# Patient Record
Sex: Female | Born: 1955
Health system: Southern US, Community
[De-identification: ages and names within clinical notes are randomized; demographics above are authoritative.]

## PROBLEM LIST (undated history)

## (undated) DIAGNOSIS — R112 Nausea with vomiting, unspecified: Secondary | ICD-10-CM

## (undated) DIAGNOSIS — Z9889 Other specified postprocedural states: Secondary | ICD-10-CM

## (undated) DIAGNOSIS — E559 Vitamin D deficiency, unspecified: Secondary | ICD-10-CM

## (undated) DIAGNOSIS — N393 Stress incontinence (female) (male): Secondary | ICD-10-CM

## (undated) DIAGNOSIS — J189 Pneumonia, unspecified organism: Secondary | ICD-10-CM

## (undated) DIAGNOSIS — F419 Anxiety disorder, unspecified: Secondary | ICD-10-CM

## (undated) DIAGNOSIS — Z8379 Family history of other diseases of the digestive system: Secondary | ICD-10-CM

## (undated) DIAGNOSIS — E785 Hyperlipidemia, unspecified: Secondary | ICD-10-CM

## (undated) DIAGNOSIS — Z923 Personal history of irradiation: Secondary | ICD-10-CM

## (undated) DIAGNOSIS — C801 Malignant (primary) neoplasm, unspecified: Secondary | ICD-10-CM

## (undated) DIAGNOSIS — Z853 Personal history of malignant neoplasm of breast: Secondary | ICD-10-CM

## (undated) HISTORY — DX: Hyperlipidemia, unspecified: E78.5

## (undated) HISTORY — DX: Anxiety disorder, unspecified: F41.9

## (undated) HISTORY — DX: Vitamin D deficiency, unspecified: E55.9

## (undated) HISTORY — DX: Malignant (primary) neoplasm, unspecified: C80.1

## (undated) HISTORY — PX: MENISCUS REPAIR: SHX5179

## (undated) HISTORY — DX: Pneumonia, unspecified organism: J18.9

## (undated) HISTORY — PX: BUNIONECTOMY: SHX129

---
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
## 2006-09-21 ENCOUNTER — Other Ambulatory Visit: Admission: RE | Admit: 2006-09-21 | Discharge: 2006-09-21 | Payer: Self-pay | Admitting: Family Medicine

## 2007-10-05 ENCOUNTER — Other Ambulatory Visit: Admission: RE | Admit: 2007-10-05 | Discharge: 2007-10-05 | Payer: Self-pay | Admitting: Family Medicine

## 2009-11-01 ENCOUNTER — Other Ambulatory Visit: Admission: RE | Admit: 2009-11-01 | Discharge: 2009-11-01 | Payer: Self-pay | Admitting: Family Medicine

## 2009-11-06 ENCOUNTER — Encounter: Admission: RE | Admit: 2009-11-06 | Discharge: 2009-11-06 | Payer: Self-pay | Admitting: Family Medicine

## 2009-12-02 ENCOUNTER — Encounter: Admission: RE | Admit: 2009-12-02 | Discharge: 2009-12-02 | Payer: Self-pay | Admitting: Family Medicine

## 2010-06-05 ENCOUNTER — Encounter: Admission: RE | Admit: 2010-06-05 | Discharge: 2010-06-05 | Payer: Self-pay | Admitting: Family Medicine

## 2010-08-17 DIAGNOSIS — Z923 Personal history of irradiation: Secondary | ICD-10-CM

## 2010-08-17 HISTORY — DX: Personal history of irradiation: Z92.3

## 2010-08-17 HISTORY — PX: BREAST LUMPECTOMY: SHX2

## 2010-11-11 ENCOUNTER — Other Ambulatory Visit: Payer: Self-pay | Admitting: Family Medicine

## 2010-11-11 DIAGNOSIS — R921 Mammographic calcification found on diagnostic imaging of breast: Secondary | ICD-10-CM

## 2010-11-13 ENCOUNTER — Ambulatory Visit
Admission: RE | Admit: 2010-11-13 | Discharge: 2010-11-13 | Disposition: A | Discharge status: Home / Self Care | Payer: 59 | Source: Ambulatory Visit | Attending: Family Medicine | Admitting: Family Medicine

## 2010-11-13 ENCOUNTER — Other Ambulatory Visit: Payer: Self-pay | Admitting: Family Medicine

## 2010-11-13 ENCOUNTER — Other Ambulatory Visit: Payer: Self-pay | Admitting: Diagnostic Radiology

## 2010-11-13 DIAGNOSIS — R921 Mammographic calcification found on diagnostic imaging of breast: Secondary | ICD-10-CM

## 2010-11-14 ENCOUNTER — Other Ambulatory Visit (HOSPITAL_COMMUNITY)
Admission: RE | Admit: 2010-11-14 | Discharge: 2010-11-14 | Disposition: A | Discharge status: Home / Self Care | Payer: Managed Care, Other (non HMO) | Source: Ambulatory Visit | Attending: Family Medicine | Admitting: Family Medicine

## 2010-11-14 ENCOUNTER — Other Ambulatory Visit: Payer: Self-pay | Admitting: Family Medicine

## 2010-11-14 ENCOUNTER — Other Ambulatory Visit: Payer: Self-pay | Admitting: Physician Assistant

## 2010-11-14 DIAGNOSIS — C50912 Malignant neoplasm of unspecified site of left female breast: Secondary | ICD-10-CM

## 2010-11-14 DIAGNOSIS — Z01419 Encounter for gynecological examination (general) (routine) without abnormal findings: Secondary | ICD-10-CM | POA: Insufficient documentation

## 2010-11-17 ENCOUNTER — Other Ambulatory Visit: Payer: Self-pay | Admitting: *Deleted

## 2010-11-17 DIAGNOSIS — C50912 Malignant neoplasm of unspecified site of left female breast: Secondary | ICD-10-CM

## 2010-11-18 ENCOUNTER — Other Ambulatory Visit (HOSPITAL_COMMUNITY): Payer: Self-pay | Admitting: General Surgery

## 2010-11-18 ENCOUNTER — Other Ambulatory Visit: Payer: Self-pay | Admitting: General Surgery

## 2010-11-18 DIAGNOSIS — C50912 Malignant neoplasm of unspecified site of left female breast: Secondary | ICD-10-CM

## 2010-11-19 ENCOUNTER — Ambulatory Visit
Admission: RE | Admit: 2010-11-19 | Discharge: 2010-11-19 | Disposition: A | Discharge status: Home / Self Care | Payer: 59 | Source: Ambulatory Visit | Attending: Family Medicine | Admitting: Family Medicine

## 2010-11-19 ENCOUNTER — Other Ambulatory Visit: Payer: Self-pay | Admitting: General Surgery

## 2010-11-19 DIAGNOSIS — C50912 Malignant neoplasm of unspecified site of left female breast: Secondary | ICD-10-CM

## 2010-11-19 MED ORDER — GADOBENATE DIMEGLUMINE 529 MG/ML IV SOLN
14.0000 mL | Freq: Once | INTRAVENOUS | Status: AC | PRN
  Administered 2010-11-19: 14 mL via INTRAVENOUS

## 2010-11-27 ENCOUNTER — Encounter (HOSPITAL_BASED_OUTPATIENT_CLINIC_OR_DEPARTMENT_OTHER)
Admission: RE | Admit: 2010-11-27 | Discharge: 2010-11-27 | Disposition: A | Discharge status: Home / Self Care | Payer: Managed Care, Other (non HMO) | Source: Ambulatory Visit | Attending: General Surgery | Admitting: General Surgery

## 2010-11-27 ENCOUNTER — Ambulatory Visit
Admission: RE | Admit: 2010-11-27 | Discharge: 2010-11-27 | Disposition: A | Discharge status: Home / Self Care | Payer: Managed Care, Other (non HMO) | Source: Ambulatory Visit | Attending: General Surgery | Admitting: General Surgery

## 2010-11-27 ENCOUNTER — Other Ambulatory Visit: Payer: Self-pay | Admitting: General Surgery

## 2010-11-27 DIAGNOSIS — Z01811 Encounter for preprocedural respiratory examination: Secondary | ICD-10-CM

## 2010-11-27 LAB — BASIC METABOLIC PANEL
Chloride: 106 mEq/L (ref 96–112)
GFR calc Af Amer: >60 mL/min (ref >60)
Potassium: 4.6 mEq/L (ref 3.5–5.1)
Sodium: 139 mEq/L (ref 135–145)

## 2010-11-27 LAB — CANCER ANTIGEN 27.29: CA 27.29: 9 U/mL (ref 0–39)

## 2010-12-02 ENCOUNTER — Ambulatory Visit
Admission: RE | Admit: 2010-12-02 | Discharge: 2010-12-02 | Disposition: A | Discharge status: Home / Self Care | Payer: Managed Care, Other (non HMO) | Source: Ambulatory Visit | Attending: General Surgery | Admitting: General Surgery

## 2010-12-02 ENCOUNTER — Other Ambulatory Visit: Payer: Self-pay | Admitting: General Surgery

## 2010-12-02 ENCOUNTER — Ambulatory Visit (HOSPITAL_BASED_OUTPATIENT_CLINIC_OR_DEPARTMENT_OTHER)
Admission: RE | Admit: 2010-12-02 | Discharge: 2010-12-02 | Disposition: A | Discharge status: Home / Self Care | Payer: Managed Care, Other (non HMO) | Source: Ambulatory Visit | Attending: General Surgery | Admitting: General Surgery

## 2010-12-02 ENCOUNTER — Ambulatory Visit (HOSPITAL_COMMUNITY)
Admission: RE | Admit: 2010-12-02 | Discharge: 2010-12-02 | Disposition: A | Discharge status: Home / Self Care | Payer: Managed Care, Other (non HMO) | Source: Ambulatory Visit | Attending: General Surgery | Admitting: General Surgery

## 2010-12-02 DIAGNOSIS — C50912 Malignant neoplasm of unspecified site of left female breast: Secondary | ICD-10-CM

## 2010-12-02 DIAGNOSIS — Z01812 Encounter for preprocedural laboratory examination: Secondary | ICD-10-CM | POA: Insufficient documentation

## 2010-12-02 DIAGNOSIS — C50919 Malignant neoplasm of unspecified site of unspecified female breast: Secondary | ICD-10-CM | POA: Insufficient documentation

## 2010-12-02 DIAGNOSIS — E119 Type 2 diabetes mellitus without complications: Secondary | ICD-10-CM | POA: Insufficient documentation

## 2010-12-02 DIAGNOSIS — D0590 Unspecified type of carcinoma in situ of unspecified breast: Secondary | ICD-10-CM | POA: Insufficient documentation

## 2010-12-02 HISTORY — PX: PARTIAL MASTECTOMY WITH AXILLARY SENTINEL LYMPH NODE BIOPSY: SHX6004

## 2010-12-02 LAB — GLUCOSE, CAPILLARY: Glucose-Capillary: 163 mg/dL — ABNORMAL HIGH (ref 70–99)

## 2010-12-02 MED ORDER — TECHNETIUM TC 99M SULFUR COLLOID FILTERED
1.0000 | Freq: Once | INTRAVENOUS | Status: AC | PRN
  Administered 2010-12-02: 1 via INTRADERMAL

## 2010-12-08 NOTE — Op Note (Signed)
NAMESOLE, Rebecca Robertson              ACCOUNT NO.:  [id number]  MEDICAL RECORD NO.:  [id number]           PATIENT TYPE:  O  LOCATION:  NUC                          FACILITY:  [facility]  PHYSICIAN:  Adolph Pollack, M.D.DATE OF BIRTH:  1955-12-04  DATE OF PROCEDURE:  12/02/2010 DATE OF DISCHARGE:                              OPERATIVE REPORT   PREOPERATIVE DIAGNOSIS:  High-grade ductal carcinoma in situ, left breast.  POSTOPERATIVE DIAGNOSIS:  High-grade ductal carcinoma in situ, left breast.  PROCEDURES: 1. Lymphatic mapping and injection of blue dye, left breast. 2. Left axillary sentinel lymph node biopsy (4 lymph nodes). 3. Left partial mastectomy after needle localization.  SURGEON:  Adolph Pollack, MD  ANESTHESIA:  General plus Marcaine local.  INDICATIONS:  Rebecca Robertson is a 55 year old female who had an abnormal screening mammogram demonstrating a suspicious cluster of microcalcifications in the left upper outer quadrant.  Stereotactic biopsy was performed and demonstrated high-grade ductal carcinoma in situ.  She now presents for the above procedure.  We discussed the procedure, risks, and aftercare preoperatively.  TECHNIQUE:  [facility] Mayford Knife and also had radioactive dye injection in the left nipple-areolar complex.  The left breast was marked with my initials. She was then brought to the operating room and placed supine on the operating table and general anesthetic was administered.  The bandage on the left breast was removed and the wire was cut closer to the skin.  I then sterilely prepped the periareolar area and injected 1-1.5 mL of methylene blue dye at 12, 3, 6, and 9 o'clock positions and massaged the breast for approximately 5 minutes.  Using a NeoProbe, I localized the site of increased counts and marked this with an X in the left axilla. Following this, the left breast, lateral chest  wall, axilla, and proximal left upper extremity were sterilely prepped and draped.  I made a curvilinear incision in the inferior axillary line on the left breast extending into the skin and subcutaneous tissue.  The clavipectoral fascia was divided.  I entered the axilla.  Using the NeoProbe, I identified the first lymph node that had increased counts and had blue dye and this was as sentinel lymph node #1.  This was dissected out bluntly and using electrocautery.  I then reinserted the NeoProbe into the left axilla and some area of increased counts using blunt dissection identified a second fairly large lymph node with some blue dye present and excised this with electrocautery as sentinel #2. Introduction of the NeoProbe into the left axilla demonstrated another area of increased counts and I dissected out a large node that was not blue but had an elevated radioactive counts and excised this using electrocautery and sent as sentinel #3.  I did find a final node also with elevated counts, excised with electrocautery and sent as sentinel lymph node #4.  Introduction of the NeoProbe into left axilla at this time demonstrated counts barely above background.  Following this, I inspected the wound and hemostasis was adequate. Local anesthetic was infiltrated superficially and deep consisting of 0.5% plain  Marcaine.  I then closed the wound 2 two layers.  The subcutaneous tissue was closed with interrupted 3-0 Vicryl sutures.  The skin was closed with 4-0 Monocryl subcuticular stitch.  Following this, I then approached the left breast and the wire.  The wire localization was done from the lateral aspect and I made a curvilinear incision in the lateral aspect of the left breast through the skin and subcutaneous tissue and then I brought the wire into the wound.  Using electrocautery, I performed a partial mastectomy taking a lump of tissue around the wire.  Once I had removed this, I  then oriented it with a single stitch anteriorly, 2 stitches superiorly, and the wire laterally.  Specimen mammogram was done demonstrating containment of the area of concern in the middle of the specimen.  This was confirmed by Dr. Mayford Knife.  All specimens were then sent to Pathology.  I then inspected the partial mastectomy wound and controlled bleeding with electrocautery.  I injected the Marcaine solution superficially and deep into the wound for local anesthetic effect.  I then closed the subcutaneous tissues with interrupted 3-0 Vicryl sutures.  The skin was closed with 4-0 Monocryl subcuticular stitch.  Steri-Strips and sterile dressings were applied.  She tolerated the procedure well without any apparent complications and was taken to the recovery room in satisfactory condition.     Adolph Pollack, M.D.     TJR/MEDQ  D:  12/02/2010  T:  12/03/2010  Job:  [id number]  cc:   Laurell Josephs, PA-C Heide Scales. Yolanda Bonine, MD Oliver Hum, MD  Electronically Signed by Avel Peace M.D. on 12/08/2010 02:20:12 PM

## 2010-12-09 ENCOUNTER — Other Ambulatory Visit (HOSPITAL_COMMUNITY): Payer: 59

## 2010-12-10 ENCOUNTER — Ambulatory Visit: Payer: Managed Care, Other (non HMO) | Attending: Radiation Oncology | Admitting: Radiation Oncology

## 2010-12-10 DIAGNOSIS — L539 Erythematous condition, unspecified: Secondary | ICD-10-CM | POA: Insufficient documentation

## 2010-12-10 DIAGNOSIS — Z510 Encounter for antineoplastic radiation therapy: Secondary | ICD-10-CM | POA: Insufficient documentation

## 2010-12-10 DIAGNOSIS — D0590 Unspecified type of carcinoma in situ of unspecified breast: Secondary | ICD-10-CM | POA: Insufficient documentation

## 2010-12-16 ENCOUNTER — Other Ambulatory Visit: Payer: Self-pay | Admitting: Oncology

## 2010-12-16 ENCOUNTER — Encounter (HOSPITAL_BASED_OUTPATIENT_CLINIC_OR_DEPARTMENT_OTHER): Payer: Managed Care, Other (non HMO) | Admitting: Oncology

## 2010-12-16 DIAGNOSIS — D0590 Unspecified type of carcinoma in situ of unspecified breast: Secondary | ICD-10-CM

## 2010-12-16 DIAGNOSIS — Z79899 Other long term (current) drug therapy: Secondary | ICD-10-CM

## 2010-12-16 DIAGNOSIS — E119 Type 2 diabetes mellitus without complications: Secondary | ICD-10-CM

## 2010-12-16 LAB — CBC WITH DIFFERENTIAL/PLATELET
BASO%: 0.4 % (ref 0.0–2.0)
Eosinophils Absolute: 0.1 10*3/uL (ref 0.0–0.5)
MONO#: 0.7 10*3/uL (ref 0.1–0.9)
MONO%: 7.1 % (ref 0.0–14.0)
NEUT#: 6 10*3/uL (ref 1.5–6.5)
RBC: 4.25 10*6/uL (ref 3.70–5.45)
RDW: 12.5 % (ref 11.2–14.5)
WBC: 9.8 10*3/uL (ref 3.9–10.3)

## 2010-12-16 LAB — COMPREHENSIVE METABOLIC PANEL
ALT: 30 U/L (ref 0–35)
Albumin: 3.7 g/dL (ref 3.5–5.2)
Alkaline Phosphatase: 67 U/L (ref 39–117)
CO2: 27 mEq/L (ref 19–32)
Glucose, Bld: 93 mg/dL (ref 70–99)
Potassium: 3.8 mEq/L (ref 3.5–5.3)
Sodium: 143 mEq/L (ref 135–145)
Total Protein: 7.1 g/dL (ref 6.0–8.3)

## 2011-01-19 ENCOUNTER — Other Ambulatory Visit: Payer: Self-pay | Admitting: General Surgery

## 2011-01-22 LAB — WOUND CULTURE

## 2011-01-29 ENCOUNTER — Encounter (INDEPENDENT_AMBULATORY_CARE_PROVIDER_SITE_OTHER): Payer: Self-pay | Admitting: General Surgery

## 2011-02-10 ENCOUNTER — Ambulatory Visit (INDEPENDENT_AMBULATORY_CARE_PROVIDER_SITE_OTHER): Payer: Managed Care, Other (non HMO) | Admitting: General Surgery

## 2011-02-10 ENCOUNTER — Encounter (INDEPENDENT_AMBULATORY_CARE_PROVIDER_SITE_OTHER): Payer: Self-pay | Admitting: General Surgery

## 2011-02-10 VITALS — BP 120/80 | HR 72 | Temp 96.6°F | Ht 64.0 in | Wt 155.0 lb

## 2011-02-10 DIAGNOSIS — D0510 Intraductal carcinoma in situ of unspecified breast: Secondary | ICD-10-CM

## 2011-02-10 DIAGNOSIS — D0512 Intraductal carcinoma in situ of left breast: Secondary | ICD-10-CM | POA: Insufficient documentation

## 2011-02-10 DIAGNOSIS — D0590 Unspecified type of carcinoma in situ of unspecified breast: Secondary | ICD-10-CM

## 2011-02-10 MED ORDER — HYDROXYZINE HCL 10 MG PO TABS: 10.0000 mg | ORAL_TABLET | Freq: Three times a day (TID) | ORAL | Status: AC | PRN

## 2011-02-10 NOTE — Progress Notes (Signed)
Subjective:     Patient ID: Rebecca Robertson, female   DOB: February 08, 1956, 55 y.o.   MRN: [id number]    BP 120/80  Pulse 72  Temp(Src) 96.6 F (35.9 C) (Temporal)  Ht 5\' 4"  (1.626 m)  Wt 155 lb (70.308 kg)  BMI 26.61 kg/m2    HPI She returns for another postoperative visit. She's developed a rash she thinks from her antibiotics which she stopped couple days ago. She will be finished with her radiation treatments next week. She's had some shooting pains in the left breast.  Review of Systems     Objective:   Physical Exam Left breast: Incision is clean. There are red radiation changes and firmness present throughout the entire breast. There is a punctate rash noted extending across the midline to the  right chest wall.    Assessment:       Left breast DCIS status post partial mastectomy and sentinel with the biopsy. Currently undergoing radiation therapy. He has developed a rash from her antibiotic. This was Bactrim. Plan:     Will prescribe hydroxyzine for rash. Followup with me in 6 weeks.

## 2011-02-19 ENCOUNTER — Encounter (INDEPENDENT_AMBULATORY_CARE_PROVIDER_SITE_OTHER): Payer: Self-pay | Admitting: General Surgery

## 2011-02-19 ENCOUNTER — Ambulatory Visit (INDEPENDENT_AMBULATORY_CARE_PROVIDER_SITE_OTHER): Payer: Managed Care, Other (non HMO) | Admitting: General Surgery

## 2011-02-19 VITALS — BP 118/82 | HR 68 | Temp 96.0°F | Ht 64.0 in | Wt 152.0 lb

## 2011-02-19 DIAGNOSIS — IMO0002 Reserved for concepts with insufficient information to code with codable children: Secondary | ICD-10-CM | POA: Insufficient documentation

## 2011-02-19 NOTE — Patient Instructions (Signed)
Change bandage daily and apply Neosporin to incision site daily for one week.

## 2011-02-19 NOTE — Progress Notes (Signed)
She completed her XRT on 02/17/11 and was doing well until this morning when she noticed a copious amount of brownish fluid in her bed and on her top.  She called and an appointment was arranged for her today.  Pain is improving.  PE:  Gen:  NAD  Left Breast:  Decreased redness and swelling is noted; no drainage from lateral scar.  [place] of Left Breast:  Fluid collection noted lateral to incision; 15cc of brown nonpurulent fluid evacuated; Neosporin and dry, bulky dressing applied.  Assessment:  S/p left partial mastectomy and XRT with spontaneous seroma drainage.  Plan:  Remove bandage tomorrow and apply Neosporin and dry dressing to incision site daily for one week.  Return visit in early August 2012.

## 2011-02-21 ENCOUNTER — Telehealth (INDEPENDENT_AMBULATORY_CARE_PROVIDER_SITE_OTHER): Payer: Self-pay | Admitting: Surgery

## 2011-02-21 NOTE — Telephone Encounter (Signed)
Pt called. Hx of lumpectomy,  Rad tx and mastitis with seroma.  Seen 2 days ago.  Today,  Yellow drainage from incision noted with increased pain.  No fever chills.  Breast red sore but has been that way since radiation. Recommended patient come to ED for evaluation.  She states she has no money and cannot afford this.  I explained that over the phone I could not be sure what to offer.  I discussed starting abx with her and having her return on mon to be seen in office.  She will do that.   f she feels worse she will go to ED.  Called in doxycycline 100 mg po bid.  #20 to [facility] [phone].  Rec keeping a pad on it since it is already draining.  She will call back as needed.

## 2011-02-23 ENCOUNTER — Encounter (INDEPENDENT_AMBULATORY_CARE_PROVIDER_SITE_OTHER): Payer: Self-pay | Admitting: Surgery

## 2011-02-23 ENCOUNTER — Telehealth (INDEPENDENT_AMBULATORY_CARE_PROVIDER_SITE_OTHER): Payer: Self-pay

## 2011-02-23 ENCOUNTER — Ambulatory Visit (INDEPENDENT_AMBULATORY_CARE_PROVIDER_SITE_OTHER): Payer: Managed Care, Other (non HMO) | Admitting: Surgery

## 2011-02-23 VITALS — Temp 97.5°F

## 2011-02-23 DIAGNOSIS — IMO0002 Reserved for concepts with insufficient information to code with codable children: Secondary | ICD-10-CM

## 2011-02-23 NOTE — Progress Notes (Signed)
Subjective:     Patient ID: Rebecca Robertson, female   DOB: 05-May-1956, 55 y.o.   MRN: [id number]    Temp(Src) 97.5 F (36.4 C) (Temporal)    HPI  Patient of Dr. Abbey Chatters status post lumpectomy and is currently receiving radiation therapy. He had to drain the seroma last week. She had to be placed on doxycycline this weekend for odor from the wound. Since taking the antibiotic, she is feeling better. Review of Systems     Objective:   Physical Exam    On exam, the breast is erythematous which I suspect is from radiation. The open wound is draining serous fluid without odorAssessment:        Plan:       Impression, infected seroma. She will continued antibiotics and wound care. She will followup next week.

## 2011-02-24 ENCOUNTER — Other Ambulatory Visit: Payer: Self-pay | Admitting: Oncology

## 2011-02-24 ENCOUNTER — Encounter (HOSPITAL_BASED_OUTPATIENT_CLINIC_OR_DEPARTMENT_OTHER): Payer: Managed Care, Other (non HMO) | Admitting: Oncology

## 2011-02-24 DIAGNOSIS — E119 Type 2 diabetes mellitus without complications: Secondary | ICD-10-CM

## 2011-02-24 DIAGNOSIS — D0590 Unspecified type of carcinoma in situ of unspecified breast: Secondary | ICD-10-CM

## 2011-02-24 DIAGNOSIS — Z79899 Other long term (current) drug therapy: Secondary | ICD-10-CM

## 2011-02-24 DIAGNOSIS — C50419 Malignant neoplasm of upper-outer quadrant of unspecified female breast: Secondary | ICD-10-CM

## 2011-02-24 LAB — CBC WITH DIFFERENTIAL/PLATELET
BASO%: 0.4 % (ref 0.0–2.0)
EOS%: 2.1 % (ref 0.0–7.0)
HCT: 33.6 % — ABNORMAL LOW (ref 34.8–46.6)
HGB: 11.4 g/dL — ABNORMAL LOW (ref 11.6–15.9)
MCHC: 34 g/dL (ref 31.5–36.0)
MONO#: 0.5 10*3/uL (ref 0.1–0.9)
NEUT%: 60.6 % (ref 38.4–76.8)
RDW: 13.4 % (ref 11.2–14.5)
WBC: 4.9 10*3/uL (ref 3.9–10.3)
lymph#: 1.3 10*3/uL (ref 0.9–3.3)

## 2011-02-24 LAB — COMPREHENSIVE METABOLIC PANEL
Alkaline Phosphatase: 88 U/L (ref 39–117)
Glucose, Bld: 163 mg/dL — ABNORMAL HIGH (ref 70–99)
Sodium: 139 mEq/L (ref 135–145)
Total Bilirubin: 0.3 mg/dL (ref 0.3–1.2)
Total Protein: 7.4 g/dL (ref 6.0–8.3)

## 2011-03-02 ENCOUNTER — Encounter (INDEPENDENT_AMBULATORY_CARE_PROVIDER_SITE_OTHER): Payer: Self-pay | Admitting: General Surgery

## 2011-03-02 ENCOUNTER — Other Ambulatory Visit (INDEPENDENT_AMBULATORY_CARE_PROVIDER_SITE_OTHER): Payer: Self-pay

## 2011-03-02 ENCOUNTER — Ambulatory Visit (INDEPENDENT_AMBULATORY_CARE_PROVIDER_SITE_OTHER): Payer: Managed Care, Other (non HMO) | Admitting: General Surgery

## 2011-03-02 DIAGNOSIS — IMO0002 Reserved for concepts with insufficient information to code with codable children: Secondary | ICD-10-CM

## 2011-03-02 NOTE — Progress Notes (Signed)
She is here for another postoperative visit. She states her breast feels better. There days when it doesn't drain muchJoyce a slight amount. There is no odor to the drainage. Dr. Magnus Ivan has put her on some doxycycline and she's been on it for 2 weeks.  On exam the left breast incision has a little opening with a little bit of drainage from the but it's not purulent. Erythema is much better.  Assessment: Left breast infection that appears to be improving. The drainage is less than  Plan: Continue doxycycline for 6 weeks. Return visit 4 weeks.

## 2011-03-11 ENCOUNTER — Ambulatory Visit
Admission: RE | Admit: 2011-03-11 | Discharge: 2011-03-11 | Disposition: A | Discharge status: Home / Self Care | Payer: Managed Care, Other (non HMO) | Source: Ambulatory Visit | Attending: Radiation Oncology | Admitting: Radiation Oncology

## 2011-03-19 ENCOUNTER — Encounter (HOSPITAL_BASED_OUTPATIENT_CLINIC_OR_DEPARTMENT_OTHER): Payer: Managed Care, Other (non HMO)

## 2011-03-19 ENCOUNTER — Other Ambulatory Visit: Payer: Self-pay | Admitting: Oncology

## 2011-03-19 DIAGNOSIS — Z79899 Other long term (current) drug therapy: Secondary | ICD-10-CM

## 2011-03-19 DIAGNOSIS — E119 Type 2 diabetes mellitus without complications: Secondary | ICD-10-CM

## 2011-03-19 DIAGNOSIS — C50419 Malignant neoplasm of upper-outer quadrant of unspecified female breast: Secondary | ICD-10-CM

## 2011-03-19 DIAGNOSIS — D0590 Unspecified type of carcinoma in situ of unspecified breast: Secondary | ICD-10-CM

## 2011-03-19 LAB — CBC WITH DIFFERENTIAL (CANCER CENTER ONLY)
BASO#: 0 10*3/uL (ref 0.0–0.2)
Eosinophils Absolute: 0.1 10*3/uL (ref 0.0–0.5)
LYMPH%: 31 % (ref 14.0–48.0)
MCH: 30.4 pg (ref 26.0–34.0)
MCV: 88 fL (ref 81–101)
MONO%: 13.1 % — ABNORMAL HIGH (ref 0.0–13.0)
NEUT%: 54.1 % (ref 39.6–80.0)
Platelets: 230 10*3/uL (ref 145–400)
RBC: 4.25 10*6/uL (ref 3.70–5.32)

## 2011-03-19 LAB — BASIC METABOLIC PANEL
BUN: 14 mg/dL (ref 6–23)
Calcium: 9.9 mg/dL (ref 8.4–10.5)
Creatinine, Ser: 0.74 mg/dL (ref 0.50–1.10)
Glucose, Bld: 126 mg/dL — ABNORMAL HIGH (ref 70–99)
Sodium: 141 mEq/L (ref 135–145)

## 2011-03-20 ENCOUNTER — Encounter (INDEPENDENT_AMBULATORY_CARE_PROVIDER_SITE_OTHER): Payer: Managed Care, Other (non HMO) | Admitting: General Surgery

## 2011-03-25 ENCOUNTER — Encounter (INDEPENDENT_AMBULATORY_CARE_PROVIDER_SITE_OTHER): Payer: Self-pay

## 2011-03-30 ENCOUNTER — Ambulatory Visit (INDEPENDENT_AMBULATORY_CARE_PROVIDER_SITE_OTHER): Payer: Commercial Indemnity | Admitting: General Surgery

## 2011-03-30 ENCOUNTER — Encounter (INDEPENDENT_AMBULATORY_CARE_PROVIDER_SITE_OTHER): Payer: Self-pay | Admitting: General Surgery

## 2011-03-30 DIAGNOSIS — T8140XA Infection following a procedure, unspecified, initial encounter: Secondary | ICD-10-CM

## 2011-03-30 DIAGNOSIS — T8149XA Infection following a procedure, other surgical site, initial encounter: Secondary | ICD-10-CM | POA: Insufficient documentation

## 2011-03-30 NOTE — Progress Notes (Signed)
She is here for another wound check. She is almost finished with her antibiotics. Still having some drainage from the wound.  Exam-left breast wound has some redness and little bit of swelling around a central area.  I put pressure on this and got a little bit of non-purulent drainage as well some tissue from it. I cleaned this area with Betadine and draped Xylocaine for local anesthetic affect. An elliptical incision was made through the skin and subcutaneous tissue. A moderate amount of necrotic tissue was debrided and a 3 cm cavity was noted.  The wound is packed with saline moistened gauze followed by dry dressing. She and her daughter were taught how to do this.  Assessment: Left breast wound infection with some necrotic tissue deep to this. This has been incised and debrided.  Plan: Normal saline wet to dry dressing change daily. Return visit one week.

## 2011-03-30 NOTE — Patient Instructions (Signed)
Normal saline wet to dry dressing change as instructed everyday. Call if you have problems with this.

## 2011-03-31 ENCOUNTER — Other Ambulatory Visit (INDEPENDENT_AMBULATORY_CARE_PROVIDER_SITE_OTHER): Payer: Self-pay

## 2011-04-07 ENCOUNTER — Ambulatory Visit (INDEPENDENT_AMBULATORY_CARE_PROVIDER_SITE_OTHER): Payer: Commercial Indemnity | Admitting: General Surgery

## 2011-04-07 ENCOUNTER — Encounter (INDEPENDENT_AMBULATORY_CARE_PROVIDER_SITE_OTHER): Payer: Self-pay | Admitting: General Surgery

## 2011-04-07 VITALS — Temp 96.8°F

## 2011-04-07 DIAGNOSIS — T8149XA Infection following a procedure, other surgical site, initial encounter: Secondary | ICD-10-CM

## 2011-04-07 DIAGNOSIS — T8140XA Infection following a procedure, unspecified, initial encounter: Secondary | ICD-10-CM

## 2011-04-07 NOTE — Progress Notes (Signed)
She is here for followup of her delayed wound infection of the left breast. The wound was partially opened last time she was here and she is tolerating the dressing changes well feels better.  Exam: Left breast-periwound is clean; no purulent drainage; and measures 2 cm deep; no surrounding erythema.  Assessment: Delayed wound infection left breast that is healing by secondary intention.  Plan: Supplemental vitamin A, vitamin C, and zinc. Continue dressing changes. Return visit 3 weeks.

## 2011-04-07 NOTE — Patient Instructions (Signed)
Take vitamin A, vitamin C, and zinc daily.

## 2011-04-13 ENCOUNTER — Ambulatory Visit
Admission: RE | Admit: 2011-04-13 | Discharge: 2011-04-13 | Disposition: A | Discharge status: Home / Self Care | Payer: Managed Care, Other (non HMO) | Source: Ambulatory Visit | Attending: Radiation Oncology | Admitting: Radiation Oncology

## 2011-04-15 ENCOUNTER — Ambulatory Visit (INDEPENDENT_AMBULATORY_CARE_PROVIDER_SITE_OTHER): Payer: Commercial Indemnity | Admitting: General Surgery

## 2011-04-15 ENCOUNTER — Encounter (INDEPENDENT_AMBULATORY_CARE_PROVIDER_SITE_OTHER): Payer: Self-pay | Admitting: General Surgery

## 2011-04-15 VITALS — BP 126/84 | HR 60 | Temp 96.4°F

## 2011-04-15 DIAGNOSIS — T8140XA Infection following a procedure, unspecified, initial encounter: Secondary | ICD-10-CM

## 2011-04-15 DIAGNOSIS — T8149XA Infection following a procedure, other surgical site, initial encounter: Secondary | ICD-10-CM

## 2011-04-15 NOTE — Progress Notes (Signed)
She is concerned because she says some yellow and greenish drainage from her left breast wound. It smelled bad at times. No increase in pain.  Exam: Left breast-open wound is present with some yellowish green drainage on the gauze; minimal order; wound bed is clean with good granulation tissue; no cellulitis.  Assessment: No evidence of recurrent infection. Not unusual to have this character with respect to the drainage.  Plan: Continue current wound care. Keep September appointment.

## 2011-04-27 ENCOUNTER — Ambulatory Visit (INDEPENDENT_AMBULATORY_CARE_PROVIDER_SITE_OTHER): Payer: Commercial Indemnity | Admitting: General Surgery

## 2011-04-27 DIAGNOSIS — T8140XA Infection following a procedure, unspecified, initial encounter: Secondary | ICD-10-CM

## 2011-04-27 NOTE — Progress Notes (Signed)
She is here for another wound check. She is feeling much better. She states the wound is closing up nicely. Still having a small amount of drainage.  Exam left breast-in the upper outer quadrant there is a small wound that is very shallow with minimal drainage; no erythema.  Assessment: Status post left partial mastectomy with wound infection-slowly improving.  Plan: Continue wound care. Return visit 4 weeks.  She does have some breast asymmetry she will discuss a plastic surgery consultation with her at a later date.

## 2011-05-12 ENCOUNTER — Other Ambulatory Visit: Payer: Self-pay | Admitting: Family Medicine

## 2011-05-12 DIAGNOSIS — R911 Solitary pulmonary nodule: Secondary | ICD-10-CM

## 2011-05-13 ENCOUNTER — Ambulatory Visit
Admission: RE | Admit: 2011-05-13 | Discharge: 2011-05-13 | Disposition: A | Discharge status: Home / Self Care | Payer: Managed Care, Other (non HMO) | Source: Ambulatory Visit | Attending: Family Medicine | Admitting: Family Medicine

## 2011-05-13 DIAGNOSIS — R911 Solitary pulmonary nodule: Secondary | ICD-10-CM

## 2011-06-02 ENCOUNTER — Ambulatory Visit (INDEPENDENT_AMBULATORY_CARE_PROVIDER_SITE_OTHER): Payer: 59 | Admitting: General Surgery

## 2011-06-02 ENCOUNTER — Encounter (INDEPENDENT_AMBULATORY_CARE_PROVIDER_SITE_OTHER): Payer: Self-pay | Admitting: General Surgery

## 2011-06-02 VITALS — BP 120/80 | HR 60 | Temp 97.1°F | Resp 18 | Ht 64.0 in | Wt 155.2 lb

## 2011-06-02 DIAGNOSIS — C50919 Malignant neoplasm of unspecified site of unspecified female breast: Secondary | ICD-10-CM

## 2011-06-02 DIAGNOSIS — C50912 Malignant neoplasm of unspecified site of left female breast: Secondary | ICD-10-CM

## 2011-06-02 NOTE — Progress Notes (Signed)
Rebecca Robertson states the wound has healed.  Exam left breast-in the upper outer quadrant there is a uoq scar with some indentation and firmness; no drainage is present.  Assessment: Status post left partial mastectomy with wound infection-resolved and wound has healed.  Plan: Return visit in 3 months.  Will discuss possible referral to plastic surgeon sometime next year.

## 2011-06-29 ENCOUNTER — Other Ambulatory Visit: Payer: Self-pay | Admitting: Oncology

## 2011-06-29 ENCOUNTER — Other Ambulatory Visit (HOSPITAL_BASED_OUTPATIENT_CLINIC_OR_DEPARTMENT_OTHER): Payer: Self-pay | Admitting: Lab

## 2011-06-29 ENCOUNTER — Telehealth: Payer: Self-pay | Admitting: Oncology

## 2011-06-29 ENCOUNTER — Ambulatory Visit: Payer: Self-pay | Admitting: Oncology

## 2011-06-29 DIAGNOSIS — D0590 Unspecified type of carcinoma in situ of unspecified breast: Secondary | ICD-10-CM

## 2011-06-29 DIAGNOSIS — C50419 Malignant neoplasm of upper-outer quadrant of unspecified female breast: Secondary | ICD-10-CM

## 2011-06-29 DIAGNOSIS — Z79899 Other long term (current) drug therapy: Secondary | ICD-10-CM

## 2011-06-29 DIAGNOSIS — E119 Type 2 diabetes mellitus without complications: Secondary | ICD-10-CM

## 2011-06-29 LAB — CBC WITH DIFFERENTIAL/PLATELET
Basophils Absolute: 0 10*3/uL (ref 0.0–0.1)
EOS%: 2.9 % (ref 0.0–7.0)
Eosinophils Absolute: 0.2 10*3/uL (ref 0.0–0.5)
HCT: 36.6 % (ref 34.8–46.6)
HGB: 12.4 g/dL (ref 11.6–15.9)
MCH: 30.5 pg (ref 25.1–34.0)
MCV: 89.8 fL (ref 79.5–101.0)
MONO%: 8.3 % (ref 0.0–14.0)
NEUT#: 3.2 10*3/uL (ref 1.5–6.5)
NEUT%: 56.5 % (ref 38.4–76.8)
Platelets: 193 10*3/uL (ref 145–400)
RDW: 13.8 % (ref 11.2–14.5)

## 2011-06-29 LAB — COMPREHENSIVE METABOLIC PANEL
AST: 17 U/L (ref 0–37)
Albumin: 3.8 g/dL (ref 3.5–5.2)
Alkaline Phosphatase: 68 U/L (ref 39–117)
BUN: 11 mg/dL (ref 6–23)
Calcium: 9.5 mg/dL (ref 8.4–10.5)
Creatinine, Ser: 0.53 mg/dL (ref 0.50–1.10)
Glucose, Bld: 129 mg/dL — ABNORMAL HIGH (ref 70–99)
Potassium: 4.7 mEq/L (ref 3.5–5.3)

## 2011-06-29 NOTE — Telephone Encounter (Signed)
called pt and r/s her appt on 11/12 to 11/19

## 2011-07-06 ENCOUNTER — Encounter: Payer: Self-pay | Admitting: Oncology

## 2011-07-06 ENCOUNTER — Ambulatory Visit (HOSPITAL_BASED_OUTPATIENT_CLINIC_OR_DEPARTMENT_OTHER): Payer: Self-pay | Admitting: Oncology

## 2011-07-06 ENCOUNTER — Telehealth: Payer: Self-pay | Admitting: Oncology

## 2011-07-06 VITALS — BP 145/79 | HR 84 | Temp 98.2°F | Ht 64.5 in | Wt 162.0 lb

## 2011-07-06 DIAGNOSIS — D0590 Unspecified type of carcinoma in situ of unspecified breast: Secondary | ICD-10-CM

## 2011-07-06 NOTE — Telephone Encounter (Signed)
Gv pt appt for [id number] 

## 2011-07-06 NOTE — Progress Notes (Signed)
OFFICE PROGRESS NOTE  CC Vick Frees or M.D.  Dorothy Puffer M.D. PhD  [place], [place], [place] [address] [place] [address]  DIAGNOSIS: 55 year old female with ductal carcinoma in situ of the left breast ( stage 0)  PRIOR THERAPY:  #1 dose postlumpectomy after she was found to have clusters of microcalcifications within the upper outer quadrant of the left breast final pathology revealed a high grade ductal carcinoma in situ with 4 sentinel nodes that were negative for metastatic disease. Her surgery was performed on 12/02/2010.  #2 she is now status post adjuvant radiation therapy administered from 12/25/2010 2 02/17/2011 2  #3 she also developed mastitis of the left breast she received oral antibiotics as well as anti-inflammatories.  #4 she was then begun on tamoxifen 20 mg daily starting in August 2012.  CURRENT THERAPY: Current therapy 20 mg daily since August 2012.  INTERVAL HISTORY: Rebecca Robertson 55 y.o. female returns for followup visit today. She is tolerating the tamoxifen well she however is having hot flashes. She otherwise denies any fevers chills night sweats headaches shortness of breath chest pains palpitations she has no myalgias or arthralgias she has no vaginal discharge no tenderness in her calves or swelling to her remainder of the 10 point review of systems is negative.  MEDICAL HISTORY: Past Medical History  Diagnosis Date  . Diabetes mellitus   . Allergy   . Cellulitis     left  . Open breast wound     draining and abnormal odor   . Cancer     L breast    ALLERGIES:  is allergic to compazine.  MEDICATIONS:  Current Outpatient Prescriptions  Medication Sig Dispense Refill  . cetirizine (ZYRTEC) 10 MG tablet Take 10 mg by mouth daily.        Marland Kitchen doxycycline (DORYX) 100 MG DR capsule Take 100 mg by mouth 2 (two) times daily.       . fish oil-omega-3 fatty acids 1000 MG capsule Take 2 g by mouth daily.        Marland Kitchen glipiZIDE (GLUCOTROL)  10 MG tablet Take 10 mg by mouth 2 (two) times daily before a meal.        . metFORMIN (GLUCOPHAGE) 500 MG tablet Take 500 mg by mouth 2 (two) times daily with a meal.          SURGICAL HISTORY:  Past Surgical History  Procedure Date  . Cesarean section     4  . Bunionectomy   . Breast surgery     Left partial mastectomy    REVIEW OF SYSTEMS:  Pertinent items are noted in HPI.   PHYSICAL EXAMINATION: General appearance: alert and cooperative Neck: no adenopathy, no carotid bruit, no JVD, supple, symmetrical, trachea midline and thyroid not enlarged, symmetric, no tenderness/mass/nodules Lymph nodes: Cervical, supraclavicular, and axillary nodes normal. Resp: clear to auscultation bilaterally and normal percussion bilaterally Back: symmetric, no curvature. ROM normal. No CVA tenderness. Cardio: regular rate and rhythm, S1, S2 normal, no murmur, click, rub or gallop and normal apical impulse GI: soft, non-tender; bowel sounds normal; no masses,  no organomegaly Extremities: extremities normal, atraumatic, no cyanosis or edema Neurologic: Alert and oriented X 3, normal strength and tone. Normal symmetric reflexes. Normal coordination and gait Sensory: normal Motor: grossly normal Reflexes: 2+ and symmetric Bilateral breasts are examined. The left breast reveals well-healed surgical scar no nodularity no nipple retraction. Right breast no masses or nipple discharge.  ECOG PERFORMANCE STATUS: 0 - Asymptomatic  Blood pressure 145/79, pulse 84, temperature 98.2 F (36.8 C), temperature source Oral, height 5' 4.5" (1.638 m), weight 162 lb (73.483 kg).  LABORATORY DATA: Lab Results  Component Value Date   WBC 5.6 06/29/2011   HGB 12.4 06/29/2011   HCT 36.6 06/29/2011   MCV 89.8 06/29/2011   PLT 193 06/29/2011      Chemistry      Component Value Date/Time   NA 140 06/29/2011 1007   NA 140 06/29/2011 1007   K 4.7 06/29/2011 1007   K 4.7 06/29/2011 1007   CL 104 06/29/2011  1007   CL 104 06/29/2011 1007   CO2 27 06/29/2011 1007   CO2 27 06/29/2011 1007   BUN 11 06/29/2011 1007   BUN 11 06/29/2011 1007   CREATININE 0.53 06/29/2011 1007   CREATININE 0.53 06/29/2011 1007      Component Value Date/Time   CALCIUM 9.5 06/29/2011 1007   CALCIUM 9.5 06/29/2011 1007   ALKPHOS 68 06/29/2011 1007   ALKPHOS 68 06/29/2011 1007   AST 17 06/29/2011 1007   AST 17 06/29/2011 1007   ALT 21 06/29/2011 1007   ALT 21 06/29/2011 1007   BILITOT 0.3 06/29/2011 1007   BILITOT 0.3 06/29/2011 1007        ASSESSMENT: 55 year old female with DCIS clinical stage 0 breast cancer. Originally diagnosed in April 2012 when she underwent her surgery. Overall she is doing well. She is on tamoxifen since August 2012 and has very minimal side effects.   PLAN: I will continue to see her every 6 months at this time. She knows to call me with any problems questions or concerns. Patient is extensively counseled about healthy eating and lifestyle.   All questions were answered. The patient knows to call the clinic with any problems, questions or concerns. We can certainly see the patient much sooner if necessary.  I spent 20 minutes counseling the patient face to face. The total time spent in the appointment was 30 minutes.    Drue Second, MD Medical/Oncology [facility] [phone] (beeper) [phone] (Office)  07/06/2011, 4:33 PM

## 2011-07-17 ENCOUNTER — Ambulatory Visit: Payer: Self-pay | Admitting: Radiation Oncology

## 2011-08-31 ENCOUNTER — Encounter (INDEPENDENT_AMBULATORY_CARE_PROVIDER_SITE_OTHER): Payer: Self-pay | Admitting: General Surgery

## 2011-08-31 ENCOUNTER — Ambulatory Visit (INDEPENDENT_AMBULATORY_CARE_PROVIDER_SITE_OTHER): Payer: 59 | Admitting: General Surgery

## 2011-08-31 VITALS — BP 138/86 | HR 76 | Temp 97.8°F | Resp 16 | Ht 64.0 in | Wt 163.2 lb

## 2011-08-31 DIAGNOSIS — Z853 Personal history of malignant neoplasm of breast: Secondary | ICD-10-CM

## 2011-08-31 NOTE — Patient Instructions (Signed)
Use Mederma cream (OTC) on your scar and massage the area two to three times a day.

## 2011-08-31 NOTE — Progress Notes (Signed)
Operation: Left partial mastectomy and left axillary sentinel lymph node biopsy for DCIS  Date: December 02, 2010  Stage: DCIS  Hormone receptor status: Positive  HPI: She is here for long-term follow her for DCIS of the left breast. She denies any new masses in either breast. She still little concerned about the physical appearance of the scar on the left breast. No adenopathy.    PE: Gen. A.-well-developed well-nourished in no acute distress.  Right breast-soft with no dominant masses, suspicious skin changes, nipple discharge.   Left breast-well-healed upper outer quadrant scar were some indentation and some breast asymmetry; no palpable masses.  Lymph nodes-no palpable cervical, supraclavicular, or axillary adenopathy.  Assessment:  DCIS right breast status post right partial mastectomy-the scar actually looks better to me today and there is no clinical evidence of recurrence.  Plan: Start  Mederma and massage therapy to scar area. Return visit 3 months. If breast asymmetry is still present will refer her to a Engineer, petroleum.

## 2011-09-08 ENCOUNTER — Other Ambulatory Visit (INDEPENDENT_AMBULATORY_CARE_PROVIDER_SITE_OTHER): Payer: Self-pay | Admitting: General Surgery

## 2011-09-08 ENCOUNTER — Ambulatory Visit (INDEPENDENT_AMBULATORY_CARE_PROVIDER_SITE_OTHER): Payer: 59

## 2011-09-08 DIAGNOSIS — Z853 Personal history of malignant neoplasm of breast: Secondary | ICD-10-CM

## 2011-09-08 DIAGNOSIS — S335XXA Sprain of ligaments of lumbar spine, initial encounter: Secondary | ICD-10-CM

## 2011-09-08 DIAGNOSIS — B9789 Other viral agents as the cause of diseases classified elsewhere: Secondary | ICD-10-CM

## 2011-09-08 DIAGNOSIS — L988 Other specified disorders of the skin and subcutaneous tissue: Secondary | ICD-10-CM

## 2011-11-06 ENCOUNTER — Ambulatory Visit
Admission: RE | Admit: 2011-11-06 | Discharge: 2011-11-06 | Disposition: A | Discharge status: Home / Self Care | Payer: Self-pay | Source: Ambulatory Visit | Attending: General Surgery | Admitting: General Surgery

## 2011-11-06 DIAGNOSIS — Z853 Personal history of malignant neoplasm of breast: Secondary | ICD-10-CM

## 2011-11-18 ENCOUNTER — Other Ambulatory Visit: Payer: Self-pay | Admitting: Physician Assistant

## 2011-11-26 ENCOUNTER — Ambulatory Visit (INDEPENDENT_AMBULATORY_CARE_PROVIDER_SITE_OTHER): Payer: 59 | Admitting: General Surgery

## 2011-11-26 ENCOUNTER — Encounter (INDEPENDENT_AMBULATORY_CARE_PROVIDER_SITE_OTHER): Payer: Self-pay | Admitting: General Surgery

## 2011-11-26 VITALS — BP 126/74 | HR 88 | Temp 97.6°F | Resp 16 | Ht 63.0 in | Wt 162.8 lb

## 2011-11-26 DIAGNOSIS — Z853 Personal history of malignant neoplasm of breast: Secondary | ICD-10-CM

## 2011-11-26 NOTE — Progress Notes (Signed)
Operation: Left partial mastectomy and left axillary sentinel lymph node biopsy for DCIS  Date: December 02, 2010  Stage: DCIS  Hormone receptor status: Positive  HPI: She is here for long-term follow her for DCIS of the left breast. She denies any new masses in either breast.  No adenopathy.  No significant change in the left breast asymmetry.  Mammogram was a BIRADS 2.   PE: Gen. A.-well-developed well-nourished in no acute distress.  Right breast-soft with no dominant masses, suspicious skin changes, nipple discharge.   Left breast-well-healed upper outer quadrant scar with firmness and  Indentation.  Lymph nodes-no palpable cervical, supraclavicular, or axillary adenopathy.  Assessment:  DCIS right breast status post right partial mastectomy-asymmetry remains; no clinical or radiographic evidence of recurrence.  Plan:   Plastic surgery referral.  RTC 3 months.

## 2011-11-26 NOTE — Patient Instructions (Signed)
Call if you feel any new masses in your breast. 

## 2012-01-04 ENCOUNTER — Telehealth: Payer: Self-pay | Admitting: *Deleted

## 2012-01-04 ENCOUNTER — Encounter: Payer: Self-pay | Admitting: Oncology

## 2012-01-04 ENCOUNTER — Ambulatory Visit (HOSPITAL_BASED_OUTPATIENT_CLINIC_OR_DEPARTMENT_OTHER): Payer: 59 | Admitting: Oncology

## 2012-01-04 ENCOUNTER — Other Ambulatory Visit (HOSPITAL_BASED_OUTPATIENT_CLINIC_OR_DEPARTMENT_OTHER): Payer: 59 | Admitting: Lab

## 2012-01-04 VITALS — BP 126/76 | HR 76 | Temp 98.1°F | Ht 63.0 in | Wt 161.6 lb

## 2012-01-04 DIAGNOSIS — D0590 Unspecified type of carcinoma in situ of unspecified breast: Secondary | ICD-10-CM

## 2012-01-04 DIAGNOSIS — D0510 Intraductal carcinoma in situ of unspecified breast: Secondary | ICD-10-CM

## 2012-01-04 DIAGNOSIS — F411 Generalized anxiety disorder: Secondary | ICD-10-CM

## 2012-01-04 LAB — COMPREHENSIVE METABOLIC PANEL
Albumin: 3.4 g/dL — ABNORMAL LOW (ref 3.5–5.2)
BUN: 15 mg/dL (ref 6–23)
CO2: 26 mEq/L (ref 19–32)
Calcium: 9 mg/dL (ref 8.4–10.5)
Chloride: 106 mEq/L (ref 96–112)
Glucose, Bld: 125 mg/dL — ABNORMAL HIGH (ref 70–99)
Potassium: 3.7 mEq/L (ref 3.5–5.3)
Sodium: 141 mEq/L (ref 135–145)
Total Protein: 6.7 g/dL (ref 6.0–8.3)

## 2012-01-04 LAB — CBC WITH DIFFERENTIAL/PLATELET
Basophils Absolute: 0 10*3/uL (ref 0.0–0.1)
Eosinophils Absolute: 0.2 10*3/uL (ref 0.0–0.5)
HCT: 34.7 % — ABNORMAL LOW (ref 34.8–46.6)
HGB: 11.4 g/dL — ABNORMAL LOW (ref 11.6–15.9)
MCH: 30.9 pg (ref 25.1–34.0)
MCV: 93.8 fL (ref 79.5–101.0)
NEUT#: 2.2 10*3/uL (ref 1.5–6.5)
NEUT%: 45.1 % (ref 38.4–76.8)
RDW: 13.1 % (ref 11.2–14.5)
lymph#: 2 10*3/uL (ref 0.9–3.3)

## 2012-01-04 NOTE — Telephone Encounter (Signed)
gave patient appointment for 07-08-2012 starting at 3:30pm with labs printed out calendar and gave to the patient

## 2012-01-04 NOTE — Progress Notes (Signed)
OFFICE PROGRESS NOTE  CC Vick Frees or M.D.  Dorothy Puffer M.D. PhD  [place], [place], [place] [address] [place] [address]  DIAGNOSIS: 56 year old female with ductal carcinoma in situ of the left breast ( stage 0)  PRIOR THERAPY:  #1 dose postlumpectomy after she was found to have clusters of microcalcifications within the upper outer quadrant of the left breast final pathology revealed a high grade ductal carcinoma in situ with 4 sentinel nodes that were negative for metastatic disease. Her surgery was performed on 12/02/2010.  #2 she is now status post adjuvant radiation therapy administered from 12/25/2010 2 02/17/2011 2  #3 she also developed mastitis of the left breast she received oral antibiotics as well as anti-inflammatories.  #4 she was then begun on tamoxifen 20 mg daily starting in August 2012.  CURRENT THERAPY: Current therapy 20 mg daily since August 2012.  INTERVAL HISTORY: AMAHIA MADONIA 56 y.o. female returns for followup visit today. Overall patient has done well over the last 6 months. She denies any fevers chills night sweats headaches shortness of breath chest pains palpitations. She does have significant amount of anxiety and she is on Celexa for this. There is a potential concern between interaction of Celexa and tamoxifen. This was very nicely brought up by her PCP. Clinically there have been no problems. Remainder of the review of systems is negative per.  MEDICAL HISTORY: Past Medical History  Diagnosis Date  . Diabetes mellitus   . Allergy   . Cellulitis     left  . Open breast wound     draining and abnormal odor   . Cancer     L breast  . Hyperlipidemia   . Vitamin d deficiency     ALLERGIES:  is allergic to compazine.  MEDICATIONS:  Current Outpatient Prescriptions  Medication Sig Dispense Refill  . cetirizine (ZYRTEC) 10 MG tablet Take 10 mg by mouth daily.        Marland Kitchen desonide (DESOWEN) 0.05 % cream Ad lib.      Marland Kitchen  doxycycline (DORYX) 100 MG DR capsule Take 100 mg by mouth 2 (two) times daily.       . fenofibrate 160 MG tablet daily.      Marland Kitchen FREESTYLE LITE test strip       . glipiZIDE (GLUCOTROL) 10 MG tablet Take 10 mg by mouth 2 (two) times daily before a meal.        . hydrOXYzine (ATARAX/VISTARIL) 10 MG tablet daily.      . Lancets (FREESTYLE) lancets       . metFORMIN (GLUCOPHAGE) 500 MG tablet Take 500 mg by mouth 2 (two) times daily with a meal.        . PROTOPIC 0.03 % ointment Ad lib.      . tamoxifen (NOLVADEX) 20 MG tablet daily.      . Vitamin D, Ergocalciferol, (DRISDOL) 50000 UNITS CAPS Once a week.      . fish oil-omega-3 fatty acids 1000 MG capsule Take 2 g by mouth daily.          SURGICAL HISTORY:  Past Surgical History  Procedure Date  . Cesarean section     4  . Bunionectomy   . Breast surgery     Left partial mastectomy    REVIEW OF SYSTEMS:  Pertinent items are noted in HPI.   PHYSICAL EXAMINATION: General appearance: alert and cooperative Neck: no adenopathy, no carotid bruit, no JVD, supple, symmetrical, trachea midline and thyroid  not enlarged, symmetric, no tenderness/mass/nodules Lymph nodes: Cervical, supraclavicular, and axillary nodes normal. Resp: clear to auscultation bilaterally and normal percussion bilaterally Back: symmetric, no curvature. ROM normal. No CVA tenderness. Cardio: regular rate and rhythm, S1, S2 normal, no murmur, click, rub or gallop and normal apical impulse GI: soft, non-tender; bowel sounds normal; no masses,  no organomegaly Extremities: extremities normal, atraumatic, no cyanosis or edema Neurologic: Alert and oriented X 3, normal strength and tone. Normal symmetric reflexes. Normal coordination and gait Sensory: normal Motor: grossly normal Reflexes: 2+ and symmetric Bilateral breasts are examined. The left breast reveals well-healed surgical scar no nodularity no nipple retraction. Right breast no masses or nipple discharge.  ECOG  PERFORMANCE STATUS: 0 - Asymptomatic  Blood pressure 126/76, pulse 76, temperature 98.1 F (36.7 C), temperature source Oral, height 5\' 3"  (1.6 m), weight 161 lb 9.6 oz (73.301 kg).  LABORATORY DATA: Lab Results  Component Value Date   WBC 4.8 01/04/2012   HGB 11.4* 01/04/2012   HCT 34.7* 01/04/2012   MCV 93.8 01/04/2012   PLT 188 01/04/2012      Chemistry      Component Value Date/Time   NA 140 06/29/2011 1007   NA 140 06/29/2011 1007   K 4.7 06/29/2011 1007   K 4.7 06/29/2011 1007   CL 104 06/29/2011 1007   CL 104 06/29/2011 1007   CO2 27 06/29/2011 1007   CO2 27 06/29/2011 1007   BUN 11 06/29/2011 1007   BUN 11 06/29/2011 1007   CREATININE 0.53 06/29/2011 1007   CREATININE 0.53 06/29/2011 1007      Component Value Date/Time   CALCIUM 9.5 06/29/2011 1007   CALCIUM 9.5 06/29/2011 1007   ALKPHOS 68 06/29/2011 1007   ALKPHOS 68 06/29/2011 1007   AST 17 06/29/2011 1007   AST 17 06/29/2011 1007   ALT 21 06/29/2011 1007   ALT 21 06/29/2011 1007   BILITOT 0.3 06/29/2011 1007   BILITOT 0.3 06/29/2011 1007        ASSESSMENT: 56 year old female with: #1 DCIS clinical stage 0 breast cancer. Originally diagnosed in April 2012 when she underwent her surgery. Overall she is doing well. She is on tamoxifen since August 2012 and has very minimal side effects.  #2 patient is on Celexa 20 mg daily there is a concern between drug drug interaction between Celexa and tamoxifen.   PLAN:   #1 patient will continue tamoxifen 20 mg daily she overall is tolerating it well.  #2 patient may be weaned off of Celexa and start taking Effexor XR. I have made the suggestion and she will discuss it with her primary care physician regarding this.  #3 patient will be seen back in 6 months time  All questions were answered. The patient knows to call the clinic with any problems, questions or concerns. We can certainly see the patient much sooner if necessary.  I spent 20 minutes counseling the  patient face to face. The total time spent in the appointment was 30 minutes.    Drue Second, MD Medical/Oncology [facility] [phone] (beeper) [phone] (Office)  01/04/2012, 3:10 PM

## 2012-01-04 NOTE — Patient Instructions (Signed)
!.   Continue Tamoxifen  2. Consider coming off of celexa and begin Effexor XR per your PCP  3. I will see you back in 6 months time

## 2012-02-01 ENCOUNTER — Telehealth: Payer: Self-pay | Admitting: *Deleted

## 2012-02-01 NOTE — Telephone Encounter (Signed)
Laurell Josephs, PA from [place] called [facility] with concern about pt's triglycerides elevated and could this be a side effect of the Tamoxifen? Request for MD to call to discuss. Call back # [phone]  Next Lab/ MD F/U 07/08/12  Message forwarded to MD for review.

## 2012-02-03 NOTE — Telephone Encounter (Deleted)
Call from [facility]

## 2012-02-03 NOTE — Telephone Encounter (Signed)
Call from [place], [place] - pt with elevated labs.  Reviewed with MD. Returned call to Laurell Josephs, PA.  Per Dr. Welton Flakes- Pt to stop Tamoxifen x 3 months and follow up as scheduled. Discussed with KP,I will notiiy pt of Dr. Milta Deiters instructions and to expect a call from [facility] office regarding a f/u lab appt.   Called pt discussed per MD- pt to stop Tamoxifen x 3months. Pt will f/u with KP to recheck labs.  Pt voiced concerns about stopping tamoxifen and recent knee pain-she has gone to see someone who told her it was arthritis.  Discussed with pt after her labs are rechecked Dr. Welton Flakes will re-evaluate if she should continue tamoxifen or not. To continue to monitor knee pain. Pt verbalized understanding. Denied any further concerns or questions.

## 2012-02-29 ENCOUNTER — Ambulatory Visit (INDEPENDENT_AMBULATORY_CARE_PROVIDER_SITE_OTHER): Payer: 59 | Admitting: General Surgery

## 2012-02-29 ENCOUNTER — Encounter (INDEPENDENT_AMBULATORY_CARE_PROVIDER_SITE_OTHER): Payer: Self-pay | Admitting: General Surgery

## 2012-02-29 VITALS — BP 128/86 | HR 88 | Temp 97.3°F | Resp 14 | Ht 64.0 in | Wt 160.8 lb

## 2012-02-29 DIAGNOSIS — Z853 Personal history of malignant neoplasm of breast: Secondary | ICD-10-CM

## 2012-02-29 NOTE — Progress Notes (Signed)
Operation: Left partial mastectomy and left axillary sentinel lymph node biopsy for DCIS  Date: December 02, 2010  Stage: DCIS  Hormone receptor status: Positive  HPI: She is here for long-term follow her for DCIS of the left breast. She denies any new masses in either breast.  No adenopathy.  No significant change in the left breast asymmetry. Last mammogram was a BIRADS 2.  She saw the Plastic Surgeon and decided not to proceed with nay plastic surgery.   PE: Gen. A.-well-developed well-nourished in no acute distress.  Right breast-soft with no dominant masses, suspicious skin changes, nipple discharge.   Left breast-well-healed upper outer quadrant scar with firmness and slightly less indentation.  Lymph nodes-no palpable cervical, supraclavicular, or axillary adenopathy.  Assessment:  DCIS right breast status post right partial mastectomy- no clinical evidence of recurrence.  Plan:   RTC 3 months.

## 2012-02-29 NOTE — Patient Instructions (Signed)
Call if you discover any new breast masses.

## 2012-04-21 ENCOUNTER — Telehealth: Payer: Self-pay

## 2012-04-21 DIAGNOSIS — R9389 Abnormal findings on diagnostic imaging of other specified body structures: Secondary | ICD-10-CM

## 2012-04-21 NOTE — Telephone Encounter (Signed)
Patient states Dr. Patsy Lager always schedules CT for lung since a spot was found. Would like a ref scheduled for this. Please call pt at [phone]

## 2012-04-22 ENCOUNTER — Telehealth: Payer: Self-pay

## 2012-04-22 NOTE — Telephone Encounter (Signed)
May I please have her paper chart- thanks!

## 2012-04-22 NOTE — Telephone Encounter (Signed)
Chart pulled in your box [phone]

## 2012-04-23 NOTE — Telephone Encounter (Signed)
Called and [facility]- I will arrange this CT.  If there is any question of pregnancy please let us know prior to CT scan

## 2012-04-27 ENCOUNTER — Ambulatory Visit
Admission: RE | Admit: 2012-04-27 | Discharge: 2012-04-27 | Disposition: A | Discharge status: Home / Self Care | Payer: 59 | Source: Ambulatory Visit | Attending: Family Medicine | Admitting: Family Medicine

## 2012-04-27 DIAGNOSIS — R9389 Abnormal findings on diagnostic imaging of other specified body structures: Secondary | ICD-10-CM

## 2012-04-29 ENCOUNTER — Telehealth: Payer: Self-pay | Admitting: Family Medicine

## 2012-04-29 ENCOUNTER — Encounter: Payer: Self-pay | Admitting: Family Medicine

## 2012-04-29 DIAGNOSIS — R6889 Other general symptoms and signs: Secondary | ICD-10-CM

## 2012-05-01 NOTE — Telephone Encounter (Signed)
Called and [facility]- will send letter

## 2012-06-07 ENCOUNTER — Encounter (INDEPENDENT_AMBULATORY_CARE_PROVIDER_SITE_OTHER): Payer: Self-pay | Admitting: General Surgery

## 2012-06-07 ENCOUNTER — Ambulatory Visit (INDEPENDENT_AMBULATORY_CARE_PROVIDER_SITE_OTHER): Payer: PRIVATE HEALTH INSURANCE | Admitting: General Surgery

## 2012-06-07 VITALS — BP 112/68 | HR 72 | Temp 97.8°F | Resp 16 | Ht 63.0 in | Wt 157.2 lb

## 2012-06-07 DIAGNOSIS — Z853 Personal history of malignant neoplasm of breast: Secondary | ICD-10-CM

## 2012-06-07 NOTE — Patient Instructions (Addendum)
Call if you find any new masses in your breasts. 

## 2012-06-07 NOTE — Progress Notes (Signed)
Operation: Left partial mastectomy and left axillary sentinel lymph node biopsy for DCIS  Date: December 02, 2010  Stage: DCIS  Hormone receptor status: Positive  HPI: She is here for long-term follow her for DCIS of the left breast. She denies any new masses in either breast.  No adenopathy.  No significant change in the left breast asymmetry.  She had to stop the Tamoxifen due to it causing hyperlipidemia.  She is due to see Dr. Welton Flakes next month and discuss another form of hormonal therapy.  PE: Gen. A.-well-developed well-nourished in no acute distress.  Right breast-soft with no dominant masses, suspicious skin changes, nipple discharge.   Left breast-well-healed upper outer quadrant scar with firmness and slightly less indentation.  Lymph nodes-no palpable cervical, supraclavicular, or axillary adenopathy.  Assessment:  DCIS right breast status post right partial mastectomy-  She had intolerance of Tamoxifen; no clinical evidence of recurrence.  Plan:   RTC 3 months.

## 2012-07-07 ENCOUNTER — Ambulatory Visit: Payer: 59 | Admitting: Oncology

## 2012-07-08 ENCOUNTER — Other Ambulatory Visit: Payer: 59 | Admitting: Lab

## 2012-07-08 ENCOUNTER — Ambulatory Visit (HOSPITAL_BASED_OUTPATIENT_CLINIC_OR_DEPARTMENT_OTHER): Payer: Managed Care, Other (non HMO) | Admitting: Adult Health

## 2012-07-08 ENCOUNTER — Encounter: Payer: Self-pay | Admitting: Adult Health

## 2012-07-08 ENCOUNTER — Other Ambulatory Visit (HOSPITAL_BASED_OUTPATIENT_CLINIC_OR_DEPARTMENT_OTHER): Payer: Managed Care, Other (non HMO) | Admitting: Lab

## 2012-07-08 VITALS — BP 136/80 | HR 86 | Temp 98.2°F | Resp 20 | Ht 63.0 in | Wt 158.6 lb

## 2012-07-08 DIAGNOSIS — D0590 Unspecified type of carcinoma in situ of unspecified breast: Secondary | ICD-10-CM

## 2012-07-08 DIAGNOSIS — D0510 Intraductal carcinoma in situ of unspecified breast: Secondary | ICD-10-CM

## 2012-07-08 DIAGNOSIS — E781 Pure hyperglyceridemia: Secondary | ICD-10-CM

## 2012-07-08 DIAGNOSIS — F411 Generalized anxiety disorder: Secondary | ICD-10-CM

## 2012-07-08 LAB — COMPREHENSIVE METABOLIC PANEL (CC13)
Albumin: 3.1 g/dL — ABNORMAL LOW (ref 3.5–5.0)
Alkaline Phosphatase: 73 U/L (ref 40–150)
Calcium: 9 mg/dL (ref 8.4–10.4)
Chloride: 108 mEq/L — ABNORMAL HIGH (ref 98–107)
Glucose: 131 mg/dl — ABNORMAL HIGH (ref 70–99)
Potassium: 4.1 mEq/L (ref 3.5–5.1)
Sodium: 141 mEq/L (ref 136–145)
Total Protein: 6.6 g/dL (ref 6.4–8.3)

## 2012-07-08 LAB — CBC WITH DIFFERENTIAL/PLATELET
Eosinophils Absolute: 0.1 10*3/uL (ref 0.0–0.5)
HGB: 11.4 g/dL — ABNORMAL LOW (ref 11.6–15.9)
MCV: 91.7 fL (ref 79.5–101.0)
MONO#: 0.5 10*3/uL (ref 0.1–0.9)
MONO%: 8.6 % (ref 0.0–14.0)
NEUT#: 3 10*3/uL (ref 1.5–6.5)
RBC: 3.7 10*6/uL (ref 3.70–5.45)
RDW: 13.8 % (ref 11.2–14.5)
WBC: 5.9 10*3/uL (ref 3.9–10.3)
lymph#: 2.3 10*3/uL (ref 0.9–3.3)

## 2012-07-08 MED ORDER — EXEMESTANE 25 MG PO TABS: 25.0000 mg | ORAL_TABLET | Freq: Every day | ORAL | Status: DC

## 2012-07-08 NOTE — Progress Notes (Signed)
OFFICE PROGRESS NOTE  CC Vick Frees or M.D.  Dorothy Puffer M.D. PhD  [place], [place] [address] [place] [address]  DIAGNOSIS: 56 year old female with ductal carcinoma in situ of the left breast ( stage 0)  PRIOR THERAPY:  #1 dose postlumpectomy after she was found to have clusters of microcalcifications within the upper outer quadrant of the left breast final pathology revealed a high grade ductal carcinoma in situ with 4 sentinel nodes that were negative for metastatic disease. Her surgery was performed on 12/02/2010.  #2 she is now status post adjuvant radiation therapy administered from 12/25/2010 2 02/17/2011 2  #3 she also developed mastitis of the left breast she received oral antibiotics as well as anti-inflammatories.  #4 she was then begun on tamoxifen 20 mg daily starting in August 2012.  CURRENT THERAPY: Current therapy 20 mg daily since August 2012.  INTERVAL HISTORY: Rebecca Robertson 56 y.o. female returns for followup visit today. Overall patient has done well over the last 6 months. She denies any fevers chills night sweats headaches shortness of breath chest pains palpitations. She does have significant amount of anxiety and she is on Celexa for this. There is a potential concern between interaction of Celexa and tamoxifen. This was very nicely brought up by her PCP. Clinically there have been no problems. Remainder of the review of systems is negative per.  MEDICAL HISTORY: Past Medical History  Diagnosis Date  . Diabetes mellitus   . Allergy   . Cellulitis     left  . Open breast wound     draining and abnormal odor   . Cancer     L breast  . Hyperlipidemia   . Vitamin D deficiency     ALLERGIES:  is allergic to compazine.  MEDICATIONS:  Current Outpatient Prescriptions  Medication Sig Dispense Refill  . cetirizine (ZYRTEC) 10 MG tablet Take 10 mg by mouth daily.        Marland Kitchen desonide (DESOWEN) 0.05 % cream Ad lib.      Marland Kitchen  doxycycline (DORYX) 100 MG DR capsule Take 100 mg by mouth 2 (two) times daily.       . fenofibrate 160 MG tablet daily.      . fish oil-omega-3 fatty acids 1000 MG capsule Take 2 g by mouth daily.        Marland Kitchen FREESTYLE LITE test strip       . glipiZIDE (GLUCOTROL) 10 MG tablet Take 10 mg by mouth 2 (two) times daily before a meal.        . hydrOXYzine (ATARAX/VISTARIL) 10 MG tablet daily.      . Lancets (FREESTYLE) lancets       . metFORMIN (GLUCOPHAGE) 500 MG tablet Take 500 mg by mouth 2 (two) times daily with a meal.        . PROTOPIC 0.03 % ointment Ad lib.      Marland Kitchen SOLODYN 80 MG TB24 daily.      . tamoxifen (NOLVADEX) 20 MG tablet daily.      Marland Kitchen venlafaxine XR (EFFEXOR-XR) 75 MG 24 hr capsule       . VESICARE 5 MG tablet daily.      . Vitamin D, Ergocalciferol, (DRISDOL) 50000 UNITS CAPS Once a week.        SURGICAL HISTORY:  Past Surgical History  Procedure Date  . Cesarean section     4  . Bunionectomy   . Breast surgery     Left partial mastectomy  REVIEW OF SYSTEMS:   General: fatigue (-), night sweats (-), fever (-), pain (-) Lymph: palpable nodes (-) HEENT: vision changes (-), mucositis (-), gum bleeding (-), epistaxis (-) Cardiovascular: chest pain (-), palpitations (-) Pulmonary: shortness of breath (-), dyspnea on exertion (-), cough (-), hemoptysis (-) GI:  Early satiety (-), melena (-), dysphagia (-), nausea/vomiting (-), diarrhea (-) GU: dysuria (-), hematuria (-), incontinence (-) Musculoskeletal: joint swelling (-), joint pain (-), back pain (-) Neuro: weakness (-), numbness (-), headache (-), confusion (-) Skin: Rash (-), lesions (-), dryness (-) Psych: depression (-), suicidal/homicidal ideation (-), feeling of hopelessness (-)  Health Maintenance  Mammogram: 10/2011 Colonoscopy: 2009 with 10 year f/u recommended Bone Density Scan: unknown Eye Exam: 1 yr ago Vtamin D Level: unknown Lipid Panel: 2013   PHYSICAL EXAMINATION:  BP 136/80  Pulse 86   Temp 98.2 F (36.8 C) (Oral)  Resp 20  Ht 5\' 3"  (1.6 m)  Wt 158 lb 9.6 oz (71.94 kg)  BMI 28.09 kg/m2 General: Patient is a well appearing female in no acute distress HEENT: PERRLA, sclerae anicteric no conjunctival pallor, MMM Neck: supple, no palpable adenopathy Lungs: clear to auscultation bilaterally, no wheezes, rhonchi, or rales Cardiovascular: regular rate rhythm, S1, S2, no murmurs, rubs or gallops Abdomen: Soft, non-tender, non-distended, normoactive bowel sounds, no HSM Extremities: warm and well perfused, no clubbing, cyanosis, or edema Skin: No rashes or lesions Neuro: Non-focal Bilateral breasts are examined. The left breast reveals well-healed surgical scar no nodularity no nipple retraction. Right breast no masses or nipple discharge. ECOG PERFORMANCE STATUS: 0 - Asymptomatic   LABORATORY DATA: Lab Results  Component Value Date   WBC 5.9 07/08/2012   HGB 11.4* 07/08/2012   HCT 33.9* 07/08/2012   MCV 91.7 07/08/2012   PLT 203 07/08/2012      Chemistry      Component Value Date/Time   NA 141 01/04/2012 1454   K 3.7 01/04/2012 1454   CL 106 01/04/2012 1454   CO2 26 01/04/2012 1454   BUN 15 01/04/2012 1454   CREATININE 0.62 01/04/2012 1454      Component Value Date/Time   CALCIUM 9.0 01/04/2012 1454   ALKPHOS 48 01/04/2012 1454   AST 18 01/04/2012 1454   ALT 19 01/04/2012 1454   BILITOT 0.1* 01/04/2012 1454        ASSESSMENT: 56 year old female with: #1 DCIS clinical stage 0 breast cancer. Originally diagnosed in April 2012 when she underwent her surgery. Overall she is doing well. She is on tamoxifen since August 2012 off tamoxifen in the past 6 months due to elevated triglycerides.  #2 Elevated triglycerides thought to be side effect of Tamoxifen.     PLAN:   #1 Talked to patient and discussed changing to an aromatase inhibitor Aromasin.  Order has been sent to her pharmacy.    #2  patient will be seen back in 6 months time  All questions were answered.  The patient knows to call the clinic with any problems, questions or concerns. We can certainly see the patient much sooner if necessary.  I spent 25 minutes counseling the patient face to face. The total time spent in the appointment was 30 minutes.  This case was reviewed with Dr. Welton Flakes.  Cherie Ouch Lyn Hollingshead, NP Medical Oncology [facility] Phone: [phone]    07/08/2012, 4:14 PM

## 2012-07-08 NOTE — Patient Instructions (Signed)
Doing well.  No sign of recurrence.  We will start you on Aromasin or Exemestane.  We will see you back in 6 months.  Please call us if you have any questions or concerns.     Exemestane tablets What is this medicine? EXEMESTANE (ex e MES tane) blocks the production of the hormone estrogen. Some types of breast cancer depend on estrogen to grow, and this medicine can stop tumor growth by blocking estrogen production. This medicine is for the treatment of breast cancer in postmenopausal women only. This medicine may be used for other purposes; ask your health care provider or pharmacist if you have questions. What should I tell my health care provider before I take this medicine? They need to know if you have any of these conditions: -an unusual or allergic reaction to exemestane, other medicines, foods, dyes, or preservatives -pregnant or trying to get pregnant -breast-feeding How should I use this medicine? Take this medicine by mouth with a glass of water. Follow the directions on the prescription label. Take your doses at regular intervals after a meal. Do not take your medicine more often than directed. Do not stop taking except on the advice of your doctor or health care professional. Contact your pediatrician regarding the use of this medicine in children. Special care may be needed. Overdosage: If you think you have taken too much of this medicine contact a poison control center or emergency room at once. NOTE: This medicine is only for you. Do not share this medicine with others. What if I miss a dose? If you miss a dose, take the next dose as usual. Do not try to make up the missed dose. Do not take double or extra doses. What may interact with this medicine? Do not take this medicine with any of the following medications: -female hormones, like estrogens and birth control pills This medicine may also interact with the following medications: -androstenedione -phenytoin -rifabutin,  rifampin, or rifapentine -St. John's Wort This list may not describe all possible interactions. Give your health care provider a list of all the medicines, herbs, non-prescription drugs, or dietary supplements you use. Also tell them if you smoke, drink alcohol, or use illegal drugs. Some items may interact with your medicine. What should I watch for while using this medicine? Visit your doctor or health care professional for regular checks on your progress. If you experience hot flashes or sweating while taking this medicine, avoid alcohol, smoking and drinks with caffeine. This may help to decrease these side effects. What side effects may I notice from receiving this medicine? Side effects that you should report to your doctor or health care professional as soon as possible: -any new or unusual symptoms -changes in vision -fever -leg or arm swelling -pain in bones, joints, or muscles -pain in hips, back, ribs, arms, shoulders, or legs Side effects that usually do not require medical attention (report to your doctor or health care professional if they continue or are bothersome): -difficulty sleeping -headache -hot flashes -sweating -unusually weak or tired This list may not describe all possible side effects. Call your doctor for medical advice about side effects. You may report side effects to FDA at 1-800-FDA-1088. Where should I keep my medicine? Keep out of the reach of children. Store at room temperature between 15 and 30 degrees C (59 and 86 degrees F). Throw away any unused medicine after the expiration date. NOTE: This sheet is a summary. It may not cover all possible information. If  you have questions about this medicine, talk to your doctor, pharmacist, or health care provider.  2012, Elsevier/Gold Standard. (12/06/2007 11:48:29 AM)

## 2012-08-17 HISTORY — PX: BREAST BIOPSY: SHX20

## 2012-09-12 ENCOUNTER — Other Ambulatory Visit (INDEPENDENT_AMBULATORY_CARE_PROVIDER_SITE_OTHER): Payer: Self-pay | Admitting: General Surgery

## 2012-09-12 ENCOUNTER — Encounter (INDEPENDENT_AMBULATORY_CARE_PROVIDER_SITE_OTHER): Payer: Self-pay | Admitting: General Surgery

## 2012-09-12 ENCOUNTER — Ambulatory Visit (INDEPENDENT_AMBULATORY_CARE_PROVIDER_SITE_OTHER): Payer: PRIVATE HEALTH INSURANCE | Admitting: General Surgery

## 2012-09-12 VITALS — BP 130/82 | HR 72 | Temp 97.8°F | Resp 18 | Ht 63.0 in | Wt 161.2 lb

## 2012-09-12 DIAGNOSIS — Z853 Personal history of malignant neoplasm of breast: Secondary | ICD-10-CM

## 2012-09-12 DIAGNOSIS — Z9889 Other specified postprocedural states: Secondary | ICD-10-CM

## 2012-09-12 NOTE — Progress Notes (Signed)
Operation: Left partial mastectomy and left axillary sentinel lymph node biopsy for DCIS  Date: December 02, 2010  Stage: DCIS  Hormone receptor status: Positive  HPI: She is here for long-term follow her for DCIS of the left breast. She denies any new masses in either breast.  No adenopathy.  No significant change in the left breast asymmetry.  She had to stop the Tamoxifen due to it causing hyperlipidemia.  She was started on Aromasin but has been having severe myalgias since starting it.  She was last seen in the cancer center before Thanksgiving of 2013 and was very unhappy with that visit.  PE: Gen. A.-well-developed well-nourished in no acute distress.  Right breast-soft with no dominant masses, suspicious skin changes, nipple discharge.   Left breast-well-healed upper outer quadrant scar with firmness and slightly less indentation.  Lymph nodes-no palpable cervical, supraclavicular, or axillary adenopathy.  Assessment:  DCIS right breast status post right partial mastectomy-  On an aromatase inhibitor but having severe myalgias. She's been trying to take Aleve but still is having and myalgias. She has not been called back by the cancer center to make her follow up appointment.  Plan:   RTC 3 months.  I told her I would send a message to the cancer center to see if they can help her out with her myalgias and scheduling her follow up appointment.

## 2012-09-12 NOTE — Patient Instructions (Signed)
Call if you find a new breast mass.

## 2012-09-20 ENCOUNTER — Other Ambulatory Visit: Payer: Self-pay | Admitting: Oncology

## 2012-10-04 ENCOUNTER — Telehealth: Payer: Self-pay | Admitting: Oncology

## 2012-10-04 NOTE — Telephone Encounter (Signed)
Pt was last seen in nov 2013 and not scheduled for f/u. Per Vanessa Barbara Scheduled pt for May f/u w/KK and appt not to be moved. Scheduled pt for 5/16 @ 2:15pm for lb and 2:30pm for KK - time per pt due to she really cannot leave work any earlier. S/w pt today she is aware of appt d/t. Comment added to appt note that appt w/KK and should not be moved.

## 2012-11-04 ENCOUNTER — Other Ambulatory Visit: Payer: Self-pay | Admitting: Oncology

## 2012-11-04 DIAGNOSIS — Z853 Personal history of malignant neoplasm of breast: Secondary | ICD-10-CM

## 2012-11-04 DIAGNOSIS — Z9889 Other specified postprocedural states: Secondary | ICD-10-CM

## 2012-11-07 ENCOUNTER — Other Ambulatory Visit: Payer: Self-pay | Admitting: Oncology

## 2012-11-07 ENCOUNTER — Ambulatory Visit
Admission: RE | Admit: 2012-11-07 | Discharge: 2012-11-07 | Disposition: A | Discharge status: Home / Self Care | Payer: Managed Care, Other (non HMO) | Source: Ambulatory Visit | Attending: General Surgery | Admitting: General Surgery

## 2012-11-07 DIAGNOSIS — Z9889 Other specified postprocedural states: Secondary | ICD-10-CM

## 2012-11-07 DIAGNOSIS — R921 Mammographic calcification found on diagnostic imaging of breast: Secondary | ICD-10-CM

## 2012-11-07 DIAGNOSIS — Z853 Personal history of malignant neoplasm of breast: Secondary | ICD-10-CM

## 2012-11-09 ENCOUNTER — Other Ambulatory Visit: Payer: Self-pay | Admitting: Urology

## 2012-11-14 ENCOUNTER — Ambulatory Visit
Admission: RE | Admit: 2012-11-14 | Discharge: 2012-11-14 | Disposition: A | Discharge status: Home / Self Care | Payer: Managed Care, Other (non HMO) | Source: Ambulatory Visit | Attending: Oncology | Admitting: Oncology

## 2012-11-14 ENCOUNTER — Other Ambulatory Visit: Payer: Self-pay | Admitting: Oncology

## 2012-11-14 DIAGNOSIS — R921 Mammographic calcification found on diagnostic imaging of breast: Secondary | ICD-10-CM

## 2012-12-13 ENCOUNTER — Encounter (INDEPENDENT_AMBULATORY_CARE_PROVIDER_SITE_OTHER): Payer: Self-pay | Admitting: General Surgery

## 2012-12-13 ENCOUNTER — Ambulatory Visit (INDEPENDENT_AMBULATORY_CARE_PROVIDER_SITE_OTHER): Payer: PRIVATE HEALTH INSURANCE | Admitting: General Surgery

## 2012-12-13 VITALS — BP 128/80 | HR 72 | Resp 18 | Ht 64.0 in | Wt 159.4 lb

## 2012-12-13 DIAGNOSIS — Z853 Personal history of malignant neoplasm of breast: Secondary | ICD-10-CM

## 2012-12-13 NOTE — Patient Instructions (Signed)
Call if you find any new lumps in your breasts. 

## 2012-12-13 NOTE — Progress Notes (Signed)
Operation: Left partial mastectomy and left axillary sentinel lymph node biopsy for DCIS  Date: December 02, 2010  Stage: DCIS  Hormone receptor status: Positive  HPI: She is here for long-term follow her for DCIS of the left breast.  Since I saw her last, she had a mammogram which demonstrated some suspicious calcifications in the right breast. Image guided biopsy was performed and this demonstrated a benign lesion. She denies any new masses in either breast.  No adenopathy.  No significant change in the left breast asymmetry.   PE: Gen. A.-well-developed well-nourished in no acute distress.  Right breast-soft with no dominant masses, suspicious skin changes, nipple discharge.   There is a small scar inferiorly that is firm.  Left breast-well-healed upper outer quadrant scar with firmness and slightly less indentation.  Lymph nodes-no palpable cervical, supraclavicular, or axillary adenopathy.  Assessment:  DCIS right breast status post right partial mastectomy-  Microcalcifications in the right breast were recently biopsied with results being benign. No evidence of recurrence in the left breast.  Plan:   Return visit 6 months.

## 2012-12-30 ENCOUNTER — Other Ambulatory Visit (HOSPITAL_BASED_OUTPATIENT_CLINIC_OR_DEPARTMENT_OTHER): Payer: Managed Care, Other (non HMO)

## 2012-12-30 ENCOUNTER — Telehealth: Payer: Self-pay | Admitting: Oncology

## 2012-12-30 ENCOUNTER — Ambulatory Visit (HOSPITAL_BASED_OUTPATIENT_CLINIC_OR_DEPARTMENT_OTHER): Payer: PRIVATE HEALTH INSURANCE | Admitting: Oncology

## 2012-12-30 ENCOUNTER — Encounter: Payer: Self-pay | Admitting: Oncology

## 2012-12-30 VITALS — BP 127/79 | HR 76 | Temp 98.1°F | Resp 20 | Ht 64.0 in | Wt 157.8 lb

## 2012-12-30 DIAGNOSIS — D0512 Intraductal carcinoma in situ of left breast: Secondary | ICD-10-CM

## 2012-12-30 DIAGNOSIS — D0510 Intraductal carcinoma in situ of unspecified breast: Secondary | ICD-10-CM

## 2012-12-30 DIAGNOSIS — D0590 Unspecified type of carcinoma in situ of unspecified breast: Secondary | ICD-10-CM

## 2012-12-30 LAB — CBC WITH DIFFERENTIAL/PLATELET
Basophils Absolute: 0.1 10*3/uL (ref 0.0–0.1)
Eosinophils Absolute: 0.1 10*3/uL (ref 0.0–0.5)
HGB: 12.4 g/dL (ref 11.6–15.9)
MCV: 89.8 fL (ref 79.5–101.0)
MONO#: 0.6 10*3/uL (ref 0.1–0.9)
MONO%: 8.5 % (ref 0.0–14.0)
NEUT#: 3.5 10*3/uL (ref 1.5–6.5)
RDW: 13.7 % (ref 11.2–14.5)
WBC: 6.7 10*3/uL (ref 3.9–10.3)
lymph#: 2.4 10*3/uL (ref 0.9–3.3)

## 2012-12-30 LAB — COMPREHENSIVE METABOLIC PANEL (CC13)
Albumin: 3.4 g/dL — ABNORMAL LOW (ref 3.5–5.0)
BUN: 20.9 mg/dL (ref 7.0–26.0)
CO2: 26 mEq/L (ref 22–29)
Calcium: 9.3 mg/dL (ref 8.4–10.4)
Chloride: 105 mEq/L (ref 98–107)
Glucose: 122 mg/dl — ABNORMAL HIGH (ref 70–99)
Potassium: 4 mEq/L (ref 3.5–5.1)
Total Protein: 6.8 g/dL (ref 6.4–8.3)

## 2012-12-30 MED ORDER — ANASTROZOLE 1 MG PO TABS: 1.0000 mg | ORAL_TABLET | Freq: Every day | ORAL | Status: AC

## 2012-12-30 NOTE — Progress Notes (Signed)
OFFICE PROGRESS NOTE  CC Vick Frees or M.D.  Dorothy Puffer M.D. PhD  [place], PA-C [address] [place] [address]  DIAGNOSIS: 57 year old female with ductal carcinoma in situ of the left breast ( stage 0)  PRIOR THERAPY:  #1 dose postlumpectomy after she was found to have clusters of microcalcifications within the upper outer quadrant of the left breast final pathology revealed a high grade ductal carcinoma in situ with 4 sentinel nodes that were negative for metastatic disease. Her surgery was performed on 12/02/2010.  #2 she is now status post adjuvant radiation therapy administered from 12/25/2010 2 02/17/2011 2  #3 she also developed mastitis of the left breast she received oral antibiotics as well as anti-inflammatories.  #4 she was then begun on tamoxifen 20 mg daily starting in August 2012 but this was discontinued due to side effects. She was then begun on Aromasin 25 mg daily but she is not able to tolerate that as well due to aches and pains..  #4 begin arimidex1 milligram daily risks and benefits and side effects were discussed with the patient literature was given to her.  CURRENT THERAPY: Arimidex 1 mg daily starting 12/30/2012  INTERVAL HISTORY: MIONNA ADVINCULA 57 y.o. female returns for followup visit today.overall she seems to be doing well but she is complaining of fatigue aches and pains and ongoing weight gain. She does not think that she is able to tolerate the  Aromasin as well. It certainly is better than the tamoxifen but it still is making her very tired weak fatigued and has cramping in her legs. Therefore I have recommended she discontinue this. She has no vaginal bleeding no double vision, Or headaches no fevers chills or night sweats. Remainder of the template review of systems is negative.  MEDICAL HISTORY: Past Medical History  Diagnosis Date  . Diabetes mellitus   . Allergy   . Cellulitis     left  . Open breast wound    draining and abnormal odor   . Cancer     L breast  . Hyperlipidemia   . Vitamin D deficiency     ALLERGIES:  is allergic to compazine.  MEDICATIONS:  Current Outpatient Prescriptions  Medication Sig Dispense Refill  . BD PEN NEEDLE NANO U/F 32G X 4 MM MISC       . citalopram (CELEXA) 20 MG tablet       . fenofibrate 160 MG tablet daily.      . fish oil-omega-3 fatty acids 1000 MG capsule Take 2 g by mouth 2 (two) times daily.       Marland Kitchen FREESTYLE LITE test strip       . glipiZIDE (GLUCOTROL) 10 MG tablet Take 10 mg by mouth daily.       . Lancets (FREESTYLE) lancets       . metFORMIN (GLUCOPHAGE) 500 MG tablet Take 500 mg by mouth 2 (two) times daily with a meal.       . minocycline (MINOCIN,DYNACIN) 100 MG capsule Take 100 mg by mouth 2 (two) times daily.      Marland Kitchen VICTOZA 18 MG/3ML SOPN daily.       Marland Kitchen anastrozole (ARIMIDEX) 1 MG tablet Take 1 tablet (1 mg total) by mouth daily.  90 tablet  12  . Azelaic Acid (FINACEA) 15 % cream Apply topically as needed. After skin is thoroughly washed and patted dry, gently but thoroughly massage a thin film of azelaic acid cream into the affected area twice daily,  in the morning and evening.       No current facility-administered medications for this visit.    SURGICAL HISTORY:  Past Surgical History  Procedure Laterality Date  . Cesarean section      4  . Bunionectomy    . Breast surgery      Left partial mastectomy    REVIEW OF SYSTEMS:   General: fatigue (-), night sweats (-), fever (-), pain (-) Lymph: palpable nodes (-) HEENT: vision changes (-), mucositis (-), gum bleeding (-), epistaxis (-) Cardiovascular: chest pain (-), palpitations (-) Pulmonary: shortness of breath (-), dyspnea on exertion (-), cough (-), hemoptysis (-) GI:  Early satiety (-), melena (-), dysphagia (-), nausea/vomiting (-), diarrhea (-) GU: dysuria (-), hematuria (-), incontinence (-) Musculoskeletal: joint swelling (-), joint pain (-), back pain (-) Neuro:  weakness (-), numbness (-), headache (-), confusion (-) Skin: Rash (-), lesions (-), dryness (-) Psych: depression (-), suicidal/homicidal ideation (-), feeling of hopelessness (-)  Health Maintenance  Mammogram: 10/2011 Colonoscopy: 2009 with 10 year f/u recommended Bone Density Scan: unknown Eye Exam: 1 yr ago Vtamin D Level: unknown Lipid Panel: 2013   PHYSICAL EXAMINATION:  BP 127/79  Pulse 76  Temp(Src) 98.1 F (36.7 C) (Oral)  Resp 20  Ht 5\' 4"  (1.626 m)  Wt 157 lb 12.8 oz (71.578 kg)  BMI 27.07 kg/m2 General: Patient is a well appearing female in no acute distress HEENT: PERRLA, sclerae anicteric no conjunctival pallor, MMM Neck: supple, no palpable adenopathy Lungs: clear to auscultation bilaterally, no wheezes, rhonchi, or rales Cardiovascular: regular rate rhythm, S1, S2, no murmurs, rubs or gallops Abdomen: Soft, non-tender, non-distended, normoactive bowel sounds, no HSM Extremities: warm and well perfused, no clubbing, cyanosis, or edema Skin: No rashes or lesions Neuro: Non-focal Bilateral breasts are examined. The left breast reveals well-healed surgical scar no nodularity no nipple retraction. Right breast no masses or nipple discharge. ECOG PERFORMANCE STATUS: 0 - Asymptomatic   LABORATORY DATA: Lab Results  Component Value Date   WBC 6.7 12/30/2012   HGB 12.4 12/30/2012   HCT 36.2 12/30/2012   MCV 89.8 12/30/2012   PLT 226 12/30/2012      Chemistry      Component Value Date/Time   NA 141 12/30/2012 1426   NA 141 01/04/2012 1454   K 4.0 12/30/2012 1426   K 3.7 01/04/2012 1454   CL 105 12/30/2012 1426   CL 106 01/04/2012 1454   CO2 26 12/30/2012 1426   CO2 26 01/04/2012 1454   BUN 20.9 12/30/2012 1426   BUN 15 01/04/2012 1454   CREATININE 0.7 12/30/2012 1426   CREATININE 0.62 01/04/2012 1454      Component Value Date/Time   CALCIUM 9.3 12/30/2012 1426   CALCIUM 9.0 01/04/2012 1454   ALKPHOS 77 12/30/2012 1426   ALKPHOS 48 01/04/2012 1454   AST 20  12/30/2012 1426   AST 18 01/04/2012 1454   ALT 29 12/30/2012 1426   ALT 19 01/04/2012 1454   BILITOT 0.21 12/30/2012 1426   BILITOT 0.1* 01/04/2012 1454        ASSESSMENT: 57 year old female with: #1 DCIS clinical stage 0 breast cancer. Originally diagnosed in April 2012 when she underwent her surgery. Overall she is doing well. She is on tamoxifen since August 2012 off tamoxifen in the past 6 months due to elevated triglycerides.  #2 Elevated triglycerides thought to be side effect of Tamoxifen.    #3 she was begun on aromasin 25 mg daily but was  stopped this now. And she will begin arimidex 1 mg daily    PLAN:  #1 patient will discontinue Aromasin  #2 she will begin Arimidex 1 mg daily.  #3  patient will be seen back in 3 months time  All questions were answered. The patient knows to call the clinic with any problems, questions or concerns. We can certainly see the patient much sooner if necessary.  I spent 25 minutes counseling the patient face to face. The total time spent in the appointment was 30 minutes.  Drue Second, MD Medical/Oncology [facility] [phone] (beeper) [phone] (Office)  12/30/2012, 3:46 PM

## 2012-12-30 NOTE — Patient Instructions (Addendum)
Discontinue the [employer] arimidex 1 mg daily  I will see you back on 7/16  Anastrozole tablets What is this medicine? ANASTROZOLE (an AS troe zole) is used to treat breast cancer in women who have gone through menopause. Some types of breast cancer depend on estrogen to grow, and this medicine can stop tumor growth by blocking estrogen production. This medicine may be used for other purposes; ask your health care provider or pharmacist if you have questions. What should I tell my health care provider before I take this medicine? They need to know if you have any of these conditions: -liver disease -an unusual or allergic reaction to anastrozole, other medicines, foods, dyes, or preservatives -pregnant or trying to get pregnant -breast-feeding How should I use this medicine? Take this medicine by mouth with a glass of water. Follow the directions on the prescription label. You can take this medicine with or without food. Take your doses at regular intervals. Do not take your medicine more often than directed. Do not stop taking except on the advice of your doctor or health care professional. Talk to your pediatrician regarding the use of this medicine in children. Special care may be needed. Overdosage: If you think you have taken too much of this medicine contact a poison control center or emergency room at once. NOTE: This medicine is only for you. Do not share this medicine with others. What if I miss a dose? If you miss a dose, take it as soon as you can. If it is almost time for your next dose, take only that dose. Do not take double or extra doses. What may interact with this medicine? Do not take this medicine with any of the following medications: -female hormones, like estrogens or progestins and birth control pills This medicine may also interact with the following medications: -tamoxifen This list may not describe all possible interactions. Give your health care provider  a list of all the medicines, herbs, non-prescription drugs, or dietary supplements you use. Also tell them if you smoke, drink alcohol, or use illegal drugs. Some items may interact with your medicine. What should I watch for while using this medicine? Visit your doctor or health care professional for regular checks on your progress. Let your doctor or health care professional know about any unusual vaginal bleeding. Do not treat yourself for diarrhea, nausea, vomiting or other side effects. Ask your doctor or health care professional for advice. What side effects may I notice from receiving this medicine? Side effects that you should report to your doctor or health care professional as soon as possible: -allergic reactions like skin rash, itching or hives, swelling of the face, lips, or tongue -any new or unusual symptoms -breathing problems -chest pain -leg pain or swelling -vomiting Side effects that usually do not require medical attention (report to your doctor or health care professional if they continue or are bothersome): -back or bone pain -cough, or throat infection -diarrhea or constipation -dizziness -headache -hot flashes -loss of appetite -nausea -sweating -weakness and tiredness -weight gain This list may not describe all possible side effects. Call your doctor for medical advice about side effects. You may report side effects to FDA at 1-800-FDA-1088. Where should I keep my medicine? Keep out of the reach of children. Store at room temperature between 20 and 25 degrees C (68 and 77 degrees F). Throw away any unused medicine after the expiration date. NOTE: This sheet is a summary. It may not cover all  possible information. If you have questions about this medicine, talk to your doctor, pharmacist, or health care provider.  2013, [employer]vier/[employer] Standard. (10/14/2007 4:31:52 PM)

## 2013-01-17 ENCOUNTER — Telehealth: Payer: Self-pay | Admitting: Oncology

## 2013-01-17 NOTE — Telephone Encounter (Signed)
, °

## 2013-01-18 ENCOUNTER — Telehealth: Payer: Self-pay | Admitting: Oncology

## 2013-01-27 ENCOUNTER — Encounter (HOSPITAL_BASED_OUTPATIENT_CLINIC_OR_DEPARTMENT_OTHER): Payer: Self-pay | Admitting: *Deleted

## 2013-01-27 NOTE — Progress Notes (Signed)
NPO AFTER MN. ARRIVES AT 0600. NEEDS T & S AND EKG ON ARRIVAL . PT COMING TO GET OTHER LABS ORDERS PRIOR TO DOS.

## 2013-01-31 LAB — CBC
Platelets: 264 10*3/uL (ref 150–400)
RBC: 4.58 MIL/uL (ref 3.87–5.11)
RDW: 13.1 % (ref 11.5–15.5)
WBC: 7.3 10*3/uL (ref 4.0–10.5)

## 2013-01-31 LAB — BASIC METABOLIC PANEL
Calcium: 10.3 mg/dL (ref 8.4–10.5)
GFR calc Af Amer: >90 mL/min (ref >90)
GFR calc non Af Amer: >90 mL/min (ref >90)
Potassium: 4.8 mEq/L (ref 3.5–5.1)
Sodium: 139 mEq/L (ref 135–145)

## 2013-01-31 LAB — PROTIME-INR: Prothrombin Time: 12.3 seconds (ref 11.6–15.2)

## 2013-01-31 LAB — APTT: aPTT: 25 seconds (ref 24–37)

## 2013-02-01 LAB — HEPATIC FUNCTION PANEL
ALT: 25 (ref 7–35)
AST: 18 (ref 13–35)
Alkaline Phosphatase: 81 (ref 25–125)
BILIRUBIN, TOTAL: 0.5

## 2013-02-01 LAB — HEMOGLOBIN A1C: HEMOGLOBIN A1C: 7

## 2013-02-01 LAB — BASIC METABOLIC PANEL
BUN: 17 (ref 4–21)
Creatinine: 0.6 (ref 0.5–1.1)
GLUCOSE: 139

## 2013-02-01 LAB — LIPID PANEL
Cholesterol: 214 — AB (ref 0–200)
HDL: 183 — AB (ref 35–70)
LDL CALC: 121
TRIGLYCERIDES: 304 — AB (ref 40–160)

## 2013-02-01 LAB — VITAMIN D 25 HYDROXY (VIT D DEFICIENCY, FRACTURES): VIT D 25 HYDROXY: 18.7

## 2013-02-02 NOTE — H&P (Signed)
History of Present Illness   Rebecca Robertson has ongoing leakage without awareness and mild to moderate stress incontinence and has failed VESIcare, Toviaz, Myrbetriq, and Enablex.  She has mild stable frequency.  I reviewed her urodynamics.  Review of Systems: No change in bowel or neurologic systems.   She has always had some cost restraints.   Urinalysis: I reviewed, negative.    Past Medical History Problems  1. History of  Breast Cancer V10.3 2. History of  Diabetes Mellitus 250.00  Surgical History Problems  1. History of  Breast Surgery Lumpectomy 2. History of  Cesarean Section  Current Meds 1. Citalopram Hydrobromide 20 MG Oral Tablet; Therapy: (Recorded:21Mar2014) to 2. Fenofibrate 160 MG Oral Tablet; Therapy: (Recorded:25Nov2013) to 3. Fish Oil CAPS; Therapy: (Recorded:25Nov2013) to 4. GlipiZIDE 10 MG Oral Tablet; Therapy: (Recorded:25Nov2013) to 5. MetFORMIN HCl 500 MG Oral Tablet; Therapy: (Recorded:21Mar2014) to  Allergies Medication  1. Compazine TABS  Family History Problems  1. Paternal history of  Cardiac Arrest 2. Paternal history of  Death In The Family Father father passed @ age 67cardiac arrest 3. Maternal history of  Death In The Family Mother mother passed @ age 83lung cancer 4. Family history of  Family Health Status Number Of Children 1 son3 daughters 5. Maternal history of  Lung Cancer V16.1 6. Maternal aunt's history of  Melanoma In Situ Of The Skin 7. Maternal aunt's history of  Uterine Cancer V16.49  Social History Problems  1. Marital History - Currently Married 2. Never A Smoker 3. Occupation: part time Denied  4. History of  Alcohol Use 5. History of  Caffeine Use 6. History of  Tobacco Use  Vitals Vital Signs [Data Includes: Last 1 Day]  21Mar2014 03:45PM  Blood Pressure: 129 / 85 Heart Rate: 84  Results/Data  04 Nov 2012 3:31 PM   UA With REFLEX       COLOR YELLOW       APPEARANCE CLEAR       SPECIFIC GRAVITY  1.030       pH 5.5       GLUCOSE NEG       BILIRUBIN NEG       KETONE NEG       BLOOD NEG       PROTEIN NEG       UROBILINOGEN 0.2       NITRITE NEG       LEUKOCYTE ESTERASE NEG      Assessment Assessed  1. Female Stress Incontinence 625.6 2. Urinary Incontinence Without Sensory Awareness 788.34  Plan   Discussion/Summary   I drew Rebecca Robertson a picture. I went over watchful waiting versus behavioral therapy as discussed previously, and a sling.  We talked about a sling in detail. Pros, cons, general surgical and anesthetic risks, and other options including behavioral therapy and watchful waiting were discussed. She understands that slings are generally successful in 90% of cases for stress incontinence, 50% for urge incontinence, and that in a small percentage of cases the incontinence can worsen. The risk of persistent, de novo, or worsening incontinence/dysfunction was discussed. Risks were described but not limited to the discussion of injury to neighboring structures including the bowel (with possible life-threatening sepsis and colostomy), bladder, urethra, vagina (all resulting in further surgery), and ureter (resulting in re-implantation). We also talked about the risk of retention requiring urethrolysis, extrusion requiring revision, and erosion resulting in further surgery. Bleeding risks and transfusion rates and the risk of infection were discussed. The  risk of pelvic and abdominal pain syndromes, dyspareunia, and neuropathies were discussed. The need for CIC was described as well as the usual postoperative course. The patient understands that she might not reach her treatment goal and that she might be worse following surgery. Mesh TV issues were discussed.  Mesh issues on TV were discussed. 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 We will proceed accordingly. I think is overall a good choice for her. She is cleared if her leakage  without awareness is due to overactivity since this being helped is only approximately 50%.   After a thorough review of the management options for the patient's condition the patient  elected to proceed with surgical therapy as noted above. We have discussed the potential benefits and risks of the procedure, side effects of the proposed treatment, the likelihood of the patient achieving the goals of the procedure, and any potential problems that might occur during the procedure or recuperation. Informed consent has been obtained.

## 2013-02-03 ENCOUNTER — Encounter (HOSPITAL_BASED_OUTPATIENT_CLINIC_OR_DEPARTMENT_OTHER): Payer: Self-pay | Admitting: Anesthesiology

## 2013-02-03 ENCOUNTER — Other Ambulatory Visit: Payer: Self-pay

## 2013-02-03 ENCOUNTER — Ambulatory Visit (HOSPITAL_BASED_OUTPATIENT_CLINIC_OR_DEPARTMENT_OTHER): Payer: PRIVATE HEALTH INSURANCE | Admitting: Anesthesiology

## 2013-02-03 ENCOUNTER — Ambulatory Visit (HOSPITAL_BASED_OUTPATIENT_CLINIC_OR_DEPARTMENT_OTHER)
Admission: RE | Admit: 2013-02-03 | Discharge: 2013-02-03 | Disposition: A | Discharge status: Home / Self Care | Payer: PRIVATE HEALTH INSURANCE | Source: Ambulatory Visit | Attending: Urology | Admitting: Urology

## 2013-02-03 ENCOUNTER — Encounter (HOSPITAL_BASED_OUTPATIENT_CLINIC_OR_DEPARTMENT_OTHER)
Admission: RE | Disposition: A | Discharge status: Home / Self Care | Payer: Self-pay | Source: Ambulatory Visit | Attending: Urology

## 2013-02-03 DIAGNOSIS — E119 Type 2 diabetes mellitus without complications: Secondary | ICD-10-CM | POA: Insufficient documentation

## 2013-02-03 DIAGNOSIS — N35919 Unspecified urethral stricture, male, unspecified site: Secondary | ICD-10-CM | POA: Insufficient documentation

## 2013-02-03 DIAGNOSIS — Z79899 Other long term (current) drug therapy: Secondary | ICD-10-CM | POA: Insufficient documentation

## 2013-02-03 DIAGNOSIS — Z853 Personal history of malignant neoplasm of breast: Secondary | ICD-10-CM | POA: Insufficient documentation

## 2013-02-03 DIAGNOSIS — N393 Stress incontinence (female) (male): Secondary | ICD-10-CM | POA: Insufficient documentation

## 2013-02-03 HISTORY — DX: Personal history of malignant neoplasm of breast: Z85.3

## 2013-02-03 HISTORY — DX: Other specified postprocedural states: Z98.890

## 2013-02-03 HISTORY — DX: Nausea with vomiting, unspecified: R11.2

## 2013-02-03 HISTORY — DX: Stress incontinence (female) (male): N39.3

## 2013-02-03 HISTORY — PX: BLADDER SUSPENSION: SHX72

## 2013-02-03 LAB — GLUCOSE, CAPILLARY: Glucose-Capillary: 135 mg/dL — ABNORMAL HIGH (ref 70–99)

## 2013-02-03 LAB — TYPE AND SCREEN

## 2013-02-03 SURGERY — CREATION, URETHRAL SLING, RETROPUBIC APPROACH, USING POLYPROPYLENE TAPE
Anesthesia: General | Site: Vagina | Wound class: Clean

## 2013-02-03 MED ORDER — DEXAMETHASONE SODIUM PHOSPHATE 4 MG/ML IJ SOLN
INTRAMUSCULAR | Status: DC | PRN
  Administered 2013-02-03: 10 mg via INTRAVENOUS

## 2013-02-03 MED ORDER — ROCURONIUM BROMIDE 100 MG/10ML IV SOLN
INTRAVENOUS | Status: DC | PRN
  Administered 2013-02-03: 30 mg via INTRAVENOUS

## 2013-02-03 MED ORDER — PROMETHAZINE HCL 25 MG/ML IJ SOLN
6.2500 mg | INTRAMUSCULAR | Status: DC | PRN
  Filled 2013-02-03: qty 1

## 2013-02-03 MED ORDER — FENTANYL CITRATE 0.05 MG/ML IJ SOLN
INTRAMUSCULAR | Status: DC | PRN
  Administered 2013-02-03 (×2): 100 ug via INTRAVENOUS

## 2013-02-03 MED ORDER — MEPERIDINE HCL 25 MG/ML IJ SOLN
6.2500 mg | INTRAMUSCULAR | Status: DC | PRN
  Filled 2013-02-03: qty 1

## 2013-02-03 MED ORDER — HYDROCODONE-ACETAMINOPHEN 5-325 MG PO TABS: 1.0000 | ORAL_TABLET | Freq: Four times a day (QID) | ORAL | Status: DC | PRN

## 2013-02-03 MED ORDER — 0.9 % SODIUM CHLORIDE (POUR BTL) OPTIME
TOPICAL | Status: DC | PRN
  Administered 2013-02-03: 1000 mL

## 2013-02-03 MED ORDER — FENTANYL CITRATE 0.05 MG/ML IJ SOLN
25.0000 ug | INTRAMUSCULAR | Status: DC | PRN
  Filled 2013-02-03: qty 1

## 2013-02-03 MED ORDER — NEOSTIGMINE METHYLSULFATE 1 MG/ML IJ SOLN
INTRAMUSCULAR | Status: DC | PRN
  Administered 2013-02-03: 3 mg via INTRAVENOUS

## 2013-02-03 MED ORDER — LIDOCAINE-EPINEPHRINE (PF) 1 %-1:200000 IJ SOLN
INTRAMUSCULAR | Status: DC | PRN
  Administered 2013-02-03: 2 mL

## 2013-02-03 MED ORDER — ESTRADIOL 0.1 MG/GM VA CREA
TOPICAL_CREAM | VAGINAL | Status: DC | PRN
  Administered 2013-02-03: 1 via VAGINAL

## 2013-02-03 MED ORDER — LACTATED RINGERS IV SOLN
INTRAVENOUS | Status: DC
  Filled 2013-02-03: qty 1000

## 2013-02-03 MED ORDER — SODIUM CHLORIDE 0.9 % IV SOLN
1.5000 g | INTRAVENOUS | Status: AC
  Administered 2013-02-03: 1.5 g via INTRAVENOUS
  Filled 2013-02-03: qty 1.5

## 2013-02-03 MED ORDER — GENTAMICIN SULFATE 40 MG/ML IJ SOLN
300.0000 mg | Freq: Once | INTRAVENOUS | Status: AC
  Administered 2013-02-03: 300 mg via INTRAVENOUS
  Filled 2013-02-03: qty 7.5

## 2013-02-03 MED ORDER — ONDANSETRON HCL 4 MG/2ML IJ SOLN
INTRAMUSCULAR | Status: DC | PRN
  Administered 2013-02-03: 4 mg via INTRAVENOUS

## 2013-02-03 MED ORDER — LIDOCAINE HCL (CARDIAC) 20 MG/ML IV SOLN
INTRAVENOUS | Status: DC | PRN
  Administered 2013-02-03: 60 mg via INTRAVENOUS

## 2013-02-03 MED ORDER — LACTATED RINGERS IV SOLN
INTRAVENOUS | Status: DC
  Administered 2013-02-03: 100 mL/h via INTRAVENOUS
  Administered 2013-02-03: 08:00:00 via INTRAVENOUS
  Administered 2013-02-03: 100 mL/h via INTRAVENOUS
  Filled 2013-02-03: qty 1000

## 2013-02-03 MED ORDER — HYDROCODONE-ACETAMINOPHEN 5-325 MG PO TABS
1.0000 | ORAL_TABLET | Freq: Once | ORAL | Status: AC
  Administered 2013-02-03: 1 via ORAL
  Filled 2013-02-03: qty 1

## 2013-02-03 MED ORDER — GENTAMICIN IN SALINE 1.6-0.9 MG/ML-% IV SOLN
80.0000 mg | INTRAVENOUS | Status: DC
  Filled 2013-02-03: qty 50

## 2013-02-03 MED ORDER — CIPROFLOXACIN HCL 250 MG PO TABS: 250.0000 mg | ORAL_TABLET | Freq: Two times a day (BID) | ORAL | Status: DC

## 2013-02-03 MED ORDER — MIDAZOLAM HCL 5 MG/5ML IJ SOLN
INTRAMUSCULAR | Status: DC | PRN
  Administered 2013-02-03: 2 mg via INTRAVENOUS

## 2013-02-03 MED ORDER — INDIGOTINDISULFONATE SODIUM 8 MG/ML IJ SOLN
INTRAMUSCULAR | Status: DC | PRN
  Administered 2013-02-03: 5 mL via INTRAVENOUS

## 2013-02-03 MED ORDER — STERILE WATER FOR IRRIGATION IR SOLN
Status: DC | PRN
  Administered 2013-02-03: 3000 mL via INTRAVESICAL

## 2013-02-03 MED ORDER — PROPOFOL 10 MG/ML IV BOLUS
INTRAVENOUS | Status: DC | PRN
  Administered 2013-02-03: 200 mg via INTRAVENOUS

## 2013-02-03 MED ORDER — GLYCOPYRROLATE 0.2 MG/ML IJ SOLN
INTRAMUSCULAR | Status: DC | PRN
  Administered 2013-02-03: 0.4 mg via INTRAVENOUS

## 2013-02-03 SURGICAL SUPPLY — 57 items
ADH SKN CLS APL DERMABOND .7 (GAUZE/BANDAGES/DRESSINGS) ×1
BAG URINE DRAINAGE (UROLOGICAL SUPPLIES) IMPLANT
BLADE HEX COATED 2.75 (ELECTRODE) ×2 IMPLANT
BLADE SURG 10 STRL SS (BLADE) ×2 IMPLANT
BLADE SURG 15 STRL LF DISP TIS (BLADE) ×1 IMPLANT
BLADE SURG 15 STRL SS (BLADE) ×2
BLADE SURG ROTATE 9660 (MISCELLANEOUS) ×2 IMPLANT
CANISTER SUCT LVC 12 LTR MEDI- (MISCELLANEOUS) IMPLANT
CANISTER SUCTION 1200CC (MISCELLANEOUS) ×2 IMPLANT
CATH FOLEY 2WAY SLVR  5CC 14FR (CATHETERS) ×1
CATH FOLEY 2WAY SLVR  5CC 16FR (CATHETERS)
CATH FOLEY 2WAY SLVR 5CC 14FR (CATHETERS) ×1 IMPLANT
CATH FOLEY 2WAY SLVR 5CC 16FR (CATHETERS) IMPLANT
CLOTH BEACON ORANGE TIMEOUT ST (SAFETY) ×2 IMPLANT
COVER MAYO STAND STRL (DRAPES) ×4 IMPLANT
COVER TABLE BACK 60X90 (DRAPES) ×2 IMPLANT
DERMABOND ADVANCED (GAUZE/BANDAGES/DRESSINGS) ×1
DERMABOND ADVANCED .7 DNX12 (GAUZE/BANDAGES/DRESSINGS) ×1 IMPLANT
DRAIN PENROSE 18X1/4 LTX STRL (WOUND CARE) IMPLANT
DRAPE CAMERA CLOSED 9X96 (DRAPES) ×2 IMPLANT
DRAPE UNDERBUTTOCKS STRL (DRAPE) ×2 IMPLANT
DRESSING TELFA 8X3 (GAUZE/BANDAGES/DRESSINGS) IMPLANT
DRSG PAD ABDOMINAL 8X10 ST (GAUZE/BANDAGES/DRESSINGS) ×2 IMPLANT
ELECT REM PT RETURN 9FT ADLT (ELECTROSURGICAL) ×2
ELECTRODE REM PT RTRN 9FT ADLT (ELECTROSURGICAL) ×1 IMPLANT
GAUZE SPONGE 4X4 12PLY STRL LF (GAUZE/BANDAGES/DRESSINGS) IMPLANT
GAUZE SPONGE 4X4 16PLY XRAY LF (GAUZE/BANDAGES/DRESSINGS) IMPLANT
GLOVE BIO SURGEON STRL SZ7.5 (GLOVE) ×4 IMPLANT
GOWN STRL NON-REIN LRG LVL3 (GOWN DISPOSABLE) ×2 IMPLANT
GOWN STRL REIN XL XLG (GOWN DISPOSABLE) ×2 IMPLANT
HOLDER FOLEY CATH W/STRAP (MISCELLANEOUS) ×2 IMPLANT
HOOK RETRACTION 12 ELAST STAY (MISCELLANEOUS) IMPLANT
NEEDLE HYPO 22GX1.5 SAFETY (NEEDLE) ×2 IMPLANT
NS IRRIG 500ML POUR BTL (IV SOLUTION) ×2 IMPLANT
PACK BASIN DAY SURGERY FS (CUSTOM PROCEDURE TRAY) ×2 IMPLANT
PACKING VAGINAL (PACKING) ×2 IMPLANT
PENCIL BUTTON HOLSTER BLD 10FT (ELECTRODE) ×2 IMPLANT
PLUG CATH AND CAP STER (CATHETERS) ×2 IMPLANT
RETRACTOR STERILE 25.8CMX11.3 (INSTRUMENTS) IMPLANT
SET IRRIG Y TYPE TUR BLADDER L (SET/KITS/TRAYS/PACK) ×2 IMPLANT
SHEET LAVH (DRAPES) ×2 IMPLANT
SLING SYSTEM SPARC (Sling) ×2 IMPLANT
SURGILUBE 2OZ TUBE FLIPTOP (MISCELLANEOUS) ×2 IMPLANT
SUT SILK 2 0 30  PSL (SUTURE)
SUT SILK 2 0 30 PSL (SUTURE) IMPLANT
SUT VIC AB 2-0 CT1 27 (SUTURE) ×4
SUT VIC AB 2-0 CT1 TAPERPNT 27 (SUTURE) ×2 IMPLANT
SUT VICRYL 4-0 PS2 18IN ABS (SUTURE) ×2 IMPLANT
SYR BULB IRRIGATION 50ML (SYRINGE) ×2 IMPLANT
SYR CONTROL 10ML LL (SYRINGE) ×2 IMPLANT
SYRINGE 10CC LL (SYRINGE) ×2 IMPLANT
TOWEL OR 17X24 6PK STRL BLUE (TOWEL DISPOSABLE) ×2 IMPLANT
TRAY DSU PREP LF (CUSTOM PROCEDURE TRAY) ×2 IMPLANT
TUBE CONNECTING 12X1/4 (SUCTIONS) ×4 IMPLANT
WATER STERILE IRR 3000ML UROMA (IV SOLUTION) ×2 IMPLANT
WATER STERILE IRR 500ML POUR (IV SOLUTION) IMPLANT
YANKAUER SUCT BULB TIP NO VENT (SUCTIONS) ×2 IMPLANT

## 2013-02-03 NOTE — Op Note (Signed)
Preoperative diagnosis: Stress urinary incontinence Postoperative diagnosis: Stress urinary incontinence and mild meatal stenosis Procedure: Sling cystourethropexy [facility]) and cystoscopy and dilation of meatal stenosis Surgeon: Dr. Lorin Picket Aldene Hendon  Estimated blood loss: < 50 milliliters  The patient was prepped and draped in the usual fashion. Extra care was taken with leg positioning to minimize the  risk of compartment syndrome, neuropathy, and deep vein thrombosis. Preoperative laboratory tests were normal and preoperative antibiotics were given.  She had mild meatal stenosis requiring gentle dilation with female sounds from 14 to 18 Fr. There was no injury or bleeding  Two less than 1 cm incisions were made 1 fingerbreadth above the symphysis pubis 1.5 cm lateral to the midline. A 2 cm appropriate depth suburethral incision was made underneath the mid urethra after instilling approximately 5 cc of 1% lidocaine mixture. I sharply and bluntly dissected to the urethral vesical angle bilaterally.  With the bladder empty I passed a SPARC needle on top of and along the back of the symphysis pubis staying  on the periosteum and staying lateral using my box technique and delivering the needle onto the pulp of my  index finger bilaterally.  I cystoscoped the patient thoroughly and there was no injury to the bladder or urethra. There was no movement  or indentation of the bladder with movement of the trocar. There was excellent efflux of blue urine bilaterally.  With the bladder emptied I attached the [facility] sling and brought it up through the retropubic space bilaterally. I tensioned it over the fat part of a moderate size Kelly clamp. I cut below the blue dots, irrigated the sheaths, and removed the sheaths. I was very happy with the position and tension of the sling With appropriate hypermobility and no springback effect.  All incisions were irrigated. The sling was cut below the skin  level. I closed the anterior vaginal wall with  running 2-0 Vicryl followed by my interrupted sutures. Interrupted 4-0 Vicryl was used for the abdominal incisions. Dermabond applied.  A catheter plug was used with the Foley catheter as well as a vaginal pack.  The patient was taken to the recovery room and hopefully this procedure will reach her treatment goal.

## 2013-02-03 NOTE — Anesthesia Preprocedure Evaluation (Addendum)
Anesthesia Evaluation  Patient identified by MRN, date of birth, ID band Patient awake    Reviewed: Allergy & Precautions, H&P , NPO status , Patient's Chart, lab work & pertinent test results  History of Anesthesia Complications (+) PONV  Airway Mallampati: I TM Distance: >3 FB Neck ROM: Full    Dental no notable dental hx.    Pulmonary neg pulmonary ROS,  breath sounds clear to auscultation  Pulmonary exam normal       Cardiovascular negative cardio ROS  Rhythm:Regular Rate:Normal     Neuro/Psych negative neurological ROS  negative psych ROS   GI/Hepatic negative GI ROS, Neg liver ROS,   Endo/Other  diabetes, Type 2, Oral Hypoglycemic Agents  Renal/GU negative Renal ROS  negative genitourinary   Musculoskeletal negative musculoskeletal ROS (+)   Abdominal   Peds negative pediatric ROS (+)  Hematology negative hematology ROS (+)   Anesthesia Other Findings   Reproductive/Obstetrics negative OB ROS                          Anesthesia Physical Anesthesia Plan  ASA: II  Anesthesia Plan: General   Post-op Pain Management:    Induction: Intravenous  Airway Management Planned: Oral ETT  Additional Equipment:   Intra-op Plan:   Post-operative Plan: Extubation in OR  Informed Consent: I have reviewed the patients History and Physical, chart, labs and discussed the procedure including the risks, benefits and alternatives for the proposed anesthesia with the patient or authorized representative who has indicated his/her understanding and acceptance.   Dental advisory given  Plan Discussed with: CRNA  Anesthesia Plan Comments:         Anesthesia Quick Evaluation

## 2013-02-03 NOTE — Transfer of Care (Signed)
Immediate Anesthesia Transfer of Care Note  Patient: Rebecca Robertson  Procedure(s) Performed: Procedure(s): SPARC PROCEDURE (N/A)  Patient Location: PACU  Anesthesia Type:General  Level of Consciousness: awake, alert  and oriented  Airway & Oxygen Therapy: Patient Spontanous Breathing and Patient connected to face mask oxygen  Post-op Assessment: Report given to PACU RN and Post -op Vital signs reviewed and stable  Post vital signs: Reviewed and stable  Complications: No apparent anesthesia complications

## 2013-02-03 NOTE — Interval H&P Note (Signed)
History and Physical Interval Note:  02/03/2013 7:22 AM  Rebecca Robertson  has presented today for surgery, with the diagnosis of STRESS INCONTINENCE  The various methods of treatment have been discussed with the patient and family. After consideration of risks, benefits and other options for treatment, the patient has consented to  Procedure(s): SPARC PROCEDURE (N/A) as a surgical intervention .  The patient's history has been reviewed, patient examined, no change in status, stable for surgery.  I have reviewed the patient's chart and labs.  Questions were answered to the patient's satisfaction.     [name]

## 2013-02-03 NOTE — Anesthesia Postprocedure Evaluation (Signed)
  Anesthesia Post-op Note  Patient: Rebecca Robertson  Procedure(s) Performed: Procedure(s) (LRB): SPARC PROCEDURE (N/A)  Patient Location: PACU  Anesthesia Type: General  Level of Consciousness: awake and alert   Airway and Oxygen Therapy: Patient Spontanous Breathing  Post-op Pain: mild  Post-op Assessment: Post-op Vital signs reviewed, Patient's Cardiovascular Status Stable, Respiratory Function Stable, Patent Airway and No signs of Nausea or vomiting  Last Vitals:  Filed Vitals:   02/03/13 0900  BP: 165/82  Pulse: 68  Temp:   Resp: 15    Post-op Vital Signs: stable   Complications: No apparent anesthesia complications \

## 2013-02-03 NOTE — Anesthesia Procedure Notes (Signed)
Procedure Name: Intubation Performed by: Burna Cash Pre-anesthesia Checklist: Patient identified, Emergency Drugs available, Suction available and Patient being monitored Patient Re-evaluated:Patient Re-evaluated prior to inductionOxygen Delivery Method: Circle System Utilized Preoxygenation: Pre-oxygenation with 100% oxygen Intubation Type: IV induction Ventilation: Mask ventilation without difficulty Laryngoscope Size: Mac and 3 Grade View: Grade I Tube type: Oral Tube size: 7.0 mm Number of attempts: 1 Airway Equipment and Method: stylet and oral airway Placement Confirmation: ETT inserted through vocal cords under direct vision,  positive ETCO2 and breath sounds checked- equal and bilateral Secured at: 20 cm Tube secured with: Tape Dental Injury: Teeth and Oropharynx as per pre-operative assessment

## 2013-02-06 ENCOUNTER — Encounter (HOSPITAL_BASED_OUTPATIENT_CLINIC_OR_DEPARTMENT_OTHER): Payer: Self-pay | Admitting: Urology

## 2013-02-14 ENCOUNTER — Telehealth: Payer: Self-pay | Admitting: Oncology

## 2013-03-01 ENCOUNTER — Other Ambulatory Visit: Payer: Managed Care, Other (non HMO) | Admitting: Lab

## 2013-03-01 ENCOUNTER — Ambulatory Visit: Payer: Managed Care, Other (non HMO) | Admitting: Oncology

## 2013-03-02 ENCOUNTER — Other Ambulatory Visit: Payer: Managed Care, Other (non HMO) | Admitting: Lab

## 2013-03-02 ENCOUNTER — Ambulatory Visit: Payer: Managed Care, Other (non HMO) | Admitting: Oncology

## 2013-03-03 ENCOUNTER — Other Ambulatory Visit: Payer: Managed Care, Other (non HMO) | Admitting: Lab

## 2013-03-03 ENCOUNTER — Ambulatory Visit: Payer: Managed Care, Other (non HMO) | Admitting: Oncology

## 2013-03-15 ENCOUNTER — Telehealth: Payer: Self-pay | Admitting: *Deleted

## 2013-03-15 ENCOUNTER — Other Ambulatory Visit (HOSPITAL_BASED_OUTPATIENT_CLINIC_OR_DEPARTMENT_OTHER): Payer: PRIVATE HEALTH INSURANCE | Admitting: Lab

## 2013-03-15 ENCOUNTER — Ambulatory Visit (HOSPITAL_BASED_OUTPATIENT_CLINIC_OR_DEPARTMENT_OTHER): Payer: PRIVATE HEALTH INSURANCE | Admitting: Oncology

## 2013-03-15 ENCOUNTER — Encounter: Payer: Self-pay | Admitting: Oncology

## 2013-03-15 VITALS — BP 135/83 | HR 71 | Temp 98.9°F | Resp 20 | Ht 63.5 in | Wt 158.9 lb

## 2013-03-15 DIAGNOSIS — D0512 Intraductal carcinoma in situ of left breast: Secondary | ICD-10-CM

## 2013-03-15 DIAGNOSIS — D0590 Unspecified type of carcinoma in situ of unspecified breast: Secondary | ICD-10-CM

## 2013-03-15 LAB — COMPREHENSIVE METABOLIC PANEL (CC13)
ALT: 34 U/L (ref 0–55)
AST: 24 U/L (ref 5–34)
Albumin: 3.3 g/dL — ABNORMAL LOW (ref 3.5–5.0)
Alkaline Phosphatase: 106 U/L (ref 40–150)
Glucose: 168 mg/dl — ABNORMAL HIGH (ref 70–140)
Potassium: 4.6 mEq/L (ref 3.5–5.1)
Sodium: 138 mEq/L (ref 136–145)
Total Bilirubin: 0.35 mg/dL (ref 0.20–1.20)
Total Protein: 7.2 g/dL (ref 6.4–8.3)

## 2013-03-15 LAB — CBC WITH DIFFERENTIAL/PLATELET
Basophils Absolute: 0.1 10*3/uL (ref 0.0–0.1)
EOS%: 1.9 % (ref 0.0–7.0)
HGB: 12.9 g/dL (ref 11.6–15.9)
MCH: 30.3 pg (ref 25.1–34.0)
MONO%: 8.7 % (ref 0.0–14.0)
NEUT#: 3.6 10*3/uL (ref 1.5–6.5)
RBC: 4.26 10*6/uL (ref 3.70–5.45)
RDW: 13.3 % (ref 11.2–14.5)
lymph#: 1.8 10*3/uL (ref 0.9–3.3)

## 2013-03-15 NOTE — Progress Notes (Signed)
OFFICE PROGRESS NOTE  CC Vick Frees or M.D.  Dorothy Puffer M.D. PhD  [place], PA-C [address] [place] [address]  DIAGNOSIS: 57 year old female with ductal carcinoma in situ of the left breast ( stage 0)  PRIOR THERAPY:  #1 dose postlumpectomy after she was found to have clusters of microcalcifications within the upper outer quadrant of the left breast final pathology revealed a high grade ductal carcinoma in situ with 4 sentinel nodes that were negative for metastatic disease. Her surgery was performed on 12/02/2010.  #2 she is now status post adjuvant radiation therapy administered from 12/25/2010 2 02/17/2011 2  #3 she also developed mastitis of the left breast she received oral antibiotics as well as anti-inflammatories.  #4 she was then begun on tamoxifen 20 mg daily starting in August 2012 but this was discontinued due to side effects. She was then begun on Aromasin 25 mg daily but she is not able to tolerate that as well due to aches and pains..  #4 begin arimidex1 milligram daily risks and benefits and side effects were discussed with the patient literature was given to her.  CURRENT THERAPY: Arimidex 1 mg daily starting 12/30/2012  INTERVAL HISTORY: CHARICE ZUNO 57 y.o. female returns for followup visit today.overall she seems to be doing well.  she is able to tolerate the Arimidex  well.. . She has no vaginal bleeding no double vision, Or headaches no fevers chills or night sweats. Remainder of the template review of systems is negative.  MEDICAL HISTORY: Past Medical History  Diagnosis Date  . Diabetes mellitus   . Hyperlipidemia   . Vitamin D deficiency   . History of breast cancer ONCOLOGIST-- DR Welton Flakes--  NO RECURRENCE    S/P LEFT PARTIAL MASTECTOMY W/ SLN BX'S  12-02-2010---  DUCTAL CARCINOMA IN SITU--  S/P RADIATION THERAPY ENDED 02-17-2011  . SUI (stress urinary incontinence, female)   . PONV (postoperative nausea and vomiting)      ALLERGIES:  is allergic to compazine.  MEDICATIONS:  Current Outpatient Prescriptions  Medication Sig Dispense Refill  . anastrozole (ARIMIDEX) 1 MG tablet Take 1 mg by mouth daily.      . Azelaic Acid (FINACEA) 15 % cream Apply topically as needed. After skin is thoroughly washed and patted dry, gently but thoroughly massage a thin film of azelaic acid cream into the affected area twice daily, in the morning and evening.      . citalopram (CELEXA) 20 MG tablet Take 20 mg by mouth daily.       . fenofibrate 160 MG tablet Take 160 mg by mouth daily.       . fish oil-omega-3 fatty acids 1000 MG capsule Take 2 g by mouth 2 (two) times daily.       Marland Kitchen glipiZIDE (GLUCOTROL) 10 MG tablet Take 10 mg by mouth daily.       . minocycline (MINOCIN,DYNACIN) 100 MG capsule Take 100 mg by mouth 2 (two) times daily.      . sitaGLIPtan-metformin (JANUMET) 50-1000 MG per tablet Take 1 tablet by mouth 2 (two) times daily with a meal.       No current facility-administered medications for this visit.    SURGICAL HISTORY:  Past Surgical History  Procedure Laterality Date  . Cesarean section      X4  . Bunionectomy    . Partial mastectomy with axillary sentinel lymph node biopsy Left 12-02-2010  . Bladder suspension N/A 02/03/2013    Procedure: [facility] PROCEDURE;  Surgeon: Martina Sinner, MD;  Location: [facility];  Service: Urology;  Laterality: N/A;    REVIEW OF SYSTEMS:   General: fatigue (-), night sweats (-), fever (-), pain (-) Lymph: palpable nodes (-) HEENT: vision changes (-), mucositis (-), gum bleeding (-), epistaxis (-) Cardiovascular: chest pain (-), palpitations (-) Pulmonary: shortness of breath (-), dyspnea on exertion (-), cough (-), hemoptysis (-) GI:  Early satiety (-), melena (-), dysphagia (-), nausea/vomiting (-), diarrhea (-) GU: dysuria (-), hematuria (-), incontinence (-) Musculoskeletal: joint swelling (-), joint pain (-), back pain (-) Neuro: weakness  (-), numbness (-), headache (-), confusion (-) Skin: Rash (-), lesions (-), dryness (-) Psych: depression (-), suicidal/homicidal ideation (-), feeling of hopelessness (-)  Health Maintenance  Mammogram: 10/2011 Colonoscopy: 2009 with 10 year f/u recommended Bone Density Scan: unknown Eye Exam: 1 yr ago Vtamin D Level: unknown Lipid Panel: 2013   PHYSICAL EXAMINATION:  BP 135/83  Pulse 71  Temp(Src) 98.9 F (37.2 C) (Oral)  Resp 20  Ht 5' 3.5" (1.613 m)  Wt 158 lb 14.4 oz (72.077 kg)  BMI 27.7 kg/m2 General: Patient is a well appearing female in no acute distress HEENT: PERRLA, sclerae anicteric no conjunctival pallor, MMM Neck: supple, no palpable adenopathy Lungs: clear to auscultation bilaterally, no wheezes, rhonchi, or rales Cardiovascular: regular rate rhythm, S1, S2, no murmurs, rubs or gallops Abdomen: Soft, non-tender, non-distended, normoactive bowel sounds, no HSM Extremities: warm and well perfused, no clubbing, cyanosis, or edema Skin: No rashes or lesions Neuro: Non-focal Bilateral breasts are examined. The left breast reveals well-healed surgical scar no nodularity no nipple retraction. Right breast no masses or nipple discharge. ECOG PERFORMANCE STATUS: 0 - Asymptomatic   LABORATORY DATA: Lab Results  Component Value Date   WBC 6.1 03/15/2013   HGB 12.9 03/15/2013   HCT 38.0 03/15/2013   MCV 89.1 03/15/2013   PLT 205 03/15/2013      Chemistry      Component Value Date/Time   NA 138 03/15/2013 1018   NA 139 01/31/2013 1322   K 4.6 03/15/2013 1018   K 4.8 01/31/2013 1322   CL 101 01/31/2013 1322   CL 105 12/30/2012 1426   CO2 25 03/15/2013 1018   CO2 29 01/31/2013 1322   BUN 16.0 03/15/2013 1018   BUN 19 01/31/2013 1322   CREATININE 0.8 03/15/2013 1018   CREATININE 0.78 01/31/2013 1322      Component Value Date/Time   CALCIUM 9.7 03/15/2013 1018   CALCIUM 10.3 01/31/2013 1322   ALKPHOS 106 03/15/2013 1018   ALKPHOS 48 01/04/2012 1454   AST 24 03/15/2013 1018    AST 18 01/04/2012 1454   ALT 34 03/15/2013 1018   ALT 19 01/04/2012 1454   BILITOT 0.35 03/15/2013 1018   BILITOT 0.1* 01/04/2012 1454        ASSESSMENT: 57 year old female with:  #1 DCIS clinical stage 0 breast cancer. Originally diagnosed in April 2012 when she underwent her surgery. Overall she is doing well. She is on tamoxifen since August 2012 off tamoxifen in the past 6 months due to elevated triglycerides.  #2 Elevated triglycerides thought to be side effect of Tamoxifen.    #3 she was begun on aromasin 25 mg daily but was stopped due to side effects.  #4 patient was then begun on Arimidex 1 mg daily thus far she is tolerating it well.   PLAN:  #1 patient will continue Arimidex 1 mg daily. She seems to be tolerating  it well.  #2 I will see her back in 6 months time in followup.  All questions were answered. The patient knows to call the clinic with any problems, questions or concerns. We can certainly see the patient much sooner if necessary.  I spent 25 minutes counseling the patient face to face. The total time spent in the appointment was 30 minutes.  Drue Second, MD Medical/Oncology [facility] [phone] (beeper) [phone] (Office)  03/15/2013, 11:40 AM

## 2013-03-15 NOTE — Patient Instructions (Addendum)
Continue arimidex 1 mg daily  I will see you back in 6 months  We discussed exercise to help with aches and pains/hot flashes

## 2013-03-15 NOTE — Telephone Encounter (Signed)
appts made and printed...td 

## 2013-03-16 LAB — VITAMIN D 25 HYDROXY (VIT D DEFICIENCY, FRACTURES): Vit D, 25-Hydroxy: 41 ng/mL (ref 30–89)

## 2013-05-12 LAB — HEMOGLOBIN A1C
Hemoglobin A1C: 7.3
Hemoglobin A1C: 7.7

## 2013-05-12 LAB — MICROALBUMIN, URINE

## 2013-05-29 ENCOUNTER — Ambulatory Visit: Payer: BC Managed Care – PPO

## 2013-05-29 ENCOUNTER — Ambulatory Visit (INDEPENDENT_AMBULATORY_CARE_PROVIDER_SITE_OTHER): Payer: BC Managed Care – PPO | Admitting: Family Medicine

## 2013-05-29 VITALS — BP 102/60 | HR 76 | Temp 98.8°F | Resp 16

## 2013-05-29 DIAGNOSIS — K219 Gastro-esophageal reflux disease without esophagitis: Secondary | ICD-10-CM

## 2013-05-29 DIAGNOSIS — R079 Chest pain, unspecified: Secondary | ICD-10-CM

## 2013-05-29 MED ORDER — OMEPRAZOLE 40 MG PO CPDR: 40.0000 mg | DELAYED_RELEASE_CAPSULE | Freq: Every day | ORAL | Status: DC

## 2013-05-29 NOTE — Patient Instructions (Addendum)
The medicine is called Prilosec.  You can buy this over the counter.  It is 20 mg.  Prescription is 40 mg.  You will take 40 mg a day.   Do this for about 2 weeks.    If the pain doesn't improve improve, you start feeling worse, sweaty, trouble breathing you should come back.

## 2013-05-29 NOTE — Progress Notes (Signed)
Rebecca Robertson is a 57 y.o. female with PMH signficant for breast CA treated with radiation and now on Arimidex who presents to Urgent Care today with complaints of chest and abdominal pain:  1.  Chest/abdominal pain:  Patient with 4 day history of epigastric and midline chest pain.  Associated and worsened with eating, especially today.  No pain with exertion, she has been walking at work and while shopping.  No history of chest pain or abdominal pain in past.    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  States that pain is fairly constant during the day but does not have pain at night.  Pain mostly occurs when she is resting, describes as 6/10 in nature.  Chest pain is non-radiating.  No shortness of breath, diaphoresis, weakness, nausea.  No anorexia.  Describes as sharp shooting pain in chest and abdomen.     Never smoker.  Did have episodes yesterday morning of coughing up "yellow phelgm" but only yesterday AM.  Otherwise no cough.  Was not having pain at that time, started later that day after eating.    PMH reviewed.  Past Medical History  Diagnosis Date  . Diabetes mellitus   . Hyperlipidemia   . Vitamin D deficiency   . History of breast cancer ONCOLOGIST-- DR Welton Flakes--  NO RECURRENCE    S/P LEFT PARTIAL MASTECTOMY W/ SLN BX'S  12-02-2010---  DUCTAL CARCINOMA IN SITU--  S/P RADIATION THERAPY ENDED 02-17-2011  . SUI (stress urinary incontinence, female)   . PONV (postoperative nausea and vomiting)    Past Surgical History  Procedure Laterality Date  . Cesarean section      X4  . Bunionectomy    . Partial mastectomy with axillary sentinel lymph node biopsy Left 12-02-2010  . Bladder suspension N/A 02/03/2013    Procedure: [facility] PROCEDURE;  Surgeon: Martina Sinner, MD;  [facility]: [facility];  Service: Urology;  Laterality: N/A;    Medications reviewed. Current Outpatient Prescriptions  Medication Sig Dispense Refill  . citalopram (CELEXA) 20 MG  tablet Take 20 mg by mouth daily.       . fenofibrate 160 MG tablet Take 160 mg by mouth daily.       Marland Kitchen glipiZIDE (GLUCOTROL) 10 MG tablet Take 10 mg by mouth daily.       . metFORMIN (GLUCOPHAGE) 500 MG tablet Take 500 mg by mouth 2 (two) times daily with a meal.      . anastrozole (ARIMIDEX) 1 MG tablet Take 1 mg by mouth daily.      . Azelaic Acid (FINACEA) 15 % cream Apply topically as needed. After skin is thoroughly washed and patted dry, gently but thoroughly massage a thin film of azelaic acid cream into the affected area twice daily, in the morning and evening.      . Empagliflozin (JARDIANCE) 10 MG TABS Take by mouth 1 day or 1 dose.      . fish oil-omega-3 fatty acids 1000 MG capsule Take 2 g by mouth 2 (two) times daily.       . minocycline (MINOCIN,DYNACIN) 100 MG capsule Take 100 mg by mouth 2 (two) times daily.      Marland Kitchen omeprazole (PRILOSEC) 40 MG capsule Take 1 capsule (40 mg total) by mouth daily.  30 capsule  3  . sitaGLIPtan-metformin (JANUMET) 50-1000 MG per tablet Take 1 tablet by mouth 2 (two) times daily with a meal.  No current facility-administered medications for this visit.    ROS as above otherwise neg.  No palpitations, SOB, Fever, Chills, N/V/D.   [facility] reading (PRIMARY) by  Dr. Gwendolyn Grant:  No acute cardiopulmonary disease noted.  Appears to be some scarring over Left breast where she had radiation  EKG:  Obtained prior to my examination of patient.  NSR, no Q waves, no ST changes  Physical Exam:  BP 102/60  Pulse 76  Temp(Src) 98.8 F (37.1 C)  Resp 16  SpO2 100% Gen:  Alert, cooperative patient who appears stated age in no acute distress.  Lying on examination table in gown.  Vital signs reviewed. HEENT: EOMI,  MMM Pulm:  Clear to auscultation bilaterally with good air movement.  No wheezes or rales noted.   Cardiac:  Regular rate and rhythm without murmur auscultated.  Good S1/S2. Chest:  NT to palpation Abd:  Soft/nondistended.  Tender to palpation,  mild in epigastrum.  Some mild tenderness LUQ.  No guarding or rebound.  Good bowel sounds throughout all four quadrants.  No masses noted.  Exts: Non edematous BL  LE, warm and well perfused.   Assessment and Plan:  1.  Chest pain:  - evaluated patient again after CXR.  She was sitting on side of exam table, fully dressed, and feeling better.   - appears to be GERD based on symptoms, possibly esophageal spasm - Will treat as such - Very atypical for any cardiac pain.  Does have history of BrCA, and DM2. She may benefit from stress test in future if she begins to have any symptoms indicative of stable angina.   - Reviewed EKG, no concerning findings.  Would expect to see something based on the length of her symptoms (~4 days) if cardiac in origina.  -CXR negative -- some scarring which is to be expected with history of radiation - Strict red flags provided verbally and written.   - To FU here if pain doesn't improve,  or if worsens or associated with SOB, diaphoresis, nausea/vomiting to go to ED.  Needs to see PCP within next 2 weeks to ensure continued improvement.    Tobey Grim, MD

## 2013-05-30 ENCOUNTER — Telehealth: Payer: Self-pay

## 2013-05-30 ENCOUNTER — Telehealth: Payer: Self-pay | Admitting: Family Medicine

## 2013-05-30 NOTE — Telephone Encounter (Signed)
Called and spoke to Rebecca Robertson.  She is feeling better after taking the Prilosec.  I relayed the information regarding her chest xray and she was heartened to hear this.  Told her she should follow up to with PCP later this week to ensure continued improvement.  Expressed understanding.

## 2013-05-30 NOTE — Telephone Encounter (Signed)
Tried to call patient but had to leave message.  Wanted to discuss normal CXR exceptfor some scarring which we talked about from her radiation.  Also wanted to make sure she was feeling better today.

## 2013-05-30 NOTE — Telephone Encounter (Signed)
PATIENT STATES SHE CAME INTO THE OFFICE TO SEE DR. Gwendolyn Grant ON Monday NIGHT FOR CHEST PAIN. HE DID A CHEST X-RAY AND TOLD HER HE WOULD SEND IT OFF FOR A 2ND OPINION. HE SAID HE WOULD CALL HER BACK TODAY AND SHE HAS NOT HEARD ANYTHING BACK. HE SAID HE THOUGHT HE SAW SOMETHING, SO SHE HAS BEEN VERY WORRIED. SHE WOULD LIKE TO GET A CALL BACK AS SOON AS POSSIBLE. BEST PHONE [phone] (HOME)   PHARMACY CHOICE IS [facility] [phone]      MBC

## 2013-05-30 NOTE — Telephone Encounter (Signed)
Tried to call patient but had to leave message. Wanted to discuss that radiologist read x-ray as normal CXR exceptfor some scarring which we talked about being from her radiation. Also wanted to make sure she was feeling better today.

## 2013-06-01 NOTE — Telephone Encounter (Signed)
Unclear if patient received new patient information.  Called and left message.

## 2013-06-13 ENCOUNTER — Encounter (INDEPENDENT_AMBULATORY_CARE_PROVIDER_SITE_OTHER): Payer: PRIVATE HEALTH INSURANCE | Admitting: General Surgery

## 2013-08-28 ENCOUNTER — Other Ambulatory Visit (INDEPENDENT_AMBULATORY_CARE_PROVIDER_SITE_OTHER): Payer: Self-pay | Admitting: General Surgery

## 2013-08-28 DIAGNOSIS — Z853 Personal history of malignant neoplasm of breast: Secondary | ICD-10-CM

## 2013-09-11 ENCOUNTER — Telehealth: Payer: Self-pay | Admitting: Oncology

## 2013-09-11 ENCOUNTER — Encounter: Payer: Self-pay | Admitting: Oncology

## 2013-09-11 ENCOUNTER — Other Ambulatory Visit (HOSPITAL_BASED_OUTPATIENT_CLINIC_OR_DEPARTMENT_OTHER): Payer: BC Managed Care – PPO

## 2013-09-11 ENCOUNTER — Encounter (INDEPENDENT_AMBULATORY_CARE_PROVIDER_SITE_OTHER): Payer: Self-pay

## 2013-09-11 ENCOUNTER — Ambulatory Visit (HOSPITAL_BASED_OUTPATIENT_CLINIC_OR_DEPARTMENT_OTHER): Payer: BC Managed Care – PPO | Admitting: Oncology

## 2013-09-11 VITALS — BP 128/68 | HR 85 | Temp 98.3°F | Resp 18 | Ht 63.0 in | Wt 152.3 lb

## 2013-09-11 DIAGNOSIS — D0512 Intraductal carcinoma in situ of left breast: Secondary | ICD-10-CM

## 2013-09-11 DIAGNOSIS — D0590 Unspecified type of carcinoma in situ of unspecified breast: Secondary | ICD-10-CM

## 2013-09-11 DIAGNOSIS — D0510 Intraductal carcinoma in situ of unspecified breast: Secondary | ICD-10-CM

## 2013-09-11 LAB — COMPREHENSIVE METABOLIC PANEL (CC13)
ALT: 21 U/L (ref 0–55)
ANION GAP: 13 meq/L — AB (ref 3–11)
AST: 18 U/L (ref 5–34)
Albumin: 3.8 g/dL (ref 3.5–5.0)
Alkaline Phosphatase: 77 U/L (ref 40–150)
BUN: 22.7 mg/dL (ref 7.0–26.0)
CALCIUM: 9.5 mg/dL (ref 8.4–10.4)
CHLORIDE: 107 meq/L (ref 98–109)
CO2: 22 meq/L (ref 22–29)
CREATININE: 0.8 mg/dL (ref 0.6–1.1)
Glucose: 191 mg/dl — ABNORMAL HIGH (ref 70–140)
Potassium: 3.8 mEq/L (ref 3.5–5.1)
Sodium: 142 mEq/L (ref 136–145)
Total Bilirubin: <0.2 mg/dL (ref 0.20–1.20)
Total Protein: 7.4 g/dL (ref 6.4–8.3)

## 2013-09-11 LAB — CBC WITH DIFFERENTIAL/PLATELET
BASO%: 1.1 % (ref 0.0–2.0)
BASOS ABS: 0.1 10*3/uL (ref 0.0–0.1)
EOS%: 1.7 % (ref 0.0–7.0)
Eosinophils Absolute: 0.1 10*3/uL (ref 0.0–0.5)
HEMATOCRIT: 38.8 % (ref 34.8–46.6)
HEMOGLOBIN: 12.8 g/dL (ref 11.6–15.9)
LYMPH#: 1.8 10*3/uL (ref 0.9–3.3)
LYMPH%: 28.5 % (ref 14.0–49.7)
MCH: 29.7 pg (ref 25.1–34.0)
MCHC: 33 g/dL (ref 31.5–36.0)
MCV: 90 fL (ref 79.5–101.0)
MONO#: 0.5 10*3/uL (ref 0.1–0.9)
MONO%: 7.6 % (ref 0.0–14.0)
NEUT#: 3.9 10*3/uL (ref 1.5–6.5)
NEUT%: 61.1 % (ref 38.4–76.8)
Platelets: 229 10*3/uL (ref 145–400)
RBC: 4.31 10*6/uL (ref 3.70–5.45)
RDW: 13.5 % (ref 11.2–14.5)
WBC: 6.4 10*3/uL (ref 3.9–10.3)

## 2013-09-11 NOTE — Progress Notes (Signed)
OFFICE PROGRESS NOTE  CC Jill Alexanders or M.D.  Kyung Rudd M.D. PhD  No primary provider on file. No primary provider on file.  DIAGNOSIS: 58 year old female with ductal carcinoma in situ of the left breast ( stage 0)  PRIOR THERAPY:  #1 dose postlumpectomy after she was found to have clusters of microcalcifications within the upper outer quadrant of the left breast final pathology revealed a high grade ductal carcinoma in situ with 4 sentinel nodes that were negative for metastatic disease. Her surgery was performed on 12/02/2010.  #2 she is now status post adjuvant radiation therapy administered from 12/25/2010 2 02/17/2011 2  #3 she also developed mastitis of the left breast she received oral antibiotics as well as anti-inflammatories.  #4 she was then begun on tamoxifen 20 mg daily starting in August 2012 but this was discontinued due to side effects. She was then begun on Aromasin 25 mg daily but she is not able to tolerate that as well due to aches and pains..  #4  arimidex1 milligram daily risks and benefits and side effects were discussed with the patient begun 12/2012  CURRENT THERAPY: Arimidex 1 mg daily starting 12/30/2012  INTERVAL HISTORY: ZEYNAB KLETT 57 y.o. female returns for followup visit today.overall she seems to be doing well.  she is able to tolerate the Arimidex  well.. . She has no vaginal bleeding no double vision, Or headaches no fevers chills or night sweats. Remainder of the 10 poitn review of systems is negative.  MEDICAL HISTORY: Past Medical History  Diagnosis Date  . Diabetes mellitus   . Hyperlipidemia   . Vitamin D deficiency   . History of breast cancer ONCOLOGIST-- DR Humphrey Rolls--  NO RECURRENCE    S/P LEFT PARTIAL MASTECTOMY W/ SLN BX'S  12-02-2010---  DUCTAL CARCINOMA IN SITU--  S/P RADIATION THERAPY ENDED 02-17-2011  . SUI (stress urinary incontinence, female)   . PONV (postoperative nausea and vomiting)     ALLERGIES:  is allergic to  compazine.  MEDICATIONS:  Current Outpatient Prescriptions  Medication Sig Dispense Refill  . anastrozole (ARIMIDEX) 1 MG tablet Take 1 mg by mouth daily.      . Azelaic Acid (FINACEA) 15 % cream Apply topically as needed. After skin is thoroughly washed and patted dry, gently but thoroughly massage a thin film of azelaic acid cream into the affected area twice daily, in the morning and evening.      . citalopram (CELEXA) 20 MG tablet Take 20 mg by mouth daily.       . Empagliflozin (JARDIANCE) 10 MG TABS Take by mouth 1 day or 1 dose.      . fenofibrate 160 MG tablet Take 160 mg by mouth daily.       Marland Kitchen FLUOCINOLONE ACETONIDE BODY 0.01 % OIL Apply topically at bedtime as needed.      Marland Kitchen glipiZIDE (GLUCOTROL) 10 MG tablet Take 10 mg by mouth daily.       . metFORMIN (GLUCOPHAGE) 500 MG tablet Take 500 mg by mouth 2 (two) times daily with a meal.      . minocycline (MINOCIN,DYNACIN) 100 MG capsule Take 100 mg by mouth 2 (two) times daily.      Marland Kitchen omeprazole (PRILOSEC) 40 MG capsule Take 1 capsule (40 mg total) by mouth daily.  30 capsule  3  . oxybutynin (DITROPAN) 5 MG tablet Take 5 mg by mouth 2 (two) times daily.       No current facility-administered medications for this visit.  SURGICAL HISTORY:  Past Surgical History  Procedure Laterality Date  . Cesarean section      X4  . Bunionectomy    . Partial mastectomy with axillary sentinel lymph node biopsy Left 12-02-2010  . Bladder suspension N/A 02/03/2013    Procedure: [facility] PROCEDURE;  Surgeon: Reece Packer, MD;  Location: [facility];  Service: Urology;  Laterality: N/A;    REVIEW OF SYSTEMS:   General: fatigue (-), night sweats (-), fever (-), pain (-) Lymph: palpable nodes (-) HEENT: vision changes (-), mucositis (-), gum bleeding (-), epistaxis (-) Cardiovascular: chest pain (-), palpitations (-) Pulmonary: shortness of breath (-), dyspnea on exertion (-), cough (-), hemoptysis (-) GI:  Early satiety  (-), melena (-), dysphagia (-), nausea/vomiting (-), diarrhea (-) GU: dysuria (-), hematuria (-), incontinence (-) Musculoskeletal: joint swelling (-), joint pain (-), back pain (-) Neuro: weakness (-), numbness (-), headache (-), confusion (-) Skin: Rash (-), lesions (-), dryness (-) Psych: depression (-), suicidal/homicidal ideation (-), feeling of hopelessness (-)  Health Maintenance  Mammogram: 10/2011 Colonoscopy: 2009 with 10 year f/u recommended Bone Density Scan: unknown Eye Exam: 1 yr ago Vtamin D Level: unknown Lipid Panel: 2013   PHYSICAL EXAMINATION:  BP 128/68  Pulse 85  Temp(Src) 98.3 F (36.8 C) (Oral)  Resp 18  Ht 5\' 3"  (1.6 m)  Wt 152 lb 4.8 oz (69.083 kg)  BMI 26.99 kg/m2 General: Patient is a well appearing female in no acute distress HEENT: PERRLA, sclerae anicteric no conjunctival pallor, MMM Neck: supple, no palpable adenopathy Lungs: clear to auscultation bilaterally, no wheezes, rhonchi, or rales Cardiovascular: regular rate rhythm, S1, S2, no murmurs, rubs or gallops Abdomen: Soft, non-tender, non-distended, normoactive bowel sounds, no HSM Extremities: warm and well perfused, no clubbing, cyanosis, or edema Skin: No rashes or lesions Neuro: Non-focal Bilateral breasts are examined. The left breast reveals well-healed surgical scar no nodularity no nipple retraction. Right breast no masses or nipple discharge. ECOG PERFORMANCE STATUS: 0 - Asymptomatic   LABORATORY DATA: Lab Results  Component Value Date   WBC 6.4 09/11/2013   HGB 12.8 09/11/2013   HCT 38.8 09/11/2013   MCV 90.0 09/11/2013   PLT 229 09/11/2013      Chemistry      Component Value Date/Time   NA 138 03/15/2013 1018   NA 139 01/31/2013 1322   K 4.6 03/15/2013 1018   K 4.8 01/31/2013 1322   CL 101 01/31/2013 1322   CL 105 12/30/2012 1426   CO2 25 03/15/2013 1018   CO2 29 01/31/2013 1322   BUN 16.0 03/15/2013 1018   BUN 19 01/31/2013 1322   CREATININE 0.8 03/15/2013 1018   CREATININE  0.78 01/31/2013 1322      Component Value Date/Time   CALCIUM 9.7 03/15/2013 1018   CALCIUM 10.3 01/31/2013 1322   ALKPHOS 106 03/15/2013 1018   ALKPHOS 48 01/04/2012 1454   AST 24 03/15/2013 1018   AST 18 01/04/2012 1454   ALT 34 03/15/2013 1018   ALT 19 01/04/2012 1454   BILITOT 0.35 03/15/2013 1018   BILITOT 0.1* 01/04/2012 1454        ASSESSMENT: 58 year old female with:  #1 DCIS clinical stage 0 breast cancer. Originally diagnosed in April 2012 when she underwent her surgery. Overall she is doing well. She is on tamoxifen since August 2012 off tamoxifen in the past 6 months due to elevated triglycerides.  #2 Elevated triglycerides thought to be side effect of Tamoxifen.    #3  she was begun on aromasin 25 mg daily but was stopped due to side effects.  #4 patient was then begun on Arimidex 1 mg daily thus far she is tolerating it well.  #5 patient has never had a bone density will set this up   PLAN:  #1 patient will continue Arimidex 1 mg daily. She seems to be tolerating it well.  #2 Bone density and mammogram in March  #2 I will see her back in 6 months time in followup.  All questions were answered. The patient knows to call the clinic with any problems, questions or concerns. We can certainly see the patient much sooner if necessary.  I spent 15 minutes counseling the patient face to face. The total time spent in the appointment was 30 minutes.  Marcy Panning, MD Medical/Oncology [facility] [phone] (beeper) [phone] (Office)  09/11/2013, 3:30 PM

## 2013-10-02 ENCOUNTER — Other Ambulatory Visit: Payer: Self-pay | Admitting: Family Medicine

## 2013-10-02 ENCOUNTER — Other Ambulatory Visit (HOSPITAL_COMMUNITY)
Admission: RE | Admit: 2013-10-02 | Discharge: 2013-10-02 | Disposition: A | Discharge status: Home / Self Care | Payer: BC Managed Care – PPO | Source: Ambulatory Visit | Attending: Family Medicine | Admitting: Family Medicine

## 2013-10-02 DIAGNOSIS — Z01419 Encounter for gynecological examination (general) (routine) without abnormal findings: Secondary | ICD-10-CM | POA: Insufficient documentation

## 2013-10-20 ENCOUNTER — Ambulatory Visit (INDEPENDENT_AMBULATORY_CARE_PROVIDER_SITE_OTHER): Payer: BC Managed Care – PPO | Admitting: Emergency Medicine

## 2013-10-20 VITALS — BP 122/74 | HR 64 | Temp 98.8°F | Resp 17 | Ht 65.0 in | Wt 151.0 lb

## 2013-10-20 DIAGNOSIS — J111 Influenza due to unidentified influenza virus with other respiratory manifestations: Secondary | ICD-10-CM

## 2013-10-20 MED ORDER — OSELTAMIVIR PHOSPHATE 75 MG PO CAPS: 75.0000 mg | ORAL_CAPSULE | Freq: Two times a day (BID) | ORAL | Status: DC

## 2013-10-20 MED ORDER — PSEUDOEPHEDRINE-GUAIFENESIN ER 60-600 MG PO TB12: 1.0000 | ORAL_TABLET | Freq: Two times a day (BID) | ORAL | Status: DC

## 2013-10-20 MED ORDER — GUAIFENESIN-CODEINE 100-10 MG/5ML PO SOLN: 5.0000 mL | ORAL | Status: DC | PRN

## 2013-10-20 NOTE — Progress Notes (Signed)
[facility] [address] [place] [address] [phone]  Date:  10/20/2013   Name:  Rebecca Robertson   DOB:  [birth year]   MRN:  [id number]  PCP:  No primary provider on file.    Chief Complaint: Cough, URI and Chest Pain   History of Present Illness:  Rebecca Robertson is a 58 y.o. very pleasant female patient who presents with the following: 2 day history of nasal congestion and mucoid drainage and post nasal drip.  Has cough productive of mucoid sputum.  Persistent cough. No wheezing or shortness of breath.  Fever no chills or sweats.  Myalgias and fatigue.  No improvement with over the counter medications or other home remedies. Denies other complaint or health concern today.   Patient Active Problem List   Diagnosis Date Noted  . DCIS (ductal carcinoma in situ) of breast, left 02/10/2011    Past Medical History  Diagnosis Date  . Diabetes mellitus   . Hyperlipidemia   . Vitamin D deficiency   . History of breast cancer ONCOLOGIST-- DR Humphrey Rolls--  NO RECURRENCE    S/P LEFT PARTIAL MASTECTOMY W/ SLN BX'S  12-02-2010---  DUCTAL CARCINOMA IN SITU--  S/P RADIATION THERAPY ENDED 02-17-2011  . SUI (stress urinary incontinence, female)   . PONV (postoperative nausea and vomiting)     Past Surgical History  Procedure Laterality Date  . Cesarean section      X4  . Bunionectomy    . Partial mastectomy with axillary sentinel lymph node biopsy Left 12-02-2010  . Bladder suspension N/A 02/03/2013    Procedure: [facility] PROCEDURE;  Surgeon: Reece Packer, MD;  [facility]: [facility];  Service: Urology;  Laterality: N/A;    History  Substance Use Topics  . Smoking status: Never Smoker   . Smokeless tobacco: Never Used  . Alcohol Use: No    Family History  Problem Relation Age of Onset  . Cancer Mother     lung  . Heart attack Father   . COPD Father   . Heart disease Father     Allergies  Allergen Reactions  . Compazine Other (See  Comments)    Possible seizure, unable to talk, eyes rolled toward back of head, mouth became dry.    Medication list has been reviewed and updated.  Current Outpatient Prescriptions on File Prior to Visit  Medication Sig Dispense Refill  . anastrozole (ARIMIDEX) 1 MG tablet Take 1 mg by mouth daily.      . Azelaic Acid (FINACEA) 15 % cream Apply topically as needed. After skin is thoroughly washed and patted dry, gently but thoroughly massage a thin film of azelaic acid cream into the affected area twice daily, in the morning and evening.      . citalopram (CELEXA) 20 MG tablet Take 20 mg by mouth daily.       . Empagliflozin (JARDIANCE) 10 MG TABS Take by mouth 1 day or 1 dose.      . fenofibrate 160 MG tablet Take 160 mg by mouth daily.       Marland Kitchen FLUOCINOLONE ACETONIDE BODY 0.01 % OIL Apply topically at bedtime as needed.      Marland Kitchen glipiZIDE (GLUCOTROL) 10 MG tablet Take 10 mg by mouth daily.       . metFORMIN (GLUCOPHAGE) 500 MG tablet Take 500 mg by mouth 2 (two) times daily with a meal.      . minocycline (MINOCIN,DYNACIN) 100 MG capsule Take  100 mg by mouth 2 (two) times daily.      Marland Kitchen omeprazole (PRILOSEC) 40 MG capsule Take 1 capsule (40 mg total) by mouth daily.  30 capsule  3  . oxybutynin (DITROPAN) 5 MG tablet Take 5 mg by mouth 2 (two) times daily.       No current facility-administered medications on file prior to visit.    Review of Systems:  As per HPI, otherwise negative.    Physical Examination: Filed Vitals:   10/20/13 0818  BP: 122/74  Pulse: 64  Temp: 98.8 F (37.1 C)  Resp: 17   Filed Vitals:   10/20/13 0818  Height: 5\' 5"  (1.651 m)  Weight: 151 lb (68.493 kg)   Body mass index is 25.13 kg/(m^2). Ideal Body Weight: Weight in (lb) to have BMI = 25: 149.9  GEN: WDWN, NAD, Non-toxic, A & O x 3 HEENT: Atraumatic, Normocephalic. Neck supple. No masses, No LAD. Ears and Nose: No external deformity. CV: RRR, No M/G/R. No JVD. No thrill. No extra heart  sounds. PULM: CTA B, no wheezes, crackles, rhonchi. No retractions. No resp. distress. No accessory muscle use. ABD: S, NT, ND, +BS. No rebound. No HSM. EXTR: No c/c/e NEURO Normal gait.  PSYCH: Normally interactive. Conversant. Not depressed or anxious appearing.  Calm demeanor.    Assessment and Plan: Influenza tamiflu mucinex d Phen c cod  Signed,  Ellison Carwin, MD

## 2013-10-20 NOTE — Patient Instructions (Signed)
Dystonic Reaction A dystonic reaction is generally a side effect to a particular medication. Often the medications are used to treat psychological or psychiatric conditions. They often come from other common medications such as antihistamines, Cimetadine, Doxepin and Bromocriptine. The reasons these reactions occur is the normal patterns of our nerve receptors are upset by a particular medication and the imbalance causes multiple types of muscle spasm. This not a drug allergy. It is your own particular response to the particular medication you have taken. DIAGNOSIS  This diagnosis (learning what is wrong) is made by the obvious symptoms (problems) of contraction of multiple muscles in the body and the usual rapid response to treatment. Because of multiple muscle groups contracting, it is associated with abnormal movements of the face, tongue, neck, abdomen (belly), back and with bizarre grimacing. This illness is rarely life threatening and generally responds within minutes to Benadryl, Cogentin, or Valium. Although sometimes frightening, it is usually over in minutes. If the reaction is not a reaction to medications, additional work up may have to be done to rule out other causes. HOME CARE INSTRUCTIONS   Generally, after the reaction is over, there will be no return of the disorder.  Avoid use of medications in the future which were thought to be the cause of this.  Do not drive or perform tasks after treatment until medications used to treat have worn off, or until OK'D by your caregiver.  See your caregiver if there is a return of the symptoms which brought you to your caregiver or emergency department. MAKE SURE YOU:   Understand these instructions.  Will watch your condition.  Will get help right away if you are not doing well or get worse. Document Released: 07/31/2000 Document Revised: 10/26/2011 Document Reviewed: 03/21/2008 [facility] Patient Information 2014 [place]. Influenza  A (H1N1) H1N1 formerly called "swine flu" is a new influenza virus causing sickness in people. The H1N1 virus is different from seasonal influenza viruses. However, the H1N1 symptoms are similar to seasonal influenza and it is spread from person to person. You may be at higher risk for serious problems if you have underlying serious medical conditions. The CDC and the [facility] are following reported cases around the world. CAUSES   The flu is thought to spread mainly person-to-person through coughing or sneezing of infected people.  A person may become infected by touching something with the virus on it and then touching their mouth or nose. SYMPTOMS   Fever.  Headache.  Tiredness.  Cough.  Sore throat.  Runny or stuffy nose.  Body aches.  Diarrhea and vomiting These symptoms are referred to as "flu-like symptoms." A lot of different illnesses, including the common cold, may have similar symptoms. DIAGNOSIS   There are tests that can tell if you have the H1N1 virus.  Confirmed cases of H1N1 will be reported to the state or local health department.  A doctor's exam may be needed to tell whether you have an infection that is a complication of the flu. HOME CARE INSTRUCTIONS   Stay informed. Visit the [facility] website for current recommendations. Visit [employer]. You may also call 1-800-CDC-INFO (201) 554-5769).  Get help early if you develop any of the above symptoms.  If you are at high risk from complications of the flu, talk to your caregiver as soon as you develop flu-like symptoms. Those at higher risk for complications include:  People 65 years or older.  People with chronic medical conditions.  Pregnant women.  Young  children.  Your caregiver may recommend antiviral medicine to help treat the flu.  If you get the flu, get plenty of rest, drink enough water and fluids to keep your urine clear or pale yellow, and avoid using alcohol or  tobacco.  You may take over-the-counter medicine to relieve the symptoms of the flu if your caregiver approves. (Never give aspirin to children or teenagers who have flu-like symptoms, particularly fever). TREATMENT  If you do get sick, antiviral drugs are available. These drugs can make your illness milder and make you feel better faster. Treatment should start soon after illness starts. It is only effective if taken within the first day of becoming ill. Only your caregiver can prescribe antiviral medication.  PREVENTION   Cover your nose and mouth with a tissue or your arm when you cough or sneeze. Throw the tissue away.  Wash your hands often with soap and warm water, especially after you cough or sneeze. Alcohol-based cleaners are also effective against germs.  Avoid touching your eyes, nose or mouth. This is one way germs spread.  Try to avoid contact with sick people. Follow public health advice regarding school closures. Avoid crowds.  Stay home if you get sick. Limit contact with others to keep from infecting them. People infected with the H1N1 virus may be able to infect others anywhere from 1 day before feeling sick to 5-7 days after getting flu symptoms.  An H1N1 vaccine is available to help protect against the virus. In addition to the H1N1 vaccine, you will need to be vaccinated for seasonal influenza. The H1N1 and seasonal vaccines may be given on the same day. The CDC especially recommends the H1N1 vaccine for:  Pregnant women.  People who live with or care for children younger than 11 months of age.  Health care and emergency services personnel.  Persons between the ages of 52 months through 74 years of age.  People from ages 68 through 57 years who are at higher risk for H1N1 because of chronic health disorders or immune system problems. FACEMASKS In community and home settings, the use of facemasks and N95 respirators are not normally recommended. In certain  circumstances, a facemask or N95 respirator may be used for persons at increased risk of severe illness from influenza. Your caregiver can give additional recommendations for facemask use. IN CHILDREN, EMERGENCY WARNING SIGNS THAT NEED URGENT MEDICAL CARE:  Fast breathing or trouble breathing.  Bluish skin color.  Not drinking enough fluids.  Not waking up or not interacting normally.  Being so fussy that the child does not want to be held.  Your child has an oral temperature above 102 F (38.9 C), not controlled by medicine.  Your baby is older than 3 months with a rectal temperature of 102 F (38.9 C) or higher.  Your baby is 36 months old or younger with a rectal temperature of 100.4 F (38 C) or higher.  Flu-like symptoms improve but then return with fever and worse cough. IN ADULTS, EMERGENCY WARNING SIGNS THAT NEED URGENT MEDICAL CARE:  Difficulty breathing or shortness of breath.  Pain or pressure in the chest or abdomen.  Sudden dizziness.  Confusion.  Severe or persistent vomiting.  Bluish color.  You have a oral temperature above 102 F (38.9 C), not controlled by medicine.  Flu-like symptoms improve but return with fever and worse cough. SEEK IMMEDIATE MEDICAL CARE IF:  You or someone you know is experiencing any of the above symptoms. When you arrive  at the emergency center, report that you think you have the flu. You may be asked to wear a mask and/or sit in a secluded area to protect others from getting sick. MAKE SURE YOU:   Understand these instructions.  Will watch your condition.  Will get help right away if you are not doing well or get worse. Some of this information courtesy of the CDC.  Document Released: 01/20/2008 Document Revised: 10/26/2011 Document Reviewed: 01/20/2008 [facility] Patient Information 2014 [place], [place].

## 2013-11-02 ENCOUNTER — Other Ambulatory Visit: Payer: Self-pay

## 2013-11-02 ENCOUNTER — Other Ambulatory Visit (INDEPENDENT_AMBULATORY_CARE_PROVIDER_SITE_OTHER): Payer: Self-pay | Admitting: General Surgery

## 2013-11-02 DIAGNOSIS — Z853 Personal history of malignant neoplasm of breast: Secondary | ICD-10-CM

## 2013-11-08 ENCOUNTER — Ambulatory Visit
Admission: RE | Admit: 2013-11-08 | Discharge: 2013-11-08 | Disposition: A | Discharge status: Home / Self Care | Payer: BC Managed Care – PPO | Source: Ambulatory Visit | Attending: General Surgery | Admitting: General Surgery

## 2013-11-08 DIAGNOSIS — Z853 Personal history of malignant neoplasm of breast: Secondary | ICD-10-CM

## 2013-12-18 ENCOUNTER — Telehealth: Payer: Self-pay

## 2013-12-18 NOTE — Telephone Encounter (Signed)
She can try glucosamine chondroitin, however, if its that severe and it just started she could try stopping therapy x 1 month and changing to a different medication.  Thanks, [place]

## 2013-12-18 NOTE — Telephone Encounter (Signed)
Per note below, pt can try glucosamine chondroitan or hold arimidex for one month.  Pt will try both and will be back in for appt in July.  Pt voiced understanding.      Minette Headland, NP at 12/18/2013 2:03 PM     Status: Signed        She can try glucosamine chondroitin, however, if its that severe and it just started she could try stopping therapy x 1 month and changing to a different medication.

## 2013-12-18 NOTE — Telephone Encounter (Signed)
Returned husband.  Pt having problems with extreme joint pain from Arimidex.  Pt requested husband call and ask for help with side effects.  Routed to [facility].

## 2014-01-15 ENCOUNTER — Encounter: Payer: Self-pay | Admitting: *Deleted

## 2014-01-15 ENCOUNTER — Other Ambulatory Visit: Payer: Self-pay | Admitting: *Deleted

## 2014-01-15 ENCOUNTER — Telehealth: Payer: Self-pay | Admitting: *Deleted

## 2014-01-15 DIAGNOSIS — D0510 Intraductal carcinoma in situ of unspecified breast: Secondary | ICD-10-CM

## 2014-01-15 NOTE — Telephone Encounter (Signed)
Patient called stating that she needs to cancel her appt in July 2015 with lab and Dr Humphrey Rolls to make it an annual visit, as Dr Humphrey Rolls gave her this option at the last visit. Informed patient that Dr Humphrey Rolls is no longer with our practice and we will be reassigning her care to another provider. Reviewed past plan, patient states she was able to get Glucosamine and it helped "tremendously" with her joint pain. She did not have to discontinue her Arimidex and continues to do well with this drug. Also shared name of Raquel Marlon Pel with patient to determine if insurance codes or billing has left her with a high outstanding balance with our practice. She states she has been seeing her primary care MD for all her needs due to the out-of-pocket expense with new specialty rates.

## 2014-01-15 NOTE — Progress Notes (Signed)
Patient calling to r/s visit to annual. Dr Humphrey Rolls no longer at practice, will order new visit for CP2 or Dr Lindi Adie for 08/2014

## 2014-01-17 ENCOUNTER — Telehealth: Payer: Self-pay | Admitting: Hematology and Oncology

## 2014-01-17 NOTE — Telephone Encounter (Signed)
S/w the pt and she is aware of her jan 2016 appts. °

## 2014-02-09 ENCOUNTER — Telehealth: Payer: Self-pay | Admitting: Hematology and Oncology

## 2014-02-09 NOTE — Telephone Encounter (Signed)
RECEIVED MESSAGE FROM [facility] THAT PT WANTS TO CX APPT FOR JULY 2015 BUT WILL KEEP APPT FOR JAN 2016. S/W PT AND PER PT SHE S/W [facility] BUT DOESN'T REMEMBER LAST NAME. PER PT SHE WOULD LIKE TO CX JULY BUT KEEP JAN 2016. APPT CXD PER PT REQUEST. PT HAS APPT FOR JAN W/LB - [facility].

## 2014-02-21 ENCOUNTER — Telehealth (INDEPENDENT_AMBULATORY_CARE_PROVIDER_SITE_OTHER): Payer: Self-pay

## 2014-02-21 NOTE — Telephone Encounter (Signed)
DCIS pt of Dr. Zella Richer.  She is calling upset because she can no longer see Dr. Chancy Milroy.  Her insurance has changed and she cannot afford to come back and see Dr. Zella Richer until after the first of next year.  Pt is distressed about not having f/u care.  Reassured the patient I would let Dr. Zella Richer know of her situation and we will try to help any way we can.

## 2014-02-22 NOTE — Telephone Encounter (Signed)
See if one of the other Oncologists or one of their physician extenders can see her.

## 2014-02-26 ENCOUNTER — Other Ambulatory Visit: Payer: BC Managed Care – PPO

## 2014-02-26 ENCOUNTER — Ambulatory Visit: Payer: BC Managed Care – PPO | Admitting: Oncology

## 2014-02-27 NOTE — Telephone Encounter (Signed)
Pt is scheduled to see Dr. Lindi Adie on 09/03/2014.

## 2014-03-15 ENCOUNTER — Other Ambulatory Visit: Payer: Self-pay | Admitting: Oncology

## 2014-03-15 DIAGNOSIS — D0592 Unspecified type of carcinoma in situ of left breast: Secondary | ICD-10-CM

## 2014-03-27 ENCOUNTER — Ambulatory Visit (INDEPENDENT_AMBULATORY_CARE_PROVIDER_SITE_OTHER): Payer: BC Managed Care – PPO | Admitting: Family Medicine

## 2014-03-27 ENCOUNTER — Ambulatory Visit (INDEPENDENT_AMBULATORY_CARE_PROVIDER_SITE_OTHER): Payer: BC Managed Care – PPO

## 2014-03-27 VITALS — BP 110/70 | HR 73 | Temp 98.4°F | Resp 16 | Ht 64.0 in | Wt 149.2 lb

## 2014-03-27 DIAGNOSIS — M79671 Pain in right foot: Secondary | ICD-10-CM

## 2014-03-27 DIAGNOSIS — M79609 Pain in unspecified limb: Secondary | ICD-10-CM

## 2014-03-27 DIAGNOSIS — E118 Type 2 diabetes mellitus with unspecified complications: Secondary | ICD-10-CM

## 2014-03-27 DIAGNOSIS — E119 Type 2 diabetes mellitus without complications: Secondary | ICD-10-CM | POA: Insufficient documentation

## 2014-03-27 NOTE — Progress Notes (Signed)
Subjective:    Patient ID: Rebecca Robertson, female    DOB: 01-Mar-1956, 58 y.o.   MRN: [id number]  HPI [name] is a 58yo female who presents with right foot pain. She notes that she was kicking her pants off and struck her foot on ceramic tile in the bathroom. Date of injury was 03/24/14. She noted associated swelling and bruising immediately following the injury, which persists today. Location of pain is 2nd and 3rd toes on the right. She notes continued toe deformity where the 2nd toe is turned in. She is taking Advil as needed for pain, which he says relieves her symptoms. Walking aggravates her pain. She has not tried any icing elevation. She denies any associated numbness, tingling, rash, or weakness.   Review of Systems Pertinent positives: Right second and third toe pain, bruising, swelling  Pertinent negatives: Numbness, tingling, weakness, rash  11 point review of systems was performed is otherwise negative.   Past Medical History  Diagnosis Date  . Diabetes mellitus   . Hyperlipidemia   . Vitamin D deficiency   . History of breast cancer ONCOLOGIST-- DR Humphrey Rolls--  NO RECURRENCE    S/P LEFT PARTIAL MASTECTOMY W/ SLN BX'S  12-02-2010---  DUCTAL CARCINOMA IN SITU--  S/P RADIATION THERAPY ENDED 02-17-2011  . SUI (stress urinary incontinence, female)   . PONV (postoperative nausea and vomiting)    History   Social History  . Marital Status: Married    Spouse Name: N/A    Number of Children: N/A  . Years of Education: N/A   Social History Main Topics  . Smoking status: Never Smoker   . Smokeless tobacco: Never Used  . Alcohol Use: No  . Drug Use: No  . Sexual Activity: Not Currently   Other Topics Concern  . None   Social History Narrative  . None        Objective:   Physical Exam BP 110/70  Pulse 73  Temp(Src) 98.4 F (36.9 C) (Oral)  Resp 16  Ht 5\' 4"  (1.626 m)  Wt 149 lb 3.2 oz (67.677 kg)  BMI 25.60 kg/m2  SpO2 100% GEN: The patient is  well-developed well-nourished female and in no acute distress.  She is awake alert and oriented x3. SKIN: warm and well-perfused, no rash  EXTR: No lower extremity edema or calf tenderness Neuro: Strength 5/5 globally. Sensation intact throughout. DTRs 2/4 bilaterally. No focal deficits. Vasc: +2 bilateral distal pulses. No edema.  MSK:  Examination of the right foot reveals that the second proximal and distal phalanx is deviated medially towards the great toe. There is associated swelling and bruising of the bilateral second and third toes dorsally. There is otherwise no palpable deformity. She has point tenderness at the base of the second and third proximal toes. She has good capillary refill distally. Range of motion is limited due to pain and swelling.  X-ray right foot: Complete series of the right foot was obtained which revealed overall normal bony alignment. No obvious fracture was seen. Question if occult fracture of the second proximal phalanx located obliquely through the shaft, not involving the joint space, as was seen on one view only, however.     Assessment & Plan:  1. Right foot pain due to injury 03/24/14, question if occult fracture of the right second proximal phalanx.  We discussed the diagnosis with the patient. We will plan to treat her conservatively as if she has a high call to fracture of the right second  proximal phalanx that is nondisplaced. We provided her a cam walker boot today, which relieves her pain. She may continue over-the-counter anti-inflammatory medications as needed for pain. I will see her back in one week for repeat x-rays of the right foot. If she develops any increasing pain, swelling, bruising, numbness, then she should return to clinic sooner or go to the ER. She verbalized understanding and agreement with this plan.  Dr. Linnell Fulling, DO Sports Medicine Fellow  Discussed with Dr. Carlota Raspberry, attending.

## 2014-03-27 NOTE — Patient Instructions (Addendum)
Plan for follow-up on Tuesday, 8/18 at 5:30pm (arrive around 5:00 if possible) and request Dr. Linnell Fulling (Sports Medicine Fellow) at the front desk.  Toe Fracture  Your caregiver has diagnosed you as having a fractured toe. A toe fracture is a break in the bone of a toe. "Buddy taping" is a way of splinting your broken toe, by taping the broken toe to the toe next to it. This "buddy taping" will keep the injured toe from moving beyond normal range of motion. Buddy taping also helps the toe heal in a more normal alignment. It may take 6 to 8 weeks for the toe injury to heal. [place] your toes taped together for as long as directed by your caregiver or until you see a doctor for a follow-up examination. You can change the tape after bathing. Always use a small piece of gauze or cotton between the toes when taping them together. This will help the skin stay dry and prevent infection.  Apply ice to the injury for 15-20 minutes each hour while awake for the first 2 days. Put the ice in a plastic bag and place a towel between the bag of ice and your skin.  After the first 2 days, apply heat to the injured area. Use heat for the next 2 to 3 days. Place a heating pad on the foot or soak the foot in warm water as directed by your caregiver.  Keep your foot elevated as much as possible to lessen swelling.  Wear sturdy, supportive shoes. The shoes should not pinch the toes or fit tightly against the toes.  Your caregiver may prescribe a rigid shoe if your foot is very swollen.  Your may be given crutches if the pain is too great and it hurts too much to walk.  Only take over-the-counter or prescription medicines for pain, discomfort, or fever as directed by your caregiver.  If your caregiver has given you a follow-up appointment, it is very important to keep that appointment. Not keeping the appointment could result in a chronic or permanent injury, pain, and disability. If there  is any problem keeping the appointment, you must call back to this facility for assistance. SEEK MEDICAL CARE IF:    You have increased pain or swelling, not relieved with medications.  The pain does not get better after 1 week.  Your injured toe is cold when the others are warm. SEEK IMMEDIATE MEDICAL CARE IF:   The toe becomes cold, numb, or white.  The toe becomes hot (inflamed) and red. Document Released: 07/31/2000 Document Revised: 10/26/2011 Document Reviewed: 03/19/2008 [facility] Patient Information 2015 [place], [place]. This information is not intended to replace advice given to you by your health care provider. Make sure you discuss any questions you have with your health care provider.

## 2014-03-28 NOTE — Progress Notes (Addendum)
Xray read and patient discussed with Dr. Linnell Fulling. Agree with assessment and plan of care per his note.  XR report reviewed:FINDINGS:  Multiple views of the right foot demonstrate no acute displaced  fracture, subluxation, dislocation, or soft tissue abnormality.  Small plantar calcaneal spur incidentally noted.  IMPRESSION:  No acute radiographic abnormality of the right foot.

## 2014-04-03 ENCOUNTER — Ambulatory Visit (INDEPENDENT_AMBULATORY_CARE_PROVIDER_SITE_OTHER): Payer: BC Managed Care – PPO | Admitting: Family Medicine

## 2014-04-03 ENCOUNTER — Ambulatory Visit (INDEPENDENT_AMBULATORY_CARE_PROVIDER_SITE_OTHER): Payer: BC Managed Care – PPO

## 2014-04-03 VITALS — BP 110/70 | HR 69 | Temp 98.0°F | Resp 16 | Ht 64.0 in | Wt 141.5 lb

## 2014-04-03 DIAGNOSIS — M25579 Pain in unspecified ankle and joints of unspecified foot: Secondary | ICD-10-CM

## 2014-04-03 DIAGNOSIS — M25571 Pain in right ankle and joints of right foot: Secondary | ICD-10-CM

## 2014-04-03 DIAGNOSIS — S92919A Unspecified fracture of unspecified toe(s), initial encounter for closed fracture: Secondary | ICD-10-CM

## 2014-04-03 DIAGNOSIS — IMO0002 Reserved for concepts with insufficient information to code with codable children: Secondary | ICD-10-CM | POA: Insufficient documentation

## 2014-04-03 NOTE — Progress Notes (Signed)
  Rebecca Robertson - 58 y.o. female MRN [id number]  Date of birth: 1956-01-13  SUBJECTIVE:  Including CC & ROS.  The patient is following up for evaluation of: Right Toe Pain: DOI: 03/24/14. Pt kicked ceramic tile in bathroom.  Persistent 3rd toe pain.  Evaluated last week and given fx boot due to extent of discomfort.  She reports continued 3rd toe pain but improver overall swelling and discomfort.  Naproxen only for pain.  Worse at night or after standing/walking for prolonged time.  Denies pain out of proportion, open skin wounds or nail involvement.   HISTORY: Past Medical, Surgical, Social, and Family History Reviewed & Updated per EMR. Pertinent Historical Findings include: Diabetes on oral therapy only; hx of DCIS  DATA REVIEWED: X-ray obtained today:  [facility] reading (PRIMARY) by  Dr. Paulla Fore and Dr. Carlota Raspberry 3 view of right foot reveals fracture of distal metaphysis of proximal phalanx of 3rd toe; area of irregularity consistent with area of maximal tenderness.  ?medial joint involvement but less than 10% if present.  no angulation or displacement.  PHYSICAL EXAM:  VS: BP:110/70 mmHg  HR:69bpm  TEMP:98 F (36.7 C)(Oral)  RESP:98 %  HT:5\' 4"  (162.6 cm)   WT:141 lb 8 oz (64.184 kg)  BMI:24.3 PHYSICAL EXAM: GENERAL:  Adult caucasian  female. In no discomfort; no respiratory distress  PSYCH:  alert and appropriate, good insight  Right foot Exam:        Gen/Palp:  Markedly swollen 3rd digit with slight amount of ecchymosis       ROM/MST:  Limited flexion of 3rd toe, full extension                      NV:  Capillary refill less than 2 seconds                 Tests:  none  ASSESSMENT & PLAN: See problem based charting & AVS for pt instructions.

## 2014-04-03 NOTE — Patient Instructions (Signed)

## 2014-04-03 NOTE — Assessment & Plan Note (Addendum)
Acute condition  - DOI 03/24/2014.  Not seen on X-ray last visit but clinical suspicion High.  Confirmed with X-ray today. 1. Buddy Taping.  Post op shoe for comfort. Discussed metal insole for work if needed.  2. Naproxen PRN > f/u PRN

## 2014-04-06 NOTE — Progress Notes (Signed)
Xray read and patient discussed with Dr. Paulla Fore. Agree with assessment and plan of care per his note. Radiology report WNL, but clinically suspect fx. Agree with postop shoe.

## 2014-04-17 ENCOUNTER — Other Ambulatory Visit: Payer: Self-pay | Admitting: Gastroenterology

## 2014-04-17 ENCOUNTER — Ambulatory Visit
Admission: RE | Admit: 2014-04-17 | Discharge: 2014-04-17 | Disposition: A | Discharge status: Home / Self Care | Payer: BC Managed Care – PPO | Source: Ambulatory Visit | Attending: Gastroenterology | Admitting: Gastroenterology

## 2014-04-17 DIAGNOSIS — K567 Ileus, unspecified: Secondary | ICD-10-CM

## 2014-04-20 ENCOUNTER — Other Ambulatory Visit: Payer: Self-pay | Admitting: Gastroenterology

## 2014-04-20 LAB — HM COLONOSCOPY

## 2014-05-14 ENCOUNTER — Other Ambulatory Visit: Payer: Self-pay | Admitting: Gastroenterology

## 2014-05-14 DIAGNOSIS — R102 Pelvic and perineal pain: Secondary | ICD-10-CM

## 2014-05-16 ENCOUNTER — Other Ambulatory Visit: Payer: BC Managed Care – PPO

## 2014-05-17 ENCOUNTER — Other Ambulatory Visit: Payer: Self-pay | Admitting: Gastroenterology

## 2014-05-17 ENCOUNTER — Ambulatory Visit
Admission: RE | Admit: 2014-05-17 | Discharge: 2014-05-17 | Disposition: A | Discharge status: Home / Self Care | Payer: BC Managed Care – PPO | Source: Ambulatory Visit | Attending: Gastroenterology | Admitting: Gastroenterology

## 2014-05-17 DIAGNOSIS — R102 Pelvic and perineal pain: Secondary | ICD-10-CM

## 2014-05-17 LAB — CREATININE, SERUM: Creat: 0.4 mg/dL — ABNORMAL LOW (ref 0.50–1.10)

## 2014-05-17 LAB — BUN: BUN: 17 mg/dL (ref 6–23)

## 2014-05-17 MED ORDER — IOHEXOL 300 MG/ML  SOLN
100.0000 mL | Freq: Once | INTRAMUSCULAR | Status: AC | PRN
  Administered 2014-05-17: 100 mL via INTRAVENOUS

## 2014-05-18 ENCOUNTER — Other Ambulatory Visit: Payer: Self-pay | Admitting: Gastroenterology

## 2014-05-18 ENCOUNTER — Other Ambulatory Visit: Payer: BC Managed Care – PPO

## 2014-05-18 DIAGNOSIS — N281 Cyst of kidney, acquired: Secondary | ICD-10-CM

## 2014-05-28 ENCOUNTER — Ambulatory Visit
Admission: RE | Admit: 2014-05-28 | Discharge: 2014-05-28 | Disposition: A | Discharge status: Home / Self Care | Payer: BC Managed Care – PPO | Source: Ambulatory Visit | Attending: Gastroenterology | Admitting: Gastroenterology

## 2014-05-28 DIAGNOSIS — N281 Cyst of kidney, acquired: Secondary | ICD-10-CM

## 2014-05-28 MED ORDER — GADOBENATE DIMEGLUMINE 529 MG/ML IV SOLN
13.0000 mL | Freq: Once | INTRAVENOUS | Status: AC | PRN
Start: 2014-05-28 — End: 2014-05-28
  Administered 2014-05-28: 13 mL via INTRAVENOUS

## 2014-06-01 LAB — CBC AND DIFFERENTIAL
HCT: 38 (ref 36–46)
Hemoglobin: 12.5 (ref 12.0–16.0)
Platelets: 317 (ref 150–399)
WBC: 7

## 2014-07-26 ENCOUNTER — Ambulatory Visit (INDEPENDENT_AMBULATORY_CARE_PROVIDER_SITE_OTHER): Payer: BC Managed Care – PPO

## 2014-07-26 ENCOUNTER — Encounter: Payer: Self-pay | Admitting: Podiatry

## 2014-07-26 ENCOUNTER — Ambulatory Visit (INDEPENDENT_AMBULATORY_CARE_PROVIDER_SITE_OTHER): Payer: BC Managed Care – PPO | Admitting: Podiatry

## 2014-07-26 VITALS — BP 134/69 | HR 62 | Resp 16

## 2014-07-26 DIAGNOSIS — M722 Plantar fascial fibromatosis: Secondary | ICD-10-CM

## 2014-07-26 MED ORDER — TRIAMCINOLONE ACETONIDE 10 MG/ML IJ SUSP
10.0000 mg | Freq: Once | INTRAMUSCULAR | Status: AC
  Administered 2014-07-26: 10 mg

## 2014-07-26 MED ORDER — DICLOFENAC SODIUM 75 MG PO TBEC: 75.0000 mg | DELAYED_RELEASE_TABLET | Freq: Two times a day (BID) | ORAL | Status: DC

## 2014-07-26 NOTE — Patient Instructions (Signed)

## 2014-07-26 NOTE — Progress Notes (Signed)
   Subjective:    Patient ID: Rebecca Robertson, female    DOB: June 12, 1956, 58 y.o.   MRN: [id number]  HPI Comments: "I have pain in the heel"  Patient c/o sharp plantar heel right for several weeks. AM pain. No home treatment.  Foot Pain Associated symptoms include arthralgias and a rash.      Review of Systems  Musculoskeletal: Positive for arthralgias and gait problem.  Skin: Positive for rash.  All other systems reviewed and are negative.      Objective:   Physical Exam        Assessment & Plan:

## 2014-07-28 NOTE — Progress Notes (Signed)
Subjective:     Patient ID: Rebecca Robertson, female   DOB: 03-26-1956, 58 y.o.   MRN: [id number]  HPI patient presents I have heel pain that has been present for a little while worsened over the last few weeks. Worse when getting up in the morning or after periods of sitting but now hurts at all times   Review of Systems  All other systems reviewed and are negative.      Objective:   Physical Exam  Constitutional: She is oriented to person, place, and time.  Cardiovascular: Intact distal pulses.   Musculoskeletal: Normal range of motion.  Neurological: She is oriented to person, place, and time.  Skin: Skin is warm.  Nursing note and vitals reviewed.  neurovascular status intact with muscle strength adequate and range of motion of subtalar midtarsal joint within normal limits. Patient is noted to have good digital perfusion is well oriented 3 with mild equinus condition noted and has discomfort in the plantar aspect of the right heel at the insertional point of the tendon into the calcaneus     Assessment:     Acute plantar fasciitis with inflammatory changes noted    Plan:     H&P and x-rays reviewed and injected the plantar fascia 3 mg Kenalog 5 mg Xylocaine and applied fascially brace with instructions on usage and placed on oral anti-inflammatory agent. Reappoint to recheck again in the next several weeks

## 2014-08-02 ENCOUNTER — Ambulatory Visit: Payer: BC Managed Care – PPO | Admitting: Podiatry

## 2014-08-08 ENCOUNTER — Encounter: Payer: Self-pay | Admitting: Podiatry

## 2014-08-08 ENCOUNTER — Ambulatory Visit (INDEPENDENT_AMBULATORY_CARE_PROVIDER_SITE_OTHER): Payer: BC Managed Care – PPO | Admitting: Podiatry

## 2014-08-08 VITALS — BP 131/73 | HR 78 | Resp 16

## 2014-08-08 DIAGNOSIS — M722 Plantar fascial fibromatosis: Secondary | ICD-10-CM

## 2014-08-08 NOTE — Progress Notes (Signed)
Subjective:     Patient ID: Rebecca Robertson, female   DOB: 26-Oct-1955, 58 y.o.   MRN: [id number]  HPI patient states my heel is some better but still sore and it is hard for me if I have to stand or walk   Review of Systems     Objective:   Physical Exam Neurovascular status is unchanged muscle strength adequate with mild discomfort plantar aspect right heel at the insertional point in the calcaneus with depression of the arch noted upon gait analysis    Assessment:     Reviewed condition and at this time discussed long-term plantar fasciitis and structural flatfoot deformity    Plan:     Reviewed condition and today scanned for custom orthotics to reduce stress against the heel and discussed going with supportive shoe gear ice therapy and not going barefoot.

## 2014-08-16 LAB — HEPATIC FUNCTION PANEL
ALT: 21 (ref 7–35)
AST: 16 (ref 13–35)
Alkaline Phosphatase: 46 (ref 25–125)
Bilirubin, Total: 0.5

## 2014-08-16 LAB — LIPID PANEL
Cholesterol: 178 (ref 0–200)
HDL: 140 — AB (ref 35–70)
LDL Cholesterol: 99
Triglycerides: 209 — AB (ref 40–160)

## 2014-08-16 LAB — MICROALBUMIN, URINE: Microalb, Ur: 10

## 2014-08-16 LAB — TSH: TSH: 1.49 (ref 0.41–5.90)

## 2014-08-16 LAB — BASIC METABOLIC PANEL
BUN: 22 — AB (ref 4–21)
CREATININE: 0.7 (ref 0.5–1.1)
GLUCOSE: 138
Potassium: 4.7 (ref 3.4–5.3)
Sodium: 137 (ref 137–147)

## 2014-08-16 LAB — HEMOGLOBIN A1C: HEMOGLOBIN A1C: 7.3

## 2014-08-23 ENCOUNTER — Other Ambulatory Visit: Payer: Self-pay | Admitting: General Surgery

## 2014-08-23 DIAGNOSIS — Z853 Personal history of malignant neoplasm of breast: Secondary | ICD-10-CM

## 2014-08-24 ENCOUNTER — Other Ambulatory Visit: Payer: Self-pay

## 2014-08-24 ENCOUNTER — Other Ambulatory Visit: Payer: Self-pay | Admitting: General Surgery

## 2014-08-24 DIAGNOSIS — Z853 Personal history of malignant neoplasm of breast: Secondary | ICD-10-CM

## 2014-08-30 ENCOUNTER — Ambulatory Visit (INDEPENDENT_AMBULATORY_CARE_PROVIDER_SITE_OTHER): Payer: BLUE CROSS/BLUE SHIELD | Admitting: Podiatry

## 2014-08-30 ENCOUNTER — Encounter: Payer: Self-pay | Admitting: Podiatry

## 2014-08-30 VITALS — BP 132/65 | HR 62 | Resp 16

## 2014-08-30 DIAGNOSIS — M722 Plantar fascial fibromatosis: Secondary | ICD-10-CM | POA: Diagnosis not present

## 2014-08-30 MED ORDER — TRIAMCINOLONE ACETONIDE 10 MG/ML IJ SUSP
10.0000 mg | Freq: Once | INTRAMUSCULAR | Status: AC
  Administered 2014-08-30: 10 mg

## 2014-08-30 NOTE — Progress Notes (Signed)
MY HEEL STARTED HURTING 2-3 DAYS AGO NOT AS BAD AS BEFORE BUT IT IS COMING BACK.  THE INJECTION WAS ALMOST A MIRACLE.  I ALSO NEED TO PICK UP MY ORTHOTICS.

## 2014-08-30 NOTE — Patient Instructions (Signed)

## 2014-08-31 ENCOUNTER — Other Ambulatory Visit: Payer: BC Managed Care – PPO

## 2014-08-31 ENCOUNTER — Ambulatory Visit: Payer: BC Managed Care – PPO | Admitting: Hematology and Oncology

## 2014-09-03 ENCOUNTER — Ambulatory Visit (HOSPITAL_BASED_OUTPATIENT_CLINIC_OR_DEPARTMENT_OTHER): Payer: BLUE CROSS/BLUE SHIELD | Admitting: Hematology and Oncology

## 2014-09-03 ENCOUNTER — Other Ambulatory Visit (HOSPITAL_BASED_OUTPATIENT_CLINIC_OR_DEPARTMENT_OTHER): Payer: BLUE CROSS/BLUE SHIELD

## 2014-09-03 ENCOUNTER — Telehealth: Payer: Self-pay | Admitting: Hematology and Oncology

## 2014-09-03 DIAGNOSIS — D0510 Intraductal carcinoma in situ of unspecified breast: Secondary | ICD-10-CM

## 2014-09-03 DIAGNOSIS — D0512 Intraductal carcinoma in situ of left breast: Secondary | ICD-10-CM

## 2014-09-03 LAB — COMPREHENSIVE METABOLIC PANEL (CC13)
ALK PHOS: 52 U/L (ref 40–150)
ALT: 22 U/L (ref 0–55)
AST: 16 U/L (ref 5–34)
Albumin: 4.3 g/dL (ref 3.5–5.0)
Anion Gap: 10 mEq/L (ref 3–11)
BILIRUBIN TOTAL: 0.37 mg/dL (ref 0.20–1.20)
BUN: 16.6 mg/dL (ref 7.0–26.0)
CO2: 25 mEq/L (ref 22–29)
CREATININE: 0.8 mg/dL (ref 0.6–1.1)
Calcium: 9.4 mg/dL (ref 8.4–10.4)
Chloride: 106 mEq/L (ref 98–109)
EGFR: 84 mL/min/{1.73_m2} — ABNORMAL LOW (ref >90)
Glucose: 202 mg/dl — ABNORMAL HIGH (ref 70–140)
Potassium: 4 mEq/L (ref 3.5–5.1)
Sodium: 141 mEq/L (ref 136–145)
TOTAL PROTEIN: 7.6 g/dL (ref 6.4–8.3)

## 2014-09-03 LAB — CBC WITH DIFFERENTIAL/PLATELET
BASO%: 1.1 % (ref 0.0–2.0)
Basophils Absolute: 0.1 10*3/uL (ref 0.0–0.1)
EOS ABS: 0.2 10*3/uL (ref 0.0–0.5)
EOS%: 1.9 % (ref 0.0–7.0)
HEMATOCRIT: 42 % (ref 34.8–46.6)
HEMOGLOBIN: 13.3 g/dL (ref 11.6–15.9)
LYMPH%: 27.8 % (ref 14.0–49.7)
MCH: 28.5 pg (ref 25.1–34.0)
MCHC: 31.7 g/dL (ref 31.5–36.0)
MCV: 90.1 fL (ref 79.5–101.0)
MONO#: 0.5 10*3/uL (ref 0.1–0.9)
MONO%: 6.5 % (ref 0.0–14.0)
NEUT#: 4.9 10*3/uL (ref 1.5–6.5)
NEUT%: 62.7 % (ref 38.4–76.8)
Platelets: 263 10*3/uL (ref 145–400)
RBC: 4.67 10*6/uL (ref 3.70–5.45)
RDW: 13.8 % (ref 11.2–14.5)
WBC: 7.7 10*3/uL (ref 3.9–10.3)
lymph#: 2.2 10*3/uL (ref 0.9–3.3)

## 2014-09-03 NOTE — Progress Notes (Signed)
Patient Care Team: Lynne Logan, MD as PCP - General (Family Medicine)  DIAGNOSIS: DCIS left breast CHIEF COMPLIANT: follow-up of DCIS  INTERVAL HISTORY: Rebecca Robertson is a 59 year old lady with above-mentioned history of DCIS left breast had lumpectomy in 2012 followed by radiation and is now on antiestrogen therapy since August 2012. She is finally tolerating Arimidex extremely well without any problems. She had no lumps or nodules in the breasts. She's been feeling very well without any medical issues. Should blood work this morning and is here to discuss the results.  REVIEW OF SYSTEMS:   Constitutional: Denies fevers, chills or abnormal weight loss Eyes: Denies blurriness of vision Ears, nose, mouth, throat, and face: Denies mucositis or sore throat Respiratory: Denies cough, dyspnea or wheezes Cardiovascular: Denies palpitation, chest discomfort or lower extremity swelling Gastrointestinal:  Denies nausea, heartburn or change in bowel habits Skin: Denies abnormal skin rashes Lymphatics: Denies new lymphadenopathy or easy bruising Neurological:Denies numbness, tingling or new weaknesses Behavioral/Psych: Mood is stable, no new changes  Breast:  denies any pain or lumps or nodules in either breasts All other systems were reviewed with the patient and are negative.  I have reviewed the past medical history, past surgical history, social history and family history with the patient and they are unchanged from previous note.  ALLERGIES:  is allergic to compazine.  MEDICATIONS:  Current Outpatient Prescriptions  Medication Sig Dispense Refill  . anastrozole (ARIMIDEX) 1 MG tablet TAKE 1 TABLET BY MOUTH ONCE DAILY. 90 tablet 1  . diclofenac (VOLTAREN) 75 MG EC tablet Take 1 tablet (75 mg total) by mouth 2 (two) times daily. 50 tablet 2  . Empagliflozin (JARDIANCE) 10 MG TABS Take by mouth 1 day or 1 dose.    . fenofibrate 160 MG tablet Take 160 mg by mouth daily.     Marland Kitchen glipiZIDE  (GLUCOTROL) 10 MG tablet Take 10 mg by mouth daily.     . metFORMIN (GLUCOPHAGE) 500 MG tablet Take 500 mg by mouth 2 (two) times daily with a meal.    . minocycline (MINOCIN,DYNACIN) 100 MG capsule Take 100 mg by mouth 2 (two) times daily.    Marland Kitchen oxybutynin (DITROPAN) 5 MG tablet Take 5 mg by mouth 2 (two) times daily.     No current facility-administered medications for this visit.    PHYSICAL EXAMINATION: ECOG PERFORMANCE STATUS: 0 - Asymptomatic  Filed Vitals:   09/03/14 1022  BP: 130/69  Pulse: 65  Temp: 98 F (36.7 C)  Resp: 20   Filed Weights   09/03/14 1022  Weight: 143 lb 8 oz (65.091 kg)    GENERAL:alert, no distress and comfortable SKIN: skin color, texture, turgor are normal, no rashes or significant lesions EYES: normal, Conjunctiva are pink and non-injected, sclera clear OROPHARYNX:no exudate, no erythema and lips, buccal mucosa, and tongue normal  NECK: supple, thyroid normal size, non-tender, without nodularity LYMPH:  no palpable lymphadenopathy in the cervical, axillary or inguinal LUNGS: clear to auscultation and percussion with normal breathing effort HEART: regular rate & rhythm and no murmurs and no lower extremity edema ABDOMEN:abdomen soft, non-tender and normal bowel sounds Musculoskeletal:no cyanosis of digits and no clubbing  NEURO: alert & oriented x 3 with fluent speech, no focal motor/sensory deficits BREAST: No palpable masses or nodules in either right or left breasts. No palpable axillary supraclavicular or infraclavicular adenopathy no breast tenderness or nipple discharge.   LABORATORY DATA:  I have reviewed the data as listed   Chemistry  Component Value Date/Time   NA 141 09/03/2014 1010   NA 139 01/31/2013 1322   K 4.0 09/03/2014 1010   K 4.8 01/31/2013 1322   CL 101 01/31/2013 1322   CL 105 12/30/2012 1426   CO2 25 09/03/2014 1010   CO2 29 01/31/2013 1322   BUN 16.6 09/03/2014 1010   BUN 17 05/17/2014 1449   CREATININE 0.8  09/03/2014 1010   CREATININE 0.40* 05/17/2014 1449   CREATININE 0.78 01/31/2013 1322      Component Value Date/Time   CALCIUM 9.4 09/03/2014 1010   CALCIUM 10.3 01/31/2013 1322   ALKPHOS 52 09/03/2014 1010   ALKPHOS 48 01/04/2012 1454   AST 16 09/03/2014 1010   AST 18 01/04/2012 1454   ALT 22 09/03/2014 1010   ALT 19 01/04/2012 1454   BILITOT 0.37 09/03/2014 1010   BILITOT 0.1* 01/04/2012 1454       Lab Results  Component Value Date   WBC 7.7 09/03/2014   HGB 13.3 09/03/2014   HCT 42.0 09/03/2014   MCV 90.1 09/03/2014   PLT 263 09/03/2014   NEUTROABS 4.9 09/03/2014   ASSESSMENT & PLAN:  DCIS (ductal carcinoma in situ) of breast, left Left breast DCIS status post lumpectomy and radiation followed by antiestrogen therapy initially with tamoxifen started in August 2012 later switched to Aromasin and then changed to Arimidex from May 2014.  Arimidex toxicities: No musculoskeletal pain or discomfort. Patient takes glucosamine which is helping her symptoms.patient will continue Arimidex until August 2017  Breast cancer surveillance: 1. Mammograms to be done in March 2016 2. Breast exam done 09/03/2014 is normal  Return to clinic once a year for follow-up    Orders Placed This Encounter  Procedures  . CBC with Differential    Standing Status: Future     Number of Occurrences:      Standing Expiration Date: 09/03/2015  . Comprehensive metabolic panel (Cmet) - [facility]    Standing Status: Future     Number of Occurrences:      Standing Expiration Date: 09/03/2015   The patient has a good understanding of the overall plan. she agrees with it. She will call with any problems that may develop before her next visit here.   Rulon Eisenmenger, MD

## 2014-09-03 NOTE — Telephone Encounter (Signed)
, °

## 2014-09-03 NOTE — Assessment & Plan Note (Signed)
Left breast DCIS status post lumpectomy and radiation followed by antiestrogen therapy initially with tamoxifen started in August 2012 later switched to Aromasin and then changed to Arimidex from May 2014.  Arimidex toxicities: No musculoskeletal pain or discomfort. Patient takes glucosamine which is helping her symptoms.patient will continue Arimidex until August 2017  Breast cancer surveillance: 1. Mammograms to be done in March 2016 2. Breast exam done 09/03/2014 is normal  Return to clinic once a year for follow-up

## 2014-10-09 ENCOUNTER — Other Ambulatory Visit: Payer: Self-pay | Admitting: *Deleted

## 2014-10-09 DIAGNOSIS — D0592 Unspecified type of carcinoma in situ of left breast: Secondary | ICD-10-CM

## 2014-10-09 MED ORDER — ANASTROZOLE 1 MG PO TABS: 1.0000 mg | ORAL_TABLET | Freq: Every day | ORAL | Status: DC

## 2014-10-11 ENCOUNTER — Ambulatory Visit: Payer: BLUE CROSS/BLUE SHIELD | Admitting: Podiatry

## 2014-10-24 ENCOUNTER — Other Ambulatory Visit: Payer: Self-pay | Admitting: Physician Assistant

## 2014-10-24 ENCOUNTER — Other Ambulatory Visit (HOSPITAL_COMMUNITY)
Admission: RE | Admit: 2014-10-24 | Discharge: 2014-10-24 | Disposition: A | Discharge status: Home / Self Care | Payer: BLUE CROSS/BLUE SHIELD | Source: Ambulatory Visit | Attending: Physician Assistant | Admitting: Physician Assistant

## 2014-10-24 DIAGNOSIS — Z1151 Encounter for screening for human papillomavirus (HPV): Secondary | ICD-10-CM | POA: Diagnosis present

## 2014-10-24 DIAGNOSIS — Z01419 Encounter for gynecological examination (general) (routine) without abnormal findings: Secondary | ICD-10-CM | POA: Insufficient documentation

## 2014-10-24 LAB — HEPATIC FUNCTION PANEL
ALK PHOS: 42 U/L (ref 25–125)
ALK PHOS: 42 (ref 25–125)
ALT: 20 U/L (ref 7–35)
ALT: 20 (ref 7–35)
AST: 16 (ref 13–35)
AST: 16 U/L (ref 13–35)
BILIRUBIN, TOTAL: 0.3 mg/dL
BILIRUBIN, TOTAL: 0.3

## 2014-10-24 LAB — BASIC METABOLIC PANEL
BUN: 18 (ref 4–21)
BUN: 18 mg/dL (ref 4–21)
CREATININE: 0.6 mg/dL (ref <=1.1)
Creatinine: 0.6 (ref 0.5–1.1)
GLUCOSE: 104 mg/dL
GLUCOSE: 104
Potassium: 4.3 (ref 3.4–5.3)
Potassium: 4.3 mmol/L (ref 3.4–5.3)
SODIUM: 138 (ref 137–147)
Sodium: 138 mmol/L (ref 137–147)

## 2014-10-24 LAB — LIPID PANEL
Cholesterol: 177 (ref 0–200)
Cholesterol: 177 mg/dL (ref 0–200)
HDL: 134 — AB (ref 35–70)
HDL: 43 mg/dL (ref 35–70)
LDL CALC: 103
LDL Cholesterol: 103 mg/dL
Triglycerides: 159 (ref 40–160)
Triglycerides: 159 mg/dL (ref 40–160)

## 2014-10-31 LAB — CYTOLOGY - PAP

## 2014-11-12 ENCOUNTER — Ambulatory Visit
Admission: RE | Admit: 2014-11-12 | Discharge: 2014-11-12 | Disposition: A | Discharge status: Home / Self Care | Payer: BLUE CROSS/BLUE SHIELD | Source: Ambulatory Visit | Attending: General Surgery | Admitting: General Surgery

## 2014-11-12 DIAGNOSIS — Z853 Personal history of malignant neoplasm of breast: Secondary | ICD-10-CM

## 2014-11-14 ENCOUNTER — Ambulatory Visit (INDEPENDENT_AMBULATORY_CARE_PROVIDER_SITE_OTHER): Payer: BLUE CROSS/BLUE SHIELD | Admitting: Podiatry

## 2014-11-14 VITALS — BP 118/68 | HR 61 | Resp 16

## 2014-11-14 DIAGNOSIS — M779 Enthesopathy, unspecified: Secondary | ICD-10-CM

## 2014-11-14 DIAGNOSIS — M722 Plantar fascial fibromatosis: Secondary | ICD-10-CM

## 2014-11-14 MED ORDER — TRIAMCINOLONE ACETONIDE 10 MG/ML IJ SUSP
10.0000 mg | Freq: Once | INTRAMUSCULAR | Status: AC
  Administered 2014-11-14: 10 mg

## 2014-11-15 NOTE — Progress Notes (Signed)
Subjective:     Patient ID: Rebecca Robertson, female   DOB: [birth year], 59 y.o.   MRN: [id number]  HPI patient presents stating I'm getting pain on the outside of my heel and I'm not sure if it's been caused by the orthotic or if it is just a painful walking differently. States the inside of the heel is improved but still does get sore at times   Review of Systems     Objective:   Physical Exam Neurovascular status intact with muscle strength adequate and I noted that on the lateral side of the foot around the peroneal base and slightly plantar to this there is inflammation and pain with pressure. She states that this has been present recently and that the medial pain is still they are at times which I did note upon palpation    Assessment:     Lateral tendinitis at the peroneal base with inflammation which may be compensatory in nature and possibility that the orthotics are pushing her to the lateral side    Plan:     I went ahead today and I did lower her orthotic slightly for the right foot. I then injected the peroneal base and into the plantar tissue 3 mg Kenalog 5 mg Xylocaine and advised on ice therapy and reduced activity. Reappoint 2 weeks

## 2015-02-11 ENCOUNTER — Telehealth: Payer: Self-pay

## 2015-02-11 NOTE — Telephone Encounter (Signed)
Patient health advice from [place] rcvd dtd 01/22/15.  Reviewed by Dr. Lindi Adie, Sent to scan.

## 2015-04-06 ENCOUNTER — Other Ambulatory Visit: Payer: Self-pay | Admitting: Hematology and Oncology

## 2015-04-08 ENCOUNTER — Other Ambulatory Visit: Payer: Self-pay | Admitting: *Deleted

## 2015-04-08 DIAGNOSIS — D0592 Unspecified type of carcinoma in situ of left breast: Secondary | ICD-10-CM

## 2015-04-08 MED ORDER — ANASTROZOLE 1 MG PO TABS: 1.0000 mg | ORAL_TABLET | Freq: Every day | ORAL | Status: DC

## 2015-04-25 LAB — HEMOGLOBIN A1C: HEMOGLOBIN A1C: 7.3

## 2015-05-20 ENCOUNTER — Other Ambulatory Visit: Payer: Self-pay | Admitting: Physician Assistant

## 2015-05-20 ENCOUNTER — Ambulatory Visit
Admission: RE | Admit: 2015-05-20 | Discharge: 2015-05-20 | Disposition: A | Discharge status: Home / Self Care | Payer: BLUE CROSS/BLUE SHIELD | Source: Ambulatory Visit | Attending: Physician Assistant | Admitting: Physician Assistant

## 2015-05-20 DIAGNOSIS — R911 Solitary pulmonary nodule: Secondary | ICD-10-CM

## 2015-06-18 LAB — HM DIABETES EYE EXAM

## 2015-07-16 ENCOUNTER — Other Ambulatory Visit: Payer: Self-pay | Admitting: General Surgery

## 2015-07-16 DIAGNOSIS — Z853 Personal history of malignant neoplasm of breast: Secondary | ICD-10-CM

## 2015-08-22 ENCOUNTER — Telehealth: Payer: Self-pay | Admitting: Hematology and Oncology

## 2015-08-22 NOTE — Telephone Encounter (Signed)
Returned patients call to reschedule her  appointment °

## 2015-09-05 ENCOUNTER — Other Ambulatory Visit: Payer: BLUE CROSS/BLUE SHIELD

## 2015-09-05 ENCOUNTER — Ambulatory Visit: Payer: BLUE CROSS/BLUE SHIELD | Admitting: Hematology and Oncology

## 2015-09-09 ENCOUNTER — Other Ambulatory Visit: Payer: Self-pay | Admitting: *Deleted

## 2015-09-09 DIAGNOSIS — D0512 Intraductal carcinoma in situ of left breast: Secondary | ICD-10-CM

## 2015-09-09 NOTE — Assessment & Plan Note (Signed)
Left breast DCIS status post lumpectomy and radiation followed by antiestrogen therapy initially with tamoxifen started in August 2012 later switched to Aromasin and then changed to Arimidex from May 2014.  Arimidex toxicities: No musculoskeletal pain or discomfort. Patient takes glucosamine which is helping her symptoms.patient will continue Arimidex until August 2017  Breast cancer surveillance: 1. Mammograms to be done in March 2016 2. Breast exam done 09/10/2015 is normal  Return to clinic once a year for follow-up

## 2015-09-10 ENCOUNTER — Encounter: Payer: Self-pay | Admitting: Hematology and Oncology

## 2015-09-10 ENCOUNTER — Telehealth: Payer: Self-pay | Admitting: Hematology and Oncology

## 2015-09-10 ENCOUNTER — Other Ambulatory Visit (HOSPITAL_BASED_OUTPATIENT_CLINIC_OR_DEPARTMENT_OTHER): Payer: BLUE CROSS/BLUE SHIELD

## 2015-09-10 ENCOUNTER — Ambulatory Visit (HOSPITAL_BASED_OUTPATIENT_CLINIC_OR_DEPARTMENT_OTHER): Payer: BLUE CROSS/BLUE SHIELD | Admitting: Hematology and Oncology

## 2015-09-10 VITALS — BP 125/73 | HR 61 | Temp 98.4°F | Resp 18 | Ht 64.0 in | Wt 130.8 lb

## 2015-09-10 DIAGNOSIS — D0512 Intraductal carcinoma in situ of left breast: Secondary | ICD-10-CM | POA: Diagnosis not present

## 2015-09-10 LAB — COMPREHENSIVE METABOLIC PANEL
ALT: 21 U/L (ref 0–55)
AST: 19 U/L (ref 5–34)
Albumin: 4.2 g/dL (ref 3.5–5.0)
Alkaline Phosphatase: 42 U/L (ref 40–150)
Anion Gap: 9 mEq/L (ref 3–11)
BUN: 19.1 mg/dL (ref 7.0–26.0)
CO2: 25 mEq/L (ref 22–29)
Calcium: 9.7 mg/dL (ref 8.4–10.4)
Chloride: 108 mEq/L (ref 98–109)
Creatinine: 0.8 mg/dL (ref 0.6–1.1)
EGFR: 85 mL/min/{1.73_m2} — AB (ref >90)
GLUCOSE: 144 mg/dL — AB (ref 70–140)
POTASSIUM: 4 meq/L (ref 3.5–5.1)
SODIUM: 141 meq/L (ref 136–145)
Total Bilirubin: 0.3 mg/dL (ref 0.20–1.20)
Total Protein: 7.6 g/dL (ref 6.4–8.3)

## 2015-09-10 LAB — CBC WITH DIFFERENTIAL/PLATELET
BASO%: 0.8 % (ref 0.0–2.0)
BASOS ABS: 0 10*3/uL (ref 0.0–0.1)
EOS ABS: 0.1 10*3/uL (ref 0.0–0.5)
EOS%: 1.2 % (ref 0.0–7.0)
HCT: 40.9 % (ref 34.8–46.6)
HGB: 13.5 g/dL (ref 11.6–15.9)
LYMPH%: 34.3 % (ref 14.0–49.7)
MCH: 30 pg (ref 25.1–34.0)
MCHC: 33 g/dL (ref 31.5–36.0)
MCV: 90.9 fL (ref 79.5–101.0)
MONO#: 0.5 10*3/uL (ref 0.1–0.9)
MONO%: 7.6 % (ref 0.0–14.0)
NEUT%: 56.1 % (ref 38.4–76.8)
NEUTROS ABS: 3.5 10*3/uL (ref 1.5–6.5)
Platelets: 251 10*3/uL (ref 145–400)
RBC: 4.5 10*6/uL (ref 3.70–5.45)
RDW: 13 % (ref 11.2–14.5)
WBC: 6.2 10*3/uL (ref 3.9–10.3)
lymph#: 2.1 10*3/uL (ref 0.9–3.3)

## 2015-09-10 NOTE — Telephone Encounter (Signed)
patient did stop by but was in a hurry to leave and asked if its ok and she will call at a later time for survivorship

## 2015-09-10 NOTE — Progress Notes (Signed)
Pt reports no problems.   

## 2015-09-10 NOTE — Progress Notes (Signed)
Patient Care Team: Donald Prose, MD as PCP - General (Family Medicine)  DIAGNOSIS: DCIS left breast  CHIEF COMPLIANT: follow-up of DCIS  INTERVAL HISTORY: Rebecca Robertson is a 60 year old with above-mentioned history of left breast DCIS currently on antiestrogen therapy with Arimidex. She is tolerating this much better without major problems or concerns. She denies any lumps or nodules in the breasts.   REVIEW OF SYSTEMS:   Constitutional: Denies fevers, chills or abnormal weight loss Eyes: Denies blurriness of vision Ears, nose, mouth, throat, and face: Denies mucositis or sore throat Respiratory: Denies cough, dyspnea or wheezes Cardiovascular: Denies palpitation, chest discomfort Gastrointestinal:  Denies nausea, heartburn or change in bowel habits Skin: Denies abnormal skin rashes Lymphatics: Denies new lymphadenopathy or easy bruising Neurological:Denies numbness, tingling or new weaknesses Behavioral/Psych: Mood is stable, no new changes  Extremities: No lower extremity edema Breast:  denies any pain or lumps or nodules in either breasts All other systems were reviewed with the patient and are negative.  I have reviewed the past medical history, past surgical history, social history and family history with the patient and they are unchanged from previous note.  ALLERGIES:  is allergic to compazine.  MEDICATIONS:  Current Outpatient Prescriptions  Medication Sig Dispense Refill  . anastrozole (ARIMIDEX) 1 MG tablet Take 1 tablet (1 mg total) by mouth daily. 90 tablet 3  . citalopram (CELEXA) 20 MG tablet   0  . diclofenac (VOLTAREN) 75 MG EC tablet Take 1 tablet (75 mg total) by mouth 2 (two) times daily. 50 tablet 2  . Empagliflozin (JARDIANCE) 10 MG TABS Take by mouth 1 day or 1 dose.    . fenofibrate 160 MG tablet Take 160 mg by mouth daily.     Marland Kitchen glipiZIDE (GLUCOTROL) 10 MG tablet Take 10 mg by mouth daily.     Marland Kitchen lovastatin (MEVACOR) 40 MG tablet   2  . minocycline  (MINOCIN,DYNACIN) 100 MG capsule Take 100 mg by mouth 2 (two) times daily.    Marland Kitchen oxybutynin (DITROPAN) 5 MG tablet Take 5 mg by mouth 2 (two) times daily.    Marland Kitchen SYNJARDY 12.12-998 MG TABS   4  . TANZEUM 50 MG PEN   4   No current facility-administered medications for this visit.    PHYSICAL EXAMINATION: ECOG PERFORMANCE STATUS: 0 - Asymptomatic  Filed Vitals:   09/10/15 0834  BP: 125/73  Pulse: 61  Temp: 98.4 F (36.9 C)  Resp: 18   Filed Weights   09/10/15 0834  Weight: 130 lb 12.8 oz (59.33 kg)    GENERAL:alert, no distress and comfortable SKIN: skin color, texture, turgor are normal, no rashes or significant lesions EYES: normal, Conjunctiva are pink and non-injected, sclera clear OROPHARYNX:no exudate, no erythema and lips, buccal mucosa, and tongue normal  NECK: supple, thyroid normal size, non-tender, without nodularity LYMPH:  no palpable lymphadenopathy in the cervical, axillary or inguinal LUNGS: clear to auscultation and percussion with normal breathing effort HEART: regular rate & rhythm and no murmurs and no lower extremity edema ABDOMEN:abdomen soft, non-tender and normal bowel sounds MUSCULOSKELETAL:no cyanosis of digits and no clubbing  NEURO: alert & oriented x 3 with fluent speech, no focal motor/sensory deficits EXTREMITIES: No lower extremity edema  LABORATORY DATA:  I have reviewed the data as listed   Chemistry      Component Value Date/Time   NA 141 09/10/2015 0755   NA 139 01/31/2013 1322   K 4.0 09/10/2015 0755   K 4.8 01/31/2013 1322  CL 101 01/31/2013 1322   CL 105 12/30/2012 1426   CO2 25 09/10/2015 0755   CO2 29 01/31/2013 1322   BUN 19.1 09/10/2015 0755   BUN 17 05/17/2014 1449   CREATININE 0.8 09/10/2015 0755   CREATININE 0.40* 05/17/2014 1449   CREATININE 0.78 01/31/2013 1322      Component Value Date/Time   CALCIUM 9.7 09/10/2015 0755   CALCIUM 10.3 01/31/2013 1322   ALKPHOS 42 09/10/2015 0755   ALKPHOS 48 01/04/2012 1454    AST 19 09/10/2015 0755   AST 18 01/04/2012 1454   ALT 21 09/10/2015 0755   ALT 19 01/04/2012 1454   BILITOT 0.30 09/10/2015 0755   BILITOT 0.1* 01/04/2012 1454       Lab Results  Component Value Date   WBC 6.2 09/10/2015   HGB 13.5 09/10/2015   HCT 40.9 09/10/2015   MCV 90.9 09/10/2015   PLT 251 09/10/2015   NEUTROABS 3.5 09/10/2015     ASSESSMENT & PLAN:  Neoplasm of left breast, primary tumor staging category Tis: ductal carcinoma in situ (DCIS) Left breast DCIS status post lumpectomy and radiation followed by antiestrogen therapy initially with tamoxifen started in August 2012 later switched to Aromasin and then changed to Arimidex from May 2014.  Arimidex toxicities: No musculoskeletal pain or discomfort. Patient takes glucosamine which is helping her symptoms.patient will continue Arimidex until August 2017  Breast cancer surveillance: 1. Mammograms to be done in March 2016 2. Breast exam done 09/10/2015 is normal  Return to clinic once a year for follow-up with survivorship clinic.  No orders of the defined types were placed in this encounter.   The patient has a good understanding of the overall plan. she agrees with it. she will call with any problems that may develop before the next visit here.   Rulon Eisenmenger, MD 09/10/2015

## 2015-10-16 ENCOUNTER — Telehealth: Payer: Self-pay | Admitting: Hematology and Oncology

## 2015-10-16 NOTE — Telephone Encounter (Signed)
Patient called re annual f/u. Gave patient LTS visit for 09/14/16 per 09/10/15 pof.

## 2015-10-25 ENCOUNTER — Other Ambulatory Visit (HOSPITAL_COMMUNITY)
Admission: RE | Admit: 2015-10-25 | Discharge: 2015-10-25 | Disposition: A | Discharge status: Home / Self Care | Payer: BLUE CROSS/BLUE SHIELD | Source: Ambulatory Visit | Attending: Family Medicine | Admitting: Family Medicine

## 2015-10-25 ENCOUNTER — Other Ambulatory Visit: Payer: Self-pay | Admitting: Physician Assistant

## 2015-10-25 DIAGNOSIS — Z01419 Encounter for gynecological examination (general) (routine) without abnormal findings: Secondary | ICD-10-CM | POA: Insufficient documentation

## 2015-10-25 LAB — HM PAP SMEAR: HM PAP: NEGATIVE

## 2015-10-25 LAB — BASIC METABOLIC PANEL
BUN: 14 mg/dL (ref 4–21)
CREATININE: 0.6 mg/dL (ref <=1.1)
Glucose: 92 mg/dL
Potassium: 4.4 mmol/L (ref 3.4–5.3)
SODIUM: 141 mmol/L (ref 137–147)

## 2015-10-25 LAB — HEPATIC FUNCTION PANEL
ALT: 19 U/L (ref 7–35)
AST: 19 U/L (ref 13–35)
Alkaline Phosphatase: 38 U/L (ref 25–125)
BILIRUBIN, TOTAL: 0.3 mg/dL

## 2015-10-25 LAB — CBC AND DIFFERENTIAL
HEMATOCRIT: 40 % (ref 36–46)
Hemoglobin: 13.3 g/dL (ref 12.0–16.0)
Platelets: 319 10*3/uL (ref 150–399)
WBC: 7.5 10^3/mL

## 2015-10-25 LAB — LIPID PANEL
Cholesterol: 133 mg/dL (ref 0–200)
HDL: 48 mg/dL (ref 35–70)
LDL Cholesterol: 72 mg/dL
TRIGLYCERIDES: 64 mg/dL (ref 40–160)

## 2015-10-29 LAB — CYTOLOGY - PAP

## 2015-11-13 ENCOUNTER — Ambulatory Visit
Admission: RE | Admit: 2015-11-13 | Discharge: 2015-11-13 | Disposition: A | Discharge status: Home / Self Care | Payer: BLUE CROSS/BLUE SHIELD | Source: Ambulatory Visit | Attending: General Surgery | Admitting: General Surgery

## 2015-11-13 DIAGNOSIS — Z853 Personal history of malignant neoplasm of breast: Secondary | ICD-10-CM

## 2015-11-13 LAB — HM MAMMOGRAPHY

## 2015-12-06 LAB — HEMOGLOBIN A1C
HEMOGLOBIN A1C: 6.5
HEMOGLOBIN A1C: 6.5

## 2015-12-06 LAB — VITAMIN B12: Vitamin B-12: 112

## 2015-12-08 LAB — HEMOGLOBIN A1C: Hemoglobin A1C: 6.5

## 2016-01-30 ENCOUNTER — Other Ambulatory Visit: Payer: Self-pay | Admitting: Nurse Practitioner

## 2016-01-30 DIAGNOSIS — R102 Pelvic and perineal pain: Secondary | ICD-10-CM

## 2016-01-31 ENCOUNTER — Ambulatory Visit
Admission: RE | Admit: 2016-01-31 | Discharge: 2016-01-31 | Disposition: A | Discharge status: Home / Self Care | Payer: BLUE CROSS/BLUE SHIELD | Source: Ambulatory Visit | Attending: Nurse Practitioner | Admitting: Nurse Practitioner

## 2016-01-31 DIAGNOSIS — R102 Pelvic and perineal pain: Secondary | ICD-10-CM

## 2016-03-04 ENCOUNTER — Ambulatory Visit (INDEPENDENT_AMBULATORY_CARE_PROVIDER_SITE_OTHER): Payer: BLUE CROSS/BLUE SHIELD | Admitting: Family Medicine

## 2016-03-04 ENCOUNTER — Encounter: Payer: Self-pay | Admitting: Family Medicine

## 2016-03-04 VITALS — BP 121/76 | HR 64 | Ht 64.0 in | Wt 127.0 lb

## 2016-03-04 DIAGNOSIS — E1169 Type 2 diabetes mellitus with other specified complication: Secondary | ICD-10-CM | POA: Insufficient documentation

## 2016-03-04 DIAGNOSIS — L259 Unspecified contact dermatitis, unspecified cause: Secondary | ICD-10-CM | POA: Insufficient documentation

## 2016-03-04 DIAGNOSIS — Z853 Personal history of malignant neoplasm of breast: Secondary | ICD-10-CM

## 2016-03-04 DIAGNOSIS — E559 Vitamin D deficiency, unspecified: Secondary | ICD-10-CM

## 2016-03-04 DIAGNOSIS — E0800 Diabetes mellitus due to underlying condition with hyperosmolarity without nonketotic hyperglycemic-hyperosmolar coma (NKHHC): Secondary | ICD-10-CM

## 2016-03-04 DIAGNOSIS — E785 Hyperlipidemia, unspecified: Secondary | ICD-10-CM | POA: Insufficient documentation

## 2016-03-04 DIAGNOSIS — F419 Anxiety disorder, unspecified: Secondary | ICD-10-CM

## 2016-03-04 HISTORY — DX: Vitamin D deficiency, unspecified: E55.9

## 2016-03-04 MED ORDER — FLUOCINOLONE ACETONIDE 0.01 % OT OIL: TOPICAL_OIL | OTIC | Status: DC

## 2016-03-04 NOTE — Patient Instructions (Signed)
Need med records from your other doctors.

## 2016-03-08 ENCOUNTER — Encounter: Payer: Self-pay | Admitting: Family Medicine

## 2016-03-08 DIAGNOSIS — F419 Anxiety disorder, unspecified: Secondary | ICD-10-CM | POA: Insufficient documentation

## 2016-03-08 NOTE — Progress Notes (Signed)
Marjory Sneddon, D.O. Primary care at [place] and Recommendations:    1. Diabetes mellitus due to underlying condition with hyperosmolarity without coma, without long-term current use of insulin ([place])   2. Hyperlipidemia   3. Contact dermatitis and eczema due to cause   4. History of breast cancer   5. h/o Anxiety     Patient declined to allow me to obtain blood work today. She also declined to make a follow-up for fasting blood work because she said they were recently done at her endocrinologist's office.   I asked her please give me her labs/med records from her other providers.  Risk/benefits of eczema cream discussed with patient. Do not use on sensitive skin regions, will have chance of increased side effect.   Patient's Medications  New Prescriptions   FLUOCINOLONE ACETONIDE 0.01 % OIL    Apply to skin as needed twice daily for rash.  Previous Medications   ANASTROZOLE (ARIMIDEX) 1 MG TABLET    Take 1 tablet (1 mg total) by mouth daily.   BAYER CONTOUR NEXT TEST TEST STRIP    Check blood sugar daily   CITALOPRAM (CELEXA) 20 MG TABLET       FENOFIBRATE 160 MG TABLET    Take 160 mg by mouth daily.    LOVASTATIN (MEVACOR) 40 MG TABLET       OXYBUTYNIN (DITROPAN) 5 MG TABLET    Take 5 mg by mouth 2 (two) times daily.   SYNJARDY 12.12-998 MG TABS       TANZEUM 50 MG PEN      Modified Medications   No medications on file  Discontinued Medications   DICLOFENAC (VOLTAREN) 75 MG EC TABLET    Take 1 tablet (75 mg total) by mouth 2 (two) times daily.   EMPAGLIFLOZIN (JARDIANCE) 10 MG TABS    Take by mouth 1 day or 1 dose.   GLIPIZIDE (GLUCOTROL) 10 MG TABLET    Take 10 mg by mouth daily.    MINOCYCLINE (MINOCIN,DYNACIN) 100 MG CAPSULE    Take 100 mg by mouth 2 (two) times daily.    Return in about 4 weeks (around 04/01/2016) for told pt we need labs and esp if we are going to RF meds for her..  The patient was counseled, risk factors were discussed,  anticipatory guidance given.  Gross side effects, risk and benefits, and alternatives of medications discussed with patient.  Patient is aware that all medications have potential side effects and we are unable to predict every side effect or drug-drug interaction that may occur.  Expresses verbal understanding and consents to current therapy plan and treatment regimen.  Please see AVS handed out to patient at the end of our visit for further patient instructions/ counseling done pertaining to today's office visit.    Note: This document was prepared using Dragon voice recognition software and may include unintentional dictation errors.  _______________________________________________________________________ ------------------------------------------------------------------------------------------------------------------    Subjective:    Chief Complaint  Patient presents with  . Establish Care   New patient visit.  HPI: Myca Staloch is a pleasant 60 y.o. female who presents to [place] at [facility] today to review their medical history with me and establish care.    Urinary incontinence: Sees urologist Dr. Nicki Reaper MacDiarmid.  Diabetes mellitus:  approximately 12-15 years. Sees Dr. Louanna [facility] for this.  Patient does not know what her last A1c was.  She does not check  her sugars regularly.  She is not having any sx.  I asked her to release her medical records to me   Has been on Celexa for depression and < anxiety since 2012 when she was diagnosed with breast cancer.   She had left breast ductal carcinoma in situ, had lumpectomy and radiation, currently on antiestrogen therapy with arimidex. Sees Dr Nicholas Lose  Hyperlipidemia: On statin, denies s-e, last labs- unknwn to pt   Eczema:  Needs refill of her cream.  Patient is happily married, this is her second husband.  She is a retired Lobbyist.  4 kids one son and 3 daughters  and has one grandson.  She has no other acute concerns or issues to discuss today. She tells me today that she just needs a family doctor so she has somewhere to go if she gets sick.   She told me that her specialists treat all of her other conditions.   Patient Active Problem List   Diagnosis Date Noted  . h/o Anxiety 03/08/2016  . Hyperlipidemia 03/04/2016  . Vitamin D insufficiency 03/04/2016  . History of breast cancer 03/04/2016  . Contact dermatitis and eczema due to cause 03/04/2016  . Closed fracture of proximal phalanx of toe 04/03/2014  . Diabetes ([place]) 03/27/2014  . Neoplasm of left breast, primary tumor staging category Tis: ductal carcinoma in situ (DCIS) 02/10/2011     Past Medical History:  Diagnosis Date  . Anxiety   . Diabetes mellitus   . History of breast cancer ONCOLOGIST-- DR Humphrey Rolls--  NO RECURRENCE   S/P LEFT PARTIAL MASTECTOMY W/ SLN BX'S  12-02-2010---  DUCTAL CARCINOMA IN SITU--  S/P RADIATION THERAPY ENDED 02-17-2011  . Hyperlipidemia   . PONV (postoperative nausea and vomiting)   . SUI (stress urinary incontinence, female)   . Vitamin D deficiency   . Vitamin D insufficiency 03/04/2016     Past Surgical History:  Procedure Laterality Date  . BLADDER SUSPENSION N/A 02/03/2013   Procedure: [facility] PROCEDURE;  Surgeon: Reece Packer, MD;  Location: [facility];  Service: Urology;  Laterality: N/A;  . BUNIONECTOMY    . CESAREAN SECTION     X4  . PARTIAL MASTECTOMY WITH AXILLARY SENTINEL LYMPH NODE BIOPSY Left 12-02-2010     Family History  Problem Relation Age of Onset  . Cancer Mother     lung  . Heart attack Father   . COPD Father   . Heart disease Father      History  Drug Use No    History  Alcohol Use No    History  Smoking Status  . Never Smoker  Smokeless Tobacco  . Never Used    Patient's Medications  New Prescriptions   FLUOCINOLONE ACETONIDE 0.01 % OIL    Apply to skin as needed twice daily for rash.    Previous Medications   ANASTROZOLE (ARIMIDEX) 1 MG TABLET    Take 1 tablet (1 mg total) by mouth daily.   BAYER CONTOUR NEXT TEST TEST STRIP    Check blood sugar daily   CITALOPRAM (CELEXA) 20 MG TABLET       FENOFIBRATE 160 MG TABLET    Take 160 mg by mouth daily.    LOVASTATIN (MEVACOR) 40 MG TABLET       OXYBUTYNIN (DITROPAN) 5 MG TABLET    Take 5 mg by mouth 2 (two) times daily.   SYNJARDY 12.12-998 MG TABS       TANZEUM 50  MG PEN      Modified Medications   No medications on file  Discontinued Medications   DICLOFENAC (VOLTAREN) 75 MG EC TABLET    Take 1 tablet (75 mg total) by mouth 2 (two) times daily.   EMPAGLIFLOZIN (JARDIANCE) 10 MG TABS    Take by mouth 1 day or 1 dose.   GLIPIZIDE (GLUCOTROL) 10 MG TABLET    Take 10 mg by mouth daily.    MINOCYCLINE (MINOCIN,DYNACIN) 100 MG CAPSULE    Take 100 mg by mouth 2 (two) times daily.    Compazine  Review of Systems:   ( Completed via Adult Medical History Intake form today ) General:  Denies fever, chills, appetite changes, unexplained weight loss.  Optho/Auditory:   Denies visual changes, blurred vision/LOV, ringing in ears/ diff hearing Respiratory:   Denies SOB, DOE, cough, wheezing.  Cardiovascular:   Denies chest pain, palpitations, new onset peripheral edema  Gastrointestinal:   Denies nausea, vomiting, diarrhea.  Genitourinary:    Denies dysuria, increased frequency, flank pain.  Endocrine:     Denies hot or cold intolerance, polyuria, polydipsia. Musculoskeletal:  Denies unexplained myalgias, joint swelling, arthralgias, gait problems.  Skin:  Denies rash, suspicious lesions or new/ changes in moles Neurological:    Denies dizziness, syncope, unexplained weakness, lightheadedness, numbness  Psychiatric/Behavioral:   Denies mood changes, suicidal or homicidal ideations, hallucinations    Objective:   Blood pressure 104/56, pulse 64, height 5\' 4"  (1.626 m), weight 127 lb (57.6 kg), SpO2 100 %.  Body mass index is  21.8 kg/m.  General: Well Developed, well nourished, and in no acute distress.  Neuro: Alert and oriented x3, extra-ocular muscles intact, sensation grossly intact.  HEENT: Normocephalic, atraumatic, pupils equal round reactive to light, neck supple, no gross masses, no carotid bruits, no JVD apprec Skin: no gross suspicious lesions or rashes  Cardiac: Regular rate and rhythm, no murmurs rubs or gallops.  Respiratory: Essentially clear to auscultation bilaterally. Not using accessory muscles, speaking in full sentences.  Abdominal: Soft, not grossly distended Musculoskeletal: Ambulates w/o diff, FROM * 4 ext.  Vasc: less 2 sec cap RF, warm and pink  Psych:  No HI/SI, judgement and insight good.

## 2016-03-25 ENCOUNTER — Other Ambulatory Visit: Payer: Self-pay | Admitting: Hematology and Oncology

## 2016-03-25 DIAGNOSIS — N952 Postmenopausal atrophic vaginitis: Secondary | ICD-10-CM | POA: Insufficient documentation

## 2016-03-25 DIAGNOSIS — D0592 Unspecified type of carcinoma in situ of left breast: Secondary | ICD-10-CM

## 2016-03-26 ENCOUNTER — Encounter: Payer: Self-pay | Admitting: Family Medicine

## 2016-03-30 MED ORDER — ANASTROZOLE 1 MG PO TABS: 1.0000 mg | ORAL_TABLET | Freq: Every day | ORAL | 3 refills | Status: DC

## 2016-03-30 NOTE — Telephone Encounter (Signed)
lmovm - refill sent in.

## 2016-03-30 NOTE — Addendum Note (Signed)
Addended by: Prentiss Bells on: 03/30/2016 03:34 PM   Modules accepted: Orders

## 2016-05-04 ENCOUNTER — Ambulatory Visit (INDEPENDENT_AMBULATORY_CARE_PROVIDER_SITE_OTHER): Payer: BLUE CROSS/BLUE SHIELD | Admitting: Family Medicine

## 2016-05-04 VITALS — BP 108/61 | HR 67 | Wt 128.0 lb

## 2016-05-04 DIAGNOSIS — Z111 Encounter for screening for respiratory tuberculosis: Secondary | ICD-10-CM | POA: Diagnosis not present

## 2016-05-04 NOTE — Progress Notes (Signed)
   Subjective:    Patient ID: Rebecca Robertson, female    DOB: 1956-02-02, 60 y.o.   MRN: [id number]   Here for PPD placement.    Rebecca Robertson is here for a PPD placement.   Review of Systems  Constitutional: Negative for chills, diaphoresis, fever and unexpected weight change.  Respiratory: Negative for cough and shortness of breath.        Objective:   Physical Exam  Constitutional: She is oriented to person, place, and time. She appears well-developed and well-nourished.  HENT:  Head: Normocephalic and atraumatic.  Eyes: Pupils are equal, round, and reactive to light.  Musculoskeletal: Normal range of motion.  Neurological: She is alert and oriented to person, place, and time.  Skin: Skin is warm and dry.  Psychiatric: She has a normal mood and affect.          Assessment & Plan:  PPD placement- patient tolerated injection well without complications. Advised to return after this time on Wednesday or before this time on Thursday for PPD read.

## 2016-05-07 ENCOUNTER — Ambulatory Visit (INDEPENDENT_AMBULATORY_CARE_PROVIDER_SITE_OTHER): Payer: BLUE CROSS/BLUE SHIELD

## 2016-05-07 DIAGNOSIS — Z111 Encounter for screening for respiratory tuberculosis: Secondary | ICD-10-CM

## 2016-05-07 LAB — TB SKIN TEST
INDURATION: 0 mm
TB SKIN TEST: NEGATIVE

## 2016-05-07 NOTE — Progress Notes (Signed)
TB reading.  14mm induration- negative reading.  Charyl Bigger, CMA

## 2016-05-12 NOTE — Progress Notes (Signed)
Pt aware of results 

## 2016-05-28 ENCOUNTER — Ambulatory Visit (INDEPENDENT_AMBULATORY_CARE_PROVIDER_SITE_OTHER): Payer: BLUE CROSS/BLUE SHIELD | Admitting: Family Medicine

## 2016-05-28 ENCOUNTER — Encounter: Payer: Self-pay | Admitting: Family Medicine

## 2016-05-28 VITALS — BP 107/67 | HR 57 | Ht 64.0 in | Wt 127.9 lb

## 2016-05-28 DIAGNOSIS — Z853 Personal history of malignant neoplasm of breast: Secondary | ICD-10-CM | POA: Diagnosis not present

## 2016-05-28 DIAGNOSIS — E781 Pure hyperglyceridemia: Secondary | ICD-10-CM | POA: Insufficient documentation

## 2016-05-28 DIAGNOSIS — Z0000 Encounter for general adult medical examination without abnormal findings: Secondary | ICD-10-CM

## 2016-05-28 DIAGNOSIS — F32A Depression, unspecified: Secondary | ICD-10-CM | POA: Insufficient documentation

## 2016-05-28 DIAGNOSIS — F3289 Other specified depressive episodes: Secondary | ICD-10-CM

## 2016-05-28 DIAGNOSIS — N393 Stress incontinence (female) (male): Secondary | ICD-10-CM | POA: Insufficient documentation

## 2016-05-28 DIAGNOSIS — E538 Deficiency of other specified B group vitamins: Secondary | ICD-10-CM

## 2016-05-28 DIAGNOSIS — Z7189 Other specified counseling: Secondary | ICD-10-CM

## 2016-05-28 DIAGNOSIS — F329 Major depressive disorder, single episode, unspecified: Secondary | ICD-10-CM | POA: Insufficient documentation

## 2016-05-28 DIAGNOSIS — E559 Vitamin D deficiency, unspecified: Secondary | ICD-10-CM

## 2016-05-28 NOTE — Progress Notes (Signed)
Impression and Recommendations:    1. Encounter for routine history and physical exam in female   2. History of breast cancer   3. Other depression   4. B12 deficiency   5. Vitamin D insufficiency   6. Counseling on health promotion and disease prevention     Please see orders section below for further details of actions taken during this office visit.  Gross side effects, risk and benefits, and alternatives of medications discussed with patient.  Patient is aware that all medications have potential side effects and we are unable to predict every side effect or drug-drug interaction that may occur.  Expresses verbal understanding and consents to current therapy plan and treatment regiment.  1) Anticipatory Guidance: Discussed importance of wearing a seatbelt while driving, not texting while driving; sunscreen when outside along with yearly skin surveillance; eating a well balanced and modest diet; physical activity at least 25 minutes per day or 150 min/ week of moderate to intense activity.  2) Immunizations / Screenings / Labs:  All immunizations and screenings that patient agrees to, are up-to-date per recommendations or will be updated today.  Patient understands the needs for q 78mo dental and yearly vision screens which pt will schedule independently. Obtain CBC, CMP, HgA1c, Lipid panel, TSH and vit D when fasting if not already done recently.   3) Weight:   Improve nutrient density of diet through increasing intake of fruits and vegetables and decreasing saturated/trans fats, white flour products and refined sugar products.   F-up preventative CPE in 1 year. F/up sooner for chronic care management as discussed and/or prn.  Please see orders placed and AVS handed out to patient at the end of our visit for further patient instructions/ counseling done pertaining to today's office visit.     Subjective:    Chief Complaint  Patient presents with  . Annual Exam   HPI: Miguel Marcos is a 60 y.o. female who presents to [facility] today a yearly health maintenance exam.  Health Maintenance Summary Reviewed and updated, unless pt declines services.  Alcohol:        No concerns, no excessive use Exercise Habits:     rare STD concerns:     none Drug Use:     None Birth control method:    none Menses regular:      yes Lumps or breast concerns:      no Breast Cancer Family History:      no -  Mammogram done 3 \\17  - UTD colonoscopy, pap, breast, foot etc   FROM MY OV ON 03/04/16:  "Urinary incontinence: Sees urologist Dr. Nicki Reaper MacDiarmid.  Diabetes mellitus:  approximately 12-15 years. Sees Dr. [facility]anna [facility] for this.  Patient does not know what her last A1c was.  She does not check her sugars regularly.  She is not having any sx.  I asked her to release her medical records to me   Has been on Celexa for depression and < anxiety since 2012 when she was diagnosed with breast cancer.   She had left breast ductal carcinoma in situ, had lumpectomy and radiation, currently on antiestrogen therapy with arimidex. Sees Dr Nicholas Lose  Hyperlipidemia: On statin, denies s-e, last labs- unknwn to pt   Eczema:  Needs refill of her cream.  Patient is happily married, this is her second husband.  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  She has no other acute concerns or issues to discuss today. She tells me today that she just needs a family doctor so she has somewhere to go if she gets sick.   She told me that her specialists treat all of her other conditions"   Wt Readings from Last 3 Encounters:  05/28/16 127 lb 14.4 oz (58 kg)  05/04/16 128 lb (58.1 kg)  03/04/16 127 lb (57.6 kg)   BP Readings from Last 3 Encounters:  05/28/16 107/67  05/04/16 108/61  03/04/16 121/76   Pulse Readings from Last 3 Encounters:  05/28/16 (!) 57  05/04/16 67  03/04/16 64      Past Medical History:  Diagnosis Date  . Anxiety   . Diabetes mellitus   . History of breast cancer ONCOLOGIST-- DR Humphrey Rolls--  NO RECURRENCE   S/P LEFT PARTIAL MASTECTOMY W/ SLN BX'S  12-02-2010---  DUCTAL CARCINOMA IN SITU--  S/P RADIATION THERAPY ENDED 02-17-2011  . Hyperlipidemia   . PONV (postoperative nausea and vomiting)   . SUI (stress urinary incontinence, female)   . Vitamin D deficiency   . Vitamin D insufficiency 03/04/2016      Past Surgical History:  Procedure Laterality Date  . BLADDER SUSPENSION N/A 02/03/2013   Procedure: [facility] PROCEDURE;  Surgeon: Reece Packer, MD;  Location: [facility];  Service: Urology;  Laterality: N/A;  . BUNIONECTOMY    . CESAREAN SECTION     X4  . PARTIAL MASTECTOMY WITH AXILLARY SENTINEL LYMPH NODE BIOPSY Left 12-02-2010      Family History  Problem Relation Age of Onset  . Cancer Mother     lung  . Heart attack Father   . COPD Father   . Heart disease Father       History  Drug Use No  ,   History  Alcohol Use No  ,   History  Smoking Status  . Never Smoker  Smokeless Tobacco  . Never Used  ,   History  Sexual Activity  . Sexual activity: Yes    Current Outpatient Prescriptions on File Prior to Visit  Medication Sig Dispense Refill  . anastrozole (ARIMIDEX) 1 MG tablet Take 1 tablet (1 mg total) by mouth daily. 90 tablet 3  . BAYER CONTOUR NEXT TEST test strip Check blood sugar daily  5  . citalopram (CELEXA) 20 MG tablet   0  . fenofibrate 160 MG tablet Take 160 mg by mouth daily.     . Fluocinolone Acetonide 0.01 % OIL Apply to skin as needed twice daily for rash. 20 mL 6  . lovastatin (MEVACOR) 40 MG tablet   2  . oxybutynin (DITROPAN) 5 MG tablet Take 5 mg by mouth 2 (two) times daily.    Marland Kitchen SYNJARDY 12.12-998 MG TABS   4  . TANZEUM 50 MG PEN   4   No current facility-administered medications on file prior to visit.     Allergies: Compazine  Review of Systems    Constitutional: Negative.  Negative for chills, diaphoresis, fever, malaise/fatigue and weight loss.  HENT: Negative.  Negative for congestion, sore throat and tinnitus.   Eyes: Negative.  Negative for blurred vision, double vision and photophobia.  Respiratory: Negative.  Negative for cough and wheezing.   Cardiovascular: Negative.  Negative for chest pain and palpitations.  Gastrointestinal: Negative.  Negative for blood in stool, diarrhea, nausea and vomiting.  Genitourinary: Negative.  Negative for dysuria, frequency and urgency.  Musculoskeletal: Negative.  Negative for joint pain and myalgias.  Skin: Negative.  Negative for itching and rash.  Neurological: Negative.  Negative for dizziness, focal weakness, weakness and headaches.  Endo/Heme/Allergies: Negative.  Negative for environmental allergies and polydipsia. Does not bruise/bleed easily.  Psychiatric/Behavioral: Negative.  Negative for depression and memory loss. The patient is not nervous/anxious and does not have insomnia.      Objective:    Blood pressure 107/67, pulse (!) 57, height 5\' 4"  (1.626 m), weight 127 lb 14.4 oz (58 kg). Body mass index is 21.95 kg/m. General Appearance:    Alert, cooperative, no distress, appears stated age  Head:    Normocephalic, without obvious abnormality, atraumatic  Eyes:    PERRL, conjunctiva/corneas clear, EOM's intact, fundi    benign, both eyes  Ears:    Normal TM's and external ear canals, both ears  Nose:   Nares normal, septum midline, mucosa normal, no drainage    or sinus tenderness  Throat:   Lips w/o lesion, mucosa moist, and tongue normal; teeth and   gums normal  Neck:   Supple, symmetrical, trachea midline, no adenopathy;    thyroid:  no enlargement/tenderness/nodules; no carotid   bruit or JVD  Back:     Symmetric, no curvature, ROM normal, no CVA tenderness  Lungs:     Clear to auscultation bilaterally, respirations unlabored, no       Wh/ R/ R  Chest Wall:    No  tenderness or gross deformity; normal excursion   Heart:    Regular rate and rhythm, S1 and S2 normal, no murmur, rub   or gallop  Breast Exam:    deferred  Abdomen:     Soft, non-tender, bowel sounds active all four quadrants, NO   G/R/R, no masses, no organomegaly  Genitalia:    deferred  Rectal:    deferred  Extremities:   Extremities normal, atraumatic, no cyanosis or gross edema  Pulses:   2+ and symmetric all extremities  Skin:   Warm, dry, Skin color, texture, turgor normal, no obvious rashes or lesions Psych: No HI/SI, judgement and insight good, Euthymic mood. Full Affect.  Neurologic:   CNII-XII intact, normal strength, sensation and reflexes    Throughout

## 2016-05-28 NOTE — Patient Instructions (Addendum)
Preventive Care for Adults, Female A healthy lifestyle and preventive care can promote health and wellness. Preventive health guidelines for women include the following key practices.   A routine yearly physical is a good way to check with your health care provider about your health and preventive screening. It is a chance to share any concerns and updates on your health and to receive a thorough exam.   Visit your dentist for a routine exam and preventive care every 6 months. Brush your teeth twice a day and floss once a day. Good oral hygiene prevents tooth decay and gum disease.   The frequency of eye exams is based on your age, health, family medical history, use of contact lenses, and other factors. Follow your health care provider's recommendations for frequency of eye exams.   Eat a healthy diet. Foods like vegetables, fruits, whole grains, low-fat dairy products, and lean protein foods contain the nutrients you need without too many calories. Decrease your intake of foods high in solid fats, added sugars, and salt. Eat the right amount of calories for you.Get information about a proper diet from your health care provider, if necessary.   Regular physical exercise is one of the most important things you can do for your health. Most adults should get at least 150 minutes of moderate-intensity exercise (any activity that increases your heart rate and causes you to sweat) each week. In addition, most adults need muscle-strengthening exercises on 2 or more days a week.   Maintain a healthy weight. The body mass index (BMI) is a screening tool to identify possible weight problems. It provides an estimate of body fat based on height and weight. Your health care provider can find your BMI, and can help you achieve or maintain a healthy weight.For adults 20 years and older:   - A BMI below 18.5 is considered underweight.   - A BMI of 18.5 to 24.9 is normal.   - A BMI of 25 to 29.9 is  considered overweight.   - A BMI of 30 and above is considered obese.   Maintain normal blood lipids and cholesterol levels by exercising and minimizing your intake of trans and saturated fats.  Eat a balanced diet with plenty of fruit and vegetables. Blood tests for lipids and cholesterol should begin at age 85 and be repeated every 5 years minimum.  If your lipid or cholesterol levels are high, you are over 40, or you are at high risk for heart disease, you may need your cholesterol levels checked more frequently.Ongoing high lipid and cholesterol levels should be treated with medicines if diet and exercise are not working.   If you smoke, find out from your health care provider how to quit. If you do not use tobacco, do not start.   Lung cancer screening is recommended for adults aged 69-80 years who are at high risk for developing lung cancer because of a history of smoking. A yearly low-dose CT scan of the lungs is recommended for people who have at least a 30-pack-year history of smoking and are a current smoker or have quit within the past 15 years. A pack year of smoking is smoking an average of 1 pack of cigarettes a day for 1 year (for example: 1 pack a day for 30 years or 2 packs a day for 15 years). Yearly screening should continue until the smoker has stopped smoking for at least 15 years. Yearly screening should be stopped for people who develop a health  problem that would prevent them from having lung cancer treatment.   If you are pregnant, do not drink alcohol. If you are breastfeeding, be very cautious about drinking alcohol. If you are not pregnant and choose to drink alcohol, do not have more than 1 drink per day. One drink is considered to be 12 ounces (355 mL) of beer, 5 ounces (148 mL) of wine, or 1.5 ounces (44 mL) of liquor.   Avoid use of street drugs. Do not share needles with anyone. Ask for help if you need support or instructions about stopping the use of  drugs.   High blood pressure causes heart disease and increases the risk of stroke. Your blood pressure should be checked at least yearly.  Ongoing high blood pressure should be treated with medicines if weight loss and exercise do not work.   If you are 62-70 years old, ask your health care provider if you should take aspirin to prevent strokes.   Diabetes screening involves taking a blood sample to check your fasting blood sugar level. This should be done once every 3 years, after age 58, if you are within normal weight and without risk factors for diabetes. Testing should be considered at a younger age or be carried out more frequently if you are overweight and have at least 1 risk factor for diabetes.   Breast cancer screening is essential preventive care for women. You should practice "breast self-awareness."  This means understanding the normal appearance and feel of your breasts and may include breast self-examination.  Any changes detected, no matter how small, should be reported to a health care provider.  Women in their 64s and 30s should have a clinical breast exam (CBE) by a health care provider as part of a regular health exam every 1 to 3 years.  After age 54, women should have a CBE every year.  Starting at age 59, women should consider having a mammogram (breast X-ray test) every year.  Women who have a family history of breast cancer should talk to their health care provider about genetic screening.  Women at a high risk of breast cancer should talk to their health care providers about having an MRI and a mammogram every year.   -Breast cancer gene (BRCA)-related cancer risk assessment is recommended for women who have family members with BRCA-related cancers. BRCA-related cancers include breast, ovarian, tubal, and peritoneal cancers. Having family members with these cancers may be associated with an increased risk for harmful changes (mutations) in the breast cancer genes BRCA1 and  BRCA2. Results of the assessment will determine the need for genetic counseling and BRCA1 and BRCA2 testing.   The Pap test is a screening test for cervical cancer. A Pap test can show cell changes on the cervix that might become cervical cancer if left untreated. A Pap test is a procedure in which cells are obtained and examined from the lower end of the uterus (cervix).   - Women should have a Pap test starting at age 5.   - Between ages 61 and 15, Pap tests should be repeated every 2 years.   - Beginning at age 19, you should have a Pap test every 3 years as long as the past 3 Pap tests have been normal.   - Some women have medical problems that increase the chance of getting cervical cancer. Talk to your health care provider about these problems. It is especially important to talk to your health care provider if a new  problem develops soon after your last Pap test. In these cases, your health care provider may recommend more frequent screening and Pap tests.   - The above recommendations are the same for women who have or have not gotten the vaccine for human papillomavirus (HPV).   - If you had a hysterectomy for a problem that was not cancer or a condition that could lead to cancer, then you no longer need Pap tests. Even if you no longer need a Pap test, a regular exam is a good idea to make sure no other problems are starting.   - If you are between ages 60 and 39 years, and you have had normal Pap tests going back 10 years, you no longer need Pap tests. Even if you no longer need a Pap test, a regular exam is a good idea to make sure no other problems are starting.   - If you have had past treatment for cervical cancer or a condition that could lead to cancer, you need Pap tests and screening for cancer for at least 20 years after your treatment.   - If Pap tests have been discontinued, risk factors (such as a new sexual partner) need to be reassessed to determine if screening should  be resumed.   - The HPV test is an additional test that may be used for cervical cancer screening. The HPV test looks for the virus that can cause the cell changes on the cervix. The cells collected during the Pap test can be tested for HPV. The HPV test could be used to screen women aged 73 years and older, and should be used in women of any age who have unclear Pap test results. After the age of 70, women should have HPV testing at the same frequency as a Pap test.   Colorectal cancer can be detected and often prevented. Most routine colorectal cancer screening begins at the age of 63 years and continues through age 92 years. However, your health care provider may recommend screening at an earlier age if you have risk factors for colon cancer. On a yearly basis, your health care provider may provide home test kits to check for hidden blood in the stool.  Use of a small camera at the end of a tube, to directly examine the colon (sigmoidoscopy or colonoscopy), can detect the earliest forms of colorectal cancer. Talk to your health care provider about this at age 49, when routine screening begins. Direct exam of the colon should be repeated every 5 -10 years through age 54 years, unless early forms of pre-cancerous polyps or small growths are found.   People who are at an increased risk for hepatitis B should be screened for this virus. You are considered at high risk for hepatitis B if:  -You were born in a country where hepatitis B occurs often. Talk with your health care provider about which countries are considered high risk.  - Your parents were born in a high-risk country and you have not received a shot to protect against hepatitis B (hepatitis B vaccine).  - You have HIV or AIDS.  - You use needles to inject street drugs.  - You live with, or have sex with, someone who has Hepatitis B.  - You get hemodialysis treatment.  - You take certain medicines for conditions like cancer, organ  transplantation, and autoimmune conditions.   Hepatitis C blood testing is recommended for all people born from [birth year] through 1965 and any individual with  known risks for hepatitis C.   Practice safe sex. Use condoms and avoid high-risk sexual practices to reduce the spread of sexually transmitted infections (STIs). STIs include gonorrhea, chlamydia, syphilis, trichomonas, herpes, HPV, and human immunodeficiency virus (HIV). Herpes, HIV, and HPV are viral illnesses that have no cure. They can result in disability, cancer, and death. Sexually active women aged 63 years and younger should be checked for chlamydia. Older women with new or multiple partners should also be tested for chlamydia. Testing for other STIs is recommended if you are sexually active and at increased risk.   Osteoporosis is a disease in which the bones lose minerals and strength with aging. This can result in serious bone fractures or breaks. The risk of osteoporosis can be identified using a bone density scan. Women ages 60 years and over and women at risk for fractures or osteoporosis should discuss screening with their health care providers. Ask your health care provider whether you should take a calcium supplement or vitamin D to reduce the rate of osteoporosis.   Menopause can be associated with physical symptoms and risks. Hormone replacement therapy is available to decrease symptoms and risks. You should talk to your health care provider about whether hormone replacement therapy is right for you.   Use sunscreen. Apply sunscreen liberally and repeatedly throughout the day. You should seek shade when your shadow is shorter than you. Protect yourself by wearing long sleeves, pants, a wide-brimmed hat, and sunglasses year round, whenever you are outdoors.   Once a month, do a whole body skin exam, using a mirror to look at the skin on your back. Tell your health care provider of new moles, moles that have irregular borders,  moles that are larger than a pencil eraser, or moles that have changed in shape or color.   Stay current with required vaccines (immunizations).   Influenza vaccine. All adults should be immunized every year.   Tetanus, diphtheria, and acellular pertussis (Td, Tdap) vaccine. Pregnant women should receive 1 dose of Tdap vaccine during each pregnancy. The dose should be obtained regardless of the length of time since the last dose. Immunization is preferred during the 27th 36th week of gestation. An adult who has not previously received Tdap or who does not know her vaccine status should receive 1 dose of Tdap. This initial dose should be followed by tetanus and diphtheria toxoids (Td) booster doses every 10 years. Adults with an unknown or incomplete history of completing a 3-dose immunization series with Td-containing vaccines should begin or complete a primary immunization series including a Tdap dose. Adults should receive a Td booster every 10 years.   Varicella vaccine. An adult without evidence of immunity to varicella should receive 2 doses or a second dose if she has previously received 1 dose. Pregnant females who do not have evidence of immunity should receive the first dose after pregnancy. This first dose should be obtained before leaving the health care facility. The second dose should be obtained 4 8 weeks after the first dose.   Human papillomavirus (HPV) vaccine. Females aged 83 26 years who have not received the vaccine previously should obtain the 3-dose series. The vaccine is not recommended for use in pregnant females. However, pregnancy testing is not needed before receiving a dose. If a female is found to be pregnant after receiving a dose, no treatment is needed. In that case, the remaining doses should be delayed until after the pregnancy. Immunization is recommended for any person with  an immunocompromised condition through the age of 28 years if she did not get any or all  doses earlier. During the 3-dose series, the second dose should be obtained 4 8 weeks after the first dose. The third dose should be obtained 24 weeks after the first dose and 16 weeks after the second dose.   Zoster vaccine. One dose is recommended for adults aged 35 years or older unless certain conditions are present.   Measles, mumps, and rubella (MMR) vaccine. Adults born before 65 generally are considered immune to measles and mumps. Adults born in [birth year] or later should have 1 or more doses of MMR vaccine unless there is a contraindication to the vaccine or there is laboratory evidence of immunity to each of the three diseases. A routine second dose of MMR vaccine should be obtained at least 28 days after the first dose for students attending postsecondary schools, health care workers, or international travelers. People who received inactivated measles vaccine or an unknown type of measles vaccine during 1963 1967 should receive 2 doses of MMR vaccine. People who received inactivated mumps vaccine or an unknown type of mumps vaccine before 1979 and are at high risk for mumps infection should consider immunization with 2 doses of MMR vaccine. For females of childbearing age, rubella immunity should be determined. If there is no evidence of immunity, females who are not pregnant should be vaccinated. If there is no evidence of immunity, females who are pregnant should delay immunization until after pregnancy. Unvaccinated health care workers born before [birth year] who lack laboratory evidence of measles, mumps, or rubella immunity or laboratory confirmation of disease should consider measles and mumps immunization with 2 doses of MMR vaccine or rubella immunization with 1 dose of MMR vaccine.   Pneumococcal 13-valent conjugate (PCV13) vaccine. When indicated, a person who is uncertain of her immunization history and has no record of immunization should receive the PCV13 vaccine. An adult aged 77 years or  older who has certain medical conditions and has not been previously immunized should receive 1 dose of PCV13 vaccine. This PCV13 should be followed with a dose of pneumococcal polysaccharide (PPSV23) vaccine. The PPSV23 vaccine dose should be obtained at least 8 weeks after the dose of PCV13 vaccine. An adult aged 108 years or older who has certain medical conditions and previously received 1 or more doses of PPSV23 vaccine should receive 1 dose of PCV13. The PCV13 vaccine dose should be obtained 1 or more years after the last PPSV23 vaccine dose.   Pneumococcal polysaccharide (PPSV23) vaccine. When PCV13 is also indicated, PCV13 should be obtained first. All adults aged 52 years and older should be immunized. An adult younger than age 19 years who has certain medical conditions should be immunized. Any person who resides in a nursing home or long-term care facility should be immunized. An adult smoker should be immunized. People with an immunocompromised condition and certain other conditions should receive both PCV13 and PPSV23 vaccines. People with human immunodeficiency virus (HIV) infection should be immunized as soon as possible after diagnosis. Immunization during chemotherapy or radiation therapy should be avoided. Routine use of PPSV23 vaccine is not recommended for American Indians, [place]atives, or people younger than 65 years unless there are medical conditions that require PPSV23 vaccine. When indicated, people who have unknown immunization and have no record of immunization should receive PPSV23 vaccine. One-time revaccination 5 years after the first dose of PPSV23 is recommended for people aged 58 64 years who have chronic  kidney failure, nephrotic syndrome, asplenia, or immunocompromised conditions. People who received 1 2 doses of PPSV23 before age 34 years should receive another dose of PPSV23 vaccine at age 28 years or later if at least 5 years have passed since the previous dose. Doses of  PPSV23 are not needed for people immunized with PPSV23 at or after age 48 years.   Meningococcal vaccine. Adults with asplenia or persistent complement component deficiencies should receive 2 doses of quadrivalent meningococcal conjugate (MenACWY-D) vaccine. The doses should be obtained at least 2 months apart. Microbiologists working with certain meningococcal bacteria, [employer] recruits, people at risk during an outbreak, and people who travel to or live in countries with a high rate of meningitis should be immunized. A first-year college student up through age 75 years who is living in a residence hall should receive a dose if she did not receive a dose on or after her 16th birthday. Adults who have certain high-risk conditions should receive one or more doses of vaccine.   Hepatitis A vaccine. Adults who wish to be protected from this disease, have certain high-risk conditions, work with hepatitis A-infected animals, work in hepatitis A research labs, or travel to or work in countries with a high rate of hepatitis A should be immunized. Adults who were previously unvaccinated and who anticipate close contact with an international adoptee during the first 60 days after arrival in the [place] from a country with a high rate of hepatitis A should be immunized.   Hepatitis B vaccine. Adults who wish to be protected from this disease, have certain high-risk conditions, may be exposed to blood or other infectious body fluids, are household contacts or sex partners of hepatitis B positive people, are clients or workers in certain care facilities, or travel to or work in countries with a high rate of hepatitis B should be immunized.   Haemophilus influenzae type b (Hib) vaccine. A previously unvaccinated person with asplenia or sickle cell disease or having a scheduled splenectomy should receive 1 dose of Hib vaccine. Regardless of previous immunization, a recipient of a hematopoietic stem cell  transplant should receive a 3-dose series 6 12 months after her successful transplant. Hib vaccine is not recommended for adults with HIV infection.  Preventive Services / Frequency Ages 35 to 39years  Blood pressure check.** / Every 1 to 2 years.  Lipid and cholesterol check.** / Every 5 years beginning at age 28.  Clinical breast exam.** / Every 3 years for women in their 31s and 7s.  BRCA-related cancer risk assessment.** / For women who have family members with a BRCA-related cancer (breast, ovarian, tubal, or peritoneal cancers).  Pap test.** / Every 2 years from ages 42 through 56. Every 3 years starting at age 68 through age 20 or 27 with a history of 3 consecutive normal Pap tests.  HPV screening.** / Every 3 years from ages 81 through ages 42 to 39 with a history of 3 consecutive normal Pap tests.  Hepatitis C blood test.** / For any individual with known risks for hepatitis C.  Skin self-exam. / Monthly.  Influenza vaccine. / Every year.  Tetanus, diphtheria, and acellular pertussis (Tdap, Td) vaccine.** / Consult your health care provider. Pregnant women should receive 1 dose of Tdap vaccine during each pregnancy. 1 dose of Td every 10 years.  Varicella vaccine.** / Consult your health care provider. Pregnant females who do not have evidence of immunity should receive the first dose after pregnancy.  HPV vaccine. /  3 doses over 6 months, if 26 and younger. The vaccine is not recommended for use in pregnant females. However, pregnancy testing is not needed before receiving a dose.  Measles, mumps, rubella (MMR) vaccine.** / You need at least 1 dose of MMR if you were born in 1957 or later. You may also need a 2nd dose. For females of childbearing age, rubella immunity should be determined. If there is no evidence of immunity, females who are not pregnant should be vaccinated. If there is no evidence of immunity, females who are pregnant should delay immunization until after  pregnancy.  Pneumococcal 13-valent conjugate (PCV13) vaccine.** / Consult your health care provider.  Pneumococcal polysaccharide (PPSV23) vaccine.** / 1 to 2 doses if you smoke cigarettes or if you have certain conditions.  Meningococcal vaccine.** / 1 dose if you are age 26 to 27 years and a Market researcher living in a residence hall, or have one of several medical conditions, you need to get vaccinated against meningococcal disease. You may also need additional booster doses.  Hepatitis A vaccine.** / Consult your health care provider.  Hepatitis B vaccine.** / Consult your health care provider.  Haemophilus influenzae type b (Hib) vaccine.** / Consult your health care provider.  Ages 52 to 64years  Blood pressure check.** / Every 1 to 2 years.  Lipid and cholesterol check.** / Every 5 years beginning at age 12 years.  Lung cancer screening. / Every year if you are aged 75 80 years and have a 30-pack-year history of smoking and currently smoke or have quit within the past 15 years. Yearly screening is stopped once you have quit smoking for at least 15 years or develop a health problem that would prevent you from having lung cancer treatment.  Clinical breast exam.** / Every year after age 42 years.  BRCA-related cancer risk assessment.** / For women who have family members with a BRCA-related cancer (breast, ovarian, tubal, or peritoneal cancers).  Mammogram.** / Every year beginning at age 27 years and continuing for as long as you are in good health. Consult with your health care provider.  Pap test.** / Every 3 years starting at age 32 years through age 80 or 10 years with a history of 3 consecutive normal Pap tests.  HPV screening.** / Every 3 years from ages 72 years through ages 66 to 85 years with a history of 3 consecutive normal Pap tests.  Fecal occult blood test (FOBT) of stool. / Every year beginning at age 3 years and continuing until age 100 years. You may  not need to do this test if you get a colonoscopy every 10 years.  Flexible sigmoidoscopy or colonoscopy.** / Every 5 years for a flexible sigmoidoscopy or every 10 years for a colonoscopy beginning at age 67 years and continuing until age 1 years.  Hepatitis C blood test.** / For all people born from [birth year] through 1964 and any individual with known risks for hepatitis C.  Skin self-exam. / Monthly.  Influenza vaccine. / Every year.  Tetanus, diphtheria, and acellular pertussis (Tdap/Td) vaccine.** / Consult your health care provider. Pregnant women should receive 1 dose of Tdap vaccine during each pregnancy. 1 dose of Td every 10 years.  Varicella vaccine.** / Consult your health care provider. Pregnant females who do not have evidence of immunity should receive the first dose after pregnancy.  Zoster vaccine.** / 1 dose for adults aged 69 years or older.  Measles, mumps, rubella (MMR) vaccine.** / You need at  least 1 dose of MMR if you were born in 1956 or later. You may also need a 2nd dose. For females of childbearing age, rubella immunity should be determined. If there is no evidence of immunity, females who are not pregnant should be vaccinated. If there is no evidence of immunity, females who are pregnant should delay immunization until after pregnancy.  Pneumococcal 13-valent conjugate (PCV13) vaccine.** / Consult your health care provider.  Pneumococcal polysaccharide (PPSV23) vaccine.** / 1 to 2 doses if you smoke cigarettes or if you have certain conditions.  Meningococcal vaccine.** / Consult your health care provider.  Hepatitis A vaccine.** / Consult your health care provider.  Hepatitis B vaccine.** / Consult your health care provider.  Haemophilus influenzae type b (Hib) vaccine.** / Consult your health care provider.  Ages 79 years and over  Blood pressure check.** / Every 1 to 2 years.  Lipid and cholesterol check.** / Every 5 years beginning at age 38  years.  Lung cancer screening. / Every year if you are aged 38 80 years and have a 30-pack-year history of smoking and currently smoke or have quit within the past 15 years. Yearly screening is stopped once you have quit smoking for at least 15 years or develop a health problem that would prevent you from having lung cancer treatment.  Clinical breast exam.** / Every year after age 39 years.  BRCA-related cancer risk assessment.** / For women who have family members with a BRCA-related cancer (breast, ovarian, tubal, or peritoneal cancers).  Mammogram.** / Every year beginning at age 26 years and continuing for as long as you are in good health. Consult with your health care provider.  Pap test.** / Every 3 years starting at age 3 years through age 70 or 79 years with 3 consecutive normal Pap tests. Testing can be stopped between 65 and 70 years with 3 consecutive normal Pap tests and no abnormal Pap or HPV tests in the past 10 years.  HPV screening.** / Every 3 years from ages 44 years through ages 71 or 8 years with a history of 3 consecutive normal Pap tests. Testing can be stopped between 65 and 70 years with 3 consecutive normal Pap tests and no abnormal Pap or HPV tests in the past 10 years.  Fecal occult blood test (FOBT) of stool. / Every year beginning at age 105 years and continuing until age 4 years. You may not need to do this test if you get a colonoscopy every 10 years.  Flexible sigmoidoscopy or colonoscopy.** / Every 5 years for a flexible sigmoidoscopy or every 10 years for a colonoscopy beginning at age 53 years and continuing until age 59 years.  Hepatitis C blood test.** / For all people born from [birth year] through 1964 and any individual with known risks for hepatitis C.  Osteoporosis screening.** / A one-time screening for women ages 67 years and over and women at risk for fractures or osteoporosis.  Skin self-exam. / Monthly.  Influenza vaccine. / Every year.  Tetanus,  diphtheria, and acellular pertussis (Tdap/Td) vaccine.** / 1 dose of Td every 10 years.  Varicella vaccine.** / Consult your health care provider.  Zoster vaccine.** / 1 dose for adults aged 3 years or older.  Pneumococcal 13-valent conjugate (PCV13) vaccine.** / Consult your health care provider.  Pneumococcal polysaccharide (PPSV23) vaccine.** / 1 dose for all adults aged 46 years and older.  Meningococcal vaccine.** / Consult your health care provider.  Hepatitis A vaccine.** / Consult your health  care provider.  Hepatitis B vaccine.** / Consult your health care provider.  Haemophilus influenzae type b (Hib) vaccine.** / Consult your health care provider.     ** Family history and personal history of risk and conditions may change your health care provider's recommendations. Document Released: 09/29/2001 Document Revised: 05/24/2013  [facility] Patient Information 2014 [place], [place].   EXERCISE AND DIET:  We recommended that you start or continue a regular exercise program for good health. Regular exercise means any activity that makes your heart beat faster and makes you sweat.  We recommend exercising at least 30 minutes per day at least 3 days a week, preferably 5.  We also recommend a diet low in fat and sugar / carbohydrates.  Inactivity, poor dietary choices and obesity can cause diabetes, heart attack, stroke, and kidney damage, among others.     ALCOHOL AND SMOKING:  Women should limit their alcohol intake to no more than 7 drinks/beers/glasses of wine (combined, not each!) per week. Moderation of alcohol intake to this level decreases your risk of breast cancer and liver damage.  ( And of course, no recreational drugs are part of a healthy lifestyle.)  Also, you should not be smoking at all or even being exposed to second hand smoke. Most people know smoking can cause cancer, and various heart and lung diseases, but did you know it also contributes to weakening of your  bones?  Aging of your skin?  Yellowing of your teeth and nails?   CALCIUM AND VITAMIN D:  Adequate intake of calcium and Vitamin D are recommended.  The recommendations for exact amounts of these supplements seem to change often, but generally speaking 600 mg of calcium (either carbonate or citrate) and 800 units of Vitamin D per day seems prudent. Certain women may benefit from higher intake of Vitamin D.  If you are among these women, your doctor will have told you during your visit.     PAP SMEARS:  Pap smears, to check for cervical cancer or precancers,  have traditionally been done yearly, although recent scientific advances have shown that most women can have pap smears less often.  However, every woman still should have a physical exam from her gynecologist or primary care physician every year. It will include a breast check, inspection of the vulva and vagina to check for abnormal growths or skin changes, a visual exam of the cervix, and then an exam to evaluate the size and shape of the uterus and ovaries.  And after 59 years of age, a rectal exam is indicated to check for rectal cancers. We will also provide age appropriate advice regarding health maintenance, like when you should have certain vaccines, screening for sexually transmitted diseases, bone density testing, colonoscopy, mammograms, etc.    MAMMOGRAMS:  All women over 19 years old should have a yearly mammogram. Many facilities now offer a "3D" mammogram, which may cost around $50 extra out of pocket. If possible,  we recommend you accept the option to have the 3D mammogram performed.  It both reduces the number of women who will be called back for extra views which then turn out to be normal, and it is better than the routine mammogram at detecting truly abnormal areas.     COLONOSCOPY:  Colonoscopy to screen for colon cancer is recommended for all women at age 39.  We know, you hate the idea of the prep.  We agree, BUT, having  colon cancer and not knowing it is worse!!  Colon cancer so often starts as a polyp that can be seen and removed at colonscopy, which can quite literally save your life!  And if your first colonoscopy is normal and you have no family history of colon cancer, most women don't have to have it again for 10 years.  Once every ten years, you can do something that may end up saving your life, right?  We will be happy to help you get it scheduled when you are ready.  Be sure to check your insurance coverage so you understand how much it will cost.  It may be covered as a preventative service at no cost, but you should check your particular policy.

## 2016-05-29 LAB — LIPID PANEL
CHOL/HDL RATIO: 2.2 ratio (ref <=5.0)
Cholesterol: 123 mg/dL — ABNORMAL LOW (ref 125–200)
HDL: 56 mg/dL (ref >=46)
LDL CALC: 44 mg/dL (ref <130)
TRIGLYCERIDES: 113 mg/dL (ref <150)
VLDL: 23 mg/dL (ref <30)

## 2016-05-29 LAB — CBC WITH DIFFERENTIAL/PLATELET
BASOS ABS: 0 {cells}/uL (ref 0–200)
Basophils Relative: 0 %
EOS PCT: 1 %
Eosinophils Absolute: 73 cells/uL (ref 15–500)
HCT: 41.2 % (ref 35.0–45.0)
Hemoglobin: 13.3 g/dL (ref 11.7–15.5)
Lymphocytes Relative: 39 %
Lymphs Abs: 2847 cells/uL (ref 850–3900)
MCH: 29.2 pg (ref 27.0–33.0)
MCHC: 32.3 g/dL (ref 32.0–36.0)
MCV: 90.5 fL (ref 80.0–100.0)
MONOS PCT: 9 %
MPV: 11.8 fL (ref 7.5–12.5)
Monocytes Absolute: 657 cells/uL (ref 200–950)
NEUTROS PCT: 51 %
Neutro Abs: 3723 cells/uL (ref 1500–7800)
PLATELETS: 322 10*3/uL (ref 140–400)
RBC: 4.55 MIL/uL (ref 3.80–5.10)
RDW: 13.6 % (ref 11.0–15.0)
WBC: 7.3 10*3/uL (ref 3.8–10.8)

## 2016-05-29 LAB — COMPREHENSIVE METABOLIC PANEL
ALBUMIN: 4.3 g/dL (ref 3.6–5.1)
ALT: 15 U/L (ref 6–29)
AST: 17 U/L (ref 10–35)
Alkaline Phosphatase: 36 U/L (ref 33–130)
BUN: 13 mg/dL (ref 7–25)
CHLORIDE: 104 mmol/L (ref 98–110)
CO2: 25 mmol/L (ref 20–31)
CREATININE: 0.52 mg/dL (ref 0.50–1.05)
Calcium: 10 mg/dL (ref 8.6–10.4)
Glucose, Bld: 78 mg/dL (ref 65–99)
Potassium: 4.5 mmol/L (ref 3.5–5.3)
SODIUM: 140 mmol/L (ref 135–146)
Total Bilirubin: 0.5 mg/dL (ref 0.2–1.2)
Total Protein: 7.3 g/dL (ref 6.1–8.1)

## 2016-05-29 LAB — VITAMIN D 25 HYDROXY (VIT D DEFICIENCY, FRACTURES): Vit D, 25-Hydroxy: 27 ng/mL — ABNORMAL LOW (ref 30–100)

## 2016-05-29 LAB — TSH: TSH: 1.98 mIU/L

## 2016-06-08 LAB — HEMOGLOBIN A1C: Hemoglobin A1C: 6.7

## 2016-06-08 LAB — VITAMIN B12: VITAMIN B 12: 112

## 2016-07-20 ENCOUNTER — Other Ambulatory Visit (INDEPENDENT_AMBULATORY_CARE_PROVIDER_SITE_OTHER): Payer: BLUE CROSS/BLUE SHIELD

## 2016-07-20 DIAGNOSIS — Z1211 Encounter for screening for malignant neoplasm of colon: Secondary | ICD-10-CM | POA: Diagnosis not present

## 2016-07-20 LAB — IFOBT (OCCULT BLOOD): IMMUNOLOGICAL FECAL OCCULT BLOOD TEST: NEGATIVE

## 2016-08-18 ENCOUNTER — Ambulatory Visit (INDEPENDENT_AMBULATORY_CARE_PROVIDER_SITE_OTHER): Payer: BLUE CROSS/BLUE SHIELD | Admitting: Family Medicine

## 2016-08-18 ENCOUNTER — Encounter: Payer: Self-pay | Admitting: Family Medicine

## 2016-08-18 ENCOUNTER — Ambulatory Visit: Payer: BLUE CROSS/BLUE SHIELD

## 2016-08-18 VITALS — BP 111/68 | HR 78 | Temp 99.6°F | Ht 64.75 in | Wt 130.3 lb

## 2016-08-18 DIAGNOSIS — R05 Cough: Secondary | ICD-10-CM | POA: Diagnosis not present

## 2016-08-18 DIAGNOSIS — J018 Other acute sinusitis: Secondary | ICD-10-CM

## 2016-08-18 DIAGNOSIS — E0800 Diabetes mellitus due to underlying condition with hyperosmolarity without nonketotic hyperglycemic-hyperosmolar coma (NKHHC): Secondary | ICD-10-CM

## 2016-08-18 DIAGNOSIS — R059 Cough, unspecified: Secondary | ICD-10-CM

## 2016-08-18 DIAGNOSIS — R0782 Intercostal pain: Secondary | ICD-10-CM | POA: Diagnosis not present

## 2016-08-18 MED ORDER — PREDNISONE 50 MG PO TABS: 50.0000 mg | ORAL_TABLET | Freq: Every day | ORAL | 0 refills | Status: DC

## 2016-08-18 MED ORDER — AMOXICILLIN-POT CLAVULANATE 875-125 MG PO TABS: 1.0000 | ORAL_TABLET | Freq: Two times a day (BID) | ORAL | 0 refills | Status: DC

## 2016-08-18 MED ORDER — HYDROCOD POLST-CPM POLST ER 10-8 MG/5ML PO SUER: 5.0000 mL | Freq: Two times a day (BID) | ORAL | 0 refills | Status: DC | PRN

## 2016-08-18 NOTE — Patient Instructions (Addendum)
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 Acute bronchitis causes these tubes to fill with mucus, which can make it hard to breathe. It can also cause coughing or wheezing. In adults, acute bronchitis usually goes away within 2 weeks. A cough caused by bronchitis may last up to 3 weeks. Smoking, allergies, and asthma can make the condition worse. Repeated episodes of bronchitis may cause further lung problems, such as chronic obstructive pulmonary disease (COPD). What are the causes? This condition can be caused by germs and by substances that irritate the lungs, including:  Cold and flu viruses. This condition is most often caused by the same virus that causes a cold.  Bacteria.  Exposure to tobacco smoke, dust, fumes, and air pollution. What increases the risk? This condition is more likely to develop in people who:  Have close contact with someone with acute bronchitis.  Are exposed to lung irritants, such as tobacco smoke, dust, fumes, and vapors.  Have a weak immune system.  Have a respiratory condition such as asthma. What are the signs or symptoms? Symptoms of this condition include:  A cough.  Coughing up clear, yellow, or green mucus.  Wheezing.  Chest congestion.  Shortness of breath.  A fever.  Body aches.  Chills.  A sore throat. How is this diagnosed? This condition is usually diagnosed with a physical exam. During the exam, your health care provider may order tests, such as chest X-rays, to rule out other conditions. He or she may also:  Test a sample of your mucus for bacterial infection.  Check the level of oxygen in your blood. This is done to check for pneumonia.  Do a chest X-ray or lung function testing to rule out pneumonia and other conditions.  Perform blood tests. Your health care provider will also ask about your symptoms and  medical history. How is this treated? Most cases of acute bronchitis clear up over time without treatment. Your health care provider may recommend:  Drinking more fluids. Drinking more makes your mucus thinner, which may make it easier to breathe.  Taking a medicine for a fever or cough.  Taking an antibiotic medicine.  Using an inhaler to help improve shortness of breath and to control a cough.  Using a cool mist vaporizer or humidifier to make it easier to breathe. Follow these instructions at home: Medicines  Take over-the-counter and prescription medicines only as told by your health care provider.  If you were prescribed an antibiotic, take it as told by your health care provider. Do not stop taking the antibiotic even if you start to feel better. General instructions  Get plenty of rest.  Drink enough fluids to keep your urine clear or pale yellow.  Avoid smoking and secondhand smoke. Exposure to cigarette smoke or irritating chemicals will make bronchitis worse. If you smoke and you need help quitting, ask your health care provider. Quitting smoking will help your lungs heal faster.  Use an inhaler, cool mist vaporizer, or humidifier as told by your health care provider.  Keep all follow-up visits as told by your health care provider. This is important. How is this prevented? To lower your risk of getting this condition again:  Wash your hands often with soap and water. If soap and water are not available, use hand sanitizer.  Avoid contact with people who have cold symptoms.  Try not  to touch your hands to your mouth, nose, or eyes.  Make sure to get the flu shot every year. Contact a health care provider if:  Your symptoms do not improve in 2 weeks of treatment. Get help right away if:  You cough up blood.  You have chest pain.  You have severe shortness of breath.  You become dehydrated.  You faint or keep feeling like you are going to faint.  You keep  vomiting.  You have a severe headache.  Your fever or chills gets worse. This information is not intended to replace advice given to you by your health care provider. Make sure you discuss any questions you have with your health care provider. Document Released: 09/10/2004 Document Revised: 02/26/2016 Document Reviewed: 01/22/2016 Elsevier Interactive Patient Education  2017 [employer].

## 2016-08-18 NOTE — Progress Notes (Deleted)
Impression and Recommendations:    No diagnosis found.   ***  No problem-specific Assessment & Plan notes found for this encounter.    No orders of the defined types were placed in this encounter.    New Prescriptions   No medications on file    Modified Medications   No medications on file    Discontinued Medications   CYANOCOBALAMIN (,VITAMIN B-12,) 1000 MCG/ML INJECTION    Inject 1,000 mg into the muscle every 30 (thirty) days.   TANZEUM 50 MG PEN        The patient was counseled, risk factors were discussed, anticipatory guidance given.  Gross side effects, risk and benefits, and alternatives of medications and treatment plan in general discussed with patient.  Patient is aware that all medications have potential side effects and we are unable to predict every side effect or drug-drug interaction that may occur.   Patient will call with any questions prior to using medication if they have concerns.  Expresses verbal understanding and consents to current therapy and treatment regimen.  No barriers to understanding were identified.  Red flag symptoms and signs discussed in detail.  Patient expressed understanding regarding what to do in case of emergency\urgent symptoms  No Follow-up on file.  Please see AVS handed out to patient at the end of our visit for further patient instructions/ counseling done pertaining to today's office visit.    Note: This document was prepared using Dragon voice recognition software and may include unintentional dictation errors.   --------------------------------------------------------------------------------------------------------------------------------------------------------------------------------------------------------------------------------------------    Subjective:    CC:  Chief Complaint  Patient presents with  . Cough    HPI: Rebecca Robertson is a 61 y.o. female who presents to [place] at  [facility] today for issues as discussed below.   ***   Wt Readings from Last 3 Encounters:  05/28/16 127 lb 14.4 oz (58 kg)  05/04/16 128 lb (58.1 kg)  03/04/16 127 lb (57.6 kg)   BP Readings from Last 3 Encounters:  05/28/16 107/67  05/04/16 108/61  03/04/16 121/76   Pulse Readings from Last 3 Encounters:  05/28/16 (!) 57  05/04/16 67  03/04/16 64   BMI Readings from Last 3 Encounters:  05/28/16 21.95 kg/m  05/04/16 21.97 kg/m  03/04/16 21.80 kg/m     Patient Care Team    Relationship Specialty Notifications Start End  Mellody Dance, DO PCP - General Family Medicine  03/04/16   Teena Irani, MD Consulting Physician Gastroenterology  06/02/16   Delrae Rend, MD Consulting Physician Endocrinology  06/02/16   Druscilla Brownie, MD Consulting Physician Dermatology  06/02/16   Nicholas Lose, MD Consulting Physician Hematology and Oncology  06/02/16   Bjorn Loser, MD Consulting Physician Urology  06/02/16     Patient Active Problem List   Diagnosis Date Noted  . Depression 05/28/2016  . B12 deficiency 05/28/2016  . SUI (stress urinary incontinence, female) 05/28/2016  . Hypertriglyceridemia 05/28/2016  . h/o Anxiety 03/08/2016  . Hyperlipidemia 03/04/2016  . Vitamin D insufficiency 03/04/2016  . History of breast cancer 03/04/2016  . Contact dermatitis and eczema due to cause 03/04/2016  . Closed fracture of proximal phalanx of toe 04/03/2014  . Diabetes ([place]) 03/27/2014  . 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   Allergies:  Allergies  Allergen Reactions  . Compazine Other (See Comments)    Possible seizure, unable to talk, eyes rolled toward back of head, mouth became dry.    Review of Systems  Constitutional:  Negative for chills and fever.  HENT: Positive for congestion and sinus pain.   Eyes: Negative for blurred vision and double vision.  Respiratory: Negative for shortness of breath and wheezing.   Cardiovascular: Positive for chest pain. Negative for palpitations and orthopnea.  Gastrointestinal: Negative for constipation, diarrhea, heartburn, nausea and vomiting.  Genitourinary: Negative for frequency and hematuria.  Musculoskeletal: Negative for falls.       Chronic jt pains  Skin: Negative for rash.  Neurological: Positive for dizziness. Negative for sensory change and focal weakness.  Endo/Heme/Allergies: Negative for polydipsia.  Psychiatric/Behavioral: Negative for depression and suicidal ideas. The patient is not nervous/anxious and does not have insomnia.      Objective:   There were no vitals taken for this visit. There is no height or weight on file to calculate BMI. General: Well Developed, well nourished, appropriate for stated age.  Neuro: Alert and oriented x3, extra-ocular muscles intact, sensation grossly intact.  HEENT: Normocephalic, atraumatic, neck supple, no carotid bruits appreciated  Skin: no gross rash. Cardiac: RRR, S1 S2 Respiratory: ECTA B/L, Not using accessory muscles, speaking in full sentences-unlabored. Vascular:  Ext warm, dry, pink; cap RF less 2 sec. Psych: No HI/SI, judgement and insight good, Euthymic mood. Full Affect.

## 2016-08-18 NOTE — Assessment & Plan Note (Signed)
FBS- running 132,

## 2016-08-18 NOTE — Progress Notes (Signed)
Acute Care Office visit  Assessment and plan:  1. Cough   2. Other acute sinusitis, recurrence not specified   3. Intercostal pain   4. Diabetes mellitus due to underlying condition with hyperosmolarity without coma, without long-term current use of insulin (HCC)    Anticipatory guidance and routine counseling done re: condition, txmnt options and need for follow up. All questions of patient's were answered.  - Viral vs Allergic vs Bacterial causes for pt's symptoms reveiwed.    - Supportive care and various OTC medications discussed in addition to any prescribed. - Call or RTC if new symptoms, or if no improvement or worse over next couple days.    Orders Placed This Encounter  Procedures  . DG Chest 2 View     New Prescriptions   AMOXICILLIN-CLAVULANATE (AUGMENTIN) 875-125 MG TABLET    Take 1 tablet by mouth 2 (two) times daily.   CHLORPHENIRAMINE-HYDROCODONE (TUSSIONEX) 10-8 MG/5ML SUER    Take 5 mLs by mouth every 12 (twelve) hours as needed for cough (cough, will cause drowsiness.).   PREDNISONE (DELTASONE) 50 MG TABLET    Take 1 tablet (50 mg total) by mouth daily.    Modified Medications   No medications on file    Discontinued Medications   CYANOCOBALAMIN (,VITAMIN B-12,) 1000 MCG/ML INJECTION    Inject 1,000 mg into the muscle every 30 (thirty) days.   TANZEUM 50 MG PEN         Gross side effects, risk and benefits, and alternatives of medications discussed with patient.  Patient is aware that all medications have potential side effects and we are unable to predict every sideeffect or drug-drug interaction that may occur.  Expresses verbal understanding and consents to current therapy plan and treatment regiment.  Return if symptoms worsen or fail to improve, for f/up for chronic issues.  Please see AVS handed out to patient at the end of our visit for additional patient instructions/ counseling done pertaining to today's office visit.  Note: This document  was prepared using Dragon voice recognition software and may include unintentional dictation errors.    Subjective:    Chief Complaint  Patient presents with  . Cough    HPI:  Pt presents with URI sx for 4 Days ago.      C/o rhinorrhea, ST, and + cough.- dry hacking, ears and sinuses hurt,    Into head or chest now,   Denies objective F/C,  No N/V/D, No SOB/DIB, No Rash.    taken anything for sx-  Benzonate, and flonase from the UC she got on Friday- 3 d ago.   Overall getting W-   UC--> seen on 29th, negative flu, negative strep- gave her tesselon perles and flonase.    Patient Active Problem List   Diagnosis Date Noted  . Depression 05/28/2016  . B12 deficiency 05/28/2016  . SUI (stress urinary incontinence, female) 05/28/2016  . Hypertriglyceridemia 05/28/2016  . h/o Anxiety 03/08/2016  . Hyperlipidemia 03/04/2016  . Vitamin D insufficiency 03/04/2016  . History of breast cancer 03/04/2016  . Contact dermatitis and eczema due to cause 03/04/2016  . Closed fracture of proximal phalanx of toe 04/03/2014  . Diabetes ([place]) 03/27/2014  . Neoplasm of left breast, primary tumor staging category Tis: ductal carcinoma in situ (DCIS) 02/10/2011    Past medical history, Surgical history, Family history reviewed and noted below, Social history, Allergies, and Medications have been entered into the medical record, reviewed and changed as needed.  Allergies  Allergen Reactions  . Compazine Other (See Comments)    Possible seizure, unable to talk, eyes rolled toward back of head, mouth became dry.    Review of Systems  Constitutional: Negative for chills and fever.  HENT: Positive for congestion and sinus pain.   Eyes: Negative for blurred vision and double vision.  Respiratory: Negative for shortness of breath and wheezing.   Cardiovascular: Positive for chest pain. Negative for palpitations and orthopnea.  Gastrointestinal: Negative for constipation, diarrhea, heartburn,  nausea and vomiting.  Genitourinary: Negative for frequency and hematuria.  Musculoskeletal: Negative for falls and joint pain.       Chronic jt pains  Skin: Negative for rash.  Neurological: Positive for dizziness. Negative for sensory change and focal weakness.  Endo/Heme/Allergies: Negative for polydipsia.  Psychiatric/Behavioral: Negative for depression and suicidal ideas. The patient is not nervous/anxious and does not have insomnia.     Objective:   Blood pressure 111/68, pulse 78, temperature 99.6 F (37.6 C), height 5' 4.75" (1.645 m), weight 130 lb 4.8 oz (59.1 kg), SpO2 97 %. Body mass index is 21.85 kg/m. General: Well Developed, well nourished, appropriate for stated age.  Neuro: Alert and oriented x3, extra-ocular muscles intact, sensation grossly intact.  HEENT: Normocephalic, atraumatic, pupils equal round reactive to light, neck supple, no masses, no painful lymphadenopathy, TM's intact B/L, no acute findings. Nares- patent, clear d/c, OP- clear, mild erythema, No TTP sinuses Skin: Warm and dry, no gross rash. Cardiac: RRR, S1 S2,  no murmurs rubs or gallops.  Respiratory:  ECTA B/L and A/P with some scattered wheezes coarse BS but o/w no dec aeration, Not using accessory muscles, speaking in full sentences- unlabored. Vascular:  No gross lower ext edema, cap RF less 2 sec. Psych: No HI/SI, judgement and insight good, Euthymic mood. Full Affect.   Patient Care Team    Relationship Specialty Notifications Start End  Mellody Dance, DO PCP - General Family Medicine  03/04/16   Teena Irani, MD Consulting Physician Gastroenterology  06/02/16   Delrae Rend, MD Consulting Physician Endocrinology  06/02/16   Druscilla Brownie, MD Consulting Physician Dermatology  06/02/16   Nicholas Lose, MD Consulting Physician Hematology and Oncology  06/02/16   Bjorn Loser, MD Consulting Physician Urology  06/02/16

## 2016-08-20 ENCOUNTER — Encounter: Payer: Self-pay | Admitting: Family Medicine

## 2016-08-20 ENCOUNTER — Other Ambulatory Visit: Payer: Self-pay

## 2016-08-20 MED ORDER — MOXIFLOXACIN HCL 400 MG PO TABS
400.0000 mg | ORAL_TABLET | Freq: Every day | ORAL | 0 refills | Status: DC
Start: 2016-08-20 — End: 2016-09-23

## 2016-08-24 ENCOUNTER — Ambulatory Visit (INDEPENDENT_AMBULATORY_CARE_PROVIDER_SITE_OTHER): Payer: BLUE CROSS/BLUE SHIELD | Admitting: Family Medicine

## 2016-08-24 ENCOUNTER — Encounter: Payer: Self-pay | Admitting: Family Medicine

## 2016-08-24 ENCOUNTER — Ambulatory Visit: Payer: BLUE CROSS/BLUE SHIELD

## 2016-08-24 VITALS — BP 114/70 | HR 58 | Temp 98.0°F | Resp 19 | Ht 64.0 in | Wt 125.0 lb

## 2016-08-24 DIAGNOSIS — M791 Myalgia: Secondary | ICD-10-CM

## 2016-08-24 DIAGNOSIS — R05 Cough: Secondary | ICD-10-CM

## 2016-08-24 DIAGNOSIS — R0782 Intercostal pain: Secondary | ICD-10-CM

## 2016-08-24 DIAGNOSIS — J189 Pneumonia, unspecified organism: Secondary | ICD-10-CM | POA: Diagnosis not present

## 2016-08-24 DIAGNOSIS — R059 Cough, unspecified: Secondary | ICD-10-CM

## 2016-08-24 DIAGNOSIS — M7918 Myalgia, other site: Secondary | ICD-10-CM

## 2016-08-24 DIAGNOSIS — D0512 Intraductal carcinoma in situ of left breast: Secondary | ICD-10-CM

## 2016-08-24 DIAGNOSIS — Z853 Personal history of malignant neoplasm of breast: Secondary | ICD-10-CM

## 2016-08-24 NOTE — Progress Notes (Signed)
Acute Care Office visit  Assessment and plan:  1. Intercostal pain   2. Community acquired pneumonia, unspecified laterality   3. Cough   4. Intercostal myalgia    - Did discuss with patient she can take a severe Tylenol Cold and flu or over-the-counter medicines like this medicine in addition to others.  - She is also taking taking Tussionex for cough when necessary. -  Out of work 2 more days - Continue Avelox for presumed pneumonia. - Chest x-ray today to rule out pneumothorax or rib fracture. - Patient understands she will need a follow-up chest x-ray in 3-4 weeks. This will be to check for resolution of pneumonia ( left upper lobe)  as well as reevaluate possible right upper lobe nodule versus infiltrate/ chronic bronchitic changes.   Anticipatory guidance and routine counseling done re: condition, txmnt options and need for follow up. All questions of patient's were answered.  - Viral vs Allergic vs Bacterial causes for pt's symptoms reveiwed.     - Supportive care and various OTC medications discussed in addition to any prescribed.  - Call or RTC if new symptoms, or if no improvement or worse over next couple days.     Orders Placed This Encounter  Procedures  . DG Chest 2 View     New Prescriptions   No medications on file    Modified Medications   No medications on file    Discontinued Medications   AMOXICILLIN-CLAVULANATE (AUGMENTIN) 875-125 MG TABLET    Take 1 tablet by mouth 2 (two) times daily.     Gross side effects, risk and benefits, and alternatives of medications discussed with patient.  Patient is aware that all medications have potential side effects and we are unable to predict every sideeffect or drug-drug interaction that may occur.  Expresses verbal understanding and consents to current therapy plan and treatment regiment.  Return if symptoms worsen or fail to improve.  Please see AVS handed out to patient at the end of our visit for  additional patient instructions/ counseling done pertaining to today's office visit.  Note: This document was prepared using Dragon voice recognition software and may include unintentional dictation errors.    Subjective:    Chief Complaint  Patient presents with  . Chest Pain    Rebecca Robertson complains of left side chest pain and productive cough with thick white sputum. She had a chest x-ray and it read Lingular atelectasis or pneumonia. She is still taking the Avelox as prescribed. Denies wheezing, fever, chills or sweats.    HPI:   Couple days ago- pain now localized in L side of chest- sharp and feels like it is cracked.   Pain 8-9/10 L side chest now.   COughing a lot- dry hacking.   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   Feels like something is not right and I don't what what.  - Started avelox after last CXR - tolerating well.   NO better or W than before.   Took all of her prednisone for 5 days.  Very little improvement despite txmnt      Patient Active Problem List   Diagnosis Date Noted  . Depression 05/28/2016  . B12 deficiency 05/28/2016  . SUI (stress urinary incontinence, female) 05/28/2016  . Hypertriglyceridemia 05/28/2016  . h/o Anxiety 03/08/2016  . Hyperlipidemia 03/04/2016  . Vitamin D insufficiency 03/04/2016  . History of breast cancer 03/04/2016  .  Contact dermatitis and eczema due to cause 03/04/2016  . Closed fracture of proximal phalanx of toe 04/03/2014  . Diabetes ([place]) 03/27/2014  . Neoplasm of left breast, primary tumor staging category Tis: ductal carcinoma in situ (DCIS) 02/10/2011    Past medical history, Surgical history, Family history reviewed and noted below, Social history, Allergies, and Medications have been entered into the medical record, reviewed and changed as needed.   Allergies  Allergen Reactions  . Compazine Other (See Comments)    Possible seizure, unable to talk,  eyes rolled toward back of head, mouth became dry.    Review of Systems  Constitutional: Positive for malaise/fatigue. Negative for chills, diaphoresis and fever.       Generally feels weak and ill  HENT: Positive for congestion. Negative for ear pain and sinus pain.   Eyes: Negative for pain and discharge.  Respiratory: Positive for cough and sputum production. Negative for shortness of breath, wheezing and stridor.   Cardiovascular: Positive for chest pain. Negative for palpitations, orthopnea, leg swelling and PND.  Gastrointestinal: Negative for abdominal pain, constipation, heartburn, nausea and vomiting.  Genitourinary: Negative for flank pain.  Musculoskeletal: Positive for joint pain and myalgias.       Muscles and rib L T5-8 anterior lateral aspect  Neurological: Positive for weakness. Negative for focal weakness.    Objective:   Blood pressure 114/70, pulse (!) 58, temperature 98 F (36.7 C), temperature source Oral, resp. rate 19, height 5\' 4"  (1.626 m), weight 125 lb (56.7 kg), SpO2 100 %. Body mass index is 21.46 kg/m. General: Well Developed, well nourished, appropriate for stated age.  Neuro: Alert and oriented x3, extra-ocular muscles intact, sensation grossly intact.  HEENT: Normocephalic, atraumatic, pupils equal round reactive to light, neck supple, no masses, no painful lymphadenopathy, Nares- patent, clear d/c, OP- clear, mild erythema Skin: Warm and dry, no gross rash. Cardiac: RRR, S1 S2,  no murmurs rubs or gallops.  Respiratory: ECTA B/L and A/P- coarse breath sounds but no wheeze, Not using accessory muscles, speaking in full sentences- unlabored. M-SK:  Tenderness along the anterior and anterolateral left ribs from T4-5 through T8 Vascular:  No gross lower ext edema, cap RF less 2 sec. Psych: No HI/SI, judgement and insight good, Euthymic mood. Full Affect.   Patient Care Team    Relationship Specialty Notifications Start End  Mellody Dance, DO PCP -  General Family Medicine  03/04/16   Teena Irani, MD Consulting Physician Gastroenterology  06/02/16   Delrae Rend, MD Consulting Physician Endocrinology  06/02/16   Druscilla Brownie, MD Consulting Physician Dermatology  06/02/16   Nicholas Lose, MD Consulting Physician Hematology and Oncology  06/02/16   Bjorn Loser, MD Consulting Physician Urology  06/02/16

## 2016-08-24 NOTE — Patient Instructions (Addendum)
You most likely have a viral infection that should resolve on its own over time.  - We'll need to be about another couple days from work. If it goes beyond that please let us know.  Symptoms for a viral upper respiratory tract infection usually last 3-7 days but can stretch out to 2-3 weeks before you're feeling back to normal.   Your symptoms should not worsen after 7-10 days and if they do, please notify our office, as you may need antibiotics.   Also, sterile saline nasal rinses, such as Milta Deiters med or AYR sinus rinses, can be very helpful and should be done twice daily- even throughout the allergy season.  Remember you should use distilled water or previously boiled water to do this.   You can also use an over the counter cold and flu medication such as Tylenol Severe Cold and Sinus/Flu or Dayquil, Nyquil and the like, which will help with cough, congestion, headache/ pain, fevers/chills etc.   Please note, if you being treated for hypertension or have high blood pressure, you should be using the ones designated "HBP".    Unfortunately, antibiotics are not helpful for viral infections.  Wash your hands frequently, as you did not want to get those around you sick as well. Never sneeze or cough on others.  And you should not be going to school or work if you are running a temperature of 100.5 or more on two separate occasions.   Drink plenty of fluids and stay hydrated, especially if you are running fevers.  We don't know why, but chicken soup also helps, try it! :)

## 2016-08-25 ENCOUNTER — Telehealth: Payer: Self-pay | Admitting: Family Medicine

## 2016-08-25 NOTE — Telephone Encounter (Signed)
Pt informed of CXR results. Pt expressed understanding and is agreeable. Bobetta Lime CMA, RT

## 2016-08-25 NOTE — Telephone Encounter (Signed)
Hi Rebecca Robertson, Baab called to check the results of her X-ray from yesterday--pls cll her back Thanks Glh

## 2016-09-04 ENCOUNTER — Telehealth: Payer: Self-pay | Admitting: Adult Health

## 2016-09-04 NOTE — Telephone Encounter (Signed)
I received a call from [place], our operator, that the patient had contacted her wanting to cancel her survivorship visit for next Friday, 09/11/16.  She tells me that she cannot afford to come to the cancer center any longer because she has a Dispensing optician. I expressed understanding.   When asked, she tells me she has a PCP whom she sees often; she also has a gynecologist who does her breast exams.  I recommended she continue to see these providers as directed.  I reinforced the importance of her continuing to get annual mammography.  She had questions about lab studies; I shared with her that collecting labs from a cancer standpoint is not necessary nor recommended by our national guidelines, as long as the patient has a PCP.  The labs collected were generally just basic labs looking at liver function, kidney function, blood counts, etc.  She understands there is no specific blood test/tumor marker for breast cancer.  She was vitamin D deficient on her last lab evaluation with her PCP and they recommended she start supplementation; she tells me she has not done so yet.  I reinforced the importance of vitamin D supplementation for women, particularly after menopause.  Encouraged her to follow the recommendations of her PCP.   I let her know that I would cancel her survivorship visit for next week, based on her wishes.  We will not try to contact her for additional follow-up/survivorship visits.  Encouraged her to call us at any time in the future if she has questions regarding her h/o breast cancer.     Mike Craze, NP [place] [phone]

## 2016-09-09 ENCOUNTER — Ambulatory Visit: Payer: BLUE CROSS/BLUE SHIELD | Admitting: Family Medicine

## 2016-09-11 ENCOUNTER — Encounter: Payer: BLUE CROSS/BLUE SHIELD | Admitting: Adult Health

## 2016-09-14 ENCOUNTER — Encounter: Payer: BLUE CROSS/BLUE SHIELD | Admitting: Nurse Practitioner

## 2016-09-14 LAB — HEMOGLOBIN A1C: Hemoglobin A1C: 7.7

## 2016-09-14 LAB — VITAMIN B12: Vitamin B-12: 222

## 2016-09-23 ENCOUNTER — Encounter: Payer: Self-pay | Admitting: Adult Health

## 2016-09-23 ENCOUNTER — Ambulatory Visit (INDEPENDENT_AMBULATORY_CARE_PROVIDER_SITE_OTHER): Payer: BLUE CROSS/BLUE SHIELD | Admitting: Adult Health

## 2016-09-23 ENCOUNTER — Ambulatory Visit
Admission: RE | Admit: 2016-09-23 | Discharge: 2016-09-23 | Disposition: A | Discharge status: Home / Self Care | Payer: BLUE CROSS/BLUE SHIELD | Source: Ambulatory Visit | Attending: Adult Health | Admitting: Adult Health

## 2016-09-23 DIAGNOSIS — J189 Pneumonia, unspecified organism: Secondary | ICD-10-CM | POA: Diagnosis not present

## 2016-09-23 DIAGNOSIS — K146 Glossodynia: Secondary | ICD-10-CM

## 2016-09-23 NOTE — Assessment & Plan Note (Signed)
Referral for CXR placed, please complete ASAP. Continue excellent hydration and eat a well balanced diet.

## 2016-09-23 NOTE — Progress Notes (Signed)
Subjective:    Patient ID: Rebecca Robertson, female    DOB: 1956-02-16, 61 y.o.   MRN: [id number]  HPI :  Ms. Flaten presents for f/u after ABX treatment of pneumonia. She completed full course of ABX and denies CP/dsypnea/fever/night sweats/HA/poor appetite/malaise/unexpected weight loss.  She reports mild fatigue that is relieved by "resting a bit".  She reports new onset of "burning tongue" sensation that began a couple of weeks ago.  Burning worsens when eating spicy foods and occurs most days of the week.  She denies GERD or any other GU sx's.     Patient Care Team    Relationship Specialty Notifications Start End  Mellody Dance, DO PCP - General Family Medicine  03/04/16   Teena Irani, MD Consulting Physician Gastroenterology  06/02/16   Delrae Rend, MD Consulting Physician Endocrinology  06/02/16   Druscilla Brownie, MD Consulting Physician Dermatology  06/02/16   Nicholas Lose, MD Consulting Physician Hematology and Oncology  06/02/16   Bjorn Loser, MD Consulting Physician Urology  06/02/16     Patient Active Problem List   Diagnosis Date Noted  . Pneumonia 09/23/2016  . 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 Depression 05/28/2016  . B12 deficiency 05/28/2016  . SUI (stress urinary incontinence, female) 05/28/2016  . Hypertriglyceridemia 05/28/2016  . h/o Anxiety 03/08/2016  . Hyperlipidemia 03/04/2016  . Vitamin D insufficiency 03/04/2016  . History of breast cancer 03/04/2016  . Contact dermatitis and eczema due to cause 03/04/2016  . Closed fracture of proximal phalanx of toe 04/03/2014  . Diabetes ([place]) 03/27/2014  . Neoplasm of left breast, primary tumor staging category Tis: ductal carcinoma in situ (DCIS) 02/10/2011     Past Medical History:  Diagnosis Date  . Anxiety   . Diabetes mellitus   . History of breast cancer ONCOLOGIST-- DR Humphrey Rolls--  NO RECURRENCE   S/P LEFT PARTIAL MASTECTOMY W/ SLN BX'S  12-02-2010---  DUCTAL CARCINOMA IN SITU--  S/P RADIATION  THERAPY ENDED 02-17-2011  . Hyperlipidemia   . Pneumonia   . PONV (postoperative nausea and vomiting)   . SUI (stress urinary incontinence, female)   . Vitamin D deficiency   . Vitamin D insufficiency 03/04/2016     Past Surgical History:  Procedure Laterality Date  . BLADDER SUSPENSION N/A 02/03/2013   Procedure: [facility] PROCEDURE;  Surgeon: Reece Packer, MD;  Location: [facility];  Service: Urology;  Laterality: N/A;  . BUNIONECTOMY    . CESAREAN SECTION     X4  . PARTIAL MASTECTOMY WITH AXILLARY SENTINEL LYMPH NODE BIOPSY Left 12-02-2010     Family History  Problem Relation Age of Onset  . Cancer Mother     lung  . Heart attack Father   . COPD Father   . Heart disease Father      History  Drug Use No     History  Alcohol Use No     History  Smoking Status  . Never Smoker  Smokeless Tobacco  . Never Used     Outpatient Encounter Prescriptions as of 09/23/2016  Medication Sig Note  . BAYER CONTOUR NEXT TEST test strip Check blood sugar daily 03/04/2016: Received from: External Pharmacy Received Sig:   . citalopram (CELEXA) 20 MG tablet  09/10/2015: Received from: External Pharmacy  . fenofibrate 160 MG tablet Take 160 mg by mouth daily.    . Fluocinolone Acetonide 0.01 % OIL Apply to skin as needed twice daily for rash.   Marland Kitchen glucosamine-chondroitin 500-400 MG tablet  Take 1 tablet by mouth daily. 05/28/2016: Received from: [facility] Received Sig: Take 1 tablet by mouth 3 times daily.  Marland Kitchen lovastatin (MEVACOR) 40 MG tablet  09/10/2015: Received from: External Pharmacy  . oxybutynin (DITROPAN) 5 MG tablet Take 5 mg by mouth 2 (two) times daily.   Marland Kitchen SYNJARDY 12.12-998 MG TABS  09/10/2015: Received from: External Pharmacy  . BYDUREON 2 MG PEN Inject 2 mg into the skin once a week.   . [DISCONTINUED] anastrozole (ARIMIDEX) 1 MG tablet Take 1 tablet (1 mg total) by mouth daily.   . [DISCONTINUED] benzonatate (TESSALON) 200 MG  capsule Take 1 capsule by mouth 3 (three) times daily. 08/18/2016: Received from: External Pharmacy  . [DISCONTINUED] chlorpheniramine-HYDROcodone (TUSSIONEX) 10-8 MG/5ML SUER Take 5 mLs by mouth every 12 (twelve) hours as needed for cough (cough, will cause drowsiness.).   . [DISCONTINUED] fluticasone (FLONASE) 50 MCG/ACT nasal spray Place 2 sprays into both nostrils daily. 08/18/2016: Received from: External Pharmacy  . [DISCONTINUED] moxifloxacin (AVELOX) 400 MG tablet Take 1 tablet (400 mg total) by mouth daily.   . [DISCONTINUED] predniSONE (DELTASONE) 50 MG tablet Take 1 tablet (50 mg total) by mouth daily.    No facility-administered encounter medications on file as of 09/23/2016.     Allergies: Compazine  Body mass index is 22.81 kg/m.  Blood pressure 100/63, pulse 73, height 5\' 4"  (1.626 m), weight 132 lb 14.4 oz (60.3 kg).    Review of Systems  Constitutional: Positive for fatigue. Negative for activity change, appetite change, chills, diaphoresis, fever and unexpected weight change.  HENT: Negative for congestion, sore throat, trouble swallowing and voice change.        Reports "burning mouth/tongue" when eating spicy foods.  This began a couple of weeks ago.  Eyes: Negative for visual disturbance.  Respiratory: Negative for cough, choking, chest tightness, shortness of breath, wheezing and stridor.   Cardiovascular: Negative for chest pain, palpitations and leg swelling.  Gastrointestinal: Negative for abdominal distention, constipation, diarrhea and nausea.  Endocrine: Negative for cold intolerance, heat intolerance, polydipsia, polyphagia and polyuria.  Genitourinary: Negative for difficulty urinating and flank pain.  Musculoskeletal: Negative for arthralgias.  Skin: Negative for color change, pallor, rash and wound.  Neurological: Negative for dizziness, tremors, syncope and weakness.       Objective:   Physical Exam  Constitutional: She is oriented to person, place, and  time. She appears well-developed and well-nourished. She appears distressed.  HENT:  Head: Normocephalic and atraumatic.  Right Ear: External ear normal.  Left Ear: External ear normal.  Mouth/Throat: Uvula is midline and oropharynx is clear and moist. Mucous membranes are not pale, not dry and not cyanotic. No oral lesions. No trismus in the jaw. Normal dentition. No dental abscesses, uvula swelling, lacerations or dental caries. No oropharyngeal exudate, posterior oropharyngeal edema, posterior oropharyngeal erythema or tonsillar abscesses.  Cardiovascular: Normal rate, regular rhythm, normal heart sounds and intact distal pulses.   No murmur heard. Pulmonary/Chest: Effort normal and breath sounds normal. No respiratory distress. She has no wheezes. She has no rales. She exhibits no tenderness.  Abdominal: Soft. Bowel sounds are normal.  Neurological: She is alert and oriented to person, place, and time. She has normal reflexes.  Skin: Skin is warm and dry. No rash noted. She is not diaphoretic. No erythema. No pallor.  Psychiatric: She has a normal mood and affect. Her behavior is normal. Judgment and thought content normal.  Nursing note and vitals reviewed.  Assessment & Plan:   1. Pneumonia due to infectious organism, unspecified laterality, unspecified part of lung   2. Burning mouth syndrome     Pneumonia Referral for CXR placed, please complete ASAP. Continue excellent hydration and eat a well balanced diet.    Burning mouth syndrome Please monitor sx's of "Burning Mouth" and if still present/worsening in 8 weeks then return for re-evaluation.    FOLLOW-UP:  Return if symptoms worsen or fail to improve.

## 2016-09-23 NOTE — Patient Instructions (Signed)
Community-Acquired Pneumonia, Adult Introduction Pneumonia is an infection of the lungs. One type of pneumonia can happen while a person is in a hospital. A different type can happen when a person is not in a hospital (community-acquired pneumonia). It is easy for this kind to spread from person to person. It can spread to you if you breathe near an infected person who coughs or sneezes. Some symptoms include:  A dry cough.  A wet (productive) cough.  Fever.  Sweating.  Chest pain. Follow these instructions at home:  Take over-the-counter and prescription medicines only as told by your doctor.  Only take cough medicine if you are losing sleep.  If you were prescribed an antibiotic medicine, take it as told by your doctor. Do not stop taking the antibiotic even if you start to feel better.  Sleep with your head and neck raised (elevated). You can do this by putting a few pillows under your head, or you can sleep in a recliner.  Do not use tobacco products. These include cigarettes, chewing tobacco, and e-cigarettes. If you need help quitting, ask your doctor.  Drink enough water to keep your pee (urine) clear or pale yellow. A shot (vaccine) can help prevent pneumonia. Shots are often suggested for:  People older than 60 years of age.  People older than 60 years of age:  Who are having cancer treatment.  Who have long-term (chronic) lung disease.  Who have problems with their body's defense system (immune system). You may also prevent pneumonia if you take these actions:  Get the flu (influenza) shot every year.  Go to the dentist as often as told.  Wash your hands often. If soap and water are not available, use hand sanitizer. Contact a doctor if:  You have a fever.  You lose sleep because your cough medicine does not help. Get help right away if:  You are short of breath and it gets worse.  You have more chest pain.  Your sickness gets worse. This is very  serious if:  You are an older adult.  Your body's defense system is weak.  You cough up blood. This information is not intended to replace advice given to you by your health care provider. Make sure you discuss any questions you have with your health care provider. Document Released: 01/20/2008 Document Revised: 01/09/2016 Document Reviewed: 11/28/2014  2017 [employer]vier  CXR referral placed, please complete at earliest convenience.   If "Burning Mouth Syndrome" continues, then please schedule appt in 8 weeks.

## 2016-09-23 NOTE — Assessment & Plan Note (Signed)
Please monitor sx's of "Burning Mouth" and if still present/worsening in 8 weeks then return for re-evaluation.

## 2016-09-29 ENCOUNTER — Other Ambulatory Visit: Payer: Self-pay | Admitting: Family Medicine

## 2016-09-29 DIAGNOSIS — Z853 Personal history of malignant neoplasm of breast: Secondary | ICD-10-CM

## 2016-09-29 DIAGNOSIS — Z9889 Other specified postprocedural states: Secondary | ICD-10-CM

## 2016-10-22 ENCOUNTER — Ambulatory Visit: Payer: BLUE CROSS/BLUE SHIELD | Admitting: Family Medicine

## 2016-10-28 ENCOUNTER — Other Ambulatory Visit: Payer: Self-pay

## 2016-10-28 MED ORDER — FENOFIBRATE 160 MG PO TABS: 160.0000 mg | ORAL_TABLET | Freq: Every day | ORAL | 0 refills | Status: DC

## 2016-10-28 NOTE — Telephone Encounter (Signed)
We have not prescribed this medication for the patient before.  Please refill if appropriate.  Charyl Bigger, CMA

## 2016-10-30 ENCOUNTER — Ambulatory Visit: Payer: BLUE CROSS/BLUE SHIELD | Admitting: Family Medicine

## 2016-11-03 ENCOUNTER — Ambulatory Visit: Payer: BLUE CROSS/BLUE SHIELD | Admitting: Adult Health

## 2016-11-09 ENCOUNTER — Other Ambulatory Visit (HOSPITAL_COMMUNITY)
Admission: RE | Admit: 2016-11-09 | Discharge: 2016-11-09 | Disposition: A | Discharge status: Home / Self Care | Payer: BLUE CROSS/BLUE SHIELD | Source: Ambulatory Visit | Attending: Adult Health | Admitting: Adult Health

## 2016-11-09 ENCOUNTER — Ambulatory Visit (INDEPENDENT_AMBULATORY_CARE_PROVIDER_SITE_OTHER): Payer: BLUE CROSS/BLUE SHIELD | Admitting: Adult Health

## 2016-11-09 ENCOUNTER — Encounter: Payer: Self-pay | Admitting: Adult Health

## 2016-11-09 VITALS — BP 110/70 | HR 65 | Ht 64.0 in | Wt 132.7 lb

## 2016-11-09 DIAGNOSIS — K146 Glossodynia: Secondary | ICD-10-CM

## 2016-11-09 DIAGNOSIS — Z01419 Encounter for gynecological examination (general) (routine) without abnormal findings: Secondary | ICD-10-CM

## 2016-11-09 DIAGNOSIS — Z124 Encounter for screening for malignant neoplasm of cervix: Secondary | ICD-10-CM | POA: Diagnosis not present

## 2016-11-09 NOTE — Assessment & Plan Note (Signed)
PAP completed-chaperone in room. Will call when results are available. Continue regular f/u with OB/GYN.

## 2016-11-09 NOTE — Assessment & Plan Note (Signed)
Completely resolved since last encounter.

## 2016-11-09 NOTE — Progress Notes (Signed)
Subjective:    Patient ID: Rebecca Robertson, female    DOB: 1956/01/31, 61 y.o.   MRN: [id number]  HPI:  Rebecca Robertson presents for PAP and wellness check.  She reports that the "burning mouth syndrome" has completely resolved.  She denies CP/dyspnea/dizziness/HA/neuropathy/palpitations.   Patient Care Team    Relationship Specialty Notifications Start End  Mellody Dance, DO PCP - General Family Medicine  03/04/16   Teena Irani, MD Consulting Physician Gastroenterology  06/02/16   Delrae Rend, MD Consulting Physician Endocrinology  06/02/16   Druscilla Brownie, MD Consulting Physician Dermatology  06/02/16   Nicholas Lose, MD Consulting Physician Hematology and Oncology  06/02/16   Bjorn Loser, MD Consulting Physician Urology  06/02/16     Patient Active Problem List   Diagnosis Date Noted  . Pneumonia 09/23/2016  . 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 Depression 05/28/2016  . B12 deficiency 05/28/2016  . SUI (stress urinary incontinence, female) 05/28/2016  . Hypertriglyceridemia 05/28/2016  . h/o Anxiety 03/08/2016  . Hyperlipidemia 03/04/2016  . Vitamin D insufficiency 03/04/2016  . History of breast cancer 03/04/2016  . Contact dermatitis and eczema due to cause 03/04/2016  . Closed fracture of proximal phalanx of toe 04/03/2014  . Diabetes ([place]) 03/27/2014  . Neoplasm of left breast, primary tumor staging category Tis: ductal carcinoma in situ (DCIS) 02/10/2011     Past Medical History:  Diagnosis Date  . Anxiety   . Diabetes mellitus   . History of breast cancer ONCOLOGIST-- DR Humphrey Rolls--  NO RECURRENCE   S/P LEFT PARTIAL MASTECTOMY W/ SLN BX'S  12-02-2010---  DUCTAL CARCINOMA IN SITU--  S/P RADIATION THERAPY ENDED 02-17-2011  . Hyperlipidemia   . Pneumonia   . PONV (postoperative nausea and vomiting)   . SUI (stress urinary incontinence, female)   . Vitamin D deficiency   . Vitamin D insufficiency 03/04/2016     Past Surgical History:  Procedure Laterality  Date  . BLADDER SUSPENSION N/A 02/03/2013   Procedure: [facility] PROCEDURE;  Surgeon: Reece Packer, MD;  Location: [facility];  Service: Urology;  Laterality: N/A;  . BUNIONECTOMY    . CESAREAN SECTION     X4  . PARTIAL MASTECTOMY WITH AXILLARY SENTINEL LYMPH NODE BIOPSY Left 12-02-2010     Family History  Problem Relation Age of Onset  . Cancer Mother     lung  . Heart attack Father   . COPD Father   . Heart disease Father      History  Drug Use No     History  Alcohol Use No     History  Smoking Status  . Never Smoker  Smokeless Tobacco  . Never Used     Outpatient Encounter Prescriptions as of 11/09/2016  Medication Sig Note  . BAYER CONTOUR NEXT TEST test strip Check blood sugar daily 03/04/2016: Received from: External Pharmacy Received Sig:   . citalopram (CELEXA) 20 MG tablet  09/10/2015: Received from: External Pharmacy  . fenofibrate 160 MG tablet Take 1 tablet (160 mg total) by mouth daily.   . Fluocinolone Acetonide 0.01 % OIL Apply to skin as needed twice daily for rash.   Marland Kitchen glucosamine-chondroitin 500-400 MG tablet Take 1 tablet by mouth daily. 05/28/2016: Received from: [facility] Received Sig: Take 1 tablet by mouth 3 times daily.  Marland Kitchen lovastatin (MEVACOR) 40 MG tablet  09/10/2015: Received from: External Pharmacy  . oxybutynin (DITROPAN) 5 MG tablet Take 5 mg by mouth 2 (two) times  daily.   Marland Kitchen SYNJARDY 12.12-998 MG TABS  09/10/2015: Received from: External Pharmacy  . [DISCONTINUED] BYDUREON 2 MG PEN Inject 2 mg into the skin once a week.    No facility-administered encounter medications on file as of 11/09/2016.     Allergies: Compazine and Bydureon [exenatide]  Body mass index is 22.78 kg/m.  Blood pressure 110/70, pulse 65, height 5\' 4"  (1.626 m), weight 132 lb 11.2 oz (60.2 kg).     Review of Systems  Constitutional: Negative for activity change, appetite change, chills, diaphoresis, fatigue, fever  and unexpected weight change.  Eyes: Negative for visual disturbance.  Respiratory: Negative for cough, chest tightness, shortness of breath, wheezing and stridor.   Cardiovascular: Negative for chest pain and leg swelling.  Gastrointestinal: Negative for abdominal distention, abdominal pain, blood in stool, constipation, diarrhea, nausea and rectal pain.  Endocrine: Negative for cold intolerance, heat intolerance, polydipsia, polyphagia and polyuria.  Genitourinary: Negative for difficulty urinating, flank pain and hematuria.       Vaginal dryness and dyspareunia-followed by GYN for these issues.  Skin: Negative for color change, pallor, rash and wound.  Allergic/Immunologic: Negative for immunocompromised state.  Neurological: Negative for tremors, facial asymmetry, weakness, light-headedness and headaches.       Objective:   Physical Exam  Constitutional: She is oriented to person, place, and time. She appears well-developed and well-nourished. No distress.  HENT:  Head: Normocephalic and atraumatic.  Eyes: Conjunctivae are normal. Pupils are equal, round, and reactive to light.  Neck: Normal range of motion.  Cardiovascular: Normal rate, regular rhythm, normal heart sounds and intact distal pulses.   No murmur heard. Pulmonary/Chest: Effort normal and breath sounds normal. No respiratory distress. She has no wheezes. She has no rales. She exhibits no tenderness.  Abdominal: Soft. Bowel sounds are normal. She exhibits no distension and no mass. There is no tenderness. There is no rebound and no guarding. Hernia confirmed negative in the right inguinal area and confirmed negative in the left inguinal area.  Genitourinary: Uterus normal. No breast swelling, tenderness, discharge or bleeding. Pelvic exam was performed with patient supine. No tenderness or bleeding in the vagina. No vaginal discharge found.  Lymphadenopathy:    She has no cervical adenopathy.       Right: No inguinal  adenopathy present.       Left: No inguinal adenopathy present.  Neurological: She is alert and oriented to person, place, and time.  Skin: Skin is warm and dry. No rash noted. She is not diaphoretic. No erythema. No pallor.  Psychiatric: She has a normal mood and affect. Her behavior is normal. Judgment and thought content normal.  Nursing note and vitals reviewed.         Assessment & Plan:   1. Screening for cervical cancer   2. Cervical smear, as part of routine gynecological examination   3. Burning mouth syndrome     Cervical smear, as part of routine gynecological examination PAP completed-chaperone in room. Will call when results are available. Continue regular f/u with OB/GYN.  Burning mouth syndrome Completely resolved since last encounter.    FOLLOW-UP:  Return in about 7 months (around 06/11/2017) for Regular Follow Up.

## 2016-11-09 NOTE — Patient Instructions (Signed)
Heart-Healthy Eating Plan Many factors influence your heart health, including eating and exercise habits. Heart (coronary) risk increases with abnormal blood fat (lipid) levels. Heart-healthy meal planning includes limiting unhealthy fats, increasing healthy fats, and making other small dietary changes. This includes maintaining a healthy body weight to help keep lipid levels within a normal range. What is my plan? Your health care provider recommends that you:  Get no more than _________% of the total calories in your daily diet from fat.  Limit your intake of saturated fat to less than _________% of your total calories each day.  Limit the amount of cholesterol in your diet to less than _________ mg per day. What types of fat should I choose?  Choose healthy fats more often. Choose monounsaturated and polyunsaturated fats, such as olive oil and canola oil, flaxseeds, walnuts, almonds, and seeds.  Eat more omega-3 fats. Good choices include salmon, mackerel, sardines, tuna, flaxseed oil, and ground flaxseeds. Aim to eat fish at least two times each week.  Limit saturated fats. Saturated fats are primarily found in animal products, such as meats, butter, and cream. Plant sources of saturated fats include palm oil, palm kernel oil, and coconut oil.  Avoid foods with partially hydrogenated oils in them. These contain trans fats. Examples of foods that contain trans fats are stick margarine, some tub margarines, cookies, crackers, and other baked goods. What general guidelines do I need to follow?  Check food labels carefully to identify foods with trans fats or high amounts of saturated fat.  Fill one half of your plate with vegetables and green salads. Eat 4-5 servings of vegetables per day. A serving of vegetables equals 1 cup of raw leafy vegetables,  cup of raw or cooked cut-up vegetables, or  cup of vegetable juice.  Fill one fourth of your plate with whole grains. Look for the word  "whole" as the first word in the ingredient list.  Fill one fourth of your plate with lean protein foods.  Eat 4-5 servings of fruit per day. A serving of fruit equals one medium whole fruit,  cup of dried fruit,  cup of fresh, frozen, or canned fruit, or  cup of 100% fruit juice.  Eat more foods that contain soluble fiber. Examples of foods that contain this type of fiber are apples, broccoli, carrots, beans, peas, and barley. Aim to get 20-30 g of fiber per day.  Eat more home-cooked food and less restaurant, buffet, and fast food.  Limit or avoid alcohol.  Limit foods that are high in starch and sugar.  Avoid fried foods.  Cook foods by using methods other than frying. Baking, boiling, grilling, and broiling are all great options. Other fat-reducing suggestions include:  Removing the skin from poultry.  Removing all visible fats from meats.  Skimming the fat off of stews, soups, and gravies before serving them.  Steaming vegetables in water or broth.  Lose weight if you are overweight. Losing just 5-10% of your initial body weight can help your overall health and prevent diseases such as diabetes and heart disease.  Increase your consumption of nuts, legumes, and seeds to 4-5 servings per week. One serving of dried beans or legumes equals  cup after being cooked, one serving of nuts equals 1 ounces, and one serving of seeds equals  ounce or 1 tablespoon.  You may need to monitor your salt (sodium) intake, especially if you have high blood pressure. Talk with your health care provider or dietitian to get more  information about reducing sodium. What foods can I eat? Grains   Breads, including [place], white, pita, wheat, raisin, rye, oatmeal, and [place]. Tortillas that are neither fried nor made with lard or trans fat. Low-fat rolls, including hotdog and hamburger buns and English muffins. Biscuits. Muffins. Waffles. Pancakes. Light popcorn. Whole-grain cereals. Flatbread.  Melba toast. Pretzels. Breadsticks. Rusks. Low-fat snacks and crackers, including oyster, saltine, matzo, graham, animal, and rye. Rice and pasta, including [place] rice and those that are made with whole wheat. Vegetables  All vegetables. Fruits  All fruits, but limit coconut. Meats and Other Protein Sources  Lean, well-trimmed beef, veal, pork, and lamb. Chicken and [place] without skin. All fish and shellfish. Wild duck, rabbit, pheasant, and venison. Egg whites or low-cholesterol egg substitutes. Dried beans, peas, lentils, and tofu.Seeds and most nuts. Dairy  Low-fat or nonfat cheeses, including ricotta, string, and mozzarella. Skim or 1% milk that is liquid, powdered, or evaporated. Buttermilk that is made with low-fat milk. Nonfat or low-fat yogurt. Beverages  Mineral water. Diet carbonated beverages. Sweets and Desserts  Sherbets and fruit ices. Honey, jam, marmalade, jelly, and syrups. Meringues and gelatins. Pure sugar candy, such as hard candy, jelly beans, gumdrops, mints, marshmallows, and small amounts of dark chocolate. [employer] Eat all sweets and desserts in moderation. Fats and Oils  Nonhydrogenated (trans-free) margarines. Vegetable oils, including soybean, sesame, sunflower, olive, peanut, safflower, corn, canola, and cottonseed. Salad dressings or mayonnaise that are made with a vegetable oil. Limit added fats and oils that you use for cooking, baking, salads, and as spreads. Other  Cocoa powder. Coffee and tea. All seasonings and condiments. The items listed above may not be a complete list of recommended foods or beverages. Contact your dietitian for more options.  What foods are not recommended? Grains  Breads that are made with saturated or trans fats, oils, or whole milk. Croissants. Butter rolls. Cheese breads. Sweet rolls. Donuts. Buttered popcorn. Chow mein noodles. High-fat crackers, such as cheese or butter crackers. Meats and Other Protein Sources  Fatty  meats, such as hotdogs, short ribs, sausage, spareribs, bacon, ribeye roast or steak, and mutton. High-fat deli meats, such as salami and bologna. Caviar. Domestic duck and goose. Organ meats, such as kidney, liver, sweetbreads, brains, gizzard, chitterlings, and heart. Dairy  Cream, sour cream, cream cheese, and creamed cottage cheese. Whole milk cheeses, including blue (bleu), Monterey Jack, [place], [place], American, [place], Swiss, [place], [place], and [place]. Whole or 2% milk that is liquid, evaporated, or condensed. Whole buttermilk. Cream sauce or high-fat cheese sauce. Yogurt that is made from whole milk. Beverages  Regular sodas and drinks with added sugar. Sweets and Desserts  Frosting. Pudding. Cookies. Cakes other than angel food cake. Candy that has milk chocolate or white chocolate, hydrogenated fat, butter, coconut, or unknown ingredients. Buttered syrups. Full-fat ice cream or ice cream drinks. Fats and Oils  Gravy that has suet, meat fat, or shortening. Cocoa butter, hydrogenated oils, palm oil, coconut oil, palm kernel oil. These can often be found in baked products, candy, fried foods, nondairy creamers, and whipped toppings. Solid fats and shortenings, including bacon fat, salt pork, lard, and butter. Nondairy cream substitutes, such as coffee creamers and sour cream substitutes. Salad dressings that are made of unknown oils, cheese, or sour cream. The items listed above may not be a complete list of foods and beverages to avoid. Contact your dietitian for more information.  This information is not intended to replace advice given to you by  your health care provider. Make sure you discuss any questions you have with your health care provider. Document Released: 05/12/2008 Document Revised: 02/21/2016 Document Reviewed: 01/25/2014 Elsevier Interactive Patient Education  2017 [place] healthy eating and regular movement. Continue all medications as directed. Will  call when PAP results are available. Please follow-up Oct 2018, sooner if needed.

## 2016-11-11 LAB — CYTOLOGY - PAP: Diagnosis: NEGATIVE

## 2016-11-27 ENCOUNTER — Ambulatory Visit
Admission: RE | Admit: 2016-11-27 | Discharge: 2016-11-27 | Disposition: A | Discharge status: Home / Self Care | Payer: BLUE CROSS/BLUE SHIELD | Source: Ambulatory Visit | Attending: Family Medicine | Admitting: Family Medicine

## 2016-11-27 DIAGNOSIS — Z853 Personal history of malignant neoplasm of breast: Secondary | ICD-10-CM

## 2016-11-27 DIAGNOSIS — Z9889 Other specified postprocedural states: Secondary | ICD-10-CM

## 2016-12-08 ENCOUNTER — Other Ambulatory Visit: Payer: Self-pay

## 2016-12-08 LAB — HEMOGLOBIN A1C: Hemoglobin A1C: 7.3

## 2016-12-08 NOTE — Telephone Encounter (Signed)
The sig and exactly how she takes these meds is not listed.  Please confirm with pt's pharmacy dose and sig so I can RF.   Thnx

## 2016-12-08 NOTE — Telephone Encounter (Signed)
We have not prescribed these medications for the patient previously.  Please review and refill if appropriate.  T. Arna Luis, CMA  

## 2016-12-09 MED ORDER — LOVASTATIN 40 MG PO TABS
40.0000 mg | ORAL_TABLET | Freq: Every day | ORAL | 0 refills | Status: DC
Start: 2016-12-09 — End: 2017-04-03

## 2016-12-09 MED ORDER — CITALOPRAM HYDROBROMIDE 20 MG PO TABS: 20.0000 mg | ORAL_TABLET | Freq: Every day | ORAL | 0 refills | Status: DC

## 2016-12-09 MED ORDER — OXYBUTYNIN CHLORIDE 5 MG PO TABS: 5.0000 mg | ORAL_TABLET | Freq: Two times a day (BID) | ORAL | 0 refills | Status: DC

## 2016-12-09 NOTE — Telephone Encounter (Signed)
Please call pt's pharmacy and see how pt takes these meds

## 2016-12-09 NOTE — Telephone Encounter (Signed)
Great, thanks, ok to RF all.

## 2016-12-09 NOTE — Telephone Encounter (Signed)
I have already confirmed that these are the correct dosages and sigs.  Charyl Bigger, CMA

## 2017-02-11 ENCOUNTER — Other Ambulatory Visit: Payer: Self-pay | Admitting: Adult Health

## 2017-03-10 LAB — MICROALBUMIN, URINE
MICROALB UR: 0.77
Microalb, Ur: 0.77

## 2017-03-10 LAB — HEMOGLOBIN A1C
HEMOGLOBIN A1C: 6.5
Hemoglobin A1C: 6.5

## 2017-03-29 ENCOUNTER — Other Ambulatory Visit: Payer: Self-pay | Admitting: Family Medicine

## 2017-04-03 ENCOUNTER — Other Ambulatory Visit: Payer: Self-pay | Admitting: Family Medicine

## 2017-04-07 ENCOUNTER — Other Ambulatory Visit: Payer: Self-pay

## 2017-04-07 ENCOUNTER — Encounter: Payer: Self-pay | Admitting: Adult Health

## 2017-04-07 ENCOUNTER — Ambulatory Visit (INDEPENDENT_AMBULATORY_CARE_PROVIDER_SITE_OTHER): Payer: BLUE CROSS/BLUE SHIELD | Admitting: Adult Health

## 2017-04-07 VITALS — BP 110/66 | HR 78 | Temp 98.3°F | Ht 64.0 in | Wt 124.6 lb

## 2017-04-07 DIAGNOSIS — R8290 Unspecified abnormal findings in urine: Secondary | ICD-10-CM | POA: Diagnosis not present

## 2017-04-07 DIAGNOSIS — R300 Dysuria: Secondary | ICD-10-CM | POA: Diagnosis not present

## 2017-04-07 DIAGNOSIS — N390 Urinary tract infection, site not specified: Secondary | ICD-10-CM | POA: Insufficient documentation

## 2017-04-07 LAB — POCT URINALYSIS DIPSTICK
Bilirubin, UA: NEGATIVE
Glucose, UA: >1000
Ketones, UA: NEGATIVE
NITRITE UA: POSITIVE
PROTEIN UA: 30
SPEC GRAV UA: 1.025 (ref 1.010–1.025)
Urobilinogen, UA: 0.2 E.U./dL
pH, UA: 5.5 (ref 5.0–8.0)

## 2017-04-07 MED ORDER — CIPROFLOXACIN HCL 250 MG PO TABS: 250.0000 mg | ORAL_TABLET | Freq: Two times a day (BID) | ORAL | 0 refills | Status: DC

## 2017-04-07 NOTE — Assessment & Plan Note (Signed)
UA +, specimen sent for C/S Cipro 250mg  BID x 3 day Continue to push fluids. Call if sx's persist after ABX completed.

## 2017-04-07 NOTE — Patient Instructions (Addendum)

## 2017-04-07 NOTE — Progress Notes (Signed)
Subjective:    Patient ID: Rebecca Robertson, female    DOB: [birth year], 61 y.o.   MRN: [id number]  HPI:  Rebecca Robertson presents with dysuria, frequency, small amount of urine when voiding, and abdominal pressure that all began yesterday morning.  She has been pushing water and cranberry juice.  She denies fever/night sweats/N/V/D/blood in urine or stool. She denies back/flank pain. She reports that "Cipro works great when I get a UTI".  Patient Care Team    Relationship Specialty Notifications Start End  Mellody Dance, DO PCP - General Family Medicine  03/04/16   Teena Irani, MD Consulting Physician Gastroenterology  06/02/16   Delrae Rend, MD Consulting Physician Endocrinology  06/02/16   Druscilla Brownie, MD Consulting Physician Dermatology  06/02/16   Nicholas Lose, MD Consulting Physician Hematology and Oncology  06/02/16   Bjorn Loser, MD Consulting Physician Urology  06/02/16     Patient Active Problem List   Diagnosis Date Noted  . UTI (urinary tract infection) 04/07/2017  . Cervical smear, as part of routine gynecological examination 11/09/2016  . Pneumonia 09/23/2016  . 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 Depression 05/28/2016  . B12 deficiency 05/28/2016  . SUI (stress urinary incontinence, female) 05/28/2016  . Hypertriglyceridemia 05/28/2016  . h/o Anxiety 03/08/2016  . Hyperlipidemia 03/04/2016  . Vitamin D insufficiency 03/04/2016  . History of breast cancer 03/04/2016  . Contact dermatitis and eczema due to cause 03/04/2016  . Closed fracture of proximal phalanx of toe 04/03/2014  . Diabetes ([place]) 03/27/2014  . Neoplasm of left breast, primary tumor staging category Tis: ductal carcinoma in situ (DCIS) 02/10/2011     Past Medical History:  Diagnosis Date  . Anxiety   . Diabetes mellitus   . History of breast cancer ONCOLOGIST-- DR Humphrey Rolls--  NO RECURRENCE   S/P LEFT PARTIAL MASTECTOMY W/ SLN BX'S  12-02-2010---  DUCTAL CARCINOMA IN SITU--  S/P  RADIATION THERAPY ENDED 02-17-2011  . Hyperlipidemia   . Pneumonia   . PONV (postoperative nausea and vomiting)   . SUI (stress urinary incontinence, female)   . Vitamin D deficiency   . Vitamin D insufficiency 03/04/2016     Past Surgical History:  Procedure Laterality Date  . BLADDER SUSPENSION N/A 02/03/2013   Procedure: [facility] PROCEDURE;  Surgeon: Reece Packer, MD;  Location: [facility];  Service: Urology;  Laterality: N/A;  . BUNIONECTOMY    . CESAREAN SECTION     X4  . PARTIAL MASTECTOMY WITH AXILLARY SENTINEL LYMPH NODE BIOPSY Left 12-02-2010     Family History  Problem Relation Age of Onset  . Cancer Mother        lung  . Heart attack Father   . COPD Father   . Heart disease Father      History  Drug Use No     History  Alcohol Use No     History  Smoking Status  . Never Smoker  Smokeless Tobacco  . Never Used     Outpatient Encounter Prescriptions as of 04/07/2017  Medication Sig Note  . BAYER CONTOUR NEXT TEST test strip Check blood sugar daily 03/04/2016: Received from: External Pharmacy Received Sig:   . citalopram (CELEXA) 20 MG tablet Take 1 tablet (20 mg total) by mouth daily.   . fenofibrate 160 MG tablet TAKE 1 TABLET BY MOUTH DAILY.   Marland Kitchen Fluocinolone Acetonide 0.01 % OIL Apply to skin as needed twice daily for rash.   Marland Kitchen glucosamine-chondroitin 500-400 MG tablet Take  1 tablet by mouth daily. 05/28/2016: Received from: [facility] Received Sig: Take 1 tablet by mouth 3 times daily.  Marland Kitchen lovastatin (MEVACOR) 40 MG tablet TAKE 1 TABLET BY MOUTH DAILY AT 8 PM.   . oxybutynin (DITROPAN) 5 MG tablet TAKE 1 TABLET BY MOUTH 2 TIMES DAILY.   Marland Kitchen OZEMPIC 0.25 or 0.5 MG/DOSE SOPN Inject 0.25 mg into the skin once a week.   Marland Kitchen SYNJARDY 12.12-998 MG TABS  09/10/2015: Received from: External Pharmacy  . ciprofloxacin (CIPRO) 250 MG tablet Take 1 tablet (250 mg total) by mouth 2 (two) times daily.    No  facility-administered encounter medications on file as of 04/07/2017.     Allergies: Compazine and Bydureon [exenatide]  Body mass index is 21.39 kg/m.  Blood pressure 110/66, pulse 78, temperature 98.3 F (36.8 C), temperature source Oral, height 5\' 4"  (1.626 m), weight 124 lb 9.6 oz (56.5 kg).    Review of Systems  Constitutional: Positive for fatigue. Negative for activity change, appetite change, chills, diaphoresis, fever and unexpected weight change.  Respiratory: Negative for cough, chest tightness, shortness of breath, wheezing and stridor.   Cardiovascular: Negative for chest pain, palpitations and leg swelling.  Gastrointestinal: Negative for abdominal distention, anal bleeding, blood in stool, constipation, diarrhea, nausea and vomiting.       Reports "bladder pressure" since yesterday am  Endocrine: Negative for cold intolerance, heat intolerance, polydipsia, polyphagia and polyuria.  Genitourinary: Positive for decreased urine volume, dysuria, frequency and urgency. Negative for difficulty urinating, flank pain, hematuria and pelvic pain.  Hematological: Does not bruise/bleed easily.       Objective:   Physical Exam  Constitutional: She appears well-developed and well-nourished. No distress.  HENT:  Head: Normocephalic and atraumatic.  Right Ear: External ear normal.  Left Ear: External ear normal.  Eyes: Pupils are equal, round, and reactive to light. Conjunctivae are normal.  Cardiovascular: Normal rate, regular rhythm, normal heart sounds and intact distal pulses.   No murmur heard. Pulmonary/Chest: Effort normal and breath sounds normal. No respiratory distress. She has no wheezes. She has no rales. She exhibits no tenderness.  Abdominal: Soft. Bowel sounds are normal. She exhibits no distension and no mass. There is tenderness in the right lower quadrant and left lower quadrant. There is no rigidity, no rebound, no guarding, no CVA tenderness, no tenderness at  McBurney's point and negative Murphy's sign.  Skin: Skin is warm and dry. No rash noted. She is not diaphoretic. No erythema. No pallor.  Psychiatric: She has a normal mood and affect. Her behavior is normal. Judgment and thought content normal.  Nursing note and vitals reviewed.         Assessment & Plan:   1. Dysuria   2. Abnormal urinalysis     Dysuria UA +, specimen sent for C/S Cipro 250mg  BID x 3 day Continue to push fluids. Call if sx's persist after ABX completed.  Abnormal urinalysis UA+ for UTI Glu >1000mg /dl, her last A1c was 6.3 in 7/18 She reports diabetic medication compliance. Iva Boop may be causing the glycosuria.    FOLLOW-UP:  Return if symptoms worsen or fail to improve.

## 2017-04-07 NOTE — Assessment & Plan Note (Signed)
UA+ for UTI Glu >1000mg /dl, her last A1c was 6.3 in 7/18 She reports diabetic medication compliance. Iva Boop may be causing the glycosuria.

## 2017-04-11 LAB — CULTURE, URINE COMPREHENSIVE

## 2017-05-11 ENCOUNTER — Encounter: Payer: Self-pay | Admitting: Adult Health

## 2017-05-11 ENCOUNTER — Ambulatory Visit (INDEPENDENT_AMBULATORY_CARE_PROVIDER_SITE_OTHER): Payer: BLUE CROSS/BLUE SHIELD | Admitting: Adult Health

## 2017-05-11 ENCOUNTER — Encounter: Payer: Self-pay | Admitting: Family Medicine

## 2017-05-11 DIAGNOSIS — R197 Diarrhea, unspecified: Secondary | ICD-10-CM | POA: Diagnosis not present

## 2017-05-11 MED ORDER — LOPERAMIDE HCL 2 MG PO CAPS: 2.0000 mg | ORAL_CAPSULE | ORAL | 0 refills | Status: DC | PRN

## 2017-05-11 MED ORDER — ONDANSETRON 8 MG PO TBDP: 8.0000 mg | ORAL_TABLET | Freq: Three times a day (TID) | ORAL | 0 refills | Status: DC | PRN

## 2017-05-11 NOTE — Patient Instructions (Addendum)
Diarrhea, Adult Diarrhea is frequent loose and watery bowel movements. Diarrhea can make you feel weak and cause you to become dehydrated. Dehydration can make you tired and thirsty, cause you to have a dry mouth, and decrease how often you urinate. Diarrhea typically lasts 2-3 days. However, it can last longer if it is a sign of something more serious. It is important to treat your diarrhea as told by your health care provider. Follow these instructions at home: Eating and drinking  Follow these recommendations as told by your health care provider:  Take an oral rehydration solution (ORS). This is a drink that is sold at pharmacies and retail stores.  Drink clear fluids, such as water, ice chips, diluted fruit juice, and low-calorie sports drinks.  Eat bland, easy-to-digest foods in small amounts as you are able. These foods include bananas, applesauce, rice, lean meats, toast, and crackers.  Avoid drinking fluids that contain a lot of sugar or caffeine, such as energy drinks, sports drinks, and soda.  Avoid alcohol.  Avoid spicy or fatty foods.  General instructions  Drink enough fluid to keep your urine clear or pale yellow.  Wash your hands often. If soap and water are not available, use hand sanitizer.  Make sure that all people in your household wash their hands well and often.  Take over-the-counter and prescription medicines only as told by your health care provider.  Rest at home while you recover.  Watch your condition for any changes.  Take a warm bath to relieve any burning or pain from frequent diarrhea episodes.  Keep all follow-up visits as told by your health care provider. This is important. Contact a health care provider if:  You have a fever.  Your diarrhea gets worse.  You have new symptoms.  You cannot keep fluids down.  You feel light-headed or dizzy.  You have a headache  You have muscle cramps. Get help right away if:  You have chest  pain.  You feel extremely weak or you faint.  You have bloody or black stools or stools that look like tar.  You have severe pain, cramping, or bloating in your abdomen.  You have trouble breathing or you are breathing very quickly.  Your heart is beating very quickly.  Your skin feels cold and clammy.  You feel confused.  You have signs of dehydration, such as: ? Dark urine, very little urine, or no urine. ? Cracked lips. ? Dry mouth. ? Sunken eyes. ? Sleepiness. ? Weakness. This information is not intended to replace advice given to you by your health care provider. Make sure you discuss any questions you have with your health care provider. Document Released: 07/24/2002 Document Revised: 12/12/2015 Document Reviewed: 04/09/2015 Elsevier Interactive Patient Education  2017 [place] Diet A bland diet consists of foods that do not have a lot of fat or fiber. Foods without fat or fiber are easier for the body to digest. They are also less likely to irritate your mouth, throat, stomach, and other parts of your gastrointestinal tract. A bland diet is sometimes called a BRAT diet. What is my plan? Your health care provider or dietitian may recommend specific changes to your diet to prevent and treat your symptoms, such as:  Eating small meals often.  Cooking food until it is soft enough to chew easily.  Chewing your food well.  Drinking fluids slowly.  Not eating foods that are very spicy, sour, or fatty.  Not eating citrus fruits, such  as oranges and grapefruit.  What do I need to know about this diet?  Eat a variety of foods from the bland diet food list.  Do not follow a bland diet longer than you have to.  Ask your health care provider whether you should take vitamins. What foods can I eat? Grains  Hot cereals, such as cream of wheat. Bread, crackers, or tortillas made from refined white flour. Rice. Vegetables Canned or cooked vegetables. Mashed  or boiled potatoes. Fruits Bananas. Applesauce. Other types of cooked or canned fruit with the skin and seeds removed, such as canned peaches or pears. Meats and Other Protein Sources Scrambled eggs. Creamy peanut butter or other nut butters. Lean, well-cooked meats, such as chicken or fish. Tofu. Soups or broths. Dairy Low-fat dairy products, such as milk, cottage cheese, or yogurt. Beverages Water. Herbal tea. Apple juice. Sweets and Desserts Pudding. Custard. Fruit gelatin. Ice cream. Fats and Oils Mild salad dressings. Canola or olive oil. The items listed above may not be a complete list of allowed foods or beverages. Contact your dietitian for more options. What foods are not recommended? Foods and ingredients that are often not recommended include:  Spicy foods, such as hot sauce or salsa.  Fried foods.  Sour foods, such as pickled or fermented foods.  Raw vegetables or fruits, especially citrus or berries.  Caffeinated drinks.  Alcohol.  Strongly flavored seasonings or condiments.  The items listed above may not be a complete list of foods and beverages that are not allowed. Contact your dietitian for more information. This information is not intended to replace advice given to you by your health care provider. Make sure you discuss any questions you have with your health care provider. Document Released: 11/25/2015 Document Revised: 01/09/2016 Document Reviewed: 08/15/2014 Elsevier Interactive Patient Education  2018 [employer].  Please take Loperamide and Ondansetron as directed. Continue to stay hydrated and eat BRAT diet. If symptoms as not improving by Thursday please call clinic.  FEEL BETTER!

## 2017-05-11 NOTE — Progress Notes (Signed)
Subjective:    Patient ID: Rebecca Robertson, female    DOB: 02/06/1956, 61 y.o.   MRN: [id number]  HPI:  Rebecca Robertson presents with watery diarrhea that started 05/06/17.  She travelled to [place] 2 weeks ago, then was home for 7 days and the diarrhea began.  She also reports experiencing constipation for 5 days at beginning of trip.  She cannot articulate exactly how many loose stools/day she is currently having, or even estimate. She has been using OTC Imodium with only minor sx relief.  She reports diffuse, aching "knotting" abdominal pain that is rated 6/10.  She reports her BMs as liquid with "particles of food".  She denies fever, night sweats, severe abdominal pain.  She denies blood or mucus in stool/urine.  She reports low grade nausea without vomiting.  She has been able to push fluids and feels that her urine is mostly clear without a strong odor.  She has had poor appetite since "everytime I eat it just comes out". She denies eating anywhere unusual or unsanitary prior to onset of sx's.  She was on 3 day course of Cipro 4 weeks ago for UTI treatment.  She is not on PPI. She has not been exposed to poultry or other farm animals.  She reports a very similar GI event occurring 2015 and she was underwent colonoscopy 04/20/2014, the findings were essentially normal and she believes she was instructed to repeat procedure in 5-10 years. Of Note: Her BP and weight are stable and within her normal trending.  Patient Care Team    Relationship Specialty Notifications Start End  Mellody Dance, DO PCP - General Family Medicine  03/04/16   Teena Irani, MD Consulting Physician Gastroenterology  06/02/16   Delrae Rend, MD Consulting Physician Endocrinology  06/02/16   Druscilla Brownie, MD Consulting Physician Dermatology  06/02/16   Nicholas Lose, MD Consulting Physician Hematology and Oncology  06/02/16   Bjorn Loser, MD Consulting Physician Urology  06/02/16   Christain Sacramento, [place] Referring  Physician Optometry  05/11/17     Patient Active Problem List   Diagnosis Date Noted  . Diarrhea in adult patient 05/11/2017  . UTI (urinary tract infection) 04/07/2017  . Dysuria 04/07/2017  . Abnormal urinalysis 04/07/2017  . Cervical smear, as part of routine gynecological examination 11/09/2016  . Pneumonia 09/23/2016  . 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 Depression 05/28/2016  . B12 deficiency 05/28/2016  . SUI (stress urinary incontinence, female) 05/28/2016  . Hypertriglyceridemia 05/28/2016  . h/o Anxiety 03/08/2016  . Hyperlipidemia 03/04/2016  . Vitamin D insufficiency 03/04/2016  . History of breast cancer 03/04/2016  . Contact dermatitis and eczema due to cause 03/04/2016  . Closed fracture of proximal phalanx of toe 04/03/2014  . Diabetes ([place]) 03/27/2014  . Neoplasm of left breast, primary tumor staging category Tis: ductal carcinoma in situ (DCIS) 02/10/2011     Past Medical History:  Diagnosis Date  . Anxiety   . Diabetes mellitus   . History of breast cancer ONCOLOGIST-- DR Humphrey Rolls--  NO RECURRENCE   S/P LEFT PARTIAL MASTECTOMY W/ SLN BX'S  12-02-2010---  DUCTAL CARCINOMA IN SITU--  S/P RADIATION THERAPY ENDED 02-17-2011  . Hyperlipidemia   . Pneumonia   . PONV (postoperative nausea and vomiting)   . SUI (stress urinary incontinence, female)   . Vitamin D deficiency   . Vitamin D insufficiency 03/04/2016     Past Surgical History:  Procedure Laterality Date  . BLADDER SUSPENSION N/A 02/03/2013   Procedure:  [place] PROCEDURE;  Surgeon: Reece Packer, MD;  Location: [facility];  Service: Urology;  Laterality: N/A;  . BUNIONECTOMY    . CESAREAN SECTION     X4  . PARTIAL MASTECTOMY WITH AXILLARY SENTINEL LYMPH NODE BIOPSY Left 12-02-2010     Family History  Problem Relation Age of Onset  . Cancer Mother        lung  . Heart attack Father   . COPD Father   . Heart disease Father      History  Drug Use No     History   Alcohol Use No     History  Smoking Status  . Never Smoker  Smokeless Tobacco  . Never Used     Outpatient Encounter Prescriptions as of 05/11/2017  Medication Sig Note  . BAYER CONTOUR NEXT TEST test strip Check blood sugar daily 03/04/2016: Received from: External Pharmacy Received Sig:   . citalopram (CELEXA) 20 MG tablet Take 1 tablet (20 mg total) by mouth daily.   . fenofibrate 160 MG tablet TAKE 1 TABLET BY MOUTH DAILY.   Marland Kitchen Fluocinolone Acetonide 0.01 % OIL Apply to skin as needed twice daily for rash.   Marland Kitchen glucosamine-chondroitin 500-400 MG tablet Take 1 tablet by mouth daily. 05/28/2016: Received from: [facility] Received Sig: Take 1 tablet by mouth 3 times daily.  Marland Kitchen lovastatin (MEVACOR) 40 MG tablet TAKE 1 TABLET BY MOUTH DAILY AT 8 PM.   . oxybutynin (DITROPAN) 5 MG tablet TAKE 1 TABLET BY MOUTH 2 TIMES DAILY.   Marland Kitchen OZEMPIC 0.25 or 0.5 MG/DOSE SOPN Inject 0.25 mg into the skin once a week.   Marland Kitchen SYNJARDY 12.12-998 MG TABS  09/10/2015: Received from: External Pharmacy  . [DISCONTINUED] ciprofloxacin (CIPRO) 250 MG tablet Take 1 tablet (250 mg total) by mouth 2 (two) times daily.   Marland Kitchen loperamide (IMODIUM) 2 MG capsule Take 1 capsule (2 mg total) by mouth as needed for diarrhea or loose stools.   . ondansetron (ZOFRAN-ODT) 8 MG disintegrating tablet Take 1 tablet (8 mg total) by mouth every 8 (eight) hours as needed for nausea or vomiting.    No facility-administered encounter medications on file as of 05/11/2017.     Allergies: Compazine and Bydureon [exenatide]  Body mass index is 21.23 kg/m.  Blood pressure 97/60, pulse 67, temperature 98.3 F (36.8 C), temperature source Oral, height 5\' 4"  (1.626 m), weight 123 lb 11.2 oz (56.1 kg).     Review of Systems  Constitutional: Positive for activity change, appetite change and fatigue. Negative for chills, diaphoresis, fever and unexpected weight change.  Respiratory: Negative for cough, chest  tightness, shortness of breath, wheezing and stridor.   Cardiovascular: Negative for chest pain, palpitations and leg swelling.  Gastrointestinal: Positive for abdominal pain, diarrhea and nausea. Negative for abdominal distention, anal bleeding, blood in stool, constipation, rectal pain and vomiting.  Endocrine: Negative for cold intolerance, heat intolerance, polydipsia, polyphagia and polyuria.  Genitourinary: Negative for difficulty urinating, flank pain, hematuria, pelvic pain and urgency.  Skin: Negative for color change, pallor, rash and wound.  Neurological: Negative for tremors, weakness, light-headedness and headaches.  Hematological: Does not bruise/bleed easily.  Psychiatric/Behavioral: The patient is nervous/anxious.        Objective:   Physical Exam  Constitutional: She is oriented to person, place, and time. She appears well-developed and well-nourished.  Non-toxic appearance. She does not have a sickly appearance. She does not appear ill. No distress.  HENT:  Head: Normocephalic and atraumatic.  Right Ear: External ear normal.  Left Ear: External ear normal.  Eyes: Pupils are equal, round, and reactive to light. Conjunctivae are normal.  Cardiovascular: Normal rate, regular rhythm, normal heart sounds and intact distal pulses.   No murmur heard. Pulmonary/Chest: Effort normal and breath sounds normal. No respiratory distress. She has no wheezes. She has no rales. She exhibits no tenderness.  Abdominal: Soft. She exhibits no distension and no mass. Bowel sounds are increased. There is generalized tenderness. There is no rigidity, no rebound, no guarding, no CVA tenderness, no tenderness at McBurney's point and negative Murphy's sign.  She appeared uncomfortable when palpating abdomen, however not in severe pain.  Neurological: She is alert and oriented to person, place, and time.  Skin: Skin is warm and dry. No rash noted. She is not diaphoretic. No erythema. No pallor.   Psychiatric: She has a normal mood and affect. Her behavior is normal. Judgment and thought content normal.  Nursing note and vitals reviewed.         Assessment & Plan:   1. Diarrhea in adult patient     Diarrhea in adult patient Please take Loperamide and Ondansetron as directed. Continue to stay hydrated and eat BRAT diet. If symptoms as not improving by Thursday please call clinic-will run CBC/BMP. She reports a very similar GI event occurring 2015 and she was underwent colonoscopy 04/20/2014, the findings were essentially normal and instructed to repeat procedure in 5-10 years.     FOLLOW-UP:  Return if symptoms worsen or fail to improve.

## 2017-05-11 NOTE — Assessment & Plan Note (Addendum)
Please take Loperamide and Ondansetron as directed. Continue to stay hydrated and eat BRAT diet. If symptoms as not improving by Thursday please call clinic-will run CBC/BMP. She reports a very similar GI event occurring 2015 and she was underwent colonoscopy 04/20/2014, the findings were essentially normal and instructed to repeat procedure in 5-10 years.

## 2017-05-17 ENCOUNTER — Telehealth: Payer: Self-pay | Admitting: Family Medicine

## 2017-05-17 NOTE — Telephone Encounter (Signed)
Advised pt that she would need OV to evaluate and provide specimen for testing.  Discussed with pt the reasons we do not prescribe antibiotics without testing urine.  Pt expressed understanding, stated that she would see how she feels in the morning.  Charyl Bigger, CMA

## 2017-05-17 NOTE — Telephone Encounter (Signed)
Patient states just had a UTI about 1 month & 1/2 ago states provider/ Katy D. gv her Rx/ Copro 250 MG and it worked and pt request refill of that. -- pls call/ home ph# today if possible to fill this request. ---glh

## 2017-05-18 ENCOUNTER — Encounter: Payer: Self-pay | Admitting: Family Medicine

## 2017-05-18 ENCOUNTER — Ambulatory Visit (INDEPENDENT_AMBULATORY_CARE_PROVIDER_SITE_OTHER): Payer: Managed Care, Other (non HMO) | Admitting: Family Medicine

## 2017-05-18 VITALS — BP 108/70 | HR 64 | Ht 64.0 in | Wt 122.6 lb

## 2017-05-18 DIAGNOSIS — Z87440 Personal history of urinary (tract) infections: Secondary | ICD-10-CM | POA: Diagnosis not present

## 2017-05-18 DIAGNOSIS — R300 Dysuria: Secondary | ICD-10-CM | POA: Diagnosis not present

## 2017-05-18 DIAGNOSIS — R350 Frequency of micturition: Secondary | ICD-10-CM | POA: Insufficient documentation

## 2017-05-18 LAB — POCT URINALYSIS DIPSTICK
BILIRUBIN UA: NEGATIVE
Glucose, UA: >=1000
LEUKOCYTES UA: NEGATIVE
NITRITE UA: POSITIVE
PH UA: 5 (ref 5.0–8.0)
Spec Grav, UA: 1.015 (ref 1.010–1.025)
Urobilinogen, UA: 1 E.U./dL

## 2017-05-18 MED ORDER — PHENAZOPYRIDINE HCL 200 MG PO TABS: 200.0000 mg | ORAL_TABLET | Freq: Three times a day (TID) | ORAL | 0 refills | Status: AC

## 2017-05-18 NOTE — Progress Notes (Signed)
Subjective:    HPI: Rebecca Robertson is a 61 y.o. female who presents to [place] at [facility] today for c/o dysuria.  Sx for 24 hrs/ 1 days.  Started yest Am with sx of pressure, burnnig on urination, + freq, + urgency,   C/O: y dysuria y increased frequency y increased urgency n malodorous urination n Nausea n Fever/ Chills n Back Pain n vaginal/penile discharge n prior h/o STI Monogamous currently; partner w/o STI  *NO  ABX usage past 30 d-- Cipro was given her for urinary tract infection by Mina Marble on [phone].  Culture came up positive greater than 100,000 colonies of Klebsiella pneumoniae, sensitive to Cipro.  Patient finished the full 5 days.  Symptoms did resolve after this episode but just came back yesterday.  No problems updated.  Urinalysis    Component Value Date/Time   BILIRUBINUR negative 05/18/2017 1035   PROTEINUR trace 05/18/2017 1035   UROBILINOGEN 1.0 05/18/2017 1035   NITRITE positive 05/18/2017 1035   LEUKOCYTESUR Negative 05/18/2017 1035    Wt Readings from Last 3 Encounters:  05/18/17 122 lb 9.6 oz (55.6 kg)  05/11/17 123 lb 11.2 oz (56.1 kg)  04/07/17 124 lb 9.6 oz (56.5 kg)   BP Readings from Last 3 Encounters:  05/18/17 108/70  05/11/17 97/60  04/07/17 110/66   Pulse Readings from Last 3 Encounters:  05/18/17 64  05/11/17 67  04/07/17 78   BMI Readings from Last 3 Encounters:  05/18/17 21.04 kg/m  05/11/17 21.23 kg/m  04/07/17 21.39 kg/m     Patient Active Problem List   Diagnosis Date Noted  . Diarrhea in adult patient 05/11/2017  . UTI (urinary tract infection) 04/07/2017  . Dysuria 04/07/2017  . Abnormal urinalysis 04/07/2017  . Cervical smear, as part of routine gynecological examination 11/09/2016  . Pneumonia 09/23/2016  . 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 Depression 05/28/2016  . B12 deficiency 05/28/2016  . SUI (stress urinary incontinence, female) 05/28/2016  . Hypertriglyceridemia  05/28/2016  . Vaginal atrophy 03/25/2016  . h/o Anxiety 03/08/2016  . Hyperlipidemia 03/04/2016  . Vitamin D insufficiency 03/04/2016  . History of breast cancer 03/04/2016  . Contact dermatitis and eczema due to cause 03/04/2016  . Closed fracture of proximal phalanx of toe 04/03/2014  . Diabetes ([place]) 03/27/2014  . Neoplasm of left breast, primary tumor staging category Tis: ductal carcinoma in situ (DCIS) 02/10/2011    Past Surgical History:  Procedure Laterality Date  . BLADDER SUSPENSION N/A 02/03/2013   Procedure: [facility] PROCEDURE;  Surgeon: Reece Packer, MD;  Location: [facility];  Service: Urology;  Laterality: N/A;  . BUNIONECTOMY    . CESAREAN SECTION     X4  . PARTIAL MASTECTOMY WITH AXILLARY SENTINEL LYMPH NODE BIOPSY Left 12-02-2010    Family History  Problem Relation Age of Onset  . Cancer Mother        lung  . Heart attack Father   . COPD Father   . Heart disease Father     History  Drug Use No  ,  History  Alcohol Use No  ,  History  Smoking Status  . Never Smoker  Smokeless Tobacco  . Never Used  ,  History  Sexual Activity  . Sexual activity: Yes    Patient's Medications  New Prescriptions   No medications on file  Previous Medications   BAYER CONTOUR NEXT TEST TEST STRIP    Check blood sugar  daily   CITALOPRAM (CELEXA) 20 MG TABLET    Take 1 tablet (20 mg total) by mouth daily.   FENOFIBRATE 160 MG TABLET    TAKE 1 TABLET BY MOUTH DAILY.   FLUOCINOLONE ACETONIDE 0.01 % OIL    Apply to skin as needed twice daily for rash.   GLUCOSAMINE-CHONDROITIN 500-400 MG TABLET    Take 1 tablet by mouth daily.   LOPERAMIDE (IMODIUM) 2 MG CAPSULE    Take 1 capsule (2 mg total) by mouth as needed for diarrhea or loose stools.   LOVASTATIN (MEVACOR) 40 MG TABLET    TAKE 1 TABLET BY MOUTH DAILY AT 8 PM.   OXYBUTYNIN (DITROPAN) 5 MG TABLET    TAKE 1 TABLET BY MOUTH 2 TIMES DAILY.   OZEMPIC 0.25 OR 0.5 MG/DOSE SOPN    Inject 0.25 mg into  the skin once a week.   SYNJARDY 12.12-998 MG TABS      Modified Medications   No medications on file  Discontinued Medications   ONDANSETRON (ZOFRAN-ODT) 8 MG DISINTEGRATING TABLET    Take 1 tablet (8 mg total) by mouth every 8 (eight) hours as needed for nausea or vomiting.    Compazine and Bydureon [exenatide]  Current Meds  Medication Sig  . BAYER CONTOUR NEXT TEST test strip Check blood sugar daily  . citalopram (CELEXA) 20 MG tablet Take 1 tablet (20 mg total) by mouth daily.  . fenofibrate 160 MG tablet TAKE 1 TABLET BY MOUTH DAILY.  Marland Kitchen Fluocinolone Acetonide 0.01 % OIL Apply to skin as needed twice daily for rash.  Marland Kitchen glucosamine-chondroitin 500-400 MG tablet Take 1 tablet by mouth daily.  Marland Kitchen loperamide (IMODIUM) 2 MG capsule Take 1 capsule (2 mg total) by mouth as needed for diarrhea or loose stools.  . lovastatin (MEVACOR) 40 MG tablet TAKE 1 TABLET BY MOUTH DAILY AT 8 PM.  . oxybutynin (DITROPAN) 5 MG tablet TAKE 1 TABLET BY MOUTH 2 TIMES DAILY.  Marland Kitchen OZEMPIC 0.25 or 0.5 MG/DOSE SOPN Inject 0.25 mg into the skin once a week.  Marland Kitchen SYNJARDY 12.12-998 MG TABS   . [DISCONTINUED] ondansetron (ZOFRAN-ODT) 8 MG disintegrating tablet Take 1 tablet (8 mg total) by mouth every 8 (eight) hours as needed for nausea or vomiting.    Review of Systems: General:   No F/C, wt loss Pulm:   No DIB, pleuritic chest pain Card:  No CP, palpitations Abd:  No n/v/d or pain GU:  Dysuria, increased frequency and urgency; no vaginal discharge Ext:  No inc edema from baseline   Objective:  Blood pressure 108/70, pulse 64, height 5\' 4"  (1.626 m), weight 122 lb 9.6 oz (55.6 kg). Body mass index is 21.04 kg/m.  General: Well Developed, well nourished, and in no acute distress.  HEENT: Normocephalic, atraumatic Skin: Warm and dry, cap RF less 2 sec, good turgor CV: +S1, S2 Respiratory: ECTA B/L; speaking in full sentences, no conversational dyspnea Abd: Soft, NT, ND, No G/R/R, no SPT, No flank  pain NeuroM-Sk: Ambulates w/o assistance, moves * 4 Psych: A and O *3    Impression and Recommendations:    1. Urinary frequency     Urinary frequency - Plan: POCT urinalysis dipstick - We will send for urine culture since patient was just on antibiotics for urinary tract infection 40 days ago. - Only treat with antibiotics if culture is positive this time. - Patient is seen Dr. Vikki Ports of urology in the past for this condition.  Last seen about 2+  years ago.  If culture comes back positive this time, I recommend patient follow-up with urology for further management of these issues. - Treat with extra fluids, Pyridium around-the-clock.  Patient will let us know if symptoms worsen prior to culture results coming back.  The patient was counseled, risk factors were discussed, anticipatory guidance given.  Gross side effects, risk and benefits, and alternatives of medications discussed with patient.  Patient is aware that all medications have potential side effects and we are unable to predict every side effect or drug-drug interaction that may occur.  Expresses verbal understanding and consents to current therapy plan and treatment regimen.   Orders Placed This Encounter  Procedures  . POCT urinalysis dipstick    Discontinued Medications   ONDANSETRON (ZOFRAN-ODT) 8 MG DISINTEGRATING TABLET    Take 1 tablet (8 mg total) by mouth every 8 (eight) hours as needed for nausea or vomiting.      No Follow-up on file.

## 2017-05-18 NOTE — Patient Instructions (Addendum)
Rebecca Robertson, send for urine culture.   We will call you pending the culture results.  If you want to sign up for my chart you can look for these results as well.  Usually cultures take at least 5 days to come back.   Please let us know if your symptoms are worse.  Please take the medicines represcribed for your symptoms and drink extra liquids.

## 2017-05-20 ENCOUNTER — Other Ambulatory Visit: Payer: Self-pay | Admitting: Adult Health

## 2017-05-21 LAB — URINE CULTURE

## 2017-05-24 ENCOUNTER — Telehealth: Payer: Self-pay

## 2017-05-24 ENCOUNTER — Other Ambulatory Visit: Payer: Self-pay

## 2017-05-24 DIAGNOSIS — F32A Depression, unspecified: Secondary | ICD-10-CM

## 2017-05-24 DIAGNOSIS — F419 Anxiety disorder, unspecified: Secondary | ICD-10-CM

## 2017-05-24 DIAGNOSIS — F329 Major depressive disorder, single episode, unspecified: Secondary | ICD-10-CM

## 2017-05-24 NOTE — Telephone Encounter (Signed)
Patient called back.  Patient states that she can not get into her Urologist until 07/06/2017.  Patient states that she is still having symptoms.  Per Dr. Hershal Coria note I informed the patient that from a PCP stand point her recommendation was follow up with Urology.  Patient stated on the phone that she was told at last office visit to call back if symptoms did not improve.  I offered an appointment with Dr Raliegh Scarlet for tomorrow 05/25/2017, patient does not want to have to pay for another office visit for the same symptoms.  Patient she Dr Matilde Sprang, I called [facility] and they are out until late November/December.  Dr Matilde Sprang also see patient once a week at [facility], I called this office and they can get the patient in 06/09/2017 at 9 AM.  Notified the patient and she will take this appointment.  Patient is also on a cancellation list for both offices.  MPulliam, CMA/RT(R)

## 2017-05-24 NOTE — Telephone Encounter (Signed)
Patient's pharmacy sent over refill request for Citalopram, I looked over all notes in the chart and noticed that patient has had acute visits but may be due for follow up for chronic issues.  Sent request to Dr. Raliegh Scarlet for review. MPulliam, CMA/RT(R)

## 2017-05-25 MED ORDER — CITALOPRAM HYDROBROMIDE 20 MG PO TABS: 20.0000 mg | ORAL_TABLET | Freq: Every day | ORAL | 0 refills | Status: DC

## 2017-05-25 NOTE — Telephone Encounter (Signed)
Patient will need an office visit to discuss chronic medical problems as I have not seen her for this in many, many months.  No more refills of Celexa until seen in follow-up

## 2017-05-26 ENCOUNTER — Other Ambulatory Visit: Payer: Self-pay | Admitting: Family Medicine

## 2017-05-26 DIAGNOSIS — Z853 Personal history of malignant neoplasm of breast: Secondary | ICD-10-CM

## 2017-06-02 ENCOUNTER — Encounter: Payer: Managed Care, Other (non HMO) | Admitting: Adult Health

## 2017-06-09 ENCOUNTER — Encounter: Payer: Self-pay | Admitting: Urology

## 2017-06-09 ENCOUNTER — Ambulatory Visit: Payer: Managed Care, Other (non HMO) | Admitting: Urology

## 2017-06-09 VITALS — BP 102/67 | Ht 64.0 in | Wt 122.0 lb

## 2017-06-09 DIAGNOSIS — N390 Urinary tract infection, site not specified: Secondary | ICD-10-CM

## 2017-06-09 LAB — URINALYSIS, COMPLETE
Bilirubin, UA: NEGATIVE
Ketones, UA: NEGATIVE
Leukocytes, UA: NEGATIVE
NITRITE UA: NEGATIVE
Protein, UA: NEGATIVE
RBC, UA: NEGATIVE
Specific Gravity, UA: 1.02 (ref 1.005–1.030)
Urobilinogen, Ur: 0.2 mg/dL (ref 0.2–1.0)
pH, UA: 6 (ref 5.0–7.5)

## 2017-06-09 LAB — MICROSCOPIC EXAMINATION
Bacteria, UA: NONE SEEN
RBC MICROSCOPIC, UA: NONE SEEN /HPF (ref 0–?)

## 2017-06-09 LAB — BLADDER SCAN AMB NON-IMAGING: Scan Result: 12

## 2017-06-09 NOTE — Progress Notes (Signed)
06/09/2017 9:59 AM   Rebecca Robertson 07-15-1956 [id number]  Referring provider: Mellody Dance, [address] [place], [place] [address]  Chief Complaint  Patient presents with  . Recurrent UTI    HPI: The patient has had 2 urinary tract infection since August with cystitis symptoms and burning that responded favorably to antibiotics.  In the last few months she has noticed a little bit of pink when she wipes and pain with intercourse.  I performed a sling on her in [place] possibly in 2015.  She does not have stress incontinence.  She may have a little bit of urge incontinence.  She wears 2 thin pads a day.  She has no bedwetting.  She voids every 2-3 hours and gets up twice at night.  She is on oral hypoglycemics.  The patient has not had a recent renal x-ray but did have a positive culture noted  She has no other neurologic issues.  She has loose bowel movements.  She has not had a hysterectomy  She did not go in a study at [facility]  Modifying factors: There are no other modifying factors  Associated signs and symptoms: There are no other associated signs and symptoms Aggravating and relieving factors: There are no other aggravating or relieving factors Severity: Moderate Duration: Persistent   PMH: Past Medical History:  Diagnosis Date  . Anxiety   . Diabetes mellitus   . History of breast cancer ONCOLOGIST-- DR Humphrey Rolls--  NO RECURRENCE   S/P LEFT PARTIAL MASTECTOMY W/ SLN BX'S  12-02-2010---  DUCTAL CARCINOMA IN SITU--  S/P RADIATION THERAPY ENDED 02-17-2011  . Hyperlipidemia   . Pneumonia   . PONV (postoperative nausea and vomiting)   . SUI (stress urinary incontinence, female)   . Vitamin D deficiency   . Vitamin D insufficiency 03/04/2016    Surgical History: Past Surgical History:  Procedure Laterality Date  . BLADDER SUSPENSION N/A 02/03/2013   Procedure: [facility] PROCEDURE;  Surgeon: Reece Packer, MD;  Location: [facility];  Service: Urology;  Laterality: N/A;  . BUNIONECTOMY    . CESAREAN SECTION     X4  . PARTIAL MASTECTOMY WITH AXILLARY SENTINEL LYMPH NODE BIOPSY Left 12-02-2010    Home Medications:  Allergies as of 06/09/2017      Reactions   Compazine Other (See Comments)   Possible seizure, unable to talk, eyes rolled toward back of head, mouth became dry.   Bydureon [exenatide]    Severe burning with injection      Medication List       Accurate as of 06/09/17  9:59 AM. Always use your most recent med list.          BAYER CONTOUR NEXT TEST test strip Generic drug:  glucose blood Check blood sugar daily   citalopram 20 MG tablet Commonly known as:  CELEXA Take 1 tablet (20 mg total) by mouth daily.   fenofibrate 160 MG tablet   loperamide 2 MG capsule Commonly known as:  IMODIUM Take 1 capsule (2 mg total) by mouth as needed for diarrhea or loose stools.   lovastatin 40 MG tablet Commonly known as:  MEVACOR TAKE 1 TABLET BY MOUTH DAILY AT 8 PM.   oxybutynin 5 MG tablet Commonly known as:  DITROPAN TAKE 1 TABLET BY MOUTH 2 TIMES DAILY.   OZEMPIC 0.25 or 0.5 MG/DOSE Sopn Generic drug:  Semaglutide Inject 0.25 mg into the skin once a week.   SYNJARDY 12.12-998 MG Tabs  Generic drug:  Empagliflozin-Metformin HCl       Allergies:  Allergies  Allergen Reactions  . Compazine Other (See Comments)    Possible seizure, unable to talk, eyes rolled toward back of head, mouth became dry.  [name] [Exenatide]     Severe burning with injection    Family History: Family History  Problem Relation Age of Onset  . Cancer Mother        lung  . Heart attack Father   . COPD Father   . Heart disease Father     Social History:  reports that she has never smoked. She has never used smokeless tobacco. She reports that she does not drink alcohol or use drugs.  ROS: UROLOGY Frequent Urination?: Yes Hard to postpone urination?: Yes Burning/pain with urination?:  Yes Get up at night to urinate?: Yes Leakage of urine?: Yes Urine stream starts and stops?: No Trouble starting stream?: No Do you have to strain to urinate?: No Blood in urine?: No Urinary tract infection?: Yes Sexually transmitted disease?: No Injury to kidneys or bladder?: No Painful intercourse?: Yes Weak stream?: No Currently pregnant?: No Vaginal bleeding?: No Last menstrual period?: n  Gastrointestinal Nausea?: No Vomiting?: No Indigestion/heartburn?: No Diarrhea?: Yes Constipation?: No  Constitutional Fever: No Night sweats?: No Weight loss?: No Fatigue?: No  Skin Skin rash/lesions?: Yes Itching?: Yes  Eyes Blurred vision?: No Double vision?: No  Ears/Nose/Throat Sore throat?: No Sinus problems?: No  Hematologic/Lymphatic Swollen glands?: No Easy bruising?: No  Cardiovascular Leg swelling?: No Chest pain?: No  Respiratory Cough?: No Shortness of breath?: No  Endocrine Excessive thirst?: No  Musculoskeletal Back pain?: No Joint pain?: No  Neurological Headaches?: No Dizziness?: No  Psychologic Depression?: No Anxiety?: No  Physical Exam: BP 102/67 (BP Location: Right Arm, Patient Position: Sitting, Cuff Size: Normal)   Ht 5\' 4"  (1.626 m)   Wt 122 lb (55.3 kg)   BMI 20.94 kg/m   Constitutional:  Alert and oriented, No acute distress. HEENT: [place] AT, moist mucus membranes.  Trachea midline, no masses. Cardiovascular: No clubbing, cyanosis, or edema. Respiratory: Normal respiratory effort, no increased work of breathing. GI: Abdomen is soft, nontender, nondistended, no abdominal masses GU: I did a careful examination of the vagina.  She had levator tenderness and was tender throughout the vagina and does have some issues with vaginal dryness.  I looked very carefully and could not see or feel any sling extrusion.  The bladder neck was well supported and there was no stress incontinence or prolapse.  At first I thought I may have seen a  mild extrusion underneath the urethra but on multiple examinations I felt it was normal and I could not palpate any abnormality.  She almost had petechiae around her cervix. Skin: No rashes, bruises or suspicious lesions. Lymph: No cervical or inguinal adenopathy. Neurologic: Grossly intact, no focal deficits, moving all 4 extremities. Psychiatric: Normal mood and affect.  Laboratory Data: Lab Results  Component Value Date   WBC 7.3 05/28/2016   HGB 13.3 05/28/2016   HCT 41.2 05/28/2016   MCV 90.5 05/28/2016   PLT 322 05/28/2016    Lab Results  Component Value Date   CREATININE 0.52 05/28/2016    No results found for: PSA  No results found for: TESTOSTERONE  Lab Results  Component Value Date   HGBA1C 6.5 03/10/2017    Urinalysis    Component Value Date/Time   BILIRUBINUR negative 05/18/2017 1035   PROTEINUR trace 05/18/2017 1035  UROBILINOGEN 1.0 05/18/2017 1035   NITRITE positive 05/18/2017 1035   LEUKOCYTESUR Negative 05/18/2017 1035    Pertinent Imaging: none  Assessment & Plan: The patient has had 2 bladder infections.  The role of an ultrasound and cystoscopy was discussed.  I would like to have the patient come back with the above tests.  I would then place her on prophylaxis.  I would also recommend local estrogen cream but she would need to be followed by her gynecologist if she stayed on it long-term.  Physical therapy likely will be offered for dyspareunia if estrogen cream does not help.  I sent the urine for culture  1. Recurrent UTI 2.  Pain with intercourse - Urinalysis, Complete   No Follow-up on file.  Reece Packer, MD  [facility] [address] [place], [place] [address] [phone]

## 2017-06-13 LAB — CULTURE, URINE COMPREHENSIVE

## 2017-06-16 ENCOUNTER — Ambulatory Visit
Admission: RE | Admit: 2017-06-16 | Discharge: 2017-06-16 | Disposition: A | Discharge status: Home / Self Care | Payer: Managed Care, Other (non HMO) | Source: Ambulatory Visit | Attending: Urology | Admitting: Urology

## 2017-06-16 DIAGNOSIS — N390 Urinary tract infection, site not specified: Secondary | ICD-10-CM

## 2017-06-16 DIAGNOSIS — Z87440 Personal history of urinary (tract) infections: Secondary | ICD-10-CM | POA: Diagnosis not present

## 2017-06-21 ENCOUNTER — Ambulatory Visit: Payer: Managed Care, Other (non HMO) | Admitting: Urology

## 2017-06-21 ENCOUNTER — Encounter: Payer: Self-pay | Admitting: Urology

## 2017-06-21 VITALS — BP 97/59 | HR 59 | Ht 64.0 in | Wt 119.0 lb

## 2017-06-21 DIAGNOSIS — N390 Urinary tract infection, site not specified: Secondary | ICD-10-CM

## 2017-06-21 LAB — URINALYSIS, COMPLETE
Bilirubin, UA: NEGATIVE
KETONES UA: NEGATIVE
LEUKOCYTES UA: NEGATIVE
NITRITE UA: NEGATIVE
PROTEIN UA: NEGATIVE
RBC UA: NEGATIVE
SPEC GRAV UA: 1.015 (ref 1.005–1.030)
Urobilinogen, Ur: 0.2 mg/dL (ref 0.2–1.0)
pH, UA: 5.5 (ref 5.0–7.5)

## 2017-06-21 MED ORDER — TRIMETHOPRIM 100 MG PO TABS: 100.0000 mg | ORAL_TABLET | Freq: Every day | ORAL | 11 refills | Status: DC

## 2017-06-21 NOTE — Progress Notes (Signed)
06/21/2017 9:06 AM   Rebecca Robertson 1956-04-15 [id number]  Referring provider: Esaw Grandchild, NP [address], [place] [address]  Chief Complaint  Patient presents with  . Follow-up    RUS results    HPI: The patient has had 2 urinary tract infection since August with cystitis symptoms and burning that responded favorably to antibiotics.  In the last few months she has noticed a little bit of pink when she wipes and pain with intercourse.  I performed a sling on her in [place] possibly in 2015.  She does not have stress incontinence.  She may have a little bit of urge incontinence.  She wears 2 thin pads a day.  She has no bedwetting.  She voids every 2-3 hours and gets up twice at night.  She is on oral hypoglycemics.  The patient has not had a recent renal x-ray but did have a positive culture noted  I would then place her on prophylaxis.  I would also recommend local estrogen cream but she would need to be followed by her gynecologist if she stayed on it long-term.  Physical therapy likely will be offered for dyspareunia if estrogen cream does not help.  I sent the urine for culture  Today Renal ultrasound was normal.  Last urine culture was positive.  Frequency stable.  Clinically not infected today  According to the patient the culture was not treated but clinically was not infected today.  I sent another culture and will treat it if it is positive.      PMH: Past Medical History:  Diagnosis Date  . Anxiety   . Diabetes mellitus   . History of breast cancer ONCOLOGIST-- DR Humphrey Rolls--  NO RECURRENCE   S/P LEFT PARTIAL MASTECTOMY W/ SLN BX'S  12-02-2010---  DUCTAL CARCINOMA IN SITU--  S/P RADIATION THERAPY ENDED 02-17-2011  . Hyperlipidemia   . Pneumonia   . PONV (postoperative nausea and vomiting)   . SUI (stress urinary incontinence, female)   . Vitamin D deficiency   . Vitamin D insufficiency 03/04/2016    Surgical History: Past Surgical History:    Procedure Laterality Date  . BUNIONECTOMY    . CESAREAN SECTION     X4  . PARTIAL MASTECTOMY WITH AXILLARY SENTINEL LYMPH NODE BIOPSY Left 12-02-2010    Home Medications:  Allergies as of 06/21/2017      Reactions   Compazine Other (See Comments)   Possible seizure, unable to talk, eyes rolled toward back of head, mouth became dry.   Bydureon [exenatide]    Severe burning with injection      Medication List        Accurate as of 06/21/17  9:06 AM. Always use your most recent med list.          BAYER CONTOUR NEXT TEST test strip Generic drug:  glucose blood Check blood sugar daily   citalopram 20 MG tablet Commonly known as:  CELEXA Take 1 tablet (20 mg total) by mouth daily.   fenofibrate 160 MG tablet   loperamide 2 MG capsule Commonly known as:  IMODIUM Take 1 capsule (2 mg total) by mouth as needed for diarrhea or loose stools.   lovastatin 40 MG tablet Commonly known as:  MEVACOR TAKE 1 TABLET BY MOUTH DAILY AT 8 PM.   oxybutynin 5 MG tablet Commonly known as:  DITROPAN TAKE 1 TABLET BY MOUTH 2 TIMES DAILY.   OZEMPIC 0.25 or 0.5 MG/DOSE Sopn Generic drug:  Semaglutide Inject  0.25 mg into the skin once a week.   SYNJARDY 12.12-998 MG Tabs Generic drug:  Empagliflozin-Metformin HCl       Allergies:  Allergies  Allergen Reactions  . Compazine Other (See Comments)    Possible seizure, unable to talk, eyes rolled toward back of head, mouth became dry.  [name] [Exenatide]     Severe burning with injection    Family History: Family History  Problem Relation Age of Onset  . Cancer Mother        lung  . Heart attack Father   . COPD Father   . Heart disease Father     Social History:  reports that  has never smoked. she has never used smokeless tobacco. She reports that she does not drink alcohol or use drugs.  ROS: UROLOGY Frequent Urination?: No Hard to postpone urination?: No Burning/pain with urination?: No Get up at night to  urinate?: No Leakage of urine?: No Urine stream starts and stops?: No Trouble starting stream?: No Do you have to strain to urinate?: No Blood in urine?: No Urinary tract infection?: Yes Sexually transmitted disease?: No Injury to kidneys or bladder?: No Painful intercourse?: No Weak stream?: No Currently pregnant?: No Vaginal bleeding?: No Last menstrual period?: n  Gastrointestinal Nausea?: No Vomiting?: No Indigestion/heartburn?: No Diarrhea?: No Constipation?: No  Constitutional Fever: No Night sweats?: No Weight loss?: No Fatigue?: No  Skin Skin rash/lesions?: No Itching?: No  Eyes Blurred vision?: No Double vision?: No  Ears/Nose/Throat Sore throat?: No Sinus problems?: No  Hematologic/Lymphatic Swollen glands?: No Easy bruising?: No  Cardiovascular Leg swelling?: No Chest pain?: No  Respiratory Cough?: No Shortness of breath?: No  Endocrine Excessive thirst?: No  Musculoskeletal Back pain?: No Joint pain?: No  Neurological Headaches?: No Dizziness?: No  Psychologic Depression?: No Anxiety?: No  Physical Exam: BP (!) 97/59 (BP Location: Right Arm, Patient Position: Sitting, Cuff Size: Normal)   Pulse (!) 59   Ht 5\' 4"  (1.626 m)   Wt 119 lb (54 kg)   BMI 20.43 kg/m   Constitutional:  Alert and oriented, No acute distress.  Laboratory Data: Lab Results  Component Value Date   WBC 7.3 05/28/2016   HGB 13.3 05/28/2016   HCT 41.2 05/28/2016   MCV 90.5 05/28/2016   PLT 322 05/28/2016    Lab Results  Component Value Date   CREATININE 0.52 05/28/2016    No results found for: PSA  No results found for: TESTOSTERONE  Lab Results  Component Value Date   HGBA1C 6.5 03/10/2017    Urinalysis    Component Value Date/Time   APPEARANCEUR Clear 06/09/2017 0959   GLUCOSEU 3+ (A) 06/09/2017 0959   BILIRUBINUR Negative 06/09/2017 0959   PROTEINUR Negative 06/09/2017 0959   UROBILINOGEN 1.0 05/18/2017 1035   NITRITE Negative  06/09/2017 0959   LEUKOCYTESUR Negative 06/09/2017 0959    Pertinent Imaging: As above  Assessment & Plan: The patient was placed on daily trimethoprim.  Prescription given.  I will performed a cystoscopy for safety reasons in 6 weeks.  She has a history of breast cancer and did not wish to take estrogen cream.  I will call if this culture is positive  There are no diagnoses linked to this encounter.  Return in about 6 weeks (around 08/02/2017).  Reece Packer, MD  [facility] [address] [place], [place] [address] [phone]

## 2017-06-24 LAB — CULTURE, URINE COMPREHENSIVE

## 2017-06-26 ENCOUNTER — Other Ambulatory Visit: Payer: Self-pay | Admitting: Family Medicine

## 2017-06-29 ENCOUNTER — Telehealth: Payer: Self-pay | Admitting: Adult Health

## 2017-06-29 NOTE — Telephone Encounter (Signed)
Patient called states she is upset that provider only sent Rx for 15 pill of the Rx fenofibrate 160 MG tablets( takes 1 daily)-- pt states she understands if OV required before any Rx approved & wishes that would have been communicated to her ---Pharmacy made her pay full price for the 15 pills same as if the Rx had been for 30 pills-- Patient request medical assistant or PCP call her to explain.

## 2017-06-29 NOTE — Telephone Encounter (Signed)
Called patient left message for patient to call the office. MPulliam, CMA/RT(R)  

## 2017-06-30 ENCOUNTER — Telehealth: Payer: Self-pay | Admitting: Adult Health

## 2017-06-30 NOTE — Telephone Encounter (Signed)
Spoke to the patient explain to her about our refill policy as an office, for 30 day and 15 day refills if follow up appointments are needed. Also explained to patient that a chronic follow up was an appointment to discuss medication management, refills, and on going medical conditions. If there is an acute issue such as a recent fall or illness that she can not be see for both and that a decision between and the provider will be made as to which needs to be addressed at that visit. Patient was a little confused on chronic visit and what could be addressed.   Patient also asked if medications could be written for 90 day supply when she comes in for follow up, I advised patient to discuss with the provider, that will be based on her treatment and care plan and the medication.  Patient expressed understanding and call was transferred to [facility] to make her a follow up appointment. MPulliam, CMA/RT(R)

## 2017-07-01 ENCOUNTER — Ambulatory Visit (INDEPENDENT_AMBULATORY_CARE_PROVIDER_SITE_OTHER): Payer: Managed Care, Other (non HMO) | Admitting: Adult Health

## 2017-07-01 ENCOUNTER — Encounter: Payer: Self-pay | Admitting: Adult Health

## 2017-07-01 VITALS — BP 95/55 | HR 60 | Ht 64.0 in | Wt 123.1 lb

## 2017-07-01 DIAGNOSIS — F419 Anxiety disorder, unspecified: Secondary | ICD-10-CM

## 2017-07-01 DIAGNOSIS — F329 Major depressive disorder, single episode, unspecified: Secondary | ICD-10-CM | POA: Diagnosis not present

## 2017-07-01 DIAGNOSIS — E1169 Type 2 diabetes mellitus with other specified complication: Secondary | ICD-10-CM | POA: Diagnosis not present

## 2017-07-01 DIAGNOSIS — E0800 Diabetes mellitus due to underlying condition with hyperosmolarity without nonketotic hyperglycemic-hyperosmolar coma (NKHHC): Secondary | ICD-10-CM

## 2017-07-01 DIAGNOSIS — E119 Type 2 diabetes mellitus without complications: Secondary | ICD-10-CM | POA: Diagnosis not present

## 2017-07-01 DIAGNOSIS — E785 Hyperlipidemia, unspecified: Secondary | ICD-10-CM

## 2017-07-01 DIAGNOSIS — F32A Depression, unspecified: Secondary | ICD-10-CM

## 2017-07-01 DIAGNOSIS — E782 Mixed hyperlipidemia: Secondary | ICD-10-CM | POA: Diagnosis not present

## 2017-07-01 LAB — POCT GLYCOSYLATED HEMOGLOBIN (HGB A1C): Hemoglobin A1C: 5.8

## 2017-07-01 MED ORDER — OXYBUTYNIN CHLORIDE 5 MG PO TABS
5.0000 mg | ORAL_TABLET | Freq: Two times a day (BID) | ORAL | 1 refills | Status: DC
Start: 2017-07-01 — End: 2018-01-07

## 2017-07-01 MED ORDER — LOVASTATIN 40 MG PO TABS: ORAL_TABLET | ORAL | 2 refills | Status: DC

## 2017-07-01 MED ORDER — CITALOPRAM HYDROBROMIDE 20 MG PO TABS: 20.0000 mg | ORAL_TABLET | Freq: Every day | ORAL | 2 refills | Status: DC

## 2017-07-01 MED ORDER — FENOFIBRATE 160 MG PO TABS: ORAL_TABLET | ORAL | 2 refills | Status: DC

## 2017-07-01 NOTE — Patient Instructions (Signed)
Heart-Healthy Eating Plan Many factors influence your heart health, including eating and exercise habits. Heart (coronary) risk increases with abnormal blood fat (lipid) levels. Heart-healthy meal planning includes limiting unhealthy fats, increasing healthy fats, and making other small dietary changes. This includes maintaining a healthy body weight to help keep lipid levels within a normal range. What is my plan? Your health care provider recommends that you:  Get no more than __25__% of the total calories in your daily diet from fat.  Limit your intake of saturated fat to less than ___5___% of your total calories each day.  Limit the amount of cholesterol in your diet to less than __300__ mg per day.  What types of fat should I choose?  Choose healthy fats more often. Choose monounsaturated and polyunsaturated fats, such as olive oil and canola oil, flaxseeds, walnuts, almonds, and seeds.  Eat more omega-3 fats. Good choices include salmon, mackerel, sardines, tuna, flaxseed oil, and ground flaxseeds. Aim to eat fish at least two times each week.  Limit saturated fats. Saturated fats are primarily found in animal products, such as meats, butter, and cream. Plant sources of saturated fats include palm oil, palm kernel oil, and coconut oil.  Avoid foods with partially hydrogenated oils in them. These contain trans fats. Examples of foods that contain trans fats are stick margarine, some tub margarines, cookies, crackers, and other baked goods. What general guidelines do I need to follow?  Check food labels carefully to identify foods with trans fats or high amounts of saturated fat.  Fill one half of your plate with vegetables and green salads. Eat 4-5 servings of vegetables per day. A serving of vegetables equals 1 cup of raw leafy vegetables,  cup of raw or cooked cut-up vegetables, or  cup of vegetable juice.  Fill one fourth of your plate with whole grains. Look for the word "whole"  as the first word in the ingredient list.  Fill one fourth of your plate with lean protein foods.  Eat 4-5 servings of fruit per day. A serving of fruit equals one medium whole fruit,  cup of dried fruit,  cup of fresh, frozen, or canned fruit, or  cup of 100% fruit juice.  Eat more foods that contain soluble fiber. Examples of foods that contain this type of fiber are apples, broccoli, carrots, beans, peas, and barley. Aim to get 20-30 g of fiber per day.  Eat more home-cooked food and less restaurant, buffet, and fast food.  Limit or avoid alcohol.  Limit foods that are high in starch and sugar.  Avoid fried foods.  Cook foods by using methods other than frying. Baking, boiling, grilling, and broiling are all great options. Other fat-reducing suggestions include: ? Removing the skin from poultry. ? Removing all visible fats from meats. ? Skimming the fat off of stews, soups, and gravies before serving them. ? Steaming vegetables in water or broth.  Lose weight if you are overweight. Losing just 5-10% of your initial body weight can help your overall health and prevent diseases such as diabetes and heart disease.  Increase your consumption of nuts, legumes, and seeds to 4-5 servings per week. One serving of dried beans or legumes equals  cup after being cooked, one serving of nuts equals 1 ounces, and one serving of seeds equals  ounce or 1 tablespoon.  You may need to monitor your salt (sodium) intake, especially if you have high blood pressure. Talk with your health care provider or dietitian to get  more information about reducing sodium. What foods can I eat? Grains  Breads, including [place], white, pita, wheat, raisin, rye, oatmeal, and [place]. Tortillas that are neither fried nor made with lard or trans fat. Low-fat rolls, including hotdog and hamburger buns and English muffins. Biscuits. Muffins. Waffles. Pancakes. Light popcorn. Whole-grain cereals. Flatbread. Melba  toast. Pretzels. Breadsticks. Rusks. Low-fat snacks and crackers, including oyster, saltine, matzo, graham, animal, and rye. Rice and pasta, including brown rice and those that are made with whole wheat. Vegetables All vegetables. Fruits All fruits, but limit coconut. Meats and Other Protein Sources Lean, well-trimmed beef, veal, pork, and lamb. Chicken and [place] without skin. All fish and shellfish. Wild duck, rabbit, pheasant, and venison. Egg whites or low-cholesterol egg substitutes. Dried beans, peas, lentils, and tofu.Seeds and most nuts. Dairy Low-fat or nonfat cheeses, including ricotta, string, and mozzarella. Skim or 1% milk that is liquid, powdered, or evaporated. Buttermilk that is made with low-fat milk. Nonfat or low-fat yogurt. Beverages Mineral water. Diet carbonated beverages. Sweets and Desserts Sherbets and fruit ices. Honey, jam, marmalade, jelly, and syrups. Meringues and gelatins. Pure sugar candy, such as hard candy, jelly beans, gumdrops, mints, marshmallows, and small amounts of dark chocolate. [employer] Eat all sweets and desserts in moderation. Fats and Oils Nonhydrogenated (trans-free) margarines. Vegetable oils, including soybean, sesame, sunflower, olive, peanut, safflower, corn, canola, and cottonseed. Salad dressings or mayonnaise that are made with a vegetable oil. Limit added fats and oils that you use for cooking, baking, salads, and as spreads. Other Cocoa powder. Coffee and tea. All seasonings and condiments. The items listed above may not be a complete list of recommended foods or beverages. Contact your dietitian for more options. What foods are not recommended? Grains Breads that are made with saturated or trans fats, oils, or whole milk. Croissants. Butter rolls. Cheese breads. Sweet rolls. Donuts. Buttered popcorn. Chow mein noodles. High-fat crackers, such as cheese or butter crackers. Meats and Other Protein Sources Fatty meats, such as  hotdogs, short ribs, sausage, spareribs, bacon, ribeye roast or steak, and mutton. High-fat deli meats, such as salami and bologna. Caviar. Domestic duck and goose. Organ meats, such as kidney, liver, sweetbreads, brains, gizzard, chitterlings, and heart. Dairy Cream, sour cream, cream cheese, and creamed cottage cheese. Whole milk cheeses, including blue (bleu), Monterey Jack, [place], [place], American, [place], Swiss, [place], [place], and [place]. Whole or 2% milk that is liquid, evaporated, or condensed. Whole buttermilk. Cream sauce or high-fat cheese sauce. Yogurt that is made from whole milk. Beverages Regular sodas and drinks with added sugar. Sweets and Desserts Frosting. Pudding. Cookies. Cakes other than angel food cake. Candy that has milk chocolate or white chocolate, hydrogenated fat, butter, coconut, or unknown ingredients. Buttered syrups. Full-fat ice cream or ice cream drinks. Fats and Oils Gravy that has suet, meat fat, or shortening. Cocoa butter, hydrogenated oils, palm oil, coconut oil, palm kernel oil. These can often be found in baked products, candy, fried foods, nondairy creamers, and whipped toppings. Solid fats and shortenings, including bacon fat, salt pork, lard, and butter. Nondairy cream substitutes, such as coffee creamers and sour cream substitutes. Salad dressings that are made of unknown oils, cheese, or sour cream. The items listed above may not be a complete list of foods and beverages to avoid. Contact your dietitian for more information. This information is not intended to replace advice given to you by your health care provider. Make sure you discuss any questions you have with your health care  provider. Document Released: 05/12/2008 Document Revised: 02/21/2016 Document Reviewed: 01/25/2014 Elsevier Interactive Patient Education  2017 [employer].  Please continue on all medications as directed! Increase water intake, strive for at least 60 ounces/day.     Follow Heart Healthy diet Since your blood pressure and A1c is so GREAT, please continue everything that you are doing and follow-up in 5 months for complete physical and labs. Please schedule nurse visit for fasting labs in the next week. GREAT TO SEE YOU!

## 2017-07-01 NOTE — Assessment & Plan Note (Signed)
Aic 5.8- GREAT! T2D managed by Dr. Buddy Duty with annual visits since her blood sugar is so well controlled!

## 2017-07-01 NOTE — Assessment & Plan Note (Signed)
Well controlled on citalopram 20mg  daily

## 2017-07-01 NOTE — Progress Notes (Signed)
Subjective:    Patient ID: Rebecca Robertson, female    DOB: May 22, 1956, 61 y.o.   MRN: [id number]  HPI:  11/09/2016 OV Rebecca Robertson presents for PAP and wellness check.  She reports that the "burning mouth syndrome" has completely resolved.  She denies CP/dyspnea/dizziness/HA/neuropathy/palpitations.  07/01/17 OV: Rebecca Robertson is here for f/u: HL, T2D, Anxiety.  She has been seen several times for acute issues, however has not followed up for chronic care and required medication refills today.  She reports medication compliance and denies SE.   Patient Care Team    Relationship Specialty Notifications Start End  [place], Valetta Fuller D, NP PCP - General Family Medicine  06/01/17   Teena Irani, MD Consulting Physician Gastroenterology  06/02/16   Delrae Rend, MD Consulting Physician Endocrinology  06/02/16   Druscilla Brownie, MD Consulting Physician Dermatology  06/02/16   Nicholas Lose, MD Consulting Physician Hematology and Oncology  06/02/16   Bjorn Loser, MD Consulting Physician Urology  06/02/16   Christain [place], [place] Referring Physician Optometry  05/11/17     Patient Active Problem List   Diagnosis Date Noted  . Personal history UTI- 04/07/17, + Cx- txed with abx 05/18/2017  . Urinary frequency 05/18/2017  . Diarrhea in adult patient 05/11/2017  . UTI (urinary tract infection) 04/07/2017  . Dysuria 04/07/2017  . Abnormal urinalysis 04/07/2017  . Cervical smear, as part of routine gynecological examination 11/09/2016  . Pneumonia 09/23/2016  . Depression 05/28/2016  . B12 deficiency 05/28/2016  . SUI (stress urinary incontinence, female) 05/28/2016  . Hypertriglyceridemia 05/28/2016  . Vaginal atrophy 03/25/2016  . h/o Anxiety 03/08/2016  . Hyperlipidemia 03/04/2016  . Vitamin D insufficiency 03/04/2016  . History of breast cancer 03/04/2016  . Contact dermatitis and eczema due to cause 03/04/2016  . Closed fracture of proximal phalanx of toe 04/03/2014  . Diabetes ([place])  03/27/2014  . Neoplasm of left breast, primary tumor staging category Tis: ductal carcinoma in situ (DCIS) 02/10/2011     Past Medical History:  Diagnosis Date  . Anxiety   . Diabetes mellitus   . History of breast cancer ONCOLOGIST-- DR Humphrey Rolls--  NO RECURRENCE   S/P LEFT PARTIAL MASTECTOMY W/ SLN BX'S  12-02-2010---  DUCTAL CARCINOMA IN SITU--  S/P RADIATION THERAPY ENDED 02-17-2011  . Hyperlipidemia   . Pneumonia   . PONV (postoperative nausea and vomiting)   . SUI (stress urinary incontinence, female)   . Vitamin D deficiency   . Vitamin D insufficiency 03/04/2016     Past Surgical History:  Procedure Laterality Date  . BLADDER SUSPENSION N/A 02/03/2013   Procedure: [facility] PROCEDURE;  Surgeon: Reece Packer, MD;  Location: [facility];  Service: Urology;  Laterality: N/A;  . BUNIONECTOMY    . CESAREAN SECTION     X4  . PARTIAL MASTECTOMY WITH AXILLARY SENTINEL LYMPH NODE BIOPSY Left 12-02-2010     Family History  Problem Relation Age of Onset  . Cancer Mother        lung  . Heart attack Father   . COPD Father   . Heart disease Father      Social History   Substance and Sexual Activity  Drug Use No     Social History   Substance and Sexual Activity  Alcohol Use No     Social History   Tobacco Use  Smoking Status Never Smoker  Smokeless Tobacco Never Used     Outpatient Encounter Medications as of 07/01/2017  Medication Sig Note  .  BAYER CONTOUR NEXT TEST test strip Check blood sugar daily 03/04/2016: Received from: External Pharmacy Received Sig:   . citalopram (CELEXA) 20 MG tablet Take 1 tablet (20 mg total) daily by mouth.   . fenofibrate 160 MG tablet TAKE 1 TABLET BY MOUTH DAILY. PATIENT MUST HAVE OFFICE VISIT PRIOR TO ANY FURTHER REFILLS   . glucosamine-chondroitin 500-400 MG tablet Take 1 tablet daily by mouth.   . loperamide (IMODIUM) 2 MG capsule Take 1 capsule (2 mg total) by mouth as needed for diarrhea or loose stools.    . lovastatin (MEVACOR) 40 MG tablet TAKE 1 TABLET BY MOUTH DAILY AT 8 PM.   . oxybutynin (DITROPAN) 5 MG tablet Take 1 tablet (5 mg total) 2 (two) times daily by mouth.   Marland Kitchen OZEMPIC 0.25 or 0.5 MG/DOSE SOPN Inject 0.25 mg into the skin once a week.   Marland Kitchen SYNJARDY 12.12-998 MG TABS Take 1 tablet 2 (two) times daily by mouth.  09/10/2015: Received from: External Pharmacy  . [DISCONTINUED] citalopram (CELEXA) 20 MG tablet Take 1 tablet (20 mg total) by mouth daily.   . [DISCONTINUED] fenofibrate 160 MG tablet    . [DISCONTINUED] fenofibrate 160 MG tablet TAKE 1 TABLET BY MOUTH DAILY. PATIENT MUST HAVE OFFICE VISIT PRIOR TO ANY FURTHER REFILLS   . [DISCONTINUED] lovastatin (MEVACOR) 40 MG tablet TAKE 1 TABLET BY MOUTH DAILY AT 8 PM.   . [DISCONTINUED] oxybutynin (DITROPAN) 5 MG tablet TAKE 1 TABLET BY MOUTH 2 TIMES DAILY.   . [DISCONTINUED] trimethoprim (TRIMPEX) 100 MG tablet Take 1 tablet (100 mg total) daily by mouth.    No facility-administered encounter medications on file as of 07/01/2017.     Allergies: Compazine and Bydureon [exenatide]  Body mass index is 21.13 kg/m.  Blood pressure (!) 95/55, pulse 60, height 5\' 4"  (1.626 m), weight 123 lb 1.6 oz (55.8 kg).     Review of Systems  Constitutional: Negative for activity change, appetite change, chills, diaphoresis, fatigue, fever and unexpected weight change.  HENT: Negative for congestion.   Eyes: Negative for visual disturbance.  Respiratory: Negative for cough, chest tightness, shortness of breath, wheezing and stridor.   Cardiovascular: Negative for chest pain and leg swelling.  Gastrointestinal: Negative for abdominal distention, abdominal pain, blood in stool, constipation, diarrhea, nausea and rectal pain.  Endocrine: Negative for cold intolerance, heat intolerance, polydipsia, polyphagia and polyuria.  Genitourinary: Negative for difficulty urinating, flank pain and hematuria.       Vaginal dryness and dyspareunia-followed  by GYN for these issues.  Skin: Negative for color change, pallor, rash and wound.  Allergic/Immunologic: Negative for immunocompromised state.  Neurological: Negative for tremors, facial asymmetry, weakness, light-headedness and headaches.       Objective:   Physical Exam  Constitutional: She is oriented to person, place, and time. She appears well-developed and well-nourished. No distress.  HENT:  Head: Normocephalic and atraumatic.  Eyes: Conjunctivae are normal. Pupils are equal, round, and reactive to light.  Neck: Normal range of motion.  Cardiovascular: Normal rate, regular rhythm, normal heart sounds and intact distal pulses.  No murmur heard. Pulmonary/Chest: Effort normal and breath sounds normal. No respiratory distress. She has no wheezes. She has no rales. She exhibits no tenderness.  Abdominal: Soft. Bowel sounds are normal. She exhibits no distension and no mass. There is no tenderness. There is no rebound and no guarding. Hernia confirmed negative in the right inguinal area and confirmed negative in the left inguinal area.  Genitourinary: No breast swelling,  tenderness, discharge or bleeding. Pelvic exam was performed with patient supine. No tenderness or bleeding in the vagina.  Lymphadenopathy:    She has no cervical adenopathy.       Right: No inguinal adenopathy present.       Left: No inguinal adenopathy present.  Neurological: She is alert and oriented to person, place, and time.  Skin: Skin is warm and dry. No rash noted. She is not diaphoretic. No erythema. No pallor.  Psychiatric: She has a normal mood and affect. Her behavior is normal. Judgment and thought content normal.  Nursing note and vitals reviewed.         Assessment & Plan:   1. Diabetes mellitus without complication ([place])   2. Hyperlipidemia associated with type 2 diabetes mellitus ([place])   3. h/o Anxiety   4. Depression, unspecified depression type   5. Diabetes mellitus due to underlying  condition with hyperosmolarity without coma, without long-term current use of insulin ([place])   6. Mixed hyperlipidemia     Diabetes ([facility]) Aic 5.8- GREAT! T2D managed by Dr. Buddy Duty with annual visits since her blood sugar is so well controlled!  Depression Well controlled on citalopram 20mg  daily  Hyperlipidemia Fasting lipids to be checked tomorrow Currently on lovastatin 40mg  and fenofibrate 160mg  daily  FOLLOW-UP:  Return in about 5 months (around 11/29/2017) for CPE.

## 2017-07-01 NOTE — Assessment & Plan Note (Signed)
Fasting lipids to be checked tomorrow Currently on lovastatin 40mg  and fenofibrate 160mg  daily

## 2017-07-02 ENCOUNTER — Other Ambulatory Visit: Payer: Managed Care, Other (non HMO)

## 2017-07-02 DIAGNOSIS — E1169 Type 2 diabetes mellitus with other specified complication: Secondary | ICD-10-CM

## 2017-07-02 DIAGNOSIS — E785 Hyperlipidemia, unspecified: Principal | ICD-10-CM

## 2017-07-03 LAB — LIPID PANEL
CHOLESTEROL TOTAL: 130 mg/dL (ref 100–199)
Chol/HDL Ratio: 2.8 ratio (ref 0.0–4.4)
HDL: 47 mg/dL (ref >39)
LDL CALC: 69 mg/dL (ref 0–99)
Triglycerides: 69 mg/dL (ref 0–149)
VLDL Cholesterol Cal: 14 mg/dL (ref 5–40)

## 2017-07-07 ENCOUNTER — Telehealth: Payer: Self-pay

## 2017-07-07 MED ORDER — NITROFURANTOIN MACROCRYSTAL 100 MG PO CAPS: 100.0000 mg | ORAL_CAPSULE | Freq: Two times a day (BID) | ORAL | 0 refills | Status: DC

## 2017-07-07 NOTE — Telephone Encounter (Signed)
Bjorn Loser, MD  Toniann Fail C, LPN        Macrodantin 100 mg twice a day for 1 week; then go back on daily prophylaxis    LMOM- medication sent to pharmacy.

## 2017-07-12 NOTE — Telephone Encounter (Signed)
Spoke with pt in reference to macrodantin. Pt stated that she will pick up medication today. Pt stated she has been taking her daily trimethoprim. Pt denied worsening s/s or spiking a fever. Pt voiced understanding of whole conversation.

## 2017-07-18 ENCOUNTER — Ambulatory Visit (HOSPITAL_COMMUNITY)
Admission: EM | Admit: 2017-07-18 | Discharge: 2017-07-18 | Disposition: A | Discharge status: Home / Self Care | Payer: Managed Care, Other (non HMO)

## 2017-07-18 ENCOUNTER — Other Ambulatory Visit: Payer: Self-pay

## 2017-07-18 ENCOUNTER — Encounter (HOSPITAL_COMMUNITY): Payer: Self-pay | Admitting: *Deleted

## 2017-07-18 ENCOUNTER — Ambulatory Visit (INDEPENDENT_AMBULATORY_CARE_PROVIDER_SITE_OTHER): Payer: Managed Care, Other (non HMO)

## 2017-07-18 DIAGNOSIS — S8392XA Sprain of unspecified site of left knee, initial encounter: Secondary | ICD-10-CM

## 2017-07-18 DIAGNOSIS — S93402A Sprain of unspecified ligament of left ankle, initial encounter: Secondary | ICD-10-CM

## 2017-07-18 MED ORDER — IBUPROFEN 600 MG PO TABS: 600.0000 mg | ORAL_TABLET | Freq: Four times a day (QID) | ORAL | 0 refills | Status: DC | PRN

## 2017-07-18 MED ORDER — IBUPROFEN 800 MG PO TABS
ORAL_TABLET | ORAL | Status: AC
  Filled 2017-07-18: qty 1

## 2017-07-18 MED ORDER — IBUPROFEN 800 MG PO TABS
800.0000 mg | ORAL_TABLET | Freq: Once | ORAL | Status: AC
  Administered 2017-07-18: 800 mg via ORAL

## 2017-07-18 NOTE — ED Triage Notes (Signed)
Reports getting Christmas tree in [place] when she stepped into hole with LLE.  C/O pain from left knee down into foot and inability to bear weight.  CMS intact.

## 2017-07-18 NOTE — ED Notes (Signed)
Pt denied crutches that was ordered for her by provider. Per pt she will not be using them

## 2017-07-18 NOTE — ED Provider Notes (Signed)
[facility]    CSN: [id number] Arrival date & time: 07/18/17  1800     History   Chief Complaint Chief Complaint  Patient presents with  . Leg Injury    HPI Rebecca Robertson is a 61 y.o. female.   61 year-old female, presenting today after a fall. She states that she was picking out her christmas tree and fell into a hole with the left leg. Since that time, she has had left knee, left ankle and left foot pain. Complains of "something ripping" in the back of her calf.  Injury occurred about 2 pm    The history is provided by the patient.  Leg Pain  Location:  Knee, ankle and foot Knee location:  L knee Ankle location:  L ankle Foot location:  [place] of L foot Pain details:    Quality:  Aching   Radiates to:  Does not radiate   Severity:  Moderate   Onset quality:  Sudden   Duration:  5 hours   Timing:  Constant Chronicity:  New Dislocation: no   Foreign body present:  No foreign bodies Tetanus status:  Unknown Prior injury to area:  No Relieved by:  Nothing Worsened by:  Bearing weight Ineffective treatments:  Rest Associated symptoms: decreased ROM and stiffness   Associated symptoms: no back pain, no fatigue, no fever, no itching, no muscle weakness, no neck pain, no numbness, no swelling and no tingling   Risk factors: no concern for non-accidental trauma, no frequent fractures and no known bone disorder     Past Medical History:  Diagnosis Date  . Anxiety   . Diabetes mellitus   . History of breast cancer ONCOLOGIST-- DR Humphrey Rolls--  NO RECURRENCE   S/P LEFT PARTIAL MASTECTOMY W/ SLN BX'S  12-02-2010---  DUCTAL CARCINOMA IN SITU--  S/P RADIATION THERAPY ENDED 02-17-2011  . Hyperlipidemia   . Pneumonia   . PONV (postoperative nausea and vomiting)   . SUI (stress urinary incontinence, female)   . Vitamin D deficiency   . Vitamin D insufficiency 03/04/2016    Patient Active Problem List   Diagnosis Date Noted  . Personal history UTI- 04/07/17, + Cx-  txed with abx 05/18/2017  . Urinary frequency 05/18/2017  . Diarrhea in adult patient 05/11/2017  . UTI (urinary tract infection) 04/07/2017  . Dysuria 04/07/2017  . Abnormal urinalysis 04/07/2017  . Cervical smear, as part of routine gynecological examination 11/09/2016  . Pneumonia 09/23/2016  . Depression 05/28/2016  . B12 deficiency 05/28/2016  . SUI (stress urinary incontinence, female) 05/28/2016  . Hypertriglyceridemia 05/28/2016  . Vaginal atrophy 03/25/2016  . h/o Anxiety 03/08/2016  . Hyperlipidemia 03/04/2016  . Vitamin D insufficiency 03/04/2016  . History of breast cancer 03/04/2016  . Contact dermatitis and eczema due to cause 03/04/2016  . Closed fracture of proximal phalanx of toe 04/03/2014  . Diabetes ([place]) 03/27/2014  . Neoplasm of left breast, primary tumor staging category Tis: ductal carcinoma in situ (DCIS) 02/10/2011    Past Surgical History:  Procedure Laterality Date  . BLADDER SUSPENSION N/A 02/03/2013   Procedure: [facility] PROCEDURE;  Surgeon: Reece Packer, MD;  Location: [facility];  Service: Urology;  Laterality: N/A;  . BUNIONECTOMY    . CESAREAN SECTION     X4  . PARTIAL MASTECTOMY WITH AXILLARY SENTINEL LYMPH NODE BIOPSY Left 12-02-2010    OB History    No data available       Home Medications    Prior  to Admission medications   Medication Sig Start Date End Date Taking? Authorizing Provider  citalopram (CELEXA) 20 MG tablet Take 1 tablet (20 mg total) daily by mouth. 07/01/17  Yes Danford, Katy D, NP  fenofibrate 160 MG tablet TAKE 1 TABLET BY MOUTH DAILY. PATIENT MUST HAVE OFFICE VISIT PRIOR TO ANY FURTHER REFILLS 07/01/17  Yes Danford, Katy D, NP  glucosamine-chondroitin 500-400 MG tablet Take 1 tablet daily by mouth.   Yes [provider]  loperamide (IMODIUM) 2 MG capsule Take 1 capsule (2 mg total) by mouth as needed for diarrhea or loose stools. 05/11/17  Yes Danford, Valetta Fuller D, NP  lovastatin (MEVACOR) 40  MG tablet TAKE 1 TABLET BY MOUTH DAILY AT 8 PM. 07/01/17  Yes Danford, Valetta Fuller D, NP  nitrofurantoin (MACRODANTIN) 100 MG capsule Take 1 capsule (100 mg total) by mouth 2 (two) times daily. 07/07/17  Yes MacDiarmid, Nicki Reaper, MD  oxybutynin (DITROPAN) 5 MG tablet Take 1 tablet (5 mg total) 2 (two) times daily by mouth. 07/01/17  Yes Danford, Katy D, NP  OZEMPIC 0.25 or 0.5 MG/DOSE SOPN Inject 0.25 mg into the skin once a week. 03/01/17  Yes [provider]  SYNJARDY 12.12-998 MG TABS Take 1 tablet 2 (two) times daily by mouth.  08/26/15  Yes [provider]  UNKNOWN TO PATIENT abx for current UTI   Yes [provider]  BAYER CONTOUR NEXT TEST test strip Check blood sugar daily 01/18/16   [provider]  ibuprofen (ADVIL,MOTRIN) 600 MG tablet Take 1 tablet (600 mg total) by mouth every 6 (six) hours as needed. 07/18/17   Leemon Ayala, Belenda Cruise, PA-C    Family History Family History  Problem Relation Age of Onset  . Cancer Mother        lung  . Heart attack Father   . COPD Father   . Heart disease Father     Social History Social History   Tobacco Use  . Smoking status: Never Smoker  . Smokeless tobacco: Never Used  Substance Use Topics  . Alcohol use: No  . Drug use: No     Allergies   Compazine and Bydureon [exenatide]   Review of Systems Review of Systems  Constitutional: Negative for chills, fatigue and fever.  HENT: Negative for ear pain and sore throat.   Eyes: Negative for pain and visual disturbance.  Respiratory: Negative for cough and shortness of breath.   Cardiovascular: Negative for chest pain and palpitations.  Gastrointestinal: Negative for abdominal pain and vomiting.  Genitourinary: Negative for dysuria and hematuria.  Musculoskeletal: Positive for arthralgias (left knee, left ankle and left foot ) and stiffness. Negative for back pain and neck pain.  Skin: Negative for color change, itching and rash.  Neurological: Negative for seizures  and syncope.  All other systems reviewed and are negative.    Physical Exam Triage Vital Signs ED Triage Vitals  Enc Vitals Group     BP 07/18/17 1838 128/68     Pulse Rate 07/18/17 1838 64     Resp 07/18/17 1838 16     Temp 07/18/17 1838 98.7 F (37.1 C)     Temp Source 07/18/17 1838 Oral     SpO2 07/18/17 1838 100 %     Weight --      Height --      Head Circumference --      Peak Flow --      Pain Score 07/18/17 1840 10     Pain Loc --  Pain Edu? --      Excl. in [place]? --    No data found.  Updated Vital Signs BP 128/68   Pulse 64   Temp 98.7 F (37.1 C) (Oral)   Resp 16   SpO2 100%   Visual Acuity Right Eye Distance:   Left Eye Distance:   Bilateral Distance:    Right Eye Near:   Left Eye Near:    Bilateral Near:     Physical Exam  Constitutional: She appears well-developed and well-nourished. No distress.  HENT:  Head: Normocephalic and atraumatic.  Eyes: Conjunctivae are normal.  Neck: Neck supple.  Cardiovascular: Normal rate and regular rhythm.  No murmur heard. Pulmonary/Chest: Effort normal and breath sounds normal. No respiratory distress.  Abdominal: Soft. There is no tenderness.  Musculoskeletal: She exhibits no edema.       Left knee: Tenderness found. Medial joint line and lateral joint line tenderness noted.       Left ankle: Tenderness. Lateral malleolus tenderness found.       Left foot: There is tenderness.  Normal ROM of the left leg.   Neurological: She is alert.  Skin: Skin is warm and dry.  Psychiatric: She has a normal mood and affect.  Nursing note and vitals reviewed.    UC Treatments / Results  Labs (all labs ordered are listed, but only abnormal results are displayed) Labs Reviewed - No data to display  EKG  EKG Interpretation None       Radiology No results found.  Procedures Procedures (including critical care time)  Medications Ordered in UC Medications  ibuprofen (ADVIL,MOTRIN) tablet 800 mg (800  mg Oral Given 07/18/17 1952)     Initial Impression / Assessment and Plan / UC Course  I have reviewed the triage vital signs and the nursing notes.  Pertinent labs & imaging results that were available during my care of the patient were reviewed by me and considered in my medical decision making (see chart for details).     Review images, no acute findings   Final Clinical Impressions(s) / UC Diagnoses   Final diagnoses:  Sprain of left ankle, unspecified ligament, initial encounter  Sprain of left knee, unspecified ligament, initial encounter    ED Discharge Orders        Ordered    ibuprofen (ADVIL,MOTRIN) 600 MG tablet  Every 6 hours PRN     07/18/17 2003       Controlled Substance Prescriptions [place] Controlled Substance Registry consulted? Not Applicable   Oren Section 07/18/17 2009

## 2017-07-26 ENCOUNTER — Other Ambulatory Visit: Payer: Managed Care, Other (non HMO)

## 2017-08-03 ENCOUNTER — Telehealth: Payer: Self-pay | Admitting: Adult Health

## 2017-08-03 NOTE — Telephone Encounter (Signed)
Checking on status of medical records request faxed to Dr. Cindra Eves office on 11/15( No response [id number])--Left message for Edmonia Lynch at [phone] for cb or submission of requested information.  --glh

## 2017-08-23 ENCOUNTER — Ambulatory Visit (INDEPENDENT_AMBULATORY_CARE_PROVIDER_SITE_OTHER): Payer: Managed Care, Other (non HMO) | Admitting: Urology

## 2017-08-23 ENCOUNTER — Encounter: Payer: Self-pay | Admitting: Urology

## 2017-08-23 VITALS — BP 106/64 | HR 69 | Ht 64.0 in | Wt 119.0 lb

## 2017-08-23 DIAGNOSIS — N390 Urinary tract infection, site not specified: Secondary | ICD-10-CM | POA: Diagnosis not present

## 2017-08-23 LAB — MICROSCOPIC EXAMINATION
Epithelial Cells (non renal): NONE SEEN /hpf (ref 0–10)
RBC MICROSCOPIC, UA: NONE SEEN /HPF (ref 0–?)
WBC UA: NONE SEEN /HPF (ref 0–?)

## 2017-08-23 LAB — URINALYSIS, COMPLETE
BILIRUBIN UA: NEGATIVE
KETONES UA: NEGATIVE
Leukocytes, UA: NEGATIVE
NITRITE UA: NEGATIVE
PROTEIN UA: NEGATIVE
RBC UA: NEGATIVE
SPEC GRAV UA: 1.02 (ref 1.005–1.030)
UUROB: 0.2 mg/dL (ref 0.2–1.0)
pH, UA: 6 (ref 5.0–7.5)

## 2017-08-23 MED ORDER — LIDOCAINE HCL 2 % EX GEL
1.0000 "application " | Freq: Once | CUTANEOUS | Status: AC
  Administered 2017-08-23: 1 via URETHRAL

## 2017-08-23 MED ORDER — CIPROFLOXACIN HCL 500 MG PO TABS
500.0000 mg | ORAL_TABLET | Freq: Once | ORAL | Status: AC
  Administered 2017-08-23: 500 mg via ORAL

## 2017-08-23 NOTE — Progress Notes (Signed)
08/23/2017 9:30 AM   Rebecca Robertson Sep 16, 1955 [id number]  Referring provider: Esaw Grandchild, NP [place] [address], [place] [address]  Chief Complaint  Patient presents with  . Cysto    HPI: The patient has had 2 urinary tract infection since August with cystitis symptoms and burning that responded favorably to antibiotics. In the last few months she has noticed a little bit of pink when she wipes and pain with intercourse. I performed a sling on her in [place] possibly in 2015.  She does not have stress incontinence. She may have a little bit of urge incontinence. She wears 2 thin pads a day. She has no bedwetting.  She voids every 2-3 hours and gets up twice at night. She is on oral hypoglycemics.The patient has not had a recent renal x-ray but did have a positive culture noted  I would then place her on prophylaxis. I would also recommend local estrogen cream but she would need to be followed by her gynecologist if she stayed on it long-term. Physical therapy likely will be offered for dyspareunia if estrogen cream does not help. I sent the urine for culture  Renal ultrasound was normal.  Last urine culture was positive.  Frequency stable.  Clinically not infected today  According to the patient the culture was not treated but clinically was not infected today.  I sent another culture and will treat it if it is positive.    Today I placed the patient on trimethoprim.  Because of a history of breast cancer she did not take estrogen.  In October she had a positive culture and in November she had a negative culture.  She is here for cystoscopy  Patient clinically has not had an infection in her pain with intercourse is gone.  Frequency stable.  She had no microscopic hematuria today  On pelvic examination she had mild meatal stenosis.  She does have some mild vaginal narrowing.  I did not feel or see any obvious sling extrusion.  I was not able to insert  the cystoscope because of the mild meatal stenosis.  A 14 French red rubber catheter could be inserted.  I think endoscope following this.      PMH: Past Medical History:  Diagnosis Date  . Anxiety   . Diabetes mellitus   . History of breast cancer ONCOLOGIST-- DR Humphrey Rolls--  NO RECURRENCE   S/P LEFT PARTIAL MASTECTOMY W/ SLN BX'S  12-02-2010---  DUCTAL CARCINOMA IN SITU--  S/P RADIATION THERAPY ENDED 02-17-2011  . Hyperlipidemia   . Pneumonia   . PONV (postoperative nausea and vomiting)   . SUI (stress urinary incontinence, female)   . Vitamin D deficiency   . Vitamin D insufficiency 03/04/2016    Surgical History: Past Surgical History:  Procedure Laterality Date  . BLADDER SUSPENSION N/A 02/03/2013   Procedure: [facility] PROCEDURE;  Surgeon: Reece Packer, MD;  Location: [facility];  Service: Urology;  Laterality: N/A;  . BUNIONECTOMY    . CESAREAN SECTION     X4  . PARTIAL MASTECTOMY WITH AXILLARY SENTINEL LYMPH NODE BIOPSY Left 12-02-2010    Home Medications:  Allergies as of 08/23/2017      Reactions   Compazine Other (See Comments)   Possible seizure, unable to talk, eyes rolled toward back of head, mouth became dry.   Bydureon [exenatide]    Severe burning with injection      Medication List        Accurate as  of 08/23/17  9:30 AM. Always use your most recent med list.          BAYER CONTOUR NEXT TEST test strip Generic drug:  glucose blood Check blood sugar daily   citalopram 20 MG tablet Commonly known as:  CELEXA Take 1 tablet (20 mg total) daily by mouth.   fenofibrate 160 MG tablet TAKE 1 TABLET BY MOUTH DAILY. PATIENT MUST HAVE OFFICE VISIT PRIOR TO ANY FURTHER REFILLS   glucosamine-chondroitin 500-400 MG tablet Take 1 tablet daily by mouth.   ibuprofen 600 MG tablet Commonly known as:  ADVIL,MOTRIN Take 1 tablet (600 mg total) by mouth every 6 (six) hours as needed.   loperamide 2 MG capsule Commonly known as:  IMODIUM Take 1  capsule (2 mg total) by mouth as needed for diarrhea or loose stools.   lovastatin 40 MG tablet Commonly known as:  MEVACOR TAKE 1 TABLET BY MOUTH DAILY AT 8 PM.   nitrofurantoin 100 MG capsule Commonly known as:  MACRODANTIN Take 1 capsule (100 mg total) by mouth 2 (two) times daily.   oxybutynin 5 MG tablet Commonly known as:  DITROPAN Take 1 tablet (5 mg total) 2 (two) times daily by mouth.   OZEMPIC 0.25 or 0.5 MG/DOSE Sopn Generic drug:  Semaglutide Inject 0.25 mg into the skin once a week.   SYNJARDY 12.12-998 MG Tabs Generic drug:  Empagliflozin-Metformin HCl Take 1 tablet 2 (two) times daily by mouth.   trimethoprim 100 MG tablet Commonly known as:  TRIMPEX   UNKNOWN TO PATIENT abx for current UTI       Allergies:  Allergies  Allergen Reactions  . Compazine Other (See Comments)    Possible seizure, unable to talk, eyes rolled toward back of head, mouth became dry.  [name] [Exenatide]     Severe burning with injection    Family History: Family History  Problem Relation Age of Onset  . Cancer Mother        lung  . Heart attack Father   . COPD Father   . Heart disease Father     Social History:  reports that  has never smoked. she has never used smokeless tobacco. She reports that she does not drink alcohol or use drugs.  ROS:                                        Physical Exam: BP 106/64   Pulse 69   Ht 5\' 4"  (1.626 m)   Wt 54 kg (119 lb)   BMI 20.43 kg/m     Laboratory Data: Lab Results  Component Value Date   WBC 7.3 05/28/2016   HGB 13.3 05/28/2016   HCT 41.2 05/28/2016   MCV 90.5 05/28/2016   PLT 322 05/28/2016    Lab Results  Component Value Date   CREATININE 0.52 05/28/2016    No results found for: PSA  No results found for: TESTOSTERONE  Lab Results  Component Value Date   HGBA1C 5.8 07/01/2017    Urinalysis    Component Value Date/Time   APPEARANCEUR Clear 06/21/2017 0914   GLUCOSEU  3+ (A) 06/21/2017 0914   BILIRUBINUR Negative 06/21/2017 0914   PROTEINUR Negative 06/21/2017 0914   UROBILINOGEN 1.0 05/18/2017 1035   NITRITE Negative 06/21/2017 0914   LEUKOCYTESUR Negative 06/21/2017 0914    Pertinent Imaging: None  Assessment & Plan: I did not want  to dilate the patient's urethra in the face of a sling.  She understands that theoretically in rare cases one can have urethral or bladder erosion from sling causing infections.  Clinically she is doing great on trimethoprim.  She is not infected today clinically and there was no blood in her urine.  I did not want to force the issue to perform cystoscopy.  I would like to see her in 6 months on daily trimethoprim  1. Recurrent UTI 2.  Urinary frequency - Urinalysis, Complete - ciprofloxacin (CIPRO) tablet 500 mg - lidocaine (XYLOCAINE) 2 % jelly 1 application   No Follow-up on file.  Reece Packer, MD  [facility] [address], [place], [place] [address] [phone]

## 2017-11-03 LAB — LIPID PANEL
Cholesterol: 132 (ref 0–200)
HDL: 49 (ref 35–70)
LDL CALC: 69
TRIGLYCERIDES: 69 (ref 40–160)

## 2017-11-03 LAB — HEMOGLOBIN A1C
HEMOGLOBIN A1C: 6.6
HEMOGLOBIN A1C: 6.6

## 2017-11-09 NOTE — Progress Notes (Signed)
Subjective:    Patient ID: Rebecca Robertson, female    DOB: 1956-06-07, 62 y.o.   MRN: [id number]  HPI:  Ms. Falor presents for CPE. She reports medication compliance, denies SE. She was seen by Endo/Dr. Buddy Duty a few weeks ago, A1c 6.6, AM BS <120 consistently and denies episodes of hypoglycemia  She continues to drink plenty of water, follows heart healthy diet, and denies tobacco/ETOH use She has mammogram scheduled at end of April 2019. She denies acute complaints/issues today.  Healthcare Maintenance: PAP-completed today Mammogram- scheduled at end of April 2019 Colonoscopy-due 2025  Patient Care Team    Relationship Specialty Notifications Start End  Mina Marble D, NP PCP - General Family Medicine  06/01/17   Teena Irani, MD (Inactive) Consulting Physician Gastroenterology  06/02/16   Delrae Rend, MD Consulting Physician Endocrinology  06/02/16   Druscilla Brownie, MD Consulting Physician Dermatology  06/02/16   Nicholas Lose, MD Consulting Physician Hematology and Oncology  06/02/16   Bjorn Loser, MD Consulting Physician Urology  06/02/16   Christain Sacramento, [place] Referring Physician Optometry  05/11/17     Patient Active Problem List   Diagnosis Date Noted  . Healthcare maintenance 11/11/2017  . Screening for cervical cancer 11/11/2017  . Personal history UTI- 04/07/17, + Cx- txed with abx 05/18/2017  . Urinary frequency 05/18/2017  . Diarrhea in adult patient 05/11/2017  . UTI (urinary tract infection) 04/07/2017  . Dysuria 04/07/2017  . Abnormal urinalysis 04/07/2017  . Cervical smear, as part of routine gynecological examination 11/09/2016  . Pneumonia 09/23/2016  . Depression 05/28/2016  . B12 deficiency 05/28/2016  . SUI (stress urinary incontinence, female) 05/28/2016  . Hypertriglyceridemia 05/28/2016  . Vaginal atrophy 03/25/2016  . h/o Anxiety 03/08/2016  . Hyperlipidemia 03/04/2016  . Vitamin D insufficiency 03/04/2016  . History of breast cancer  03/04/2016  . Contact dermatitis and eczema due to cause 03/04/2016  . Closed fracture of proximal phalanx of toe 04/03/2014  . Diabetes ([place]) 03/27/2014  . Neoplasm of left breast, primary tumor staging category Tis: ductal carcinoma in situ (DCIS) 02/10/2011     Past Medical History:  Diagnosis Date  . Anxiety   . Diabetes mellitus   . History of breast cancer ONCOLOGIST-- DR Humphrey Rolls--  NO RECURRENCE   S/P LEFT PARTIAL MASTECTOMY W/ SLN BX'S  12-02-2010---  DUCTAL CARCINOMA IN SITU--  S/P RADIATION THERAPY ENDED 02-17-2011  . Hyperlipidemia   . Pneumonia   . PONV (postoperative nausea and vomiting)   . SUI (stress urinary incontinence, female)   . Vitamin D deficiency   . Vitamin D insufficiency 03/04/2016     Past Surgical History:  Procedure Laterality Date  . BLADDER SUSPENSION N/A 02/03/2013   Procedure: [facility] PROCEDURE;  Surgeon: Reece Packer, MD;  Location: [facility];  Service: Urology;  Laterality: N/A;  . BUNIONECTOMY    . CESAREAN SECTION     X4  . MENISCUS REPAIR Left   . PARTIAL MASTECTOMY WITH AXILLARY SENTINEL LYMPH NODE BIOPSY Left 12-02-2010     Family History  Problem Relation Age of Onset  . Cancer Mother        lung  . Heart attack Father   . COPD Father   . Heart disease Father      Social History   Substance and Sexual Activity  Drug Use No     Social History   Substance and Sexual Activity  Alcohol Use No     Social History  Tobacco Use  Smoking Status Never Smoker  Smokeless Tobacco Never Used     Outpatient Encounter Medications as of 11/11/2017  Medication Sig Note  . BAYER CONTOUR NEXT TEST test strip Check blood sugar daily 03/04/2016: Received from: External Pharmacy Received Sig:   . citalopram (CELEXA) 20 MG tablet Take 1 tablet (20 mg total) daily by mouth.   . fenofibrate 160 MG tablet TAKE 1 TABLET BY MOUTH DAILY. PATIENT MUST HAVE OFFICE VISIT PRIOR TO ANY FURTHER REFILLS   .  glucosamine-chondroitin 500-400 MG tablet Take 1 tablet daily by mouth.   Marland Kitchen ibuprofen (ADVIL,MOTRIN) 600 MG tablet Take 1 tablet (600 mg total) by mouth every 6 (six) hours as needed.   . loperamide (IMODIUM) 2 MG capsule Take 1 capsule (2 mg total) by mouth as needed for diarrhea or loose stools.   . lovastatin (MEVACOR) 40 MG tablet TAKE 1 TABLET BY MOUTH DAILY AT 8 PM.   . nitrofurantoin (MACRODANTIN) 100 MG capsule Take 1 capsule (100 mg total) by mouth 2 (two) times daily.   Marland Kitchen oxybutynin (DITROPAN) 5 MG tablet Take 1 tablet (5 mg total) 2 (two) times daily by mouth.   Marland Kitchen OZEMPIC 0.25 or 0.5 MG/DOSE SOPN Inject 0.25 mg into the skin once a week.   Marland Kitchen SYNJARDY 12.12-998 MG TABS Take 1 tablet 2 (two) times daily by mouth.  09/10/2015: Received from: External Pharmacy  . trimethoprim (TRIMPEX) 100 MG tablet    . UNKNOWN TO PATIENT abx for current UTI    No facility-administered encounter medications on file as of 11/11/2017.     Allergies: Compazine and Bydureon [exenatide]  Body mass index is 21.4 kg/m.  Blood pressure (!) 88/51, pulse (!) 57, height 5\' 4"  (1.626 m), weight 124 lb 11.2 oz (56.6 kg), SpO2 97 %.  Her BP normally runs on lower side, she denies dizziness/fatigue   Review of Systems  Constitutional: Positive for fatigue. Negative for activity change, appetite change, chills, diaphoresis, fever and unexpected weight change.  HENT: Positive for congestion.   Eyes: Negative for visual disturbance.  Respiratory: Negative for cough, chest tightness, shortness of breath, wheezing and stridor.   Cardiovascular: Negative for chest pain, palpitations and leg swelling.  Gastrointestinal: Negative for abdominal distention, abdominal pain, blood in stool, constipation, diarrhea, nausea and vomiting.       She reports intermittent bouts of diarrhea  Endocrine: Negative for cold intolerance, heat intolerance, polydipsia, polyphagia and polyuria.  Genitourinary: Negative for difficulty  urinating and flank pain.  Musculoskeletal: Negative for arthralgias, back pain, gait problem, joint swelling, myalgias, neck pain and neck stiffness.  Skin: Negative for color change, pallor, rash and wound.  Neurological: Negative for dizziness, weakness, light-headedness and headaches.  Hematological: Does not bruise/bleed easily.  Psychiatric/Behavioral: Negative for decreased concentration, dysphoric mood, hallucinations, self-injury, sleep disturbance and suicidal ideas. The patient is not nervous/anxious and is not hyperactive.        Objective:   Physical Exam  Constitutional: She is oriented to person, place, and time. She appears well-developed and well-nourished. No distress.  HENT:  Head: Normocephalic and atraumatic.  Right Ear: Tympanic membrane, external ear and ear canal normal. Tympanic membrane is not erythematous and not bulging. No decreased hearing is noted.  Left Ear: Hearing, tympanic membrane, external ear and ear canal normal. Tympanic membrane is not erythematous and not bulging. No decreased hearing is noted.  Nose: No mucosal edema or rhinorrhea. Right sinus exhibits no maxillary sinus tenderness and no frontal sinus tenderness.  Left sinus exhibits no maxillary sinus tenderness and no frontal sinus tenderness.  Mouth/Throat: Uvula is midline, oropharynx is clear and moist and mucous membranes are normal.  Eyes: Pupils are equal, round, and reactive to light. Conjunctivae are normal.  Neck: Normal range of motion. Neck supple.  Cardiovascular: Normal rate, regular rhythm, normal heart sounds and intact distal pulses.  No murmur heard. Pulmonary/Chest: Effort normal. No respiratory distress. She has no decreased breath sounds. She has no wheezes. She has no rhonchi. She has no rales. She exhibits no tenderness. Right breast exhibits no inverted nipple, no mass, no nipple discharge, no skin change and no tenderness. Left breast exhibits no inverted nipple, no mass, no  nipple discharge, no skin change and no tenderness.  Well healed surgical scar on outer L breast  Abdominal: Soft. Bowel sounds are normal. She exhibits no distension and no mass. There is no tenderness. There is no rebound and no guarding.  Genitourinary: Vagina normal and uterus normal. No vaginal discharge found.  Genitourinary Comments: Chaperone present during examination.  Musculoskeletal: Normal range of motion. She exhibits no edema or tenderness.  Lymphadenopathy:    She has no cervical adenopathy.  Neurological: She is alert and oriented to person, place, and time. Coordination normal.  Skin: Skin is warm and dry. No rash noted. She is not diaphoretic. No erythema. No pallor.  Psychiatric: She has a normal mood and affect. Her behavior is normal. Judgment and thought content normal.  Nursing note and vitals reviewed.         Assessment & Plan:   1. Screening for cervical cancer   2. Need for Tdap vaccination   3. Need for shingles vaccine   4. Healthcare maintenance   5. Hyperlipidemia, unspecified hyperlipidemia type   6. Vitamin D insufficiency   7. B12 deficiency   8. Diabetes mellitus due to underlying condition with hyperosmolarity without coma, without long-term current use of insulin ([place])   9. History of breast cancer     Healthcare maintenance Continue all medications as directed. Great job on your diabetes control! We will call you when your labs/PAP results are available. Recommend follow-up in 6 months, that way your A1c is being checked at least twice a year (once with [place], once with your Endocrinologist). You are doing a GREAT job taking care of yourself!  Diabetes ([place]) Per pt last A1c with Endo/Dr. Buddy Duty- 6.6 a few weeks ago AM BS consistently <120 Denies episodes of hypoglycemia Currently on: Synjardy 12.12/998 BID Ozempic 0.25mg  QW  Hyperlipidemia Lipids checked today Currently on lovastatin 40mg  QD Continues to follow heart healthy diet and  moves as often as possible  Screening for cervical cancer PAP completed today  History of breast cancer Mammogram schedule at end of April 2019    FOLLOW-UP:  Return in about 6 months (around 05/14/2018) for Regular Follow Up, Diabetes.

## 2017-11-11 ENCOUNTER — Other Ambulatory Visit (HOSPITAL_COMMUNITY)
Admission: RE | Admit: 2017-11-11 | Discharge: 2017-11-11 | Disposition: A | Discharge status: Home / Self Care | Payer: Managed Care, Other (non HMO) | Source: Ambulatory Visit | Attending: Adult Health | Admitting: Adult Health

## 2017-11-11 ENCOUNTER — Encounter: Payer: Self-pay | Admitting: Adult Health

## 2017-11-11 ENCOUNTER — Other Ambulatory Visit: Payer: Self-pay | Admitting: Adult Health

## 2017-11-11 ENCOUNTER — Ambulatory Visit (INDEPENDENT_AMBULATORY_CARE_PROVIDER_SITE_OTHER): Payer: Managed Care, Other (non HMO) | Admitting: Adult Health

## 2017-11-11 VITALS — BP 88/51 | HR 57 | Ht 64.0 in | Wt 124.7 lb

## 2017-11-11 DIAGNOSIS — Z853 Personal history of malignant neoplasm of breast: Secondary | ICD-10-CM

## 2017-11-11 DIAGNOSIS — E785 Hyperlipidemia, unspecified: Secondary | ICD-10-CM | POA: Diagnosis not present

## 2017-11-11 DIAGNOSIS — E0800 Diabetes mellitus due to underlying condition with hyperosmolarity without nonketotic hyperglycemic-hyperosmolar coma (NKHHC): Secondary | ICD-10-CM | POA: Diagnosis not present

## 2017-11-11 DIAGNOSIS — Z0000 Encounter for general adult medical examination without abnormal findings: Secondary | ICD-10-CM | POA: Insufficient documentation

## 2017-11-11 DIAGNOSIS — Z124 Encounter for screening for malignant neoplasm of cervix: Secondary | ICD-10-CM | POA: Insufficient documentation

## 2017-11-11 DIAGNOSIS — E538 Deficiency of other specified B group vitamins: Secondary | ICD-10-CM | POA: Diagnosis not present

## 2017-11-11 DIAGNOSIS — E559 Vitamin D deficiency, unspecified: Secondary | ICD-10-CM | POA: Diagnosis not present

## 2017-11-11 DIAGNOSIS — Z23 Encounter for immunization: Secondary | ICD-10-CM | POA: Diagnosis not present

## 2017-11-11 NOTE — Assessment & Plan Note (Signed)
Continue all medications as directed. Great job on your diabetes control! We will call you when your labs/PAP results are available. Recommend follow-up in 6 months, that way your A1c is being checked at least twice a year (once with [place], once with your Endocrinologist). You are doing a GREAT job taking care of yourself!

## 2017-11-11 NOTE — Assessment & Plan Note (Signed)
Per pt last A1c with Endo/Dr. Buddy Duty- 6.6 a few weeks ago AM BS consistently <120 Denies episodes of hypoglycemia Currently on: Synjardy 12.12/998 BID Ozempic 0.25mg  QW

## 2017-11-11 NOTE — Patient Instructions (Addendum)
Preventive Care for Adults, Female  A healthy lifestyle and preventive care can promote health and wellness. Preventive health guidelines for women include the following key practices.   A routine yearly physical is a good way to check with your health care provider about your health and preventive screening. It is a chance to share any concerns and updates on your health and to receive a thorough exam.   Visit your dentist for a routine exam and preventive care every 6 months. Brush your teeth twice a day and floss once a day. Good oral hygiene prevents tooth decay and gum disease.   The frequency of eye exams is based on your age, health, family medical history, use of contact lenses, and other factors. Follow your health care provider's recommendations for frequency of eye exams.   Eat a healthy diet. Foods like vegetables, fruits, whole grains, low-fat dairy products, and lean protein foods contain the nutrients you need without too many calories. Decrease your intake of foods high in solid fats, added sugars, and salt. Eat the right amount of calories for you.Get information about a proper diet from your health care provider, if necessary.   Regular physical exercise is one of the most important things you can do for your health. Most adults should get at least 150 minutes of moderate-intensity exercise (any activity that increases your heart rate and causes you to sweat) each week. In addition, most adults need muscle-strengthening exercises on 2 or more days a week.   Maintain a healthy weight. The body mass index (BMI) is a screening tool to identify possible weight problems. It provides an estimate of body fat based on height and weight. Your health care provider can find your BMI, and can help you achieve or maintain a healthy weight.For adults 20 years and older:   - A BMI below 18.5 is considered underweight.   - A BMI of 18.5 to 24.9 is normal.   - A BMI of 25 to 29.9 is  considered overweight.   - A BMI of 30 and above is considered obese.   Maintain normal blood lipids and cholesterol levels by exercising and minimizing your intake of trans and saturated fats.  Eat a balanced diet with plenty of fruit and vegetables. Blood tests for lipids and cholesterol should begin at age 20 and be repeated every 5 years minimum.  If your lipid or cholesterol levels are high, you are over 40, or you are at high risk for heart disease, you may need your cholesterol levels checked more frequently.Ongoing high lipid and cholesterol levels should be treated with medicines if diet and exercise are not working.   If you smoke, find out from your health care provider how to quit. If you do not use tobacco, do not start.   Lung cancer screening is recommended for adults aged 55-80 years who are at high risk for developing lung cancer because of a history of smoking. A yearly low-dose CT scan of the lungs is recommended for people who have at least a 30-pack-year history of smoking and are a current smoker or have quit within the past 15 years. A pack year of smoking is smoking an average of 1 pack of cigarettes a day for 1 year (for example: 1 pack a day for 30 years or 2 packs a day for 15 years). Yearly screening should continue until the smoker has stopped smoking for at least 15 years. Yearly screening should be stopped for people who develop a   health problem that would prevent them from having lung cancer treatment.   If you are pregnant, do not drink alcohol. If you are breastfeeding, be very cautious about drinking alcohol. If you are not pregnant and choose to drink alcohol, do not have more than 1 drink per day. One drink is considered to be 12 ounces (355 mL) of beer, 5 ounces (148 mL) of wine, or 1.5 ounces (44 mL) of liquor.   Avoid use of street drugs. Do not share needles with anyone. Ask for help if you need support or instructions about stopping the use of  drugs.   High blood pressure causes heart disease and increases the risk of stroke. Your blood pressure should be checked at least yearly.  Ongoing high blood pressure should be treated with medicines if weight loss and exercise do not work.   If you are 69-55 years old, ask your health care provider if you should take aspirin to prevent strokes.   Diabetes screening involves taking a blood sample to check your fasting blood sugar level. This should be done once every 3 years, after age 38, if you are within normal weight and without risk factors for diabetes. Testing should be considered at a younger age or be carried out more frequently if you are overweight and have at least 1 risk factor for diabetes.   Breast cancer screening is essential preventive care for women. You should practice "breast self-awareness."  This means understanding the normal appearance and feel of your breasts and may include breast self-examination.  Any changes detected, no matter how small, should be reported to a health care provider.  Women in their 80s and 30s should have a clinical breast exam (CBE) by a health care provider as part of a regular health exam every 1 to 3 years.  After age 66, women should have a CBE every year.  Starting at age 1, women should consider having a mammogram (breast X-ray test) every year.  Women who have a family history of breast cancer should talk to their health care provider about genetic screening.  Women at a high risk of breast cancer should talk to their health care providers about having an MRI and a mammogram every year.   -Breast cancer gene (BRCA)-related cancer risk assessment is recommended for women who have family members with BRCA-related cancers. BRCA-related cancers include breast, ovarian, tubal, and peritoneal cancers. Having family members with these cancers may be associated with an increased risk for harmful changes (mutations) in the breast cancer genes BRCA1 and  BRCA2. Results of the assessment will determine the need for genetic counseling and BRCA1 and BRCA2 testing.   The Pap test is a screening test for cervical cancer. A Pap test can show cell changes on the cervix that might become cervical cancer if left untreated. A Pap test is a procedure in which cells are obtained and examined from the lower end of the uterus (cervix).   - Women should have a Pap test starting at age 57.   - Between ages 90 and 70, Pap tests should be repeated every 2 years.   - Beginning at age 63, you should have a Pap test every 3 years as long as the past 3 Pap tests have been normal.   - Some women have medical problems that increase the chance of getting cervical cancer. Talk to your health care provider about these problems. It is especially important to talk to your health care provider if a  new problem develops soon after your last Pap test. In these cases, your health care provider may recommend more frequent screening and Pap tests.   - The above recommendations are the same for women who have or have not gotten the vaccine for human papillomavirus (HPV).   - If you had a hysterectomy for a problem that was not cancer or a condition that could lead to cancer, then you no longer need Pap tests. Even if you no longer need a Pap test, a regular exam is a good idea to make sure no other problems are starting.   - If you are between ages 36 and 66 years, and you have had normal Pap tests going back 10 years, you no longer need Pap tests. Even if you no longer need a Pap test, a regular exam is a good idea to make sure no other problems are starting.   - If you have had past treatment for cervical cancer or a condition that could lead to cancer, you need Pap tests and screening for cancer for at least 20 years after your treatment.   - If Pap tests have been discontinued, risk factors (such as a new sexual partner) need to be reassessed to determine if screening should  be resumed.   - The HPV test is an additional test that may be used for cervical cancer screening. The HPV test looks for the virus that can cause the cell changes on the cervix. The cells collected during the Pap test can be tested for HPV. The HPV test could be used to screen women aged 70 years and older, and should be used in women of any age who have unclear Pap test results. After the age of 67, women should have HPV testing at the same frequency as a Pap test.   Colorectal cancer can be detected and often prevented. Most routine colorectal cancer screening begins at the age of 57 years and continues through age 26 years. However, your health care provider may recommend screening at an earlier age if you have risk factors for colon cancer. On a yearly basis, your health care provider may provide home test kits to check for hidden blood in the stool.  Use of a small camera at the end of a tube, to directly examine the colon (sigmoidoscopy or colonoscopy), can detect the earliest forms of colorectal cancer. Talk to your health care provider about this at age 23, when routine screening begins. Direct exam of the colon should be repeated every 5 -10 years through age 49 years, unless early forms of pre-cancerous polyps or small growths are found.   People who are at an increased risk for hepatitis B should be screened for this virus. You are considered at high risk for hepatitis B if:  -You were born in a country where hepatitis B occurs often. Talk with your health care provider about which countries are considered high risk.  - Your parents were born in a high-risk country and you have not received a shot to protect against hepatitis B (hepatitis B vaccine).  - You have HIV or AIDS.  - You use needles to inject street drugs.  - You live with, or have sex with, someone who has Hepatitis B.  - You get hemodialysis treatment.  - You take certain medicines for conditions like cancer, organ  transplantation, and autoimmune conditions.   Hepatitis C blood testing is recommended for all people born from [birth year] through 1965 and any individual  with known risks for hepatitis C.   Practice safe sex. Use condoms and avoid high-risk sexual practices to reduce the spread of sexually transmitted infections (STIs). STIs include gonorrhea, chlamydia, syphilis, trichomonas, herpes, HPV, and human immunodeficiency virus (HIV). Herpes, HIV, and HPV are viral illnesses that have no cure. They can result in disability, cancer, and death. Sexually active women aged 25 years and younger should be checked for chlamydia. Older women with new or multiple partners should also be tested for chlamydia. Testing for other STIs is recommended if you are sexually active and at increased risk.   Osteoporosis is a disease in which the bones lose minerals and strength with aging. This can result in serious bone fractures or breaks. The risk of osteoporosis can be identified using a bone density scan. Women ages 65 years and over and women at risk for fractures or osteoporosis should discuss screening with their health care providers. Ask your health care provider whether you should take a calcium supplement or vitamin D to There are also several preventive steps women can take to avoid osteoporosis and resulting fractures or to keep osteoporosis from worsening. -->Recommendations include:  Eat a balanced diet high in fruits, vegetables, calcium, and vitamins.  Get enough calcium. The recommended total intake of is 1,200 mg daily; for best absorption, if taking supplements, divide doses into 250-500 mg doses throughout the day. Of the two types of calcium, calcium carbonate is best absorbed when taken with food but calcium citrate can be taken on an empty stomach.  Get enough vitamin D. NAMS and the National Osteoporosis Foundation recommend at least 1,000 IU per day for women age 50 and over who are at risk of vitamin D  deficiency. Vitamin D deficiency can be caused by inadequate sun exposure (for example, those who live in northern latitudes).  Avoid alcohol and smoking. Heavy alcohol intake (more than 7 drinks per week) increases the risk of falls and hip fracture and women smokers tend to lose bone more rapidly and have lower bone mass than nonsmokers. Stopping smoking is one of the most important changes women can make to improve their health and decrease risk for disease.  Be physically active every day. Weight-bearing exercise (for example, fast walking, hiking, jogging, and weight training) may strengthen bones or slow the rate of bone loss that comes with aging. Balancing and muscle-strengthening exercises can reduce the risk of falling and fracture.  Consider therapeutic medications. Currently, several types of effective drugs are available. Healthcare providers can recommend the type most appropriate for each woman.  Eliminate environmental factors that may contribute to accidents. Falls cause nearly 90% of all osteoporotic fractures, so reducing this risk is an important bone-health strategy. Measures include ample lighting, removing obstructions to walking, using nonskid rugs on floors, and placing mats and/or grab bars in showers.  Be aware of medication side effects. Some common medicines make bones weaker. These include a type of steroid drug called glucocorticoids used for arthritis and asthma, some antiseizure drugs, certain sleeping pills, treatments for endometriosis, and some cancer drugs. An overactive thyroid gland or using too much thyroid hormone for an underactive thyroid can also be a problem. If you are taking these medicines, talk to your doctor about what you can do to help protect your bones.reduce the rate of osteoporosis.    Menopause can be associated with physical symptoms and risks. Hormone replacement therapy is available to decrease symptoms and risks. You should talk to your  health care provider   about whether hormone replacement therapy is right for you.   Use sunscreen. Apply sunscreen liberally and repeatedly throughout the day. You should seek shade when your shadow is shorter than you. Protect yourself by wearing long sleeves, pants, a wide-brimmed hat, and sunglasses year round, whenever you are outdoors.   Once a month, do a whole body skin exam, using a mirror to look at the skin on your back. Tell your health care provider of new moles, moles that have irregular borders, moles that are larger than a pencil eraser, or moles that have changed in shape or color.   -Stay current with required vaccines (immunizations).   Influenza vaccine. All adults should be immunized every year.  Tetanus, diphtheria, and acellular pertussis (Td, Tdap) vaccine. Pregnant women should receive 1 dose of Tdap vaccine during each pregnancy. The dose should be obtained regardless of the length of time since the last dose. Immunization is preferred during the 27th 36th week of gestation. An adult who has not previously received Tdap or who does not know her vaccine status should receive 1 dose of Tdap. This initial dose should be followed by tetanus and diphtheria toxoids (Td) booster doses every 10 years. Adults with an unknown or incomplete history of completing a 3-dose immunization series with Td-containing vaccines should begin or complete a primary immunization series including a Tdap dose. Adults should receive a Td booster every 10 years.  Varicella vaccine. An adult without evidence of immunity to varicella should receive 2 doses or a second dose if she has previously received 1 dose. Pregnant females who do not have evidence of immunity should receive the first dose after pregnancy. This first dose should be obtained before leaving the health care facility. The second dose should be obtained 4 8 weeks after the first dose.  Human papillomavirus (HPV) vaccine. Females aged 13 26  years who have not received the vaccine previously should obtain the 3-dose series. The vaccine is not recommended for use in pregnant females. However, pregnancy testing is not needed before receiving a dose. If a female is found to be pregnant after receiving a dose, no treatment is needed. In that case, the remaining doses should be delayed until after the pregnancy. Immunization is recommended for any person with an immunocompromised condition through the age of 26 years if she did not get any or all doses earlier. During the 3-dose series, the second dose should be obtained 4 8 weeks after the first dose. The third dose should be obtained 24 weeks after the first dose and 16 weeks after the second dose.  Zoster vaccine. One dose is recommended for adults aged 60 years or older unless certain conditions are present.  Measles, mumps, and rubella (MMR) vaccine. Adults born before 1957 generally are considered immune to measles and mumps. Adults born in 1957 or later should have 1 or more doses of MMR vaccine unless there is a contraindication to the vaccine or there is laboratory evidence of immunity to each of the three diseases. A routine second dose of MMR vaccine should be obtained at least 28 days after the first dose for students attending postsecondary schools, health care workers, or international travelers. People who received inactivated measles vaccine or an unknown type of measles vaccine during 1963 1967 should receive 2 doses of MMR vaccine. People who received inactivated mumps vaccine or an unknown type of mumps vaccine before 1979 and are at high risk for mumps infection should consider immunization with 2 doses of   MMR vaccine. For females of childbearing age, rubella immunity should be determined. If there is no evidence of immunity, females who are not pregnant should be vaccinated. If there is no evidence of immunity, females who are pregnant should delay immunization until after pregnancy.  Unvaccinated health care workers born before [birth year] who lack laboratory evidence of measles, mumps, or rubella immunity or laboratory confirmation of disease should consider measles and mumps immunization with 2 doses of MMR vaccine or rubella immunization with 1 dose of MMR vaccine.  Pneumococcal 13-valent conjugate (PCV13) vaccine. When indicated, a person who is uncertain of her immunization history and has no record of immunization should receive the PCV13 vaccine. An adult aged 54 years or older who has certain medical conditions and has not been previously immunized should receive 1 dose of PCV13 vaccine. This PCV13 should be followed with a dose of pneumococcal polysaccharide (PPSV23) vaccine. The PPSV23 vaccine dose should be obtained at least 8 weeks after the dose of PCV13 vaccine. An adult aged 58 years or older who has certain medical conditions and previously received 1 or more doses of PPSV23 vaccine should receive 1 dose of PCV13. The PCV13 vaccine dose should be obtained 1 or more years after the last PPSV23 vaccine dose.  Pneumococcal polysaccharide (PPSV23) vaccine. When PCV13 is also indicated, PCV13 should be obtained first. All adults aged 58 years and older should be immunized. An adult younger than age 65 years who has certain medical conditions should be immunized. Any person who resides in a nursing home or long-term care facility should be immunized. An adult smoker should be immunized. People with an immunocompromised condition and certain other conditions should receive both PCV13 and PPSV23 vaccines. People with human immunodeficiency virus (HIV) infection should be immunized as soon as possible after diagnosis. Immunization during chemotherapy or radiation therapy should be avoided. Routine use of PPSV23 vaccine is not recommended for American Indians, [place] Natives, or people younger than 65 years unless there are medical conditions that require PPSV23 vaccine. When indicated,  people who have unknown immunization and have no record of immunization should receive PPSV23 vaccine. One-time revaccination 5 years after the first dose of PPSV23 is recommended for people aged 70 64 years who have chronic kidney failure, nephrotic syndrome, asplenia, or immunocompromised conditions. People who received 1 2 doses of PPSV23 before age 32 years should receive another dose of PPSV23 vaccine at age 96 years or later if at least 5 years have passed since the previous dose. Doses of PPSV23 are not needed for people immunized with PPSV23 at or after age 55 years.  Meningococcal vaccine. Adults with asplenia or persistent complement component deficiencies should receive 2 doses of quadrivalent meningococcal conjugate (MenACWY-D) vaccine. The doses should be obtained at least 2 months apart. Microbiologists working with certain meningococcal bacteria, [employer] recruits, people at risk during an outbreak, and people who travel to or live in countries with a high rate of meningitis should be immunized. A first-year college student up through age 58 years who is living in a residence hall should receive a dose if she did not receive a dose on or after her 16th birthday. Adults who have certain high-risk conditions should receive one or more doses of vaccine.  Hepatitis A vaccine. Adults who wish to be protected from this disease, have certain high-risk conditions, work with hepatitis A-infected animals, work in hepatitis A research labs, or travel to or work in countries with a high rate of hepatitis A should be  immunized. Adults who were previously unvaccinated and who anticipate close contact with an international adoptee during the first 60 days after arrival in the [place] from a country with a high rate of hepatitis A should be immunized.  Hepatitis B vaccine.  Adults who wish to be protected from this disease, have certain high-risk conditions, may be exposed to blood or other infectious  body fluids, are household contacts or sex partners of hepatitis B positive people, are clients or workers in certain care facilities, or travel to or work in countries with a high rate of hepatitis B should be immunized.  Haemophilus influenzae type b (Hib) vaccine. A previously unvaccinated person with asplenia or sickle cell disease or having a scheduled splenectomy should receive 1 dose of Hib vaccine. Regardless of previous immunization, a recipient of a hematopoietic stem cell transplant should receive a 3-dose series 6 12 months after her successful transplant. Hib vaccine is not recommended for adults with HIV infection.  Preventive Services / Frequency Ages 6 to 39years  Blood pressure check.** / Every 1 to 2 years.  Lipid and cholesterol check.** / Every 5 years beginning at age 39.  Clinical breast exam.** / Every 3 years for women in their 61s and 62s.  BRCA-related cancer risk assessment.** / For women who have family members with a BRCA-related cancer (breast, ovarian, tubal, or peritoneal cancers).  Pap test.** / Every 2 years from ages 47 through 85. Every 3 years starting at age 34 through age 12 or 74 with a history of 3 consecutive normal Pap tests.  HPV screening.** / Every 3 years from ages 46 through ages 43 to 54 with a history of 3 consecutive normal Pap tests.  Hepatitis C blood test.** / For any individual with known risks for hepatitis C.  Skin self-exam. / Monthly.  Influenza vaccine. / Every year.  Tetanus, diphtheria, and acellular pertussis (Tdap, Td) vaccine.** / Consult your health care provider. Pregnant women should receive 1 dose of Tdap vaccine during each pregnancy. 1 dose of Td every 10 years.  Varicella vaccine.** / Consult your health care provider. Pregnant females who do not have evidence of immunity should receive the first dose after pregnancy.  HPV vaccine. / 3 doses over 6 months, if 64 and younger. The vaccine is not recommended for use in  pregnant females. However, pregnancy testing is not needed before receiving a dose.  Measles, mumps, rubella (MMR) vaccine.** / You need at least 1 dose of MMR if you were born in 1957 or later. You may also need a 2nd dose. For females of childbearing age, rubella immunity should be determined. If there is no evidence of immunity, females who are not pregnant should be vaccinated. If there is no evidence of immunity, females who are pregnant should delay immunization until after pregnancy.  Pneumococcal 13-valent conjugate (PCV13) vaccine.** / Consult your health care provider.  Pneumococcal polysaccharide (PPSV23) vaccine.** / 1 to 2 doses if you smoke cigarettes or if you have certain conditions.  Meningococcal vaccine.** / 1 dose if you are age 71 to 37 years and a Market researcher living in a residence hall, or have one of several medical conditions, you need to get vaccinated against meningococcal disease. You may also need additional booster doses.  Hepatitis A vaccine.** / Consult your health care provider.  Hepatitis B vaccine.** / Consult your health care provider.  Haemophilus influenzae type b (Hib) vaccine.** / Consult your health care provider.  Ages 55 to 64years  Blood pressure check.** / Every 1 to 2 years.  Lipid and cholesterol check.** / Every 5 years beginning at age 20 years.  Lung cancer screening. / Every year if you are aged 55 80 years and have a 30-pack-year history of smoking and currently smoke or have quit within the past 15 years. Yearly screening is stopped once you have quit smoking for at least 15 years or develop a health problem that would prevent you from having lung cancer treatment.  Clinical breast exam.** / Every year after age 40 years.  BRCA-related cancer risk assessment.** / For women who have family members with a BRCA-related cancer (breast, ovarian, tubal, or peritoneal cancers).  Mammogram.** / Every year beginning at age 40  years and continuing for as long as you are in good health. Consult with your health care provider.  Pap test.** / Every 3 years starting at age 30 years through age 65 or 70 years with a history of 3 consecutive normal Pap tests.  HPV screening.** / Every 3 years from ages 30 years through ages 65 to 70 years with a history of 3 consecutive normal Pap tests.  Fecal occult blood test (FOBT) of stool. / Every year beginning at age 50 years and continuing until age 75 years. You may not need to do this test if you get a colonoscopy every 10 years.  Flexible sigmoidoscopy or colonoscopy.** / Every 5 years for a flexible sigmoidoscopy or every 10 years for a colonoscopy beginning at age 50 years and continuing until age 75 years.  Hepatitis C blood test.** / For all people born from 1945 through 1965 and any individual with known risks for hepatitis C.  Skin self-exam. / Monthly.  Influenza vaccine. / Every year.  Tetanus, diphtheria, and acellular pertussis (Tdap/Td) vaccine.** / Consult your health care provider. Pregnant women should receive 1 dose of Tdap vaccine during each pregnancy. 1 dose of Td every 10 years.  Varicella vaccine.** / Consult your health care provider. Pregnant females who do not have evidence of immunity should receive the first dose after pregnancy.  Zoster vaccine.** / 1 dose for adults aged 60 years or older.  Measles, mumps, rubella (MMR) vaccine.** / You need at least 1 dose of MMR if you were born in 1957 or later. You may also need a 2nd dose. For females of childbearing age, rubella immunity should be determined. If there is no evidence of immunity, females who are not pregnant should be vaccinated. If there is no evidence of immunity, females who are pregnant should delay immunization until after pregnancy.  Pneumococcal 13-valent conjugate (PCV13) vaccine.** / Consult your health care provider.  Pneumococcal polysaccharide (PPSV23) vaccine.** / 1 to 2 doses if  you smoke cigarettes or if you have certain conditions.  Meningococcal vaccine.** / Consult your health care provider.  Hepatitis A vaccine.** / Consult your health care provider.  Hepatitis B vaccine.** / Consult your health care provider.  Haemophilus influenzae type b (Hib) vaccine.** / Consult your health care provider.  Ages 65 years and over  Blood pressure check.** / Every 1 to 2 years.  Lipid and cholesterol check.** / Every 5 years beginning at age 20 years.  Lung cancer screening. / Every year if you are aged 55 80 years and have a 30-pack-year history of smoking and currently smoke or have quit within the past 15 years. Yearly screening is stopped once you have quit smoking for at least 15 years or develop a health problem that   would prevent you from having lung cancer treatment.  Clinical breast exam.** / Every year after age 103 years.  BRCA-related cancer risk assessment.** / For women who have family members with a BRCA-related cancer (breast, ovarian, tubal, or peritoneal cancers).  Mammogram.** / Every year beginning at age 36 years and continuing for as long as you are in good health. Consult with your health care provider.  Pap test.** / Every 3 years starting at age 5 years through age 85 or 10 years with 3 consecutive normal Pap tests. Testing can be stopped between 65 and 70 years with 3 consecutive normal Pap tests and no abnormal Pap or HPV tests in the past 10 years.  HPV screening.** / Every 3 years from ages 93 years through ages 70 or 45 years with a history of 3 consecutive normal Pap tests. Testing can be stopped between 65 and 70 years with 3 consecutive normal Pap tests and no abnormal Pap or HPV tests in the past 10 years.  Fecal occult blood test (FOBT) of stool. / Every year beginning at age 8 years and continuing until age 45 years. You may not need to do this test if you get a colonoscopy every 10 years.  Flexible sigmoidoscopy or colonoscopy.** /  Every 5 years for a flexible sigmoidoscopy or every 10 years for a colonoscopy beginning at age 69 years and continuing until age 68 years.  Hepatitis C blood test.** / For all people born from [birth year] through 1964 and any individual with known risks for hepatitis C.  Osteoporosis screening.** / A one-time screening for women ages 7 years and over and women at risk for fractures or osteoporosis.  Skin self-exam. / Monthly.  Influenza vaccine. / Every year.  Tetanus, diphtheria, and acellular pertussis (Tdap/Td) vaccine.** / 1 dose of Td every 10 years.  Varicella vaccine.** / Consult your health care provider.  Zoster vaccine.** / 1 dose for adults aged 5 years or older.  Pneumococcal 13-valent conjugate (PCV13) vaccine.** / Consult your health care provider.  Pneumococcal polysaccharide (PPSV23) vaccine.** / 1 dose for all adults aged 74 years and older.  Meningococcal vaccine.** / Consult your health care provider.  Hepatitis A vaccine.** / Consult your health care provider.  Hepatitis B vaccine.** / Consult your health care provider.  Haemophilus influenzae type b (Hib) vaccine.** / Consult your health care provider. ** Family history and personal history of risk and conditions may change your health care provider's recommendations. Document Released: 09/29/2001 Document Revised: 05/24/2013  [facility] Patient Information 2014 [place], [place].   EXERCISE AND DIET:  We recommended that you start or continue a regular exercise program for good health. Regular exercise means any activity that makes your heart beat faster and makes you sweat.  We recommend exercising at least 30 minutes per day at least 3 days a week, preferably 5.  We also recommend a diet low in fat and sugar / carbohydrates.  Inactivity, poor dietary choices and obesity can cause diabetes, heart attack, stroke, and kidney damage, among others.     ALCOHOL AND SMOKING:  Women should limit their alcohol intake to no  more than 7 drinks/beers/glasses of wine (combined, not each!) per week. Moderation of alcohol intake to this level decreases your risk of breast cancer and liver damage.  ( And of course, no recreational drugs are part of a healthy lifestyle.)  Also, you should not be smoking at all or even being exposed to second hand smoke. Most people know smoking can  cause cancer, and various heart and lung diseases, but did you know it also contributes to weakening of your bones?  Aging of your skin?  Yellowing of your teeth and nails?   CALCIUM AND VITAMIN D:  Adequate intake of calcium and Vitamin D are recommended.  The recommendations for exact amounts of these supplements seem to change often, but generally speaking 600 mg of calcium (either carbonate or citrate) and 800 units of Vitamin D per day seems prudent. Certain women may benefit from higher intake of Vitamin D.  If you are among these women, your doctor will have told you during your visit.     PAP SMEARS:  Pap smears, to check for cervical cancer or precancers,  have traditionally been done yearly, although recent scientific advances have shown that most women can have pap smears less often.  However, every woman still should have a physical exam from her gynecologist or primary care physician every year. It will include a breast check, inspection of the vulva and vagina to check for abnormal growths or skin changes, a visual exam of the cervix, and then an exam to evaluate the size and shape of the uterus and ovaries.  And after 61 years of age, a rectal exam is indicated to check for rectal cancers. We will also provide age appropriate advice regarding health maintenance, like when you should have certain vaccines, screening for sexually transmitted diseases, bone density testing, colonoscopy, mammograms, etc.    MAMMOGRAMS:  All women over 79 years old should have a yearly mammogram. Many facilities now offer a "3D" mammogram, which may cost  around $50 extra out of pocket. If possible,  we recommend you accept the option to have the 3D mammogram performed.  It both reduces the number of women who will be called back for extra views which then turn out to be normal, and it is better than the routine mammogram at detecting truly abnormal areas.     COLONOSCOPY:  Colonoscopy to screen for colon cancer is recommended for all women at age 30.  We know, you hate the idea of the prep.  We agree, BUT, having colon cancer and not knowing it is worse!!  Colon cancer so often starts as a polyp that can be seen and removed at colonscopy, which can quite literally save your life!  And if your first colonoscopy is normal and you have no family history of colon cancer, most women don't have to have it again for 10 years.  Once every ten years, you can do something that may end up saving your life, right?  We will be happy to help you get it scheduled when you are ready.  Be sure to check your insurance coverage so you understand how much it will cost.  It may be covered as a preventative service at no cost, but you should check your particular policy.   Continue all medications as directed. Great job on your diabetes control! We will call you when your labs/PAP results are available. Recommend follow-up in 6 months, that way your A1c is being checked at least twice a year (once with [place], once with your Endocrinologist). You are doing a GREAT job taking care of yourself! NICE TO SEE YOU!

## 2017-11-11 NOTE — Assessment & Plan Note (Signed)
Lipids checked today Currently on lovastatin 40mg  QD Continues to follow heart healthy diet and moves as often as possible

## 2017-11-11 NOTE — Assessment & Plan Note (Signed)
PAP completed today 

## 2017-11-11 NOTE — Assessment & Plan Note (Signed)
Mammogram schedule at end of April 2019

## 2017-11-12 LAB — CBC WITH DIFFERENTIAL/PLATELET
BASOS ABS: 0 10*3/uL (ref 0.0–0.2)
Basos: 1 %
EOS (ABSOLUTE): 0.1 10*3/uL (ref 0.0–0.4)
EOS: 2 %
HEMATOCRIT: 37.8 % (ref 34.0–46.6)
HEMOGLOBIN: 12.6 g/dL (ref 11.1–15.9)
Immature Grans (Abs): 0 10*3/uL (ref 0.0–0.1)
Immature Granulocytes: 0 %
LYMPHS ABS: 2 10*3/uL (ref 0.7–3.1)
Lymphs: 36 %
MCH: 29.3 pg (ref 26.6–33.0)
MCHC: 33.3 g/dL (ref 31.5–35.7)
MCV: 88 fL (ref 79–97)
Monocytes Absolute: 0.6 10*3/uL (ref 0.1–0.9)
Monocytes: 10 %
NEUTROS ABS: 2.8 10*3/uL (ref 1.4–7.0)
Neutrophils: 51 %
Platelets: 320 10*3/uL (ref 150–379)
RBC: 4.3 x10E6/uL (ref 3.77–5.28)
RDW: 14.2 % (ref 12.3–15.4)
WBC: 5.4 10*3/uL (ref 3.4–10.8)

## 2017-11-12 LAB — COMPREHENSIVE METABOLIC PANEL
ALT: 19 IU/L (ref 0–32)
AST: 17 IU/L (ref 0–40)
Albumin/Globulin Ratio: 1.5 (ref 1.2–2.2)
Albumin: 4.3 g/dL (ref 3.6–4.8)
Alkaline Phosphatase: 36 IU/L — ABNORMAL LOW (ref 39–117)
BILIRUBIN TOTAL: 0.4 mg/dL (ref 0.0–1.2)
BUN / CREAT RATIO: 19 (ref 12–28)
BUN: 13 mg/dL (ref 8–27)
CHLORIDE: 103 mmol/L (ref 96–106)
CO2: 24 mmol/L (ref 20–29)
Calcium: 9.7 mg/dL (ref 8.7–10.3)
Creatinine, Ser: 0.68 mg/dL (ref 0.57–1.00)
GFR calc Af Amer: 110 mL/min/{1.73_m2} (ref >59)
GFR calc non Af Amer: 95 mL/min/{1.73_m2} (ref >59)
GLOBULIN, TOTAL: 2.8 g/dL (ref 1.5–4.5)
Glucose: 109 mg/dL — ABNORMAL HIGH (ref 65–99)
Potassium: 4.8 mmol/L (ref 3.5–5.2)
SODIUM: 139 mmol/L (ref 134–144)
Total Protein: 7.1 g/dL (ref 6.0–8.5)

## 2017-11-12 LAB — LIPID PANEL
CHOL/HDL RATIO: 2.8 ratio (ref 0.0–4.4)
Cholesterol, Total: 132 mg/dL (ref 100–199)
HDL: 48 mg/dL (ref >39)
LDL CALC: 72 mg/dL (ref 0–99)
Triglycerides: 61 mg/dL (ref 0–149)
VLDL Cholesterol Cal: 12 mg/dL (ref 5–40)

## 2017-11-12 LAB — TSH: TSH: 1.36 u[IU]/mL (ref 0.450–4.500)

## 2017-11-12 LAB — HEMOGLOBIN A1C
Est. average glucose Bld gHb Est-mCnc: 137 mg/dL
HEMOGLOBIN A1C: 6.4 % — AB (ref 4.8–5.6)

## 2017-11-12 LAB — VITAMIN D 25 HYDROXY (VIT D DEFICIENCY, FRACTURES): Vit D, 25-Hydroxy: 31.7 ng/mL (ref 30.0–100.0)

## 2017-11-12 LAB — CYTOLOGY - PAP: Diagnosis: NEGATIVE

## 2017-11-17 ENCOUNTER — Other Ambulatory Visit (INDEPENDENT_AMBULATORY_CARE_PROVIDER_SITE_OTHER): Payer: Managed Care, Other (non HMO)

## 2017-11-17 ENCOUNTER — Other Ambulatory Visit: Payer: Self-pay

## 2017-11-17 DIAGNOSIS — Z1211 Encounter for screening for malignant neoplasm of colon: Secondary | ICD-10-CM

## 2017-11-17 LAB — POC HEMOCCULT BLD/STL (HOME/3-CARD/SCREEN)
Card #2 Fecal Occult Blod, POC: NEGATIVE
Card #3 Fecal Occult Blood, POC: POSITIVE
Fecal Occult Blood, POC: NEGATIVE

## 2017-11-17 NOTE — Progress Notes (Signed)
Patient brought in IFOB cards. MPulliam, CMA/RT(R)

## 2017-11-18 ENCOUNTER — Other Ambulatory Visit: Payer: Self-pay | Admitting: Adult Health

## 2017-11-18 DIAGNOSIS — K921 Melena: Secondary | ICD-10-CM

## 2017-12-02 ENCOUNTER — Ambulatory Visit
Admission: RE | Admit: 2017-12-02 | Discharge: 2017-12-02 | Disposition: A | Discharge status: Home / Self Care | Payer: BLUE CROSS/BLUE SHIELD | Source: Ambulatory Visit | Attending: Family Medicine | Admitting: Family Medicine

## 2017-12-06 ENCOUNTER — Emergency Department (HOSPITAL_COMMUNITY)
Admission: EM | Admit: 2017-12-06 | Discharge: 2017-12-06 | Discharge status: Against Medical Advice | Payer: Managed Care, Other (non HMO) | Attending: Emergency Medicine | Admitting: Emergency Medicine

## 2017-12-06 ENCOUNTER — Other Ambulatory Visit: Payer: Self-pay

## 2017-12-06 ENCOUNTER — Emergency Department (HOSPITAL_COMMUNITY): Payer: Managed Care, Other (non HMO)

## 2017-12-06 ENCOUNTER — Encounter (HOSPITAL_COMMUNITY): Payer: Self-pay | Admitting: Emergency Medicine

## 2017-12-06 DIAGNOSIS — R112 Nausea with vomiting, unspecified: Secondary | ICD-10-CM | POA: Diagnosis not present

## 2017-12-06 DIAGNOSIS — R0789 Other chest pain: Secondary | ICD-10-CM | POA: Diagnosis not present

## 2017-12-06 DIAGNOSIS — Z853 Personal history of malignant neoplasm of breast: Secondary | ICD-10-CM | POA: Diagnosis not present

## 2017-12-06 DIAGNOSIS — R079 Chest pain, unspecified: Secondary | ICD-10-CM | POA: Diagnosis present

## 2017-12-06 LAB — CBC
HCT: 38.8 % (ref 36.0–46.0)
Hemoglobin: 12.4 g/dL (ref 12.0–15.0)
MCH: 29.7 pg (ref 26.0–34.0)
MCHC: 32 g/dL (ref 30.0–36.0)
MCV: 92.8 fL (ref 78.0–100.0)
PLATELETS: 310 10*3/uL (ref 150–400)
RBC: 4.18 MIL/uL (ref 3.87–5.11)
RDW: 14.1 % (ref 11.5–15.5)
WBC: 11.5 10*3/uL — AB (ref 4.0–10.5)

## 2017-12-06 LAB — BASIC METABOLIC PANEL
Anion gap: 11 (ref 5–15)
BUN: 15 mg/dL (ref 6–20)
CHLORIDE: 105 mmol/L (ref 101–111)
CO2: 24 mmol/L (ref 22–32)
Calcium: 9.7 mg/dL (ref 8.9–10.3)
Creatinine, Ser: 0.89 mg/dL (ref 0.44–1.00)
GFR calc non Af Amer: >60 mL/min (ref >60)
Glucose, Bld: 118 mg/dL — ABNORMAL HIGH (ref 65–99)
POTASSIUM: 4.2 mmol/L (ref 3.5–5.1)
SODIUM: 140 mmol/L (ref 135–145)

## 2017-12-06 LAB — I-STAT BETA HCG BLOOD, ED (MC, WL, AP ONLY): I-stat hCG, quantitative: <5 m[IU]/mL (ref <5)

## 2017-12-06 LAB — I-STAT TROPONIN, ED: Troponin i, poc: 0.03 ng/mL (ref 0.00–0.08)

## 2017-12-06 NOTE — ED Notes (Addendum)
Pt informed staff that she wants to leave. Pt encouraged to stay. Pt asking about how to receive results, informed she'll have to visit medical records.

## 2017-12-06 NOTE — ED Triage Notes (Signed)
Pt BIB [facility] with c/o left neck pain that started at 1500 today. Around 1600, pt developed sudden onset chest pain/pressure, left arm pain and nausea/vomiting. Given 324mg  aspirin and 2 NTG without change in pain, given 4mg  zofran PTA. EMS vitals: BP 104/64, CBG 184

## 2017-12-06 NOTE — ED Provider Notes (Signed)
MSE was initiated and I personally evaluated the patient and placed orders (if any) at  5:37 PM on December 06, 2017.  The patient appears stable so that the remainder of the MSE may be completed by another provider. Patient placed in Quick Look pathway, seen and evaluated   Chief Complaint: Chest pain  HPI:   With history of breast cancer and previous lung deck ectomy on the right presents emergency department with chief complaint of chest pain.  Patient states that she began by having pain in her left neck and shoulder radiating down the left arm.  It then she began having pain in the left chest, began diaphoretic and started vomiting several times.  She is a previous family history of her father had a massive MI however it was not early she denies a history of blood clots.  She denies pleuritic chest pain.  She continues to have chest pain after nitro and aspirin however her nausea has resolved after Zofran  ROS: Chest pain one)  Physical Exam:   Gen: No distress  Neuro: Awake and Alert  Skin: Warm    Focused Exam: Lungs clear to auscultation bilaterally, heart normal rate and rhythm   Initiation of care has begun. The patient has been counseled on the process, plan, and necessity for staying for the completion/evaluation, and the remainder of the medical screening examination    Margarita Mail, PA-C 12/06/17 Akins, [place], DO 12/06/17 1856

## 2017-12-07 ENCOUNTER — Ambulatory Visit (INDEPENDENT_AMBULATORY_CARE_PROVIDER_SITE_OTHER): Payer: Managed Care, Other (non HMO) | Admitting: Adult Health

## 2017-12-07 ENCOUNTER — Encounter: Payer: Self-pay | Admitting: Adult Health

## 2017-12-07 VITALS — BP 120/76 | HR 63 | Ht 64.0 in | Wt 122.5 lb

## 2017-12-07 DIAGNOSIS — R079 Chest pain, unspecified: Secondary | ICD-10-CM

## 2017-12-07 NOTE — Progress Notes (Signed)
Subjective:    Patient ID: Rebecca Robertson, female    DOB: 1955-12-23, 62 y.o.   MRN: [id number]  HPI:  Ms. Battaglia presents with "just not feeling well". Yesterday she developed pain in her left neck and shoulder radiating down the left arm at 1530. Soon thereafter she became diaphoretic and began profusely vomiting.  She called EMS and was transported to [facility] ED.  BMP, CMP, troponin drawn- unremarkable. EKG- NSR  She was given nitro, aspirin, and Zofran with only her nausea resolving. She  left AMA due to wait time, approx 11-16-2098 Her father passed away at age 34 from "massive MI", he was diabetic and was previously a very heavy smoker (>40 pack/year). She currently denies pleuritic chest pain/chest heaviness/palpitaitions,. She has not vomited since yesterday. She denies dyspnea, but again "justs feels off" and LUQ dull ache, rated 4/10 She is accompanied by her husband today. She denies tobacco/ETOH use She did live with heavy smokers until age 20, constant second hand smoke exposure Reviewed all notes/labs/imaging from ED encounter yesterday.  Patient Care Team    Relationship Specialty Notifications Start End  Mina Marble D, NP PCP - General Family Medicine  06/01/17   Teena Irani, MD (Inactive) Consulting Physician Gastroenterology  06/02/16   Delrae Rend, MD Consulting Physician Endocrinology  06/02/16   Druscilla Brownie, MD Consulting Physician Dermatology  06/02/16   Nicholas Lose, MD Consulting Physician Hematology and Oncology  06/02/16   Bjorn Loser, MD Consulting Physician Urology  06/02/16   Christain [place], [place] Referring Physician Optometry  05/11/17     Patient Active Problem List   Diagnosis Date Noted  . Chest pain 12/07/2017  . Healthcare maintenance 11/11/2017  . Screening for cervical cancer 11/11/2017  . Personal history UTI- 04/07/17, + Cx- txed with abx 05/18/2017  . Urinary frequency 05/18/2017  . Diarrhea in adult patient 05/11/2017  . UTI (urinary  tract infection) 04/07/2017  . Dysuria 04/07/2017  . Abnormal urinalysis 04/07/2017  . Cervical smear, as part of routine gynecological examination 11/09/2016  . Pneumonia 09/23/2016  . Depression 05/28/2016  . B12 deficiency 05/28/2016  . SUI (stress urinary incontinence, female) 05/28/2016  . Hypertriglyceridemia 05/28/2016  . Vaginal atrophy 03/25/2016  . h/o Anxiety 03/08/2016  . Hyperlipidemia 03/04/2016  . Vitamin D insufficiency 03/04/2016  . History of breast cancer 03/04/2016  . Contact dermatitis and eczema due to cause 03/04/2016  . Closed fracture of proximal phalanx of toe 04/03/2014  . Diabetes ([place]) 03/27/2014  . Neoplasm of left breast, primary tumor staging category Tis: ductal carcinoma in situ (DCIS) 02/10/2011     Past Medical History:  Diagnosis Date  . Anxiety   . Diabetes mellitus   . History of breast cancer ONCOLOGIST-- DR Humphrey Rolls--  NO RECURRENCE   S/P LEFT PARTIAL MASTECTOMY W/ SLN BX'S  12-02-2010---  DUCTAL CARCINOMA IN SITU--  S/P RADIATION THERAPY ENDED 02-17-2011  . Hyperlipidemia   . Pneumonia   . PONV (postoperative nausea and vomiting)   . SUI (stress urinary incontinence, female)   . Vitamin D deficiency   . Vitamin D insufficiency 03/04/2016     Past Surgical History:  Procedure Laterality Date  . BLADDER SUSPENSION N/A 02/03/2013   Procedure: [facility] PROCEDURE;  Surgeon: Reece Packer, MD;  Location: [facility];  Service: Urology;  Laterality: N/A;  . BUNIONECTOMY    . CESAREAN SECTION     X4  . MENISCUS REPAIR Left   . PARTIAL MASTECTOMY WITH AXILLARY SENTINEL LYMPH  NODE BIOPSY Left 12-02-2010     Family History  Problem Relation Age of Onset  . Cancer Mother        lung  . Heart attack Father   . COPD Father   . Heart disease Father      Social History   Substance and Sexual Activity  Drug Use No     Social History   Substance and Sexual Activity  Alcohol Use No     Social History   Tobacco  Use  Smoking Status Never Smoker  Smokeless Tobacco Never Used     Outpatient Encounter Medications as of 12/07/2017  Medication Sig Note  . BAYER CONTOUR NEXT TEST test strip Check blood sugar daily 03/04/2016: Received from: External Pharmacy Received Sig:   . citalopram (CELEXA) 20 MG tablet Take 1 tablet (20 mg total) daily by mouth.   . fenofibrate 160 MG tablet TAKE 1 TABLET BY MOUTH DAILY. PATIENT MUST HAVE OFFICE VISIT PRIOR TO ANY FURTHER REFILLS   . glucosamine-chondroitin 500-400 MG tablet Take 1 tablet daily by mouth.   Marland Kitchen ibuprofen (ADVIL,MOTRIN) 600 MG tablet Take 1 tablet (600 mg total) by mouth every 6 (six) hours as needed.   . loperamide (IMODIUM) 2 MG capsule Take 1 capsule (2 mg total) by mouth as needed for diarrhea or loose stools.   . lovastatin (MEVACOR) 40 MG tablet TAKE 1 TABLET BY MOUTH DAILY AT 8 PM.   . nitrofurantoin (MACRODANTIN) 100 MG capsule Take 1 capsule (100 mg total) by mouth 2 (two) times daily.   Marland Kitchen oxybutynin (DITROPAN) 5 MG tablet Take 1 tablet (5 mg total) 2 (two) times daily by mouth.   Marland Kitchen OZEMPIC 0.25 or 0.5 MG/DOSE SOPN Inject 0.25 mg into the skin once a week.   Marland Kitchen SYNJARDY 12.12-998 MG TABS Take 1 tablet 2 (two) times daily by mouth.  09/10/2015: Received from: External Pharmacy  . trimethoprim (TRIMPEX) 100 MG tablet    . UNKNOWN TO PATIENT abx for current UTI    No facility-administered encounter medications on file as of 12/07/2017.     Allergies: Compazine and Bydureon [exenatide]  Body mass index is 21.03 kg/m.  Blood pressure 120/76, pulse 63, height 5\' 4"  (1.626 m), weight 122 lb 8 oz (55.6 kg), SpO2 98 %.   Review of Systems  Constitutional: Positive for activity change and fatigue. Negative for appetite change, chills, diaphoresis, fever and unexpected weight change.  Eyes: Negative for visual disturbance.  Respiratory: Negative for cough, chest tightness, shortness of breath, wheezing and stridor.   Cardiovascular: Negative for  chest pain, palpitations and leg swelling.  Gastrointestinal: Positive for nausea. Negative for abdominal distention, abdominal pain, blood in stool, constipation, diarrhea and vomiting.  Endocrine: Negative for cold intolerance, heat intolerance, polydipsia, polyphagia and polyuria.  Genitourinary: Negative for difficulty urinating, flank pain and hematuria.  Neurological: Negative for dizziness and headaches.  Hematological: Does not bruise/bleed easily.       Objective:   Physical Exam  Constitutional: She is oriented to person, place, and time. She appears well-developed and well-nourished. No distress.  HENT:  Head: Normocephalic and atraumatic.  Right Ear: External ear normal.  Left Ear: External ear normal.  Eyes: Pupils are equal, round, and reactive to light. Conjunctivae are normal.  Cardiovascular: Normal rate, regular rhythm, normal heart sounds and intact distal pulses.  No murmur heard. Pulmonary/Chest: Effort normal and breath sounds normal. No respiratory distress. She has no wheezes. She has no rales. She exhibits no tenderness.  Abdominal: Soft. Bowel sounds are normal. She exhibits no distension and no mass. There is tenderness in the left upper quadrant. There is no rebound, no guarding, no CVA tenderness, no tenderness at McBurney's point and negative Murphy's sign.  Neurological: She is alert and oriented to person, place, and time.  Skin: Skin is warm and dry. No rash noted. She is not diaphoretic. No erythema.  Psychiatric: She has a normal mood and affect. Her behavior is normal. Judgment and thought content normal.  Nursing note and vitals reviewed.     Assessment & Plan:   1. Chest pain, unspecified type     Chest pain EKG- no change from yesterday. CMP/BMP/Troponin unremarkable from ED visit yesterday Pt does not appear in acute distress, well groomed and smiling. Exercise Stress Test Orderd. If you experience any "Red Flag" symptoms please seek  immediate medical attention (ie- chest pain, chest heaviness, significant nausea/vomiting, shoulder pain with radiation down arm, significant sweating). Please call clinic with any questions/concerns.   FOLLOW-UP:  Return if symptoms worsen or fail to improve.

## 2017-12-07 NOTE — Patient Instructions (Signed)
Exercise Stress Electrocardiogram An exercise stress electrocardiogram is a test that is done to evaluate the blood supply to your heart. This test may also be called exercise stress electrocardiography. The test is done while you are walking on a treadmill. The goal of this test is to raise your heart rate. This test is done to find areas of poor blood flow to the heart by determining the extent of coronary artery disease (CAD). CAD is defined as narrowing in one or more heart (coronary) arteries of more than 70%. If you have an abnormal test result, this may mean that you are not getting adequate blood flow to your heart during exercise. Additional testing may be needed to understand why your test was abnormal. Tell a health care provider about:  Any allergies you have.  All medicines you are taking, including vitamins, herbs, eye drops, creams, and over-the-counter medicines.  Any problems you or family members have had with anesthetic medicines.  Any blood disorders you have.  Any surgeries you have had.  Any medical conditions you have.  Possibility of pregnancy, if this applies. What are the risks? Generally, this is a safe procedure. However, as with any procedure, complications can occur. Possible complications can include:  Pain or pressure in the following areas: ? Chest. ? Jaw or neck. ? Between your shoulder blades. ? Radiating down your left arm.  Dizziness or light-headedness.  Shortness of breath.  Increased or irregular heartbeats.  Nausea or vomiting.  Heart attack (rare).  What happens before the procedure?  Avoid all forms of caffeine 24 hours before your test or as directed by your health care provider. This includes coffee, tea (even decaffeinated tea), caffeinated sodas, chocolate, cocoa, and certain pain medicines.  Follow your health care provider's instructions regarding eating and drinking before the test.  Take your medicines as directed at  regular times with water unless instructed otherwise. Exceptions may include: ? If you have diabetes, ask how you are to take your insulin or pills. It is common to adjust insulin dosing the morning of the test. ? If you are taking beta-blocker medicines, it is important to talk to your health care provider about these medicines well before the date of your test. Taking beta-blocker medicines may interfere with the test. In some cases, these medicines need to be changed or stopped 24 hours or more before the test. ? If you wear a nitroglycerin patch, it may need to be removed prior to the test. Ask your health care provider if the patch should be removed before the test.  If you use an inhaler for any breathing condition, bring it with you to the test.  If you are an outpatient, bring a snack so you can eat right after the stress phase of the test.  Do not smoke for 4 hours prior to the test or as directed by your health care provider.  Do not apply lotions, powders, creams, or oils on your chest prior to the test.  Wear loose-fitting clothes and comfortable shoes for the test. This test involves walking on a treadmill. What happens during the procedure?  Multiple patches (electrodes) will be put on your chest. If needed, small areas of your chest may have to be shaved to get better contact with the electrodes. Once the electrodes are attached to your body, multiple wires will be attached to the electrodes and your heart rate will be monitored.  Your heart will be monitored both at rest and while exercising.  You will walk on a treadmill. The treadmill will be started at a slow pace. The treadmill speed and incline will gradually be increased to raise your heart rate. What happens after the procedure?  Your heart rate and blood pressure will be monitored after the test.  You may return to your normal schedule including diet, activities, and medicines, unless your health care provider tells  you otherwise. This information is not intended to replace advice given to you by your health care provider. Make sure you discuss any questions you have with your health care provider. Document Released: 07/31/2000 Document Revised: 01/09/2016 Document Reviewed: 04/10/2013 Elsevier Interactive Patient Education  2017 [employer].  EKG- no change from yesterday. Exercise Stress Test Orderd. If you experience any "Red Flag" symptoms please seek immediate medical attention (ie- chest pain, chest heaviness, significant nausea/vomiting, shoulder pain with radiation down arm, significant sweating). Please call clinic with any questions/concerns.

## 2017-12-07 NOTE — Assessment & Plan Note (Signed)
EKG- no change from yesterday. CMP/BMP/Troponin unremarkable from ED visit yesterday Pt does not appear in acute distress, well groomed and smiling. Exercise Stress Test Orderd. If you experience any "Red Flag" symptoms please seek immediate medical attention (ie- chest pain, chest heaviness, significant nausea/vomiting, shoulder pain with radiation down arm, significant sweating). Please call clinic with any questions/concerns.

## 2017-12-09 ENCOUNTER — Ambulatory Visit (INDEPENDENT_AMBULATORY_CARE_PROVIDER_SITE_OTHER): Payer: Managed Care, Other (non HMO)

## 2017-12-09 ENCOUNTER — Other Ambulatory Visit: Payer: Self-pay | Admitting: Adult Health

## 2017-12-09 DIAGNOSIS — R079 Chest pain, unspecified: Secondary | ICD-10-CM | POA: Diagnosis not present

## 2017-12-09 LAB — EXERCISE TOLERANCE TEST
CHL CUP MPHR: 160 {beats}/min
CHL CUP RESTING HR STRESS: 56 {beats}/min
CSEPEDS: 52 s
CSEPHR: 79 %
CSEPPHR: 127 {beats}/min
Estimated workload: 8.3 METS
Exercise duration (min): 6 min
RPE: 17

## 2017-12-20 IMAGING — DX DG FOOT COMPLETE 3+V*L*
3 series · 3 of 3 positions shown · non-contrast
Comparison: None.

CLINICAL DATA: Left ankle and left foot pain post fall.

EXAM:
LEFT FOOT - COMPLETE 3+ VIEW

[foot ap]
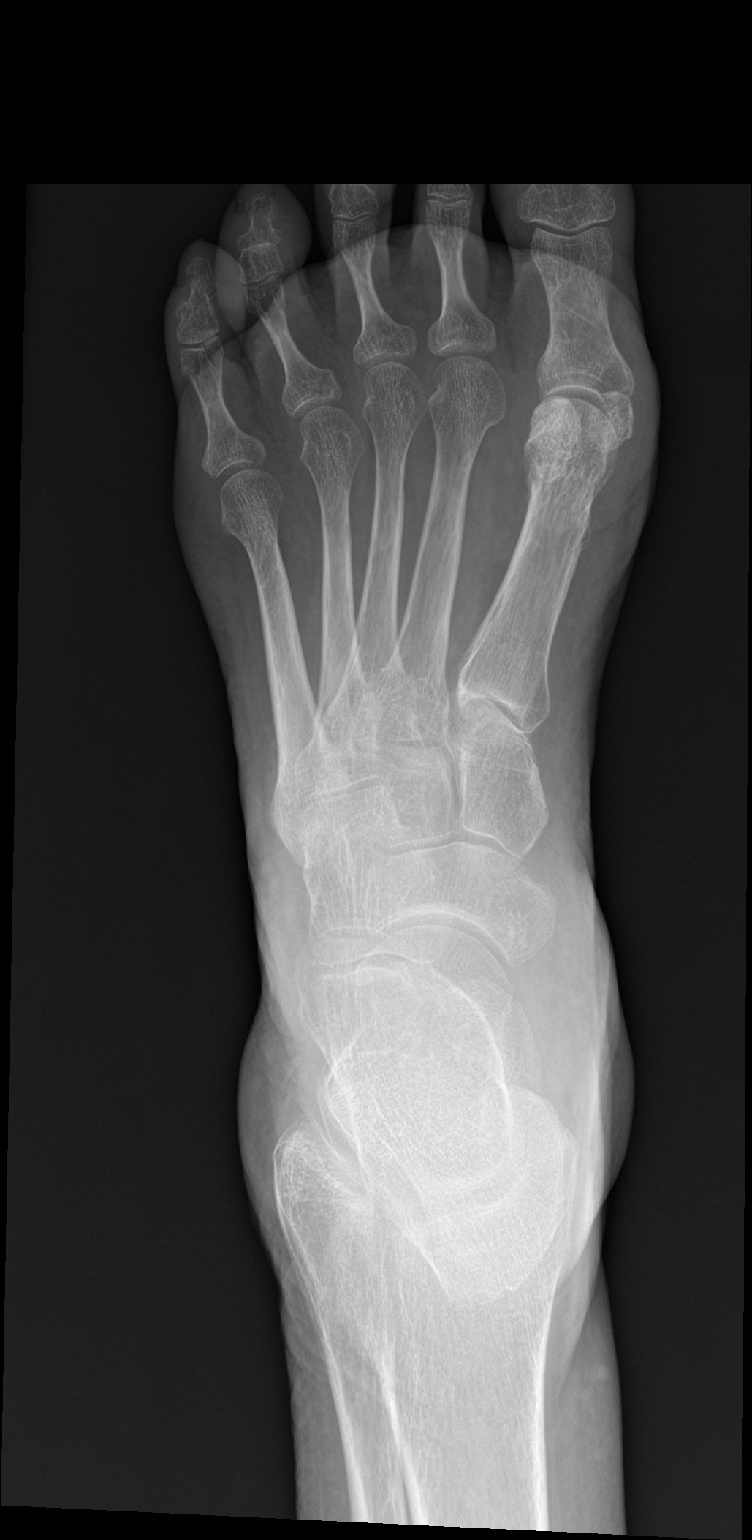

[foot obl]
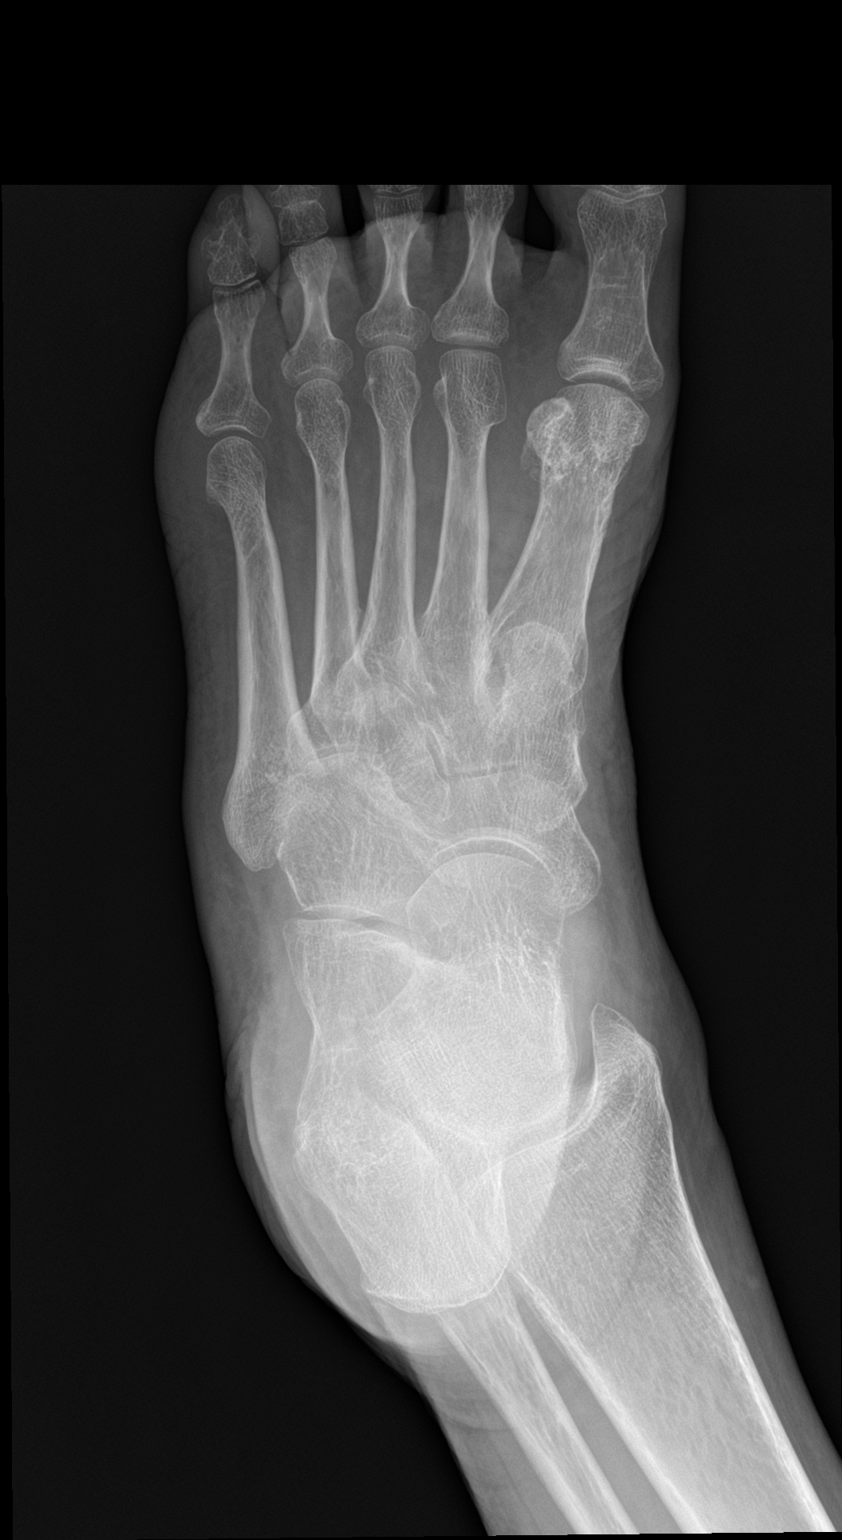

[foot lat]
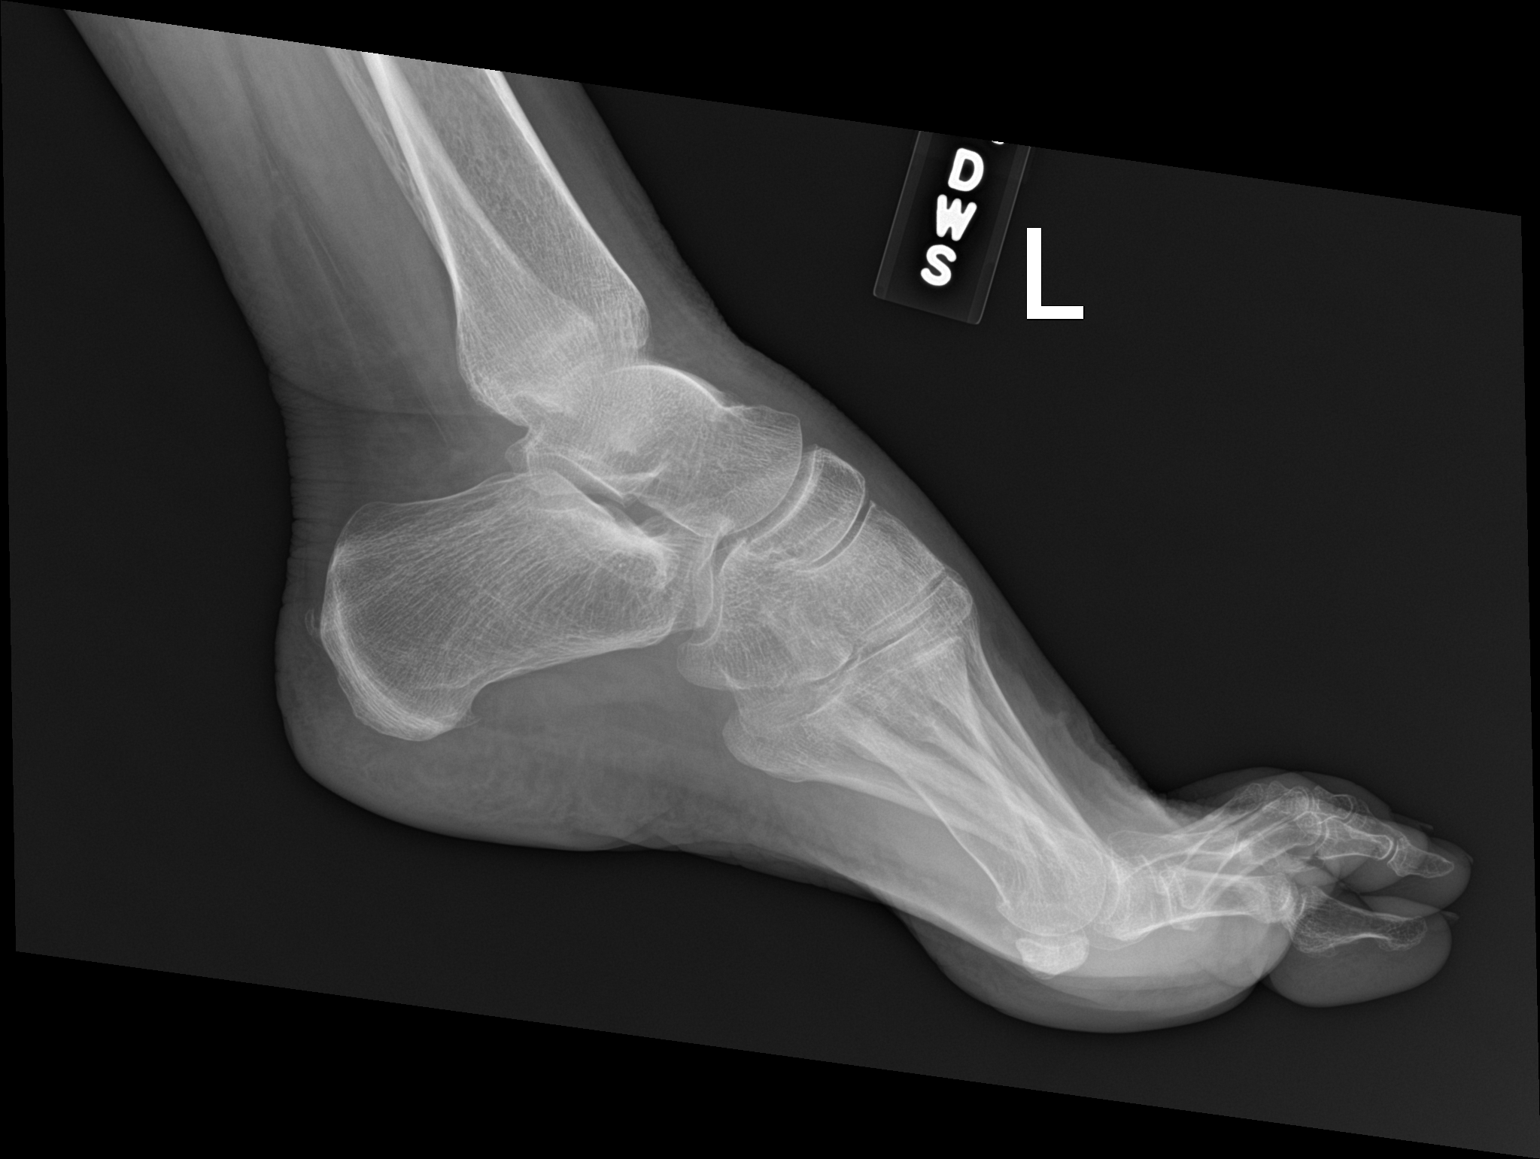

[3 of 3 positions shown; findings below may reference images not displayed]

FINDINGS: There is no evidence of fracture or dislocation. There is no
evidence of arthropathy or other focal bone abnormality. Soft
tissues are unremarkable.
IMPRESSION: Negative.

## 2018-01-07 ENCOUNTER — Other Ambulatory Visit: Payer: Self-pay | Admitting: Adult Health

## 2018-01-07 ENCOUNTER — Encounter: Payer: Self-pay | Admitting: Cardiovascular Disease

## 2018-01-07 ENCOUNTER — Ambulatory Visit (INDEPENDENT_AMBULATORY_CARE_PROVIDER_SITE_OTHER): Payer: Managed Care, Other (non HMO) | Admitting: Cardiovascular Disease

## 2018-01-07 VITALS — BP 92/62 | HR 66 | Ht 63.0 in | Wt 125.8 lb

## 2018-01-07 DIAGNOSIS — R079 Chest pain, unspecified: Secondary | ICD-10-CM

## 2018-01-07 DIAGNOSIS — E782 Mixed hyperlipidemia: Secondary | ICD-10-CM | POA: Diagnosis not present

## 2018-01-07 NOTE — H&P (View-Only) (Signed)
Cardiology Office Note   Date:  01/07/2018   ID:  Rebecca Robertson, DOB 09-22-1955, MRN [id number]  PCP:  Esaw Grandchild, NP  Cardiologist:   Kathlyn Sacramento, MD   Chief Complaint  Patient presents with  . other    Ref by Dr. Loleta Books for chest pain. Meds reviewed by the pt. verbally. Pt. c/o a spell of chest pain, left arm pain/numbness and vomiting about 1 month ago. Pt. denies anymore spells of chest pain.       History of Present Illness: Rebecca Robertson is a 62 y.o. female who was referred by Mina Marble for evaluation of chest pain.  She has no prior cardiac history.  She has prolonged history of type 2 diabetes, hyperlipidemia and previous breast cancer treated with left mastectomy and radiation therapy.  She is a lifelong non-smoker.  She does have family history of coronary artery disease.  Her father died at the age of 65 of myocardial infarction but he was a smoker and a diabetic. Last month, she had an episode of left shoulder and left arm pain which radiated to the left chest area.  It was described as aching sensation and was intense in severity.  It was associated with nausea and she vomited few times.  Due to the symptoms, she went to the emergency room.  Troponin was negative and EKG showed no acute changes.  The rest of her labs were unremarkable.  Chest x-ray showed no active cardiopulmonary disease. She was referred for an outpatient treadmill stress test.  The test was inconclusive due to submaximal heart rate reaching 79% of target. She reports no recurrent episodes over the last few weeks.  No orthopnea, PND or leg edema.    Past Medical History:  Diagnosis Date  . Anxiety   . Diabetes mellitus   . History of breast cancer ONCOLOGIST-- DR Humphrey Rolls--  NO RECURRENCE   S/P LEFT PARTIAL MASTECTOMY W/ SLN BX'S  12-02-2010---  DUCTAL CARCINOMA IN SITU--  S/P RADIATION THERAPY ENDED 02-17-2011  . Hyperlipidemia   . Pneumonia   . PONV (postoperative nausea and vomiting)     . SUI (stress urinary incontinence, female)   . Vitamin D deficiency   . Vitamin D insufficiency 03/04/2016    Past Surgical History:  Procedure Laterality Date  . BLADDER SUSPENSION N/A 02/03/2013   Procedure: [facility] PROCEDURE;  Surgeon: Reece Packer, MD;  [facility];  Service: Urology;  Laterality: N/A;  . BUNIONECTOMY    . CESAREAN SECTION     X4  . MENISCUS REPAIR Left   . PARTIAL MASTECTOMY WITH AXILLARY SENTINEL LYMPH NODE BIOPSY Left 12-02-2010     Current Outpatient Medications  Medication Sig Dispense Refill  . BAYER CONTOUR NEXT TEST test strip Check blood sugar daily  5  . citalopram (CELEXA) 20 MG tablet Take 1 tablet (20 mg total) daily by mouth. 90 tablet 2  . fenofibrate 160 MG tablet TAKE 1 TABLET BY MOUTH DAILY. PATIENT MUST HAVE OFFICE VISIT PRIOR TO ANY FURTHER REFILLS 90 tablet 2  . ibuprofen (ADVIL,MOTRIN) 600 MG tablet Take 1 tablet (600 mg total) by mouth every 6 (six) hours as needed. 30 tablet 0  . loperamide (IMODIUM) 2 MG capsule Take 1 capsule (2 mg total) by mouth as needed for diarrhea or loose stools. 30 capsule 0  . lovastatin (MEVACOR) 40 MG tablet TAKE 1 TABLET BY MOUTH DAILY AT 8 PM. 90 tablet 2  . oxybutynin (DITROPAN) 5  MG tablet TAKE 1 TABLET BY MOUTH 2 TIMES A DAY. 180 tablet 1  . OZEMPIC 0.25 or 0.5 MG/DOSE SOPN Inject 0.25 mg into the skin once a week.  4  . SYNJARDY 12.12-998 MG TABS Take 1 tablet 2 (two) times daily by mouth.   4  . trimethoprim (TRIMPEX) 100 MG tablet   11  . UNKNOWN TO PATIENT abx for current UTI     No current facility-administered medications for this visit.     Allergies:   Compazine and Bydureon [exenatide]    Social History:  The patient  reports that she has never smoked. She has never used smokeless tobacco. She reports that she does not drink alcohol or use drugs.   Family History:  The patient's family history includes COPD in her father; Cancer in her mother; Heart attack in  her father; Heart disease in her father.    ROS:  Please see the history of present illness.   Otherwise, review of systems are positive for none.   All other systems are reviewed and negative.    PHYSICAL EXAM: VS:  BP 92/62 (BP Location: Left Arm, Patient Position: Sitting, Cuff Size: Normal)   Pulse 66   Ht 5\' 3"  (1.6 m)   Wt 125 lb 12 oz (57 kg)   BMI 22.28 kg/m  , BMI Body mass index is 22.28 kg/m. GEN: Well nourished, well developed, in no acute distress  HEENT: normal  Neck: no JVD, carotid bruits, or masses Cardiac: RRR; no murmurs, rubs, or gallops,no edema  Respiratory:  clear to auscultation bilaterally, normal work of breathing GI: soft, nontender, nondistended, + BS MS: no deformity or atrophy  Skin: warm and dry, no rash Neuro:  Strength and sensation are intact Psych: euthymic mood, full affect   EKG:  EKG is ordered today. The ekg ordered today demonstrates normal sinus rhythm with isolated Q waves in lead III.  No significant ST or T wave changes.   Recent Labs: 11/11/2017: ALT 19; TSH 1.360 12/06/2017: BUN 15; Creatinine, Ser 0.89; Hemoglobin 12.4; Platelets 310; Potassium 4.2; Sodium 140    Lipid Panel    Component Value Date/Time   CHOL 132 11/11/2017 0848   TRIG 61 11/11/2017 0848   HDL 48 11/11/2017 0848   CHOLHDL 2.8 11/11/2017 0848   CHOLHDL 2.2 05/28/2016 1140   VLDL 23 05/28/2016 1140   LDLCALC 72 11/11/2017 0848      Wt Readings from Last 3 Encounters:  01/07/18 125 lb 12 oz (57 kg)  12/07/17 122 lb 8 oz (55.6 kg)  11/11/17 124 lb 11.2 oz (56.6 kg)       PAD Screen 01/07/2018  Previous PAD dx? No  Previous surgical procedure? No  Pain with walking? No  Feet/toe relief with dangling? No  Painful, non-healing ulcers? No  Extremities discolored? No      ASSESSMENT AND PLAN:  1.  Atypical chest pain in a patient with multiple risk factors for coronary artery disease including prolonged history of diabetes mellitus.  Treadmill  stress test was inconclusive due to inability to reach 85% maximal predicted heart rate.  Due to that, I recommend evaluation with with a pharmacologic nuclear stress test. I discussed with her the importance of controlling her risk factors regardless of results.  2.  Hyperlipidemia: Currently on lovastatin.  Most recent LDL was 72.  3.  Hypotension: She reports that this is chronic for her and she has no symptoms related to this.   Disposition:  FU with me as needed.   Signed,  Kathlyn Sacramento, MD  01/07/2018 11:05 AM    [place]

## 2018-01-07 NOTE — Patient Instructions (Addendum)
Medication Instructions:  Your physician recommends that you continue on your current medications as directed. Please refer to the Current Medication list given to you today.   Labwork: none  Testing/Procedures: Your physician has requested that you have a lexiscan myoview. For further information please visit [email or web address]. Please follow instruction sheet, as given.  [place]  Your caregiver has ordered a Stress Test with nuclear imaging. The purpose of this test is to evaluate the blood supply to your heart muscle. This procedure is referred to as a "Non-Invasive Stress Test." This is because other than having an IV started in your vein, nothing is inserted or "invades" your body. Cardiac stress tests are done to find areas of poor blood flow to the heart by determining the extent of coronary artery disease (CAD). Some patients exercise on a treadmill, which naturally increases the blood flow to your heart, while others who are  unable to walk on a treadmill due to physical limitations have a pharmacologic/chemical stress agent called Lexiscan . This medicine will mimic walking on a treadmill by temporarily increasing your coronary blood flow.   Please note: these test may take anywhere between 2-4 hours to complete  PLEASE REPORT TO [place] AT THE FIRST DESK WILL DIRECT YOU WHERE TO GO  Date of Procedure:_____________________________________  Arrival Time for Procedure:______________________________   PLEASE NOTIFY THE OFFICE AT LEAST 24 HOURS IN ADVANCE IF YOU ARE UNABLE TO KEEP YOUR APPOINTMENT.  [phone] AND  PLEASE NOTIFY NUCLEAR MEDICINE AT [facility] AT LEAST 24 HOURS IN ADVANCE IF YOU ARE UNABLE TO KEEP YOUR APPOINTMENT. [phone]  How to prepare for your Myoview test:  1. Do not eat or drink after midnight 2. No caffeine for 24 hours prior to test 3. No smoking 24 hours prior to test. 4. Your medication may be taken with water.   If your doctor stopped a medication because of this test, do not take that medication. 5. Ladies, please do not wear dresses.  Skirts or pants are appropriate. Please wear a short sleeve shirt. 6. No perfume, cologne or lotion. 7. Wear comfortable walking shoes. No heels!    Follow-Up: Your physician recommends that you schedule a follow-up appointment in: as needed.    Cardiac Nuclear Scan A cardiac nuclear scan is a test that measures blood flow to the heart when a person is resting and when he or she is exercising. The test looks for problems such as:  Not enough blood reaching a portion of the heart.  The heart muscle not working normally.  You may need this test if:  You have heart disease.  You have had abnormal lab results.  You have had heart surgery or angioplasty.  You have chest pain.  You have shortness of breath.  In this test, a radioactive dye (tracer) is injected into your bloodstream. After the tracer has traveled to your heart, an imaging device is used to measure how much of the tracer is absorbed by or distributed to various areas of your heart. This procedure is usually done at a hospital and takes 2-4 hours. Tell a health care provider about:  Any allergies you have.  All medicines you are taking, including vitamins, herbs, eye drops, creams, and over-the-counter medicines.  Any problems you or family members have had with the use of anesthetic medicines.  Any blood disorders you have.  Any surgeries you have had.  Any medical conditions you have.  Whether you are pregnant or  may be pregnant. What are the risks? Generally, this is a safe procedure. However, problems may occur, including:  Serious chest pain and heart attack. This is only a risk if the stress portion of the test is done.  Rapid heartbeat.  Sensation of warmth in your chest. This usually passes quickly.  What happens before the procedure?  Ask your health care provider  about changing or stopping your regular medicines. This is especially important if you are taking diabetes medicines or blood thinners.  Remove your jewelry on the day of the procedure. What happens during the procedure?  An IV tube will be inserted into one of your veins.  Your health care provider will inject a small amount of radioactive tracer through the tube.  You will wait for 20-40 minutes while the tracer travels through your bloodstream.  Your heart activity will be monitored with an electrocardiogram (ECG).  You will lie down on an exam table.  Images of your heart will be taken for about 15-20 minutes.  You may be asked to exercise on a treadmill or stationary bike. While you exercise, your heart's activity will be monitored with an ECG, and your blood pressure will be checked. If you are unable to exercise, you may be given a medicine to increase blood flow to parts of your heart.  When blood flow to your heart has peaked, a tracer will again be injected through the IV tube.  After 20-40 minutes, you will get back on the exam table and have more images taken of your heart.  When the procedure is over, your IV tube will be removed. The procedure may vary among health care providers and hospitals. Depending on the type of tracer used, scans may need to be repeated 3-4 hours later. What happens after the procedure?  Unless your health care provider tells you otherwise, you may return to your normal schedule, including diet, activities, and medicines.  Unless your health care provider tells you otherwise, you may increase your fluid intake. This will help flush the contrast dye from your body. Drink enough fluid to keep your urine clear or pale yellow.  It is up to you to get your test results. Ask your health care provider, or the department that is doing the test, when your results will be ready. Summary  A cardiac nuclear scan measures the blood flow to the heart when a  person is resting and when he or she is exercising.  You may need this test if you are at risk for heart disease.  Tell your health care provider if you are pregnant.  Unless your health care provider tells you otherwise, increase your fluid intake. This will help flush the contrast dye from your body. Drink enough fluid to keep your urine clear or pale yellow. This information is not intended to replace advice given to you by your health care provider. Make sure you discuss any questions you have with your health care provider. Document Released: 08/28/2004 Document Revised: 08/05/2016 Document Reviewed: 07/12/2013 Elsevier Interactive Patient Education  2017 [employer].

## 2018-01-07 NOTE — Progress Notes (Signed)
Cardiology Office Note   Date:  01/07/2018   ID:  Rebecca Robertson, DOB 08-Nov-1955, MRN [id number]  PCP:  Esaw Grandchild, NP  Cardiologist:   Kathlyn Sacramento, MD   Chief Complaint  Patient presents with  . other    Ref by Dr. Loleta Books for chest pain. Meds reviewed by the pt. verbally. Pt. c/o a spell of chest pain, left arm pain/numbness and vomiting about 1 month ago. Pt. denies anymore spells of chest pain.       History of Present Illness: Rebecca Robertson is a 62 y.o. female who was referred by Mina Marble for evaluation of chest pain.  She has no prior cardiac history.  She has prolonged history of type 2 diabetes, hyperlipidemia and previous breast cancer treated with left mastectomy and radiation therapy.  She is a lifelong non-smoker.  She does have family history of coronary artery disease.  Her father died at the age of 79 of myocardial infarction but he was a smoker and a diabetic. Last month, she had an episode of left shoulder and left arm pain which radiated to the left chest area.  It was described as aching sensation and was intense in severity.  It was associated with nausea and she vomited few times.  Due to the symptoms, she went to the emergency room.  Troponin was negative and EKG showed no acute changes.  The rest of her labs were unremarkable.  Chest x-ray showed no active cardiopulmonary disease. She was referred for an outpatient treadmill stress test.  The test was inconclusive due to submaximal heart rate reaching 79% of target. She reports no recurrent episodes over the last few weeks.  No orthopnea, PND or leg edema.    Past Medical History:  Diagnosis Date  . Anxiety   . Diabetes mellitus   . History of breast cancer ONCOLOGIST-- DR Humphrey Rolls--  NO RECURRENCE   S/P LEFT PARTIAL MASTECTOMY W/ SLN BX'S  12-02-2010---  DUCTAL CARCINOMA IN SITU--  S/P RADIATION THERAPY ENDED 02-17-2011  . Hyperlipidemia   . Pneumonia   . PONV (postoperative nausea and vomiting)     . SUI (stress urinary incontinence, female)   . Vitamin D deficiency   . Vitamin D insufficiency 03/04/2016    Past Surgical History:  Procedure Laterality Date  . BLADDER SUSPENSION N/A 02/03/2013   Procedure: [facility] PROCEDURE;  Surgeon: Reece Packer, MD;  Location: [facility];  Service: Urology;  Laterality: N/A;  . BUNIONECTOMY    . CESAREAN SECTION     X4  . MENISCUS REPAIR Left   . PARTIAL MASTECTOMY WITH AXILLARY SENTINEL LYMPH NODE BIOPSY Left 12-02-2010     Current Outpatient Medications  Medication Sig Dispense Refill  . BAYER CONTOUR NEXT TEST test strip Check blood sugar daily  5  . citalopram (CELEXA) 20 MG tablet Take 1 tablet (20 mg total) daily by mouth. 90 tablet 2  . fenofibrate 160 MG tablet TAKE 1 TABLET BY MOUTH DAILY. PATIENT MUST HAVE OFFICE VISIT PRIOR TO ANY FURTHER REFILLS 90 tablet 2  . ibuprofen (ADVIL,MOTRIN) 600 MG tablet Take 1 tablet (600 mg total) by mouth every 6 (six) hours as needed. 30 tablet 0  . loperamide (IMODIUM) 2 MG capsule Take 1 capsule (2 mg total) by mouth as needed for diarrhea or loose stools. 30 capsule 0  . lovastatin (MEVACOR) 40 MG tablet TAKE 1 TABLET BY MOUTH DAILY AT 8 PM. 90 tablet 2  . oxybutynin (DITROPAN) 5  MG tablet TAKE 1 TABLET BY MOUTH 2 TIMES A DAY. 180 tablet 1  . OZEMPIC 0.25 or 0.5 MG/DOSE SOPN Inject 0.25 mg into the skin once a week.  4  . SYNJARDY 12.12-998 MG TABS Take 1 tablet 2 (two) times daily by mouth.   4  . trimethoprim (TRIMPEX) 100 MG tablet   11  . UNKNOWN TO PATIENT abx for current UTI     No current facility-administered medications for this visit.     Allergies:   Compazine and Bydureon [exenatide]    Social History:  The patient  reports that she has never smoked. She has never used smokeless tobacco. She reports that she does not drink alcohol or use drugs.   Family History:  The patient's family history includes COPD in her father; Cancer in her mother; Heart attack in  her father; Heart disease in her father.    ROS:  Please see the history of present illness.   Otherwise, review of systems are positive for none.   All other systems are reviewed and negative.    PHYSICAL EXAM: VS:  BP 92/62 (BP Location: Left Arm, Patient Position: Sitting, Cuff Size: Normal)   Pulse 66   Ht 5\' 3"  (1.6 m)   Wt 125 lb 12 oz (57 kg)   BMI 22.28 kg/m  , BMI Body mass index is 22.28 kg/m. GEN: Well nourished, well developed, in no acute distress  HEENT: normal  Neck: no JVD, carotid bruits, or masses Cardiac: RRR; no murmurs, rubs, or gallops,no edema  Respiratory:  clear to auscultation bilaterally, normal work of breathing GI: soft, nontender, nondistended, + BS MS: no deformity or atrophy  Skin: warm and dry, no rash Neuro:  Strength and sensation are intact Psych: euthymic mood, full affect   EKG:  EKG is ordered today. The ekg ordered today demonstrates normal sinus rhythm with isolated Q waves in lead III.  No significant ST or T wave changes.   Recent Labs: 11/11/2017: ALT 19; TSH 1.360 12/06/2017: BUN 15; Creatinine, Ser 0.89; Hemoglobin 12.4; Platelets 310; Potassium 4.2; Sodium 140    Lipid Panel    Component Value Date/Time   CHOL 132 11/11/2017 0848   TRIG 61 11/11/2017 0848   HDL 48 11/11/2017 0848   CHOLHDL 2.8 11/11/2017 0848   CHOLHDL 2.2 05/28/2016 1140   VLDL 23 05/28/2016 1140   LDLCALC 72 11/11/2017 0848      Wt Readings from Last 3 Encounters:  01/07/18 125 lb 12 oz (57 kg)  12/07/17 122 lb 8 oz (55.6 kg)  11/11/17 124 lb 11.2 oz (56.6 kg)       PAD Screen 01/07/2018  Previous PAD dx? No  Previous surgical procedure? No  Pain with walking? No  Feet/toe relief with dangling? No  Painful, non-healing ulcers? No  Extremities discolored? No      ASSESSMENT AND PLAN:  1.  Atypical chest pain in a patient with multiple risk factors for coronary artery disease including prolonged history of diabetes mellitus.  Treadmill  stress test was inconclusive due to inability to reach 85% maximal predicted heart rate.  Due to that, I recommend evaluation with with a pharmacologic nuclear stress test. I discussed with her the importance of controlling her risk factors regardless of results.  2.  Hyperlipidemia: Currently on lovastatin.  Most recent LDL was 72.  3.  Hypotension: She reports that this is chronic for her and she has no symptoms related to this.   Disposition:  FU with me as needed.   Signed,  Kathlyn Sacramento, MD  01/07/2018 11:05 AM    [place]

## 2018-01-17 ENCOUNTER — Encounter
Admission: RE | Admit: 2018-01-17 | Discharge: 2018-01-17 | Disposition: A | Discharge status: Home / Self Care | Payer: Managed Care, Other (non HMO) | Source: Ambulatory Visit | Attending: Cardiovascular Disease | Admitting: Cardiovascular Disease

## 2018-01-17 DIAGNOSIS — R079 Chest pain, unspecified: Secondary | ICD-10-CM | POA: Diagnosis not present

## 2018-01-17 MED ORDER — TECHNETIUM TC 99M TETROFOSMIN IV KIT
33.0400 | PACK | Freq: Once | INTRAVENOUS | Status: AC | PRN
  Administered 2018-01-17: 33.04 via INTRAVENOUS

## 2018-01-17 MED ORDER — TECHNETIUM TC 99M TETROFOSMIN IV KIT
13.1300 | PACK | Freq: Once | INTRAVENOUS | Status: AC | PRN
  Administered 2018-01-17: 13.13 via INTRAVENOUS

## 2018-01-17 MED ORDER — REGADENOSON 0.4 MG/5ML IV SOLN
0.4000 mg | Freq: Once | INTRAVENOUS | Status: AC
  Administered 2018-01-17: 0.4 mg via INTRAVENOUS

## 2018-01-19 ENCOUNTER — Telehealth: Payer: Self-pay | Admitting: Cardiovascular Disease

## 2018-01-19 LAB — NM MYOCAR MULTI W/SPECT W/WALL MOTION / EF
CHL CUP NUCLEAR SSS: 0
CSEPED: 0 min
CSEPHR: 56 %
CSEPPHR: 90 {beats}/min
Estimated workload: 1 METS
Exercise duration (sec): 0 s
LV dias vol: 90 mL (ref 46–106)
LVSYSVOL: 24 mL
MPHR: 160 {beats}/min
Rest HR: 49 {beats}/min
SDS: 1
SRS: 4
TID: 1.17

## 2018-01-19 NOTE — Telephone Encounter (Signed)
See result note.  

## 2018-01-19 NOTE — Telephone Encounter (Signed)
Pt calling asking about her Myoview results  Please call back

## 2018-01-19 NOTE — Telephone Encounter (Signed)
Dr. Fletcher Anon,  I don't see this in your in-basket from Monday 6/3.  It is still listed in her chart as "Tech complete." Can you see the report?

## 2018-01-21 NOTE — Telephone Encounter (Signed)
Left a message to call back.

## 2018-01-21 NOTE — Telephone Encounter (Signed)
Patient calling to check on status of test results Would like a call today if possible, will be going on vacation and would like to know Please call to discuss

## 2018-01-24 NOTE — Telephone Encounter (Addendum)
Patient made aware of results and verbalized her understanding.   Notes recorded by Wellington Hampshire, MD on 01/19/2018 at 4:27 PM EDT Inform patient that stress test was abnormal and suggestive of a blockage in the LAD. I recommend left heart catheterization and possible PCI to evaluate this.  The patient has questions about the cardiac catheterization. Dr. Fletcher Anon has been made aware and will address.

## 2018-01-24 NOTE — Telephone Encounter (Signed)
Patient returning call.   Please call home # .

## 2018-01-25 ENCOUNTER — Telehealth: Payer: Self-pay | Admitting: *Deleted

## 2018-01-25 ENCOUNTER — Telehealth: Payer: Self-pay | Admitting: Cardiovascular Disease

## 2018-01-25 ENCOUNTER — Encounter: Payer: Self-pay | Admitting: *Deleted

## 2018-01-25 DIAGNOSIS — Z01818 Encounter for other preprocedural examination: Secondary | ICD-10-CM

## 2018-01-25 DIAGNOSIS — R079 Chest pain, unspecified: Secondary | ICD-10-CM

## 2018-01-25 LAB — BASIC METABOLIC PANEL
BUN / CREAT RATIO: 22 (ref 12–28)
BUN: 17 mg/dL (ref 8–27)
CO2: 27 mmol/L (ref 20–29)
CREATININE: 0.76 mg/dL (ref 0.57–1.00)
Calcium: 10.1 mg/dL (ref 8.7–10.3)
Chloride: 104 mmol/L (ref 96–106)
GFR calc Af Amer: 99 mL/min/{1.73_m2} (ref >59)
GFR, EST NON AFRICAN AMERICAN: 86 mL/min/{1.73_m2} (ref >59)
Glucose: 113 mg/dL — ABNORMAL HIGH (ref 65–99)
Potassium: 4.8 mmol/L (ref 3.5–5.2)
SODIUM: 140 mmol/L (ref 134–144)

## 2018-01-25 LAB — CBC
HEMATOCRIT: 38.8 % (ref 34.0–46.6)
Hemoglobin: 12.9 g/dL (ref 11.1–15.9)
MCH: 29.3 pg (ref 26.6–33.0)
MCHC: 33.2 g/dL (ref 31.5–35.7)
MCV: 88 fL (ref 79–97)
PLATELETS: 324 10*3/uL (ref 150–450)
RBC: 4.4 x10E6/uL (ref 3.77–5.28)
RDW: 13.8 % (ref 12.3–15.4)
WBC: 5.5 10*3/uL (ref 3.4–10.8)

## 2018-01-25 NOTE — Telephone Encounter (Signed)
Patient called and made aware that she is scheduled for a cardiac cath tomorrow 01/26/18 with Dr. Fletcher Anon at 1:30. She will come in today for cath instructions and to get lab work drawn.

## 2018-01-25 NOTE — Telephone Encounter (Signed)
New message    [facility] calling with stat labs

## 2018-01-25 NOTE — Telephone Encounter (Signed)
STAT labs are back, I had them scanned for you.

## 2018-01-26 ENCOUNTER — Ambulatory Visit (HOSPITAL_COMMUNITY)
Admission: RE | Disposition: A | Discharge status: Home / Self Care | Payer: Self-pay | Source: Ambulatory Visit | Attending: Cardiovascular Disease

## 2018-01-26 ENCOUNTER — Ambulatory Visit (HOSPITAL_COMMUNITY)
Admission: RE | Admit: 2018-01-26 | Discharge: 2018-01-26 | Disposition: A | Discharge status: Home / Self Care | Payer: Managed Care, Other (non HMO) | Source: Ambulatory Visit | Attending: Cardiovascular Disease | Admitting: Cardiovascular Disease

## 2018-01-26 DIAGNOSIS — E785 Hyperlipidemia, unspecified: Secondary | ICD-10-CM | POA: Insufficient documentation

## 2018-01-26 DIAGNOSIS — M79602 Pain in left arm: Secondary | ICD-10-CM | POA: Insufficient documentation

## 2018-01-26 DIAGNOSIS — I959 Hypotension, unspecified: Secondary | ICD-10-CM | POA: Insufficient documentation

## 2018-01-26 DIAGNOSIS — F419 Anxiety disorder, unspecified: Secondary | ICD-10-CM | POA: Insufficient documentation

## 2018-01-26 DIAGNOSIS — Z8249 Family history of ischemic heart disease and other diseases of the circulatory system: Secondary | ICD-10-CM | POA: Insufficient documentation

## 2018-01-26 DIAGNOSIS — R0789 Other chest pain: Secondary | ICD-10-CM | POA: Diagnosis not present

## 2018-01-26 DIAGNOSIS — R9439 Abnormal result of other cardiovascular function study: Secondary | ICD-10-CM | POA: Diagnosis not present

## 2018-01-26 DIAGNOSIS — R079 Chest pain, unspecified: Secondary | ICD-10-CM

## 2018-01-26 DIAGNOSIS — E119 Type 2 diabetes mellitus without complications: Secondary | ICD-10-CM | POA: Insufficient documentation

## 2018-01-26 DIAGNOSIS — E559 Vitamin D deficiency, unspecified: Secondary | ICD-10-CM | POA: Insufficient documentation

## 2018-01-26 DIAGNOSIS — Z853 Personal history of malignant neoplasm of breast: Secondary | ICD-10-CM | POA: Diagnosis not present

## 2018-01-26 DIAGNOSIS — Z923 Personal history of irradiation: Secondary | ICD-10-CM | POA: Diagnosis not present

## 2018-01-26 HISTORY — PX: LEFT HEART CATH AND CORONARY ANGIOGRAPHY: CATH118249

## 2018-01-26 LAB — GLUCOSE, CAPILLARY: Glucose-Capillary: 91 mg/dL (ref 65–99)

## 2018-01-26 SURGERY — LEFT HEART CATH AND CORONARY ANGIOGRAPHY
Anesthesia: LOCAL

## 2018-01-26 MED ORDER — HEPARIN SODIUM (PORCINE) 1000 UNIT/ML IJ SOLN
INTRAMUSCULAR | Status: AC
  Filled 2018-01-26: qty 1

## 2018-01-26 MED ORDER — HEPARIN (PORCINE) IN NACL 1000-0.9 UT/500ML-% IV SOLN
INTRAVENOUS | Status: AC
  Filled 2018-01-26: qty 1000

## 2018-01-26 MED ORDER — FENTANYL CITRATE (PF) 100 MCG/2ML IJ SOLN
INTRAMUSCULAR | Status: AC
  Filled 2018-01-26: qty 2

## 2018-01-26 MED ORDER — ACETAMINOPHEN 325 MG PO TABS: 650.0000 mg | ORAL_TABLET | ORAL | Status: DC | PRN

## 2018-01-26 MED ORDER — SODIUM CHLORIDE 0.9% FLUSH: 3.0000 mL | INTRAVENOUS | Status: DC | PRN

## 2018-01-26 MED ORDER — HEPARIN (PORCINE) IN NACL 2-0.9 UNITS/ML
INTRAMUSCULAR | Status: DC | PRN
  Administered 2018-01-26: 10 mL via INTRA_ARTERIAL

## 2018-01-26 MED ORDER — SODIUM CHLORIDE 0.9 % IV SOLN: 250.0000 mL | INTRAVENOUS | Status: DC | PRN

## 2018-01-26 MED ORDER — MIDAZOLAM HCL 2 MG/2ML IJ SOLN
INTRAMUSCULAR | Status: AC
  Filled 2018-01-26: qty 2

## 2018-01-26 MED ORDER — SODIUM CHLORIDE 0.9% FLUSH: 3.0000 mL | Freq: Two times a day (BID) | INTRAVENOUS | Status: DC

## 2018-01-26 MED ORDER — SODIUM CHLORIDE 0.9 % WEIGHT BASED INFUSION
3.0000 mL/kg/h | INTRAVENOUS | Status: AC
  Administered 2018-01-26: 3 mL/kg/h via INTRAVENOUS

## 2018-01-26 MED ORDER — VERAPAMIL HCL 2.5 MG/ML IV SOLN
INTRAVENOUS | Status: AC
  Filled 2018-01-26: qty 2

## 2018-01-26 MED ORDER — IOPAMIDOL (ISOVUE-370) INJECTION 76%
INTRAVENOUS | Status: DC | PRN
  Administered 2018-01-26: 50 mL via INTRA_ARTERIAL

## 2018-01-26 MED ORDER — LIDOCAINE HCL (PF) 1 % IJ SOLN
INTRAMUSCULAR | Status: AC
  Filled 2018-01-26: qty 30

## 2018-01-26 MED ORDER — HEPARIN SODIUM (PORCINE) 1000 UNIT/ML IJ SOLN
INTRAMUSCULAR | Status: DC | PRN
  Administered 2018-01-26: 3000 [IU] via INTRAVENOUS

## 2018-01-26 MED ORDER — FENTANYL CITRATE (PF) 100 MCG/2ML IJ SOLN
INTRAMUSCULAR | Status: DC | PRN
  Administered 2018-01-26: 25 ug via INTRAVENOUS

## 2018-01-26 MED ORDER — SODIUM CHLORIDE 0.9 % WEIGHT BASED INFUSION: 1.0000 mL/kg/h | INTRAVENOUS | Status: DC

## 2018-01-26 MED ORDER — ASPIRIN 81 MG PO CHEW: 81.0000 mg | CHEWABLE_TABLET | ORAL | Status: DC

## 2018-01-26 MED ORDER — ONDANSETRON HCL 4 MG/2ML IJ SOLN: 4.0000 mg | Freq: Four times a day (QID) | INTRAMUSCULAR | Status: DC | PRN

## 2018-01-26 MED ORDER — LIDOCAINE HCL (PF) 1 % IJ SOLN
INTRAMUSCULAR | Status: DC | PRN
  Administered 2018-01-26: 2 mL via SUBCUTANEOUS

## 2018-01-26 MED ORDER — IOPAMIDOL (ISOVUE-370) INJECTION 76%
INTRAVENOUS | Status: AC
  Filled 2018-01-26: qty 100

## 2018-01-26 MED ORDER — HEPARIN (PORCINE) IN NACL 2-0.9 UNITS/ML
INTRAMUSCULAR | Status: AC | PRN
  Administered 2018-01-26 (×2): 1000 mL

## 2018-01-26 MED ORDER — MIDAZOLAM HCL 2 MG/2ML IJ SOLN
INTRAMUSCULAR | Status: DC | PRN
  Administered 2018-01-26: 1 mg via INTRAVENOUS

## 2018-01-26 MED ORDER — SODIUM CHLORIDE 0.9 % IV SOLN: INTRAVENOUS | Status: DC

## 2018-01-26 SURGICAL SUPPLY — 12 items
CATH INFINITI 5FR JK (CATHETERS) ×2 IMPLANT
COVER PRB 48X5XTLSCP FOLD TPE (BAG) ×1 IMPLANT
COVER PROBE 5X48 (BAG) ×2
DEVICE RAD COMP TR BAND LRG (VASCULAR PRODUCTS) ×2 IMPLANT
GUIDEWIRE INQWIRE 1.5J.035X260 (WIRE) ×1 IMPLANT
INQWIRE 1.5J .035X260CM (WIRE) ×2
KIT HEART LEFT (KITS) ×2 IMPLANT
PACK CARDIAC CATHETERIZATION (CUSTOM PROCEDURE TRAY) ×2 IMPLANT
SHEATH RAIN RADIAL 21G 6FR (SHEATH) ×2 IMPLANT
SYR MEDRAD MARK V 150ML (SYRINGE) IMPLANT
TRANSDUCER W/STOPCOCK (MISCELLANEOUS) ×2 IMPLANT
TUBING CIL FLEX 10 FLL-RA (TUBING) ×2 IMPLANT

## 2018-01-26 NOTE — Interval H&P Note (Signed)
Cath Lab Visit (complete for each Cath Lab visit)  Clinical Evaluation Leading to the Procedure:   ACS: No.  Non-ACS:    Anginal Classification: CCS II  Anti-ischemic medical therapy: No Therapy  Non-Invasive Test Results: Intermediate-risk stress test findings: cardiac mortality 1-3%/year  Prior CABG: No previous CABG      History and Physical Interval Note:  01/26/2018 1:24 PM  Rebecca Robertson  has presented today for surgery, with the diagnosis of cp  The various methods of treatment have been discussed with the patient and family. After consideration of risks, benefits and other options for treatment, the patient has consented to  Procedure(s): LEFT HEART CATH AND CORONARY ANGIOGRAPHY (N/A) as a surgical intervention .  The patient's history has been reviewed, patient examined, no change in status, stable for surgery.  I have reviewed the patient's chart and labs.  Questions were answered to the patient's satisfaction.     Kathlyn Sacramento

## 2018-01-26 NOTE — Discharge Instructions (Signed)
Drink plenty of fluids over next 48 hours and keep right wrist elevated at heart level for 24 hours ° °Radial Site Care °Refer to this sheet in the next few weeks. These instructions provide you with information about caring for yourself after your procedure. Your health care provider may also give you more specific instructions. Your treatment has been planned according to current medical practices, but problems sometimes occur. Call your health care provider if you have any problems or questions after your procedure. °What can I expect after the procedure? °After your procedure, it is typical to have the following: °· Bruising at the radial site that usually fades within 1-2 weeks. °· Blood collecting in the tissue (hematoma) that may be painful to the touch. It should usually decrease in size and tenderness within 1-2 weeks. ° °Follow these instructions at home: °· Take medicines only as directed by your health care provider. °· You may shower 24-48 hours after the procedure or as directed by your health care provider. Remove the bandage (dressing) and gently wash the site with plain soap and water. Pat the area dry with a clean towel. Do not rub the site, because this may cause bleeding. °· Do not take baths, swim, or use a hot tub until your health care provider approves. °· Check your insertion site every day for redness, swelling, or drainage. °· Do not apply powder or lotion to the site. °· Do not flex or bend the affected arm for 24 hours or as directed by your health care provider. °· Do not push or pull heavy objects with the affected arm for 24 hours or as directed by your health care provider. °· Do not lift over 10 lb (4.5 kg) for 5 days after your procedure or as directed by your health care provider. °· Ask your health care provider when it is okay to: °? Return to work or school. °? Resume usual physical activities or sports. °? Resume sexual activity. °· Do not drive home if you are discharged the  same day as the procedure. Have someone else drive you. °· You may drive 24 hours after the procedure unless otherwise instructed by your health care provider. °· Do not operate machinery or power tools for 24 hours after the procedure. °· If your procedure was done as an outpatient procedure, which means that you went home the same day as your procedure, a responsible adult should be with you for the first 24 hours after you arrive home. °· Keep all follow-up visits as directed by your health care provider. This is important. °Contact a health care provider if: °· You have a fever. °· You have chills. °· You have increased bleeding from the radial site. Hold pressure on the site. °Get help right away if: °· You have unusual pain at the radial site. °· You have redness, warmth, or swelling at the radial site. °· You have drainage (other than a small amount of blood on the dressing) from the radial site. °· The radial site is bleeding, and the bleeding does not stop after 30 minutes of holding steady pressure on the site. °· Your arm or hand becomes pale, cool, tingly, or numb. °This information is not intended to replace advice given to you by your health care provider. Make sure you discuss any questions you have with your health care provider. °Document Released: 09/05/2010 Document Revised: 01/09/2016 Document Reviewed: 02/19/2014 °Elsevier Interactive Patient Education © 2018 [employer] ° °

## 2018-01-27 ENCOUNTER — Encounter (HOSPITAL_COMMUNITY): Payer: Self-pay | Admitting: Cardiovascular Disease

## 2018-01-27 MED FILL — Verapamil HCl IV Soln 2.5 MG/ML: INTRAVENOUS | Qty: 2 | Status: AC

## 2018-01-27 MED FILL — Heparin Sod (Porcine)-NaCl IV Soln 1000 Unit/500ML-0.9%: INTRAVENOUS | Qty: 1000 | Status: AC

## 2018-01-31 ENCOUNTER — Telehealth: Payer: Self-pay | Admitting: *Deleted

## 2018-01-31 NOTE — Telephone Encounter (Signed)
Appointment made for 7/19 with Dr. Fletcher Anon. Patient verbalized her understanding.

## 2018-01-31 NOTE — Telephone Encounter (Signed)
Message left for the patient to call back. She will need a one month post catheterization appointment with Dr. Fletcher Anon.

## 2018-01-31 NOTE — Telephone Encounter (Signed)
New Message: ° ° ° ° ° ° °Pt is returning a call °

## 2018-02-02 ENCOUNTER — Telehealth: Payer: Self-pay | Admitting: Adult Health

## 2018-02-02 ENCOUNTER — Encounter: Payer: Self-pay | Admitting: Adult Health

## 2018-02-02 ENCOUNTER — Ambulatory Visit (INDEPENDENT_AMBULATORY_CARE_PROVIDER_SITE_OTHER): Payer: Managed Care, Other (non HMO) | Admitting: Adult Health

## 2018-02-02 DIAGNOSIS — L259 Unspecified contact dermatitis, unspecified cause: Secondary | ICD-10-CM | POA: Diagnosis not present

## 2018-02-02 MED ORDER — CLOBETASOL PROPIONATE 0.05 % EX OINT: 1.0000 "application " | TOPICAL_OINTMENT | Freq: Two times a day (BID) | CUTANEOUS | 0 refills | Status: DC

## 2018-02-02 MED ORDER — FLUOCINOLONE ACETONIDE BODY 0.01 % EX OIL: 1.0000 "application " | TOPICAL_OIL | Freq: Two times a day (BID) | CUTANEOUS | 2 refills | Status: DC | PRN

## 2018-02-02 NOTE — Progress Notes (Signed)
Subjective:    Patient ID: Rebecca Robertson, female    DOB: 1955-10-06, 62 y.o.   MRN: [id number]  HPI:  Rebecca Robertson presents with "itchy, red, flaky skin" that developed on bil palms >4 weeks ago.  She has been experiencing these sx's intermittently since 2012.  She was previously seen by multiple dermatologist's and an allergist and it was determined that sx's were caused by contact dermatitis/eczema. She reports pain and itching on palms and has been using Fluocinolone Acetonide Body Oil 0.01% BID the last few days with minimal sx relief. She denies change in laundry detergent She denies recent travel outside [place] She denies starting a new medication recently  Patient Care Team    Relationship Specialty Notifications Start End  Mina Marble D, NP PCP - General Family Medicine  06/01/17   Teena Irani, MD (Inactive) Consulting Physician Gastroenterology  06/02/16   Delrae Rend, MD Consulting Physician Endocrinology  06/02/16   Druscilla Brownie, MD Consulting Physician Dermatology  06/02/16   Nicholas Lose, MD Consulting Physician Hematology and Oncology  06/02/16   Bjorn Loser, MD Consulting Physician Urology  06/02/16   Christain Sacramento, [place] Referring Physician Optometry  05/11/17     Patient Active Problem List   Diagnosis Date Noted  . Abnormal nuclear stress test   . Chest pain 12/07/2017  . Healthcare maintenance 11/11/2017  . Screening for cervical cancer 11/11/2017  . Personal history UTI- 04/07/17, + Cx- txed with abx 05/18/2017  . Urinary frequency 05/18/2017  . Diarrhea in adult patient 05/11/2017  . UTI (urinary tract infection) 04/07/2017  . Dysuria 04/07/2017  . Abnormal urinalysis 04/07/2017  . Cervical smear, as part of routine gynecological examination 11/09/2016  . Pneumonia 09/23/2016  . Depression 05/28/2016  . B12 deficiency 05/28/2016  . SUI (stress urinary incontinence, female) 05/28/2016  . Hypertriglyceridemia 05/28/2016  . Vaginal atrophy  03/25/2016  . h/o Anxiety 03/08/2016  . Hyperlipidemia 03/04/2016  . Vitamin D insufficiency 03/04/2016  . History of breast cancer 03/04/2016  . Contact dermatitis and eczema due to cause 03/04/2016  . Closed fracture of proximal phalanx of toe 04/03/2014  . Diabetes ([place]) 03/27/2014  . Neoplasm of left breast, primary tumor staging category Tis: ductal carcinoma in situ (DCIS) 02/10/2011     Past Medical History:  Diagnosis Date  . Anxiety   . Diabetes mellitus   . History of breast cancer ONCOLOGIST-- DR Humphrey Rolls--  NO RECURRENCE   S/P LEFT PARTIAL MASTECTOMY W/ SLN BX'S  12-02-2010---  DUCTAL CARCINOMA IN SITU--  S/P RADIATION THERAPY ENDED 02-17-2011  . Hyperlipidemia   . Pneumonia   . PONV (postoperative nausea and vomiting)   . SUI (stress urinary incontinence, female)   . Vitamin D deficiency   . Vitamin D insufficiency 03/04/2016     Past Surgical History:  Procedure Laterality Date  . BLADDER SUSPENSION N/A 02/03/2013   Procedure: [facility] PROCEDURE;  Surgeon: Reece Packer, MD;  Location: [facility];  Service: Urology;  Laterality: N/A;  . BUNIONECTOMY    . CESAREAN SECTION     X4  . LEFT HEART CATH AND CORONARY ANGIOGRAPHY N/A 01/26/2018   Procedure: LEFT HEART CATH AND CORONARY ANGIOGRAPHY;  Surgeon: Wellington Hampshire, MD;  Location: [facility];  Service: Cardiovascular;  Laterality: N/A;  . MENISCUS REPAIR Left   . PARTIAL MASTECTOMY WITH AXILLARY SENTINEL LYMPH NODE BIOPSY Left 12-02-2010     Family History  Problem Relation Age of Onset  . Cancer  Mother        lung  . Heart attack Father   . COPD Father   . Heart disease Father      Social History   Substance and Sexual Activity  Drug Use No     Social History   Substance and Sexual Activity  Alcohol Use No     Social History   Tobacco Use  Smoking Status Never Smoker  Smokeless Tobacco Never Used     Outpatient Encounter Medications as of 02/02/2018   Medication Sig  . BAYER CONTOUR NEXT TEST test strip Check blood sugar daily  . citalopram (CELEXA) 20 MG tablet Take 1 tablet (20 mg total) daily by mouth.  . fenofibrate 160 MG tablet TAKE 1 TABLET BY MOUTH DAILY. PATIENT MUST HAVE OFFICE VISIT PRIOR TO ANY FURTHER REFILLS (Patient taking differently: Take 160 mg by mouth every evening. )  . Fluocinolone Acetonide Body 0.01 % OIL Apply 1 application topically 2 (two) times daily as needed.  . loperamide (IMODIUM) 2 MG capsule Take 1 capsule (2 mg total) by mouth as needed for diarrhea or loose stools.  . lovastatin (MEVACOR) 40 MG tablet TAKE 1 TABLET BY MOUTH DAILY AT 8 PM. (Patient taking differently: Take 40 mg by mouth every evening. )  . oxybutynin (DITROPAN) 5 MG tablet TAKE 1 TABLET BY MOUTH 2 TIMES A DAY.  Marland Kitchen Semaglutide (OZEMPIC) 0.25 or 0.5 MG/DOSE SOPN Inject 0.25 mg into the skin every Sunday.  Marland Kitchen SYNJARDY 12.12-998 MG TABS Take 1 tablet 2 (two) times daily by mouth.   . trimethoprim (TRIMPEX) 100 MG tablet Take 100 mg by mouth every evening.   . [DISCONTINUED] Fluocinolone Acetonide Body 0.01 % OIL Apply topically.  . clobetasol ointment (TEMOVATE) 2.50 % Apply 1 application topically 2 (two) times daily.   No facility-administered encounter medications on file as of 02/02/2018.     Allergies: Compazine and Bydureon [exenatide]  Body mass index is 21.1 kg/m.  Blood pressure 107/67, pulse 62, height 5' 4.02" (1.626 m), weight 123 lb (55.8 kg), SpO2 100 %.  Review of Systems  Respiratory: Negative for cough, chest tightness, shortness of breath, wheezing and stridor.   Skin: Positive for color change and rash. Negative for pallor and wound.  Neurological: Negative for dizziness and numbness.  Hematological: Does not bruise/bleed easily.       Objective:   Physical Exam  Constitutional: She is oriented to person, place, and time. She appears well-developed and well-nourished. No distress.  HENT:  Head: Normocephalic  and atraumatic.  Right Ear: External ear normal.  Left Ear: External ear normal.  Neurological: She is alert and oriented to person, place, and time.  Skin: Skin is warm and dry. Capillary refill takes less than 2 seconds. No abrasion, no bruising, no burn, no laceration, no petechiae and no rash noted. She is not diaphoretic. There is erythema. No pallor.  Erythema, dry flaking skin noted on R 4th/5th fingers and L 5th fingers on palmer surface. Small area of open skin on R 4th finger- distal tip. No drainage or streaking noted  Psychiatric: She has a normal mood and affect. Her behavior is normal. Judgment and thought content normal.      Assessment & Plan:   1. Contact dermatitis, unspecified contact dermatitis type, unspecified trigger    Contact dermatitis and eczema due to cause Please use clobetasol cream as directed. If symptoms do not improve in 2 weeks, please call clinic and we will refer you to  dermatology.  FOLLOW-UP:  Return if symptoms worsen or fail to improve.

## 2018-02-02 NOTE — Telephone Encounter (Signed)
Dually noted Thanks! Valetta Fuller

## 2018-02-02 NOTE — Patient Instructions (Addendum)

## 2018-02-02 NOTE — Assessment & Plan Note (Signed)
Please use clobetasol cream as directed. If symptoms do not improve in 2 weeks, please call clinic and we will refer you to dermatology.

## 2018-02-02 NOTE — Telephone Encounter (Signed)
Patient was advised to call back with topical cream she was using for hands. She is using [employer] Working [employer] Cream

## 2018-02-21 ENCOUNTER — Encounter: Payer: Self-pay | Admitting: Urology

## 2018-02-21 ENCOUNTER — Ambulatory Visit (INDEPENDENT_AMBULATORY_CARE_PROVIDER_SITE_OTHER): Payer: Managed Care, Other (non HMO) | Admitting: Urology

## 2018-02-21 VITALS — BP 102/60 | HR 66 | Wt 125.8 lb

## 2018-02-21 DIAGNOSIS — N390 Urinary tract infection, site not specified: Secondary | ICD-10-CM

## 2018-02-21 NOTE — Progress Notes (Signed)
02/21/2018 8:44 AM   Rebecca Robertson Rebecca Robertson 09-Jul-1956 [id number]  Referring provider: Esaw Grandchild, NP [place], [place] [address]  Chief Complaint  Patient presents with  . Follow-up    HPI: The patient has had 2 urinary tract infection since August with cystitis symptoms and burning that responded favorably to antibiotics. In the last few months she has noticed a little bit of pink when she wipes and pain with intercourse. I performed a sling on her in [place] possibly in 2015.  She does not have stress incontinence. She may have a little bit of urge incontinence. She wears 2 thin pads a day. She has no bedwetting.  Renal ultrasound was normal. Last urine culture was positive.   I placed the patient on trimethoprim.  Because of a history of breast cancer she did not take estrogen.   Patient clinically has not had an infection in her pain with intercourse is gone.    On pelvic examination she had mild meatal stenosis.  She does have some mild vaginal narrowing.  I did not feel or see any obvious sling extrusion.  I was not able to insert the cystoscope because of the mild meatal stenosis.  A 14 French red rubber catheter could be inserted.    Today Recognizing the rare differential of mesh erosion cystoscopy was not performed last visit.  She was doing well on trimethoprim.  Clinically infection free.  She no longer leaks.  Frequency stable.  Clinically not infected.    PMH: Past Medical History:  Diagnosis Date  . Anxiety   . Diabetes mellitus   . History of breast cancer ONCOLOGIST-- DR Humphrey Rolls--  NO RECURRENCE   S/P LEFT PARTIAL MASTECTOMY W/ SLN BX'S  12-02-2010---  DUCTAL CARCINOMA IN SITU--  S/P RADIATION THERAPY ENDED 02-17-2011  . Hyperlipidemia   . Pneumonia   . PONV (postoperative nausea and vomiting)   . SUI (stress urinary incontinence, female)   . Vitamin D deficiency   . Vitamin D insufficiency 03/04/2016    Surgical  History: Past Surgical History:  Procedure Laterality Date  . BLADDER SUSPENSION N/A 02/03/2013   Procedure: [facility] PROCEDURE;  Surgeon: Reece Packer, MD;  Location: [facility];  Service: Urology;  Laterality: N/A;  . BUNIONECTOMY    . CESAREAN SECTION     X4  . LEFT HEART CATH AND CORONARY ANGIOGRAPHY N/A 01/26/2018   Procedure: LEFT HEART CATH AND CORONARY ANGIOGRAPHY;  Surgeon: Wellington Hampshire, MD;  Location: [place] [facility];  Service: Cardiovascular;  Laterality: N/A;  . MENISCUS REPAIR Left   . PARTIAL MASTECTOMY WITH AXILLARY SENTINEL LYMPH NODE BIOPSY Left 12-02-2010    Home Medications:  Allergies as of 02/21/2018      Reactions   Compazine Other (See Comments)   Possible seizure, unable to talk, eyes rolled toward back of head, mouth became dry.   Bydureon [exenatide]    Severe burning with injection      Medication List        Accurate as of 02/21/18  8:44 AM. Always use your most recent med list.          BAYER CONTOUR NEXT TEST test strip Generic drug:  glucose blood Check blood sugar daily   citalopram 20 MG tablet Commonly known as:  CELEXA Take 1 tablet (20 mg total) daily by mouth.   clobetasol ointment 0.05 % Commonly known as:  TEMOVATE Apply 1 application topically 2 (two) times daily.  fenofibrate 160 MG tablet TAKE 1 TABLET BY MOUTH DAILY. PATIENT MUST HAVE OFFICE VISIT PRIOR TO ANY FURTHER REFILLS   Fluocinolone Acetonide Body 0.01 % Oil Apply 1 application topically 2 (two) times daily as needed.   lovastatin 40 MG tablet Commonly known as:  MEVACOR TAKE 1 TABLET BY MOUTH DAILY AT 8 PM.   oxybutynin 5 MG tablet Commonly known as:  DITROPAN TAKE 1 TABLET BY MOUTH 2 TIMES A DAY.   OZEMPIC 0.25 or 0.5 MG/DOSE Sopn Generic drug:  Semaglutide Inject 0.25 mg into the skin every Sunday.   SYNJARDY 12.12-998 MG Tabs Generic drug:  Empagliflozin-metFORMIN HCl Take 1 tablet 2 (two) times daily by mouth.   trimethoprim  100 MG tablet Commonly known as:  TRIMPEX Take 100 mg by mouth every evening.       Allergies:  Allergies  Allergen Reactions  . Compazine Other (See Comments)    Possible seizure, unable to talk, eyes rolled toward back of head, mouth became dry.  [name] [Exenatide]     Severe burning with injection    Family History: Family History  Problem Relation Age of Onset  . Cancer Mother        lung  . Heart attack Father   . COPD Father   . Heart disease Father     Social History:  reports that she has never smoked. She has never used smokeless tobacco. She reports that she does not drink alcohol or use drugs.  ROS: UROLOGY Frequent Urination?: No Hard to postpone urination?: No Burning/pain with urination?: No Get up at night to urinate?: No Leakage of urine?: No Urine stream starts and stops?: No Trouble starting stream?: No Do you have to strain to urinate?: No Blood in urine?: No Urinary tract infection?: No Sexually transmitted disease?: No Injury to kidneys or bladder?: No Painful intercourse?: No Weak stream?: No Currently pregnant?: No Vaginal bleeding?: No Last menstrual period?: n  Gastrointestinal Nausea?: No Vomiting?: No Indigestion/heartburn?: No Diarrhea?: No Constipation?: No  Constitutional Fever: No Night sweats?: No Weight loss?: No Fatigue?: No  Skin Skin rash/lesions?: No Itching?: No  Eyes Blurred vision?: No Double vision?: No  Ears/Nose/Throat Sore throat?: No Sinus problems?: No  Hematologic/Lymphatic Swollen glands?: No Easy bruising?: No  Cardiovascular Leg swelling?: No Chest pain?: No  Respiratory Cough?: No Shortness of breath?: No  Endocrine Excessive thirst?: No  Musculoskeletal Back pain?: No Joint pain?: No  Neurological Headaches?: No Dizziness?: No  Psychologic Depression?: No Anxiety?: No  Physical Exam: BP 102/60   Pulse 66   Wt 125 lb 12.8 oz (57.1 kg)   BMI 21.58 kg/m    Constitutional:  Alert and oriented, No acute distress.   Laboratory Data: Lab Results  Component Value Date   WBC 5.5 01/25/2018   HGB 12.9 01/25/2018   HCT 38.8 01/25/2018   MCV 88 01/25/2018   PLT 324 01/25/2018    Lab Results  Component Value Date   CREATININE 0.76 01/25/2018    No results found for: PSA  No results found for: TESTOSTERONE  Lab Results  Component Value Date   HGBA1C 6.4 (H) 11/11/2017    Urinalysis    Component Value Date/Time   APPEARANCEUR Clear 08/23/2017 0852   GLUCOSEU 3+ (A) 08/23/2017 0852   BILIRUBINUR Negative 08/23/2017 0852   PROTEINUR Negative 08/23/2017 0852   UROBILINOGEN 1.0 05/18/2017 1035   NITRITE Negative 08/23/2017 0852   LEUKOCYTESUR Negative 08/23/2017 0852    Pertinent Imaging:   Assessment &  Plan: The patient is doing very well on daily trimethoprim.  Her bladder down regulated and she currently is continent.  Reassess in 1 year.  Prescription with 90 tablets and 3 refills renewed  There are no diagnoses linked to this encounter.  No follow-ups on file.  Reece Packer, MD  [facility] [address] [place], [place] [address] [phone]

## 2018-03-04 ENCOUNTER — Ambulatory Visit (INDEPENDENT_AMBULATORY_CARE_PROVIDER_SITE_OTHER): Payer: Managed Care, Other (non HMO) | Admitting: Cardiovascular Disease

## 2018-03-04 ENCOUNTER — Encounter: Payer: Self-pay | Admitting: Cardiovascular Disease

## 2018-03-04 VITALS — BP 100/60 | HR 55 | Ht 63.0 in | Wt 123.2 lb

## 2018-03-04 DIAGNOSIS — R0789 Other chest pain: Secondary | ICD-10-CM

## 2018-03-04 DIAGNOSIS — E785 Hyperlipidemia, unspecified: Secondary | ICD-10-CM | POA: Diagnosis not present

## 2018-03-04 NOTE — Patient Instructions (Signed)
Medication Instructions: Your physician recommends that you continue on your current medications as directed. Please refer to the Current Medication list given to you today.  If you need a refill on your cardiac medications before your next appointment, please call your pharmacy.    Follow-Up: Your physician wants you to follow-up as needed with Dr. Fletcher Anon.   Thank you for choosing [facility] at [facility]!

## 2018-03-04 NOTE — Progress Notes (Signed)
Cardiology Office Note   Date:  03/04/2018   ID:  [name], DOB Nov 05, 1955, MRN [id number]  PCP:  Esaw Grandchild, NP  Cardiologist:   Kathlyn Sacramento, MD   Chief Complaint  Patient presents with  . other    F/u post cardiac cath. Meds reviewed verbally with pt.      History of Present Illness: Rebecca Robertson is a 62 y.o. female who is here today for follow-up visit regarding atypical chest pain and recent cardiac catheterization.    She has prolonged history of type 2 diabetes, hyperlipidemia and previous breast cancer treated with left mastectomy and radiation therapy.  She is a lifelong non-smoker.  She does have family history of coronary artery disease.  Her father died at the age of 66 of myocardial infarction but he was a smoker and a diabetic. She was seen recently for atypical chest pain.  She underwent a pharmacologic nuclear stress test which was suggestive of anterior wall defect with possible previous infarct. I proceeded with cardiac catheterization via the right radial artery which showed minimal irregularities affecting the LAD with no evidence of obstructive CAD.  EF was normal with normal left ventricular end-diastolic pressure.  She is doing well with no recurrent symptoms.   Past Medical History:  Diagnosis Date  . Anxiety   . Diabetes mellitus   . History of breast cancer ONCOLOGIST-- DR Humphrey Rolls--  NO RECURRENCE   S/P LEFT PARTIAL MASTECTOMY W/ SLN BX'S  12-02-2010---  DUCTAL CARCINOMA IN SITU--  S/P RADIATION THERAPY ENDED 02-17-2011  . Hyperlipidemia   . Pneumonia   . PONV (postoperative nausea and vomiting)   . SUI (stress urinary incontinence, female)   . Vitamin D deficiency   . Vitamin D insufficiency 03/04/2016    Past Surgical History:  Procedure Laterality Date  . BLADDER SUSPENSION N/A 02/03/2013   Procedure: [facility] PROCEDURE;  Surgeon: Reece Packer, MD;  [facility];  Service: Urology;  Laterality:  N/A;  . BUNIONECTOMY    . CESAREAN SECTION     X4  . LEFT HEART CATH AND CORONARY ANGIOGRAPHY N/A 01/26/2018   Procedure: LEFT HEART CATH AND CORONARY ANGIOGRAPHY;  Surgeon: Wellington Hampshire, MD;  Location: [place] [facility];  Service: Cardiovascular;  Laterality: N/A;  . MENISCUS REPAIR Left   . PARTIAL MASTECTOMY WITH AXILLARY SENTINEL LYMPH NODE BIOPSY Left 12-02-2010     Current Outpatient Medications  Medication Sig Dispense Refill  . BAYER CONTOUR NEXT TEST test strip Check blood sugar daily  5  . citalopram (CELEXA) 20 MG tablet Take 1 tablet (20 mg total) daily by mouth. 90 tablet 2  . clobetasol ointment (TEMOVATE) 8.92 % Apply 1 application topically 2 (two) times daily. 30 g 0  . fenofibrate 160 MG tablet TAKE 1 TABLET BY MOUTH DAILY. PATIENT MUST HAVE OFFICE VISIT PRIOR TO ANY FURTHER REFILLS (Patient taking differently: Take 160 mg by mouth every evening. ) 90 tablet 2  . Fluocinolone Acetonide Body 0.01 % OIL Apply 1 application topically 2 (two) times daily as needed. 4 mL 2  . lovastatin (MEVACOR) 40 MG tablet TAKE 1 TABLET BY MOUTH DAILY AT 8 PM. (Patient taking differently: Take 40 mg by mouth every evening. ) 90 tablet 2  . oxybutynin (DITROPAN) 5 MG tablet TAKE 1 TABLET BY MOUTH 2 TIMES A DAY. 180 tablet 1  . Semaglutide (OZEMPIC) 0.25 or 0.5 MG/DOSE SOPN Inject 0.25 mg into the skin every Sunday.    Marland Kitchen  SYNJARDY 12.12-998 MG TABS Take 1 tablet 2 (two) times daily by mouth.   4  . trimethoprim (TRIMPEX) 100 MG tablet Take 100 mg by mouth every evening.   11   No current facility-administered medications for this visit.     Allergies:   Compazine and Bydureon [exenatide]    Social History:  The patient  reports that she has never smoked. She has never used smokeless tobacco. She reports that she does not drink alcohol or use drugs.   Family History:  The patient's family history includes COPD in her father; Cancer in her mother; Heart attack in her father; Heart  disease in her father.    ROS:  Please see the history of present illness.   Otherwise, review of systems are positive for none.   All other systems are reviewed and negative.    PHYSICAL EXAM: VS:  BP 100/60 (BP Location: Left Arm, Patient Position: Sitting, Cuff Size: Normal)   Pulse (!) 55   Ht 5\' 3"  (1.6 m)   Wt 123 lb 4 oz (55.9 kg)   BMI 21.83 kg/m  , BMI Body mass index is 21.83 kg/m. GEN: Well nourished, well developed, in no acute distress  HEENT: normal  Neck: no JVD, carotid bruits, or masses Cardiac: RRR; no murmurs, rubs, or gallops,no edema  Respiratory:  clear to auscultation bilaterally, normal work of breathing GI: soft, nontender, nondistended, + BS MS: no deformity or atrophy  Skin: warm and dry, no rash Neuro:  Strength and sensation are intact Psych: euthymic mood, full affect Right radial pulses normal with no hematoma  EKG:  EKG is ordered today. The ekg ordered today demonstrates sinus bradycardia with no significant ST or T wave changes..   Recent Labs: 11/11/2017: ALT 19; TSH 1.360 01/25/2018: BUN 17; Creatinine, Ser 0.76; Hemoglobin 12.9; Platelets 324; Potassium 4.8; Sodium 140    Lipid Panel    Component Value Date/Time   CHOL 132 11/11/2017 0848   TRIG 61 11/11/2017 0848   HDL 48 11/11/2017 0848   CHOLHDL 2.8 11/11/2017 0848   CHOLHDL 2.2 05/28/2016 1140   VLDL 23 05/28/2016 1140   LDLCALC 72 11/11/2017 0848      Wt Readings from Last 3 Encounters:  03/04/18 123 lb 4 oz (55.9 kg)  02/21/18 125 lb 12.8 oz (57.1 kg)  02/02/18 123 lb (55.8 kg)       PAD Screen 01/07/2018  Previous PAD dx? No  Previous surgical procedure? No  Pain with walking? No  Feet/toe relief with dangling? No  Painful, non-healing ulcers? No  Extremities discolored? No      ASSESSMENT AND PLAN:  1.  Atypical chest pain resolved completely.  Recent cardiac catheterization showed minimal irregularities in the LAD with no obstructive disease.  The nuclear  stress test was falsely positive.  Continue treatment of risk factors.  2.  Hyperlipidemia: Currently on lovastatin.  Most recent LDL was 72.    Disposition:   FU with me as needed.   Signed,  Kathlyn Sacramento, MD  03/04/2018 10:26 AM    [place]

## 2018-03-14 ENCOUNTER — Other Ambulatory Visit: Payer: Self-pay

## 2018-03-14 ENCOUNTER — Telehealth: Payer: Self-pay | Admitting: Urology

## 2018-03-14 DIAGNOSIS — N390 Urinary tract infection, site not specified: Secondary | ICD-10-CM

## 2018-03-14 LAB — MICROALBUMIN, URINE: MICROALB UR: 1.01

## 2018-03-14 LAB — VITAMIN B12: VITAMIN B 12: 108

## 2018-03-14 NOTE — Telephone Encounter (Signed)
Patient called the office with complaint of blood in urine and severe burning with intercourse.  Transferred call to the triage nurse of the day, Gerald Stabs.

## 2018-03-15 ENCOUNTER — Other Ambulatory Visit: Payer: Managed Care, Other (non HMO)

## 2018-03-15 DIAGNOSIS — N390 Urinary tract infection, site not specified: Secondary | ICD-10-CM

## 2018-03-15 LAB — URINALYSIS, COMPLETE
Bilirubin, UA: NEGATIVE
KETONES UA: NEGATIVE
Leukocytes, UA: NEGATIVE
Nitrite, UA: NEGATIVE
PROTEIN UA: NEGATIVE
SPEC GRAV UA: 1.02 (ref 1.005–1.030)
Urobilinogen, Ur: 0.2 mg/dL (ref 0.2–1.0)
pH, UA: 6 (ref 5.0–7.5)

## 2018-03-15 LAB — MICROSCOPIC EXAMINATION

## 2018-03-18 ENCOUNTER — Telehealth: Payer: Self-pay | Admitting: Urology

## 2018-03-18 LAB — CULTURE, URINE COMPREHENSIVE

## 2018-03-18 NOTE — Telephone Encounter (Signed)
Pt called office today asking for u/a and culture results.  She came in to office and left urine sample on Tuesday 7/30.  Please give pt a call [phone]

## 2018-03-18 NOTE — Telephone Encounter (Signed)
Culture negative, please schedule an appointment with Dr. Matilde Sprang.

## 2018-03-21 ENCOUNTER — Ambulatory Visit (INDEPENDENT_AMBULATORY_CARE_PROVIDER_SITE_OTHER): Payer: Managed Care, Other (non HMO) | Admitting: Urology

## 2018-03-21 ENCOUNTER — Encounter: Payer: Self-pay | Admitting: Urology

## 2018-03-21 VITALS — BP 115/68 | HR 60 | Ht 63.0 in | Wt 125.7 lb

## 2018-03-21 DIAGNOSIS — N390 Urinary tract infection, site not specified: Secondary | ICD-10-CM | POA: Diagnosis not present

## 2018-03-21 DIAGNOSIS — R300 Dysuria: Secondary | ICD-10-CM | POA: Diagnosis not present

## 2018-03-21 LAB — URINALYSIS, COMPLETE
Bilirubin, UA: NEGATIVE
Ketones, UA: NEGATIVE
Leukocytes, UA: NEGATIVE
Nitrite, UA: NEGATIVE
Protein, UA: NEGATIVE
Specific Gravity, UA: 1.02 (ref 1.005–1.030)
Urobilinogen, Ur: 0.2 mg/dL (ref 0.2–1.0)
pH, UA: 6.5 (ref 5.0–7.5)

## 2018-03-21 LAB — MICROSCOPIC EXAMINATION

## 2018-03-21 NOTE — Progress Notes (Signed)
03/21/2018 8:48 AM   Rebecca Robertson [name] March 30, 1956 [id number]  Referring provider: Esaw Grandchild, NP [place], [place] [address]  Chief Complaint  Patient presents with  . Recurrent UTI    HPI: The patient was clinically doing very well on daily trimethoprim.  No infections by history.  In the last 10 days or so she was having some burning with intercourse and then some pink when she would wipe afterwards.  She has breast cancer and cannot use estrogen cream.  She uses K-Y jelly for vaginal dryness.  She does not have a history of gross hematuria.  She is on Omnicef for a skin rash.  She is continent.  When I examined her in the past she did not have a sling extrusion.  She never had cystoscopy because of mild meatal stenosis.  Clinically she did not have mesh erosion in the bladder or urethra.  Modifying factors: There are no other modifying factors  Associated signs and symptoms: There are no other associated signs and symptoms Aggravating and relieving factors: There are no other aggravating or relieving factors Severity: Moderate Duration: Persistent  Pelvic examination the patient has a narrow introitus.  She has no prolapse.  She had grade 1 hypermobility of the bladder neck and negative cough test.  I could examine her thoroughly and could not feel or see any sling extrusion.  She is very petite.  I could feel beneath the vaginal epithelium mesh at the left urethrovesical angle but with a Q-tip I could not see any or feel any extrusion.  I used a Q-tip.  I was very pleased with the examination.   PMH: Past Medical History:  Diagnosis Date  . Anxiety   . Diabetes mellitus   . History of breast cancer ONCOLOGIST-- DR Humphrey Rolls--  NO RECURRENCE   S/P LEFT PARTIAL MASTECTOMY W/ SLN BX'S  12-02-2010---  DUCTAL CARCINOMA IN SITU--  S/P RADIATION THERAPY ENDED 02-17-2011  . Hyperlipidemia   . Pneumonia   . PONV (postoperative nausea and vomiting)   . SUI (stress  urinary incontinence, female)   . Vitamin D deficiency   . Vitamin D insufficiency 03/04/2016    Surgical History: Past Surgical History:  Procedure Laterality Date  . BLADDER SUSPENSION N/A 02/03/2013   Procedure: [facility] PROCEDURE;  Surgeon: Reece Packer, MD;  Location: [facility];  Service: Urology;  Laterality: N/A;  . BUNIONECTOMY    . CESAREAN SECTION     X4  . LEFT HEART CATH AND CORONARY ANGIOGRAPHY N/A 01/26/2018   Procedure: LEFT HEART CATH AND CORONARY ANGIOGRAPHY;  Surgeon: Wellington Hampshire, MD;  Location: [place] [facility];  Service: Cardiovascular;  Laterality: N/A;  . MENISCUS REPAIR Left   . PARTIAL MASTECTOMY WITH AXILLARY SENTINEL LYMPH NODE BIOPSY Left 12-02-2010    Home Medications:  Allergies as of 03/21/2018      Reactions   Compazine Other (See Comments)   Possible seizure, unable to talk, eyes rolled toward back of head, mouth became dry.   Bydureon [exenatide]    Severe burning with injection      Medication List        Accurate as of 03/21/18  8:48 AM. Always use your most recent med list.          BAYER CONTOUR NEXT TEST test strip Generic drug:  glucose blood Check blood sugar daily   cefdinir 300 MG capsule Commonly known as:  OMNICEF Take 300 mg by mouth  2 (two) times daily.   citalopram 20 MG tablet Commonly known as:  CELEXA Take 1 tablet (20 mg total) daily by mouth.   clobetasol ointment 0.05 % Commonly known as:  TEMOVATE Apply 1 application topically 2 (two) times daily.   fenofibrate 160 MG tablet TAKE 1 TABLET BY MOUTH DAILY. PATIENT MUST HAVE OFFICE VISIT PRIOR TO ANY FURTHER REFILLS   Fluocinolone Acetonide Body 0.01 % Oil Apply 1 application topically 2 (two) times daily as needed.   lovastatin 40 MG tablet Commonly known as:  MEVACOR TAKE 1 TABLET BY MOUTH DAILY AT 8 PM.   oxybutynin 5 MG tablet Commonly known as:  DITROPAN TAKE 1 TABLET BY MOUTH 2 TIMES A DAY.   OZEMPIC 0.25 or 0.5 MG/DOSE  Sopn Generic drug:  Semaglutide Inject 0.25 mg into the skin every Sunday.   SYNJARDY 12.12-998 MG Tabs Generic drug:  Empagliflozin-metFORMIN HCl Take 1 tablet 2 (two) times daily by mouth.   trimethoprim 100 MG tablet Commonly known as:  TRIMPEX Take 100 mg by mouth every evening.       Allergies:  Allergies  Allergen Reactions  . Compazine Other (See Comments)    Possible seizure, unable to talk, eyes rolled toward back of head, mouth became dry.  [name] [Exenatide]     Severe burning with injection    Family History: Family History  Problem Relation Age of Onset  . Cancer Mother        lung  . Heart attack Father   . COPD Father   . Heart disease Father     Social History:  reports that she has never smoked. She has never used smokeless tobacco. She reports that she does not drink alcohol or use drugs.  ROS: UROLOGY Frequent Urination?: No Hard to postpone urination?: No Burning/pain with urination?: No Get up at night to urinate?: No Leakage of urine?: No Urine stream starts and stops?: No Trouble starting stream?: No Do you have to strain to urinate?: No Blood in urine?: No Urinary tract infection?: No Sexually transmitted disease?: No Injury to kidneys or bladder?: No Painful intercourse?: No Weak stream?: No Currently pregnant?: No Vaginal bleeding?: No Last menstrual period?: n  Gastrointestinal Nausea?: No Vomiting?: No Indigestion/heartburn?: No Diarrhea?: No Constipation?: No  Constitutional Fever: No Night sweats?: No Weight loss?: No Fatigue?: No  Skin Skin rash/lesions?: No Itching?: No  Eyes Blurred vision?: No Double vision?: No  Ears/Nose/Throat Sore throat?: No Sinus problems?: No  Hematologic/Lymphatic Swollen glands?: No Easy bruising?: No  Cardiovascular Leg swelling?: No Chest pain?: No  Respiratory Cough?: No Shortness of breath?: No  Endocrine Excessive thirst?: No  Musculoskeletal Back  pain?: No Joint pain?: No  Neurological Headaches?: No Dizziness?: No  Psychologic Depression?: No Anxiety?: No  Physical Exam: BP 115/68 (BP Location: Right Arm, Patient Position: Sitting, Cuff Size: Normal)   Pulse 60   Ht 5\' 3"  (1.6 m)   Wt 125 lb 11.2 oz (57 kg)   BMI 22.27 kg/m   Constitutional:  Alert and oriented, No acute distress.   Laboratory Data: Lab Results  Component Value Date   WBC 5.5 01/25/2018   HGB 12.9 01/25/2018   HCT 38.8 01/25/2018   MCV 88 01/25/2018   PLT 324 01/25/2018    Lab Results  Component Value Date   CREATININE 0.76 01/25/2018    No results found for: PSA  No results found for: TESTOSTERONE  Lab Results  Component Value Date   HGBA1C 6.4 (H)  11/11/2017    Urinalysis    Component Value Date/Time   APPEARANCEUR Clear 03/15/2018 0947   GLUCOSEU 3+ (A) 03/15/2018 0947   BILIRUBINUR Negative 03/15/2018 0947   PROTEINUR Negative 03/15/2018 0947   UROBILINOGEN 1.0 05/18/2017 1035   NITRITE Negative 03/15/2018 0947   LEUKOCYTESUR Negative 03/15/2018 0947    Pertinent Imaging:   Assessment & Plan: Reassurance given.  She is petite.  She has some vaginal dryness.  I think this is the cause of the pink following intercourse.  She was having some burning.  I believe the symptoms associated with the same diagnosis.  Her breast cancer was in 2012.  Recommended to see her gynecologist for possible vaginal rejuvenation treatments that are not estrogen.  Perhaps she could speak to her medical oncologist regarding receptor type and safety of estrogen.  Burning and pink is only after intercourse.  No microscopic hematuria today  There are no diagnoses linked to this encounter.  No follow-ups on file.  Reece Packer, MD  [facility] [address], [place], [place] [address] [phone]

## 2018-03-28 ENCOUNTER — Ambulatory Visit (INDEPENDENT_AMBULATORY_CARE_PROVIDER_SITE_OTHER): Payer: Managed Care, Other (non HMO) | Admitting: Adult Health

## 2018-03-28 ENCOUNTER — Encounter: Payer: Self-pay | Admitting: Adult Health

## 2018-03-28 VITALS — BP 115/71 | HR 59 | Ht 63.0 in | Wt 122.1 lb

## 2018-03-28 DIAGNOSIS — B373 Candidiasis of vulva and vagina: Secondary | ICD-10-CM

## 2018-03-28 DIAGNOSIS — B3731 Acute candidiasis of vulva and vagina: Secondary | ICD-10-CM | POA: Insufficient documentation

## 2018-03-28 MED ORDER — FLUCONAZOLE 150 MG PO TABS: 150.0000 mg | ORAL_TABLET | Freq: Every day | ORAL | 1 refills | Status: DC

## 2018-03-28 NOTE — Patient Instructions (Signed)
Vaginal Yeast infection, Adult Vaginal yeast infection is a condition that causes soreness, swelling, and redness (inflammation) of the vagina. It also causes vaginal discharge. This is a common condition. Some women get this infection frequently. What are the causes? This condition is caused by a change in the normal balance of the yeast (candida) and bacteria that live in the vagina. This change causes an overgrowth of yeast, which causes the inflammation. What increases the risk? This condition is more likely to develop in:  Women who take antibiotic medicines.  Women who have diabetes.  Women who take birth control pills.  Women who are pregnant.  Women who douche often.  Women who have a weak defense (immune) system.  Women who have been taking steroid medicines for a long time.  Women who frequently wear tight clothing.  What are the signs or symptoms? Symptoms of this condition include:  White, thick vaginal discharge.  Swelling, itching, redness, and irritation of the vagina. The lips of the vagina (vulva) may be affected as well.  Pain or a burning feeling while urinating.  Pain during sex.  How is this diagnosed? This condition is diagnosed with a medical history and physical exam. This will include a pelvic exam. Your health care provider will examine a sample of your vaginal discharge under a microscope. Your health care provider may send this sample for testing to confirm the diagnosis. How is this treated? This condition is treated with medicine. Medicines may be over-the-counter or prescription. You may be told to use one or more of the following:  Medicine that is taken orally.  Medicine that is applied as a cream.  Medicine that is inserted directly into the vagina (suppository).  Follow these instructions at home:  Take or apply over-the-counter and prescription medicines only as told by your health care provider.  Do not have sex until your health  care provider has approved. Tell your sex partner that you have a yeast infection. That person should go to his or her health care provider if he or she develops symptoms.  Do not wear tight clothes, such as pantyhose or tight pants.  Avoid using tampons until your health care provider approves.  Eat more yogurt. This may help to keep your yeast infection from returning.  Try taking a sitz bath to help with discomfort. This is a warm water bath that is taken while you are sitting down. The water should only come up to your hips and should cover your buttocks. Do this 3-4 times per day or as told by your health care provider.  Do not douche.  Wear breathable, cotton underwear.  If you have diabetes, keep your blood sugar levels under control. Contact a health care provider if:  You have a fever.  Your symptoms go away and then return.  Your symptoms do not get better with treatment.  Your symptoms get worse.  You have new symptoms.  You develop blisters in or around your vagina.  You have blood coming from your vagina and it is not your menstrual period.  You develop pain in your abdomen. This information is not intended to replace advice given to you by your health care provider. Make sure you discuss any questions you have with your health care provider. Document Released: 05/13/2005 Document Revised: 01/15/2016 Document Reviewed: 02/04/2015 Elsevier Interactive Patient Education  2018 [employer].   Please take Diflucan 150mg  to treat yeast infection, may repeat in 3 days if needed. Please call clinic with  any questions/concerns. FEEL BETTER!

## 2018-03-28 NOTE — Progress Notes (Signed)
Subjective:    Patient ID: Rebecca Robertson, female    DOB: [birth year], 62 y.o.   MRN: [id number]  HPI :  [name] presents with thick/white vaginal d/c that started >1.5 weeks ago. She also reports vaginal itching, dyspareuia, and  "strong odor". She denies abdominal pain/hematuria/hematochezia She denies urinary frequency/dysuria  She takes Trimethoprim 100mg  QD for chronic UTI  Patient Care Team    Relationship Specialty Notifications Start End  Rebecca Robertson D, NP PCP - General Family Medicine  06/01/17   Rebecca Irani, MD (Inactive) Consulting Physician Gastroenterology  06/02/16   Rebecca Rend, MD Consulting Physician Endocrinology  06/02/16   Rebecca Brownie, MD Consulting Physician Dermatology  06/02/16   Rebecca Lose, MD Consulting Physician Hematology and Oncology  06/02/16   Rebecca Loser, MD Consulting Physician Urology  06/02/16   Rebecca Robertson, [place] Referring Physician Optometry  05/11/17     Patient Active Problem List   Diagnosis Date Noted  . Vaginal yeast infection 03/28/2018  . Abnormal nuclear stress test   . Chest pain 12/07/2017  . Healthcare maintenance 11/11/2017  . Screening for cervical cancer 11/11/2017  . Personal history UTI- 04/07/17, + Cx- txed with abx 05/18/2017  . Urinary frequency 05/18/2017  . Diarrhea in adult patient 05/11/2017  . UTI (urinary tract infection) 04/07/2017  . Dysuria 04/07/2017  . Abnormal urinalysis 04/07/2017  . Cervical smear, as part of routine gynecological examination 11/09/2016  . Pneumonia 09/23/2016  . Depression 05/28/2016  . B12 deficiency 05/28/2016  . SUI (stress urinary incontinence, female) 05/28/2016  . Hypertriglyceridemia 05/28/2016  . Vaginal atrophy 03/25/2016  . h/o Anxiety 03/08/2016  . Hyperlipidemia 03/04/2016  . Vitamin D insufficiency 03/04/2016  . History of breast cancer 03/04/2016  . Contact dermatitis and eczema due to cause 03/04/2016  . Closed fracture of proximal phalanx of  toe 04/03/2014  . Diabetes ([place]) 03/27/2014  . Neoplasm of left breast, primary tumor staging category Tis: ductal carcinoma in situ (DCIS) 02/10/2011     Past Medical History:  Diagnosis Date  . Anxiety   . Diabetes mellitus   . History of breast cancer ONCOLOGIST-- DR Rebecca Robertson--  NO RECURRENCE   S/P LEFT PARTIAL MASTECTOMY W/ SLN BX'S  12-02-2010---  DUCTAL CARCINOMA IN SITU--  S/P RADIATION THERAPY ENDED 02-17-2011  . Hyperlipidemia   . Pneumonia   . PONV (postoperative nausea and vomiting)   . SUI (stress urinary incontinence, female)   . Vitamin D deficiency   . Vitamin D insufficiency 03/04/2016     Past Surgical History:  Procedure Laterality Date  . BLADDER SUSPENSION N/A 02/03/2013   Procedure: [facility] PROCEDURE;  Surgeon: Rebecca Packer, MD;  Location: [facility];  Service: Urology;  Laterality: N/A;  . BUNIONECTOMY    . CESAREAN SECTION     X4  . LEFT HEART CATH AND CORONARY ANGIOGRAPHY N/A 01/26/2018   Procedure: LEFT HEART CATH AND CORONARY ANGIOGRAPHY;  Surgeon: Rebecca Hampshire, MD;  Location: [place] [facility];  Service: Cardiovascular;  Laterality: N/A;  . MENISCUS REPAIR Left   . PARTIAL MASTECTOMY WITH AXILLARY SENTINEL LYMPH NODE BIOPSY Left 12-02-2010     Family History  Problem Relation Age of Onset  . Cancer Mother        lung  . Heart attack Father   . COPD Father   . Heart disease Father      Social History   Substance and Sexual Activity  Drug Use No  Social History   Substance and Sexual Activity  Alcohol Use No     Social History   Tobacco Use  Smoking Status Never Smoker  Smokeless Tobacco Never Used     Outpatient Encounter Medications as of 03/28/2018  Medication Sig  . BAYER CONTOUR NEXT TEST test strip Check blood sugar daily  . citalopram (CELEXA) 20 MG tablet Take 1 tablet (20 mg total) daily by mouth.  . clobetasol ointment (TEMOVATE) 0.10 % Apply 1 application topically 2 (two) times daily.  .  fenofibrate 160 MG tablet TAKE 1 TABLET BY MOUTH DAILY. PATIENT MUST HAVE OFFICE VISIT PRIOR TO ANY FURTHER REFILLS (Patient taking differently: Take 160 mg by mouth every evening. )  . Fluocinolone Acetonide Body 0.01 % OIL Apply 1 application topically 2 (two) times daily as needed.  . lovastatin (MEVACOR) 40 MG tablet TAKE 1 TABLET BY MOUTH DAILY AT 8 PM. (Patient taking differently: Take 40 mg by mouth every evening. )  . oxybutynin (DITROPAN) 5 MG tablet TAKE 1 TABLET BY MOUTH 2 TIMES A DAY.  Marland Kitchen Semaglutide (OZEMPIC) 0.25 or 0.5 MG/DOSE SOPN Inject 0.25 mg into the skin every Sunday.  Marland Kitchen SYNJARDY 12.12-998 MG TABS Take 1 tablet 2 (two) times daily by mouth.   . trimethoprim (TRIMPEX) 100 MG tablet Take 100 mg by mouth every evening.   . fluconazole (DIFLUCAN) 150 MG tablet Take 1 tablet (150 mg total) by mouth daily. May repeat dose in 3 days if needed.  . [DISCONTINUED] cefdinir (OMNICEF) 300 MG capsule Take 300 mg by mouth 2 (two) times daily.   No facility-administered encounter medications on file as of 03/28/2018.     Allergies: Compazine and Bydureon [exenatide]  Body mass index is 21.63 kg/m.  Blood pressure 115/71, pulse (!) 59, height 5\' 3"  (1.6 m), weight 122 lb 1.6 oz (55.4 kg), SpO2 98 %.  Review of Systems  Constitutional: Positive for fatigue. Negative for activity change, appetite change, chills, diaphoresis, fever and unexpected weight change.  Respiratory: Negative for cough, chest tightness, shortness of breath, wheezing and stridor.   Cardiovascular: Negative for chest pain, palpitations and leg swelling.  Gastrointestinal: Negative for abdominal distention, abdominal pain, blood in stool, constipation, diarrhea, nausea and vomiting.  Endocrine: Negative for cold intolerance, heat intolerance, polydipsia, polyphagia and polyuria.  Genitourinary: Positive for dyspareunia and vaginal discharge. Negative for difficulty urinating, dysuria, flank pain, frequency, hematuria  and urgency.  Neurological: Negative for dizziness and headaches.  Hematological: Does not bruise/bleed easily.      Objective:   Physical Exam  Constitutional: She appears well-developed and well-nourished. No distress.  HENT:  Head: Normocephalic and atraumatic.  Right Ear: External ear normal.  Left Ear: External ear normal.  Nose: Nose normal.  Mouth/Throat: Oropharynx is clear and moist.  Eyes: Pupils are equal, round, and reactive to light. Conjunctivae and EOM are normal.  Cardiovascular: Normal rate, regular rhythm, normal heart sounds and intact distal pulses.  No murmur heard. Pulmonary/Chest: Effort normal and breath sounds normal. No stridor. No respiratory distress. She has no wheezes. She has no rales. She exhibits no tenderness.  Abdominal: Soft. Bowel sounds are normal. She exhibits no distension and no mass. There is no tenderness. There is no rebound, no guarding and no CVA tenderness. No hernia.  Skin: Skin is warm and dry. Capillary refill takes less than 2 seconds. No rash noted. She is not diaphoretic. No erythema. There is pallor.  Pale is her baseline   Psychiatric: She has a  normal mood and affect. Her behavior is normal. Judgment and thought content normal.      Assessment & Plan:   1. Vaginal yeast infection     Vaginal yeast infection Please take Diflucan 150mg  to treat yeast infection, may repeat in 3 days if needed. Please call clinic with any questions/concerns.    FOLLOW-UP:  Return if symptoms worsen or fail to improve.

## 2018-03-28 NOTE — Assessment & Plan Note (Signed)
Please take Diflucan 150mg  to treat yeast infection, may repeat in 3 days if needed. Please call clinic with any questions/concerns.

## 2018-04-05 ENCOUNTER — Other Ambulatory Visit: Payer: Self-pay | Admitting: Adult Health

## 2018-04-20 ENCOUNTER — Other Ambulatory Visit: Payer: Self-pay | Admitting: Adult Health

## 2018-04-20 DIAGNOSIS — F419 Anxiety disorder, unspecified: Secondary | ICD-10-CM

## 2018-04-20 DIAGNOSIS — F32A Depression, unspecified: Secondary | ICD-10-CM

## 2018-04-20 DIAGNOSIS — F329 Major depressive disorder, single episode, unspecified: Secondary | ICD-10-CM

## 2018-06-08 ENCOUNTER — Telehealth: Payer: Self-pay

## 2018-06-08 ENCOUNTER — Telehealth: Payer: Self-pay | Admitting: *Deleted

## 2018-06-08 NOTE — Telephone Encounter (Signed)
Will give her a call. Thank you.

## 2018-06-08 NOTE — Telephone Encounter (Signed)
Patient calling to say her ob-gyn would like for her to start [place] for vaginal dryness. She was told to run this by her oncologist, before starting. She has seen dr Lindi Adie in the past. Please call her at [phone]

## 2018-06-08 NOTE — Telephone Encounter (Signed)
Attempted to call pt but phone was not available at this time. Attempted to call x2 with no success.   Per Dr.Gudena's recommendation, pt advised to not take Osphena for now, due to contraindication for known or suspected estrogen- dependent neoplasia, which pt has DCIS.

## 2018-06-20 NOTE — Progress Notes (Signed)
Subjective:    Patient ID: Rebecca Robertson, female    DOB: 10/10/1955, 62 y.o.   MRN: [id number]  HPI:  [name] presents with productive cough (thick/clear mucus) and scant clear nasal drainage, and lower back pain present the last 48 hrs. She also reports intermittent wheezing. She denies CP/palpitations/N/V/D/fever/night sweats/chills. She reports her husband was treated for flu (negative flu test) a few weeks ago. She continue to abstain from tobacco use  Patient Care Team    Relationship Specialty Notifications Start End  Mina Marble D, NP PCP - General Family Medicine  06/01/17   Teena Irani, MD (Inactive) Consulting Physician Gastroenterology  06/02/16   Delrae Rend, MD Consulting Physician Endocrinology  06/02/16   Druscilla Brownie, MD Consulting Physician Dermatology  06/02/16   Nicholas Lose, MD Consulting Physician Hematology and Oncology  06/02/16   Bjorn Loser, MD Consulting Physician Urology  06/02/16   Christain Sacramento, [place] Referring Physician Optometry  05/11/17     Patient Active Problem List   Diagnosis Date Noted  . Cough in adult 06/21/2018  . Vaginal yeast infection 03/28/2018  . Abnormal nuclear stress test   . Chest pain 12/07/2017  . Healthcare maintenance 11/11/2017  . Screening for cervical cancer 11/11/2017  . Personal history UTI- 04/07/17, + Cx- txed with abx 05/18/2017  . Urinary frequency 05/18/2017  . Diarrhea in adult patient 05/11/2017  . UTI (urinary tract infection) 04/07/2017  . Dysuria 04/07/2017  . Abnormal urinalysis 04/07/2017  . Cervical smear, as part of routine gynecological examination 11/09/2016  . Pneumonia 09/23/2016  . Depression 05/28/2016  . B12 deficiency 05/28/2016  . SUI (stress urinary incontinence, female) 05/28/2016  . Hypertriglyceridemia 05/28/2016  . Vaginal atrophy 03/25/2016  . h/o Anxiety 03/08/2016  . Hyperlipidemia 03/04/2016  . Vitamin D insufficiency 03/04/2016  . History of breast cancer  03/04/2016  . Contact dermatitis and eczema due to cause 03/04/2016  . Closed fracture of proximal phalanx of toe 04/03/2014  . Diabetes ([place]) 03/27/2014  . Neoplasm of left breast, primary tumor staging category Tis: ductal carcinoma in situ (DCIS) 02/10/2011     Past Medical History:  Diagnosis Date  . Anxiety   . Diabetes mellitus   . History of breast cancer ONCOLOGIST-- DR Humphrey Rolls--  NO RECURRENCE   S/P LEFT PARTIAL MASTECTOMY W/ SLN BX'S  12-02-2010---  DUCTAL CARCINOMA IN SITU--  S/P RADIATION THERAPY ENDED 02-17-2011  . Hyperlipidemia   . Pneumonia   . PONV (postoperative nausea and vomiting)   . SUI (stress urinary incontinence, female)   . Vitamin D deficiency   . Vitamin D insufficiency 03/04/2016     Past Surgical History:  Procedure Laterality Date  . BLADDER SUSPENSION N/A 02/03/2013   Procedure: [facility] PROCEDURE;  Surgeon: Reece Packer, MD;  Location: [facility];  Service: Urology;  Laterality: N/A;  . BUNIONECTOMY    . CESAREAN SECTION     X4  . LEFT HEART CATH AND CORONARY ANGIOGRAPHY N/A 01/26/2018   Procedure: LEFT HEART CATH AND CORONARY ANGIOGRAPHY;  Surgeon: Wellington Hampshire, MD;  Location: [place] [facility];  Service: Cardiovascular;  Laterality: N/A;  . MENISCUS REPAIR Left   . PARTIAL MASTECTOMY WITH AXILLARY SENTINEL LYMPH NODE BIOPSY Left 12-02-2010     Family History  Problem Relation Age of Onset  . Cancer Mother        lung  . Heart attack Father   . COPD Father   . Heart disease Father  Social History   Substance and Sexual Activity  Drug Use No     Social History   Substance and Sexual Activity  Alcohol Use No     Social History   Tobacco Use  Smoking Status Never Smoker  Smokeless Tobacco Never Used     Outpatient Encounter Medications as of 06/21/2018  Medication Sig  . BAYER CONTOUR NEXT TEST test strip Check blood sugar daily  . citalopram (CELEXA) 20 MG tablet TAKE 1 TABLET BY MOUTH DAILY   . clobetasol ointment (TEMOVATE) 9.32 % Apply 1 application topically 2 (two) times daily.  . fenofibrate 160 MG tablet Take 1 tablet (160 mg total) by mouth daily.  . fluconazole (DIFLUCAN) 150 MG tablet Take 1 tablet (150 mg total) by mouth daily. May repeat dose in 3 days if needed.  . Fluocinolone Acetonide Body 0.01 % OIL Apply 1 application topically 2 (two) times daily as needed.  . lovastatin (MEVACOR) 40 MG tablet TAKE 1 TABLET BY MOUTH DAILY AT 8 PM.  . oxybutynin (DITROPAN) 5 MG tablet TAKE 1 TABLET BY MOUTH 2 TIMES A DAY.  Marland Kitchen Semaglutide (OZEMPIC) 0.25 or 0.5 MG/DOSE SOPN Inject 0.25 mg into the skin every Sunday.  Marland Kitchen SYNJARDY 12.12-998 MG TABS Take 1 tablet 2 (two) times daily by mouth.   . trimethoprim (TRIMPEX) 100 MG tablet Take 100 mg by mouth every evening.   Marland Kitchen albuterol (PROVENTIL HFA;VENTOLIN HFA) 108 (90 Base) MCG/ACT inhaler Inhale 2 puffs into the lungs every 6 (six) hours as needed for wheezing or shortness of breath.  . chlorpheniramine-HYDROcodone (TUSSIONEX) 10-8 MG/5ML SUER Take 5 mLs by mouth every 12 (twelve) hours as needed.   Facility-Administered Encounter Medications as of 06/21/2018  Medication  . ipratropium-albuterol (DUONEB) 0.5-2.5 (3) MG/3ML nebulizer solution 3 mL    Allergies: Compazine and Bydureon [exenatide]  Body mass index is 22.11 kg/m.  Blood pressure 107/63, pulse (!) 59, temperature 98 F (36.7 C), temperature source Oral, height 5\' 3"  (1.6 m), weight 124 lb 12.8 oz (56.6 kg), SpO2 100 %.  Review of Systems  Constitutional: Positive for activity change and fatigue. Negative for appetite change, chills, diaphoresis, fever and unexpected weight change.  HENT: Positive for congestion, postnasal drip and voice change. Negative for ear discharge, ear pain and sore throat.   Eyes: Negative for visual disturbance.  Respiratory: Positive for cough and wheezing. Negative for chest tightness and shortness of breath.   Cardiovascular: Negative for  chest pain, palpitations and leg swelling.  Gastrointestinal: Negative for abdominal distention, abdominal pain, blood in stool, constipation, diarrhea, nausea and vomiting.  Endocrine: Negative for cold intolerance, heat intolerance, polydipsia and polyphagia.  Musculoskeletal: Positive for back pain and myalgias.  Neurological: Negative for dizziness and headaches.  Psychiatric/Behavioral: Positive for sleep disturbance.       Objective:   Physical Exam  Constitutional: She appears well-developed and well-nourished. No distress.  HENT:  Head: Normocephalic and atraumatic.  Right Ear: External ear normal. Tympanic membrane is not erythematous and not bulging. No decreased hearing is noted.  Left Ear: External ear normal. Tympanic membrane is not erythematous and not bulging. No decreased hearing is noted.  Nose: Mucosal edema and rhinorrhea present. Right sinus exhibits no maxillary sinus tenderness and no frontal sinus tenderness. Left sinus exhibits no maxillary sinus tenderness and no frontal sinus tenderness.  Mouth/Throat: Mucous membranes are normal. Abnormal dentition. Posterior oropharyngeal edema and posterior oropharyngeal erythema present. No oropharyngeal exudate or tonsillar abscesses. Tonsils are 1+ on the right.  Tonsils are 1+ on the left. No tonsillar exudate.  Eyes: Pupils are equal, round, and reactive to light. Conjunctivae and EOM are normal.  Neck: Normal range of motion. Neck supple.  Cardiovascular: Normal rate, regular rhythm, normal heart sounds and intact distal pulses.  No murmur heard. Pulmonary/Chest: Effort normal. She has no decreased breath sounds. She has wheezes in the right middle field and the left middle field. She has no rhonchi. She has no rales.  Breathing tx given in clinic 95% reduction on wheezing  Lymphadenopathy:    She has no cervical adenopathy.  Skin: Skin is warm and dry. Capillary refill takes less than 2 seconds. No rash noted. She is not  diaphoretic. No erythema. No pallor.  Psychiatric: She has a normal mood and affect. Her behavior is normal. Judgment and thought content normal.  Nursing note and vitals reviewed.     Assessment & Plan:   1. Cough in adult     Cough in adult [place] Controlled Substance Database reviewed- no aberrancies noted Please use ProAir and Tussionex as needed. Increase fluids/rest/vit c-2,000mg /day when not feeling well. Alternate OTC Acetaminophen or Ibuprofen as needed for fever/discomfort. If symptoms continue into next week, please call clinic and we will send in antibiotic (Azithromycin)    FOLLOW-UP:  Return if symptoms worsen or fail to improve.

## 2018-06-21 ENCOUNTER — Ambulatory Visit (INDEPENDENT_AMBULATORY_CARE_PROVIDER_SITE_OTHER): Payer: Managed Care, Other (non HMO) | Admitting: Adult Health

## 2018-06-21 ENCOUNTER — Encounter: Payer: Self-pay | Admitting: Adult Health

## 2018-06-21 VITALS — BP 107/63 | HR 59 | Temp 98.0°F | Ht 63.0 in | Wt 124.8 lb

## 2018-06-21 DIAGNOSIS — R05 Cough: Secondary | ICD-10-CM

## 2018-06-21 DIAGNOSIS — R059 Cough, unspecified: Secondary | ICD-10-CM

## 2018-06-21 MED ORDER — HYDROCOD POLST-CPM POLST ER 10-8 MG/5ML PO SUER: 5.0000 mL | Freq: Two times a day (BID) | ORAL | 0 refills | Status: DC | PRN

## 2018-06-21 MED ORDER — IPRATROPIUM-ALBUTEROL 0.5-2.5 (3) MG/3ML IN SOLN
3.0000 mL | Freq: Four times a day (QID) | RESPIRATORY_TRACT | Status: DC
  Administered 2018-06-21: 3 mL via RESPIRATORY_TRACT

## 2018-06-21 MED ORDER — ALBUTEROL SULFATE HFA 108 (90 BASE) MCG/ACT IN AERS
2.0000 | INHALATION_SPRAY | Freq: Four times a day (QID) | RESPIRATORY_TRACT | 0 refills | Status: DC | PRN
Start: 2018-06-21 — End: 2018-11-24

## 2018-06-21 NOTE — Assessment & Plan Note (Addendum)
[place] Controlled Substance Database reviewed- no aberrancies noted Please use ProAir and Tussionex as needed. Increase fluids/rest/vit c-2,000mg /day when not feeling well. Alternate OTC Acetaminophen or Ibuprofen as needed for fever/discomfort. If symptoms continue into next week, please call clinic and we will send in antibiotic (Azithromycin)

## 2018-06-21 NOTE — Patient Instructions (Addendum)
Cough, Adult Coughing is a reflex that clears your throat and your airways. Coughing helps to heal and protect your lungs. It is normal to cough occasionally, but a cough that happens with other symptoms or lasts a long time may be a sign of a condition that needs treatment. A cough may last only 2-3 weeks (acute), or it may last longer than 8 weeks (chronic). What are the causes? Coughing is commonly caused by:  Breathing in substances that irritate your lungs.  A viral or bacterial respiratory infection.  Allergies.  Asthma.  Postnasal drip.  Smoking.  Acid backing up from the stomach into the esophagus (gastroesophageal reflux).  Certain medicines.  Chronic lung problems, including COPD (or rarely, lung cancer).  Other medical conditions such as heart failure.  Follow these instructions at home: Pay attention to any changes in your symptoms. Take these actions to help with your discomfort:  Take medicines only as told by your health care provider. ? If you were prescribed an antibiotic medicine, take it as told by your health care provider. Do not stop taking the antibiotic even if you start to feel better. ? Talk with your health care provider before you take a cough suppressant medicine.  Drink enough fluid to keep your urine clear or pale yellow.  If the air is dry, use a cold steam vaporizer or humidifier in your bedroom or your home to help loosen secretions.  Avoid anything that causes you to cough at work or at home.  If your cough is worse at night, try sleeping in a semi-upright position.  Avoid cigarette smoke. If you smoke, quit smoking. If you need help quitting, ask your health care provider.  Avoid caffeine.  Avoid alcohol.  Rest as needed.  Contact a health care provider if:  You have new symptoms.  You cough up pus.  Your cough does not get better after 2-3 weeks, or your cough gets worse.  You cannot control your cough with suppressant  medicines and you are losing sleep.  You develop pain that is getting worse or pain that is not controlled with pain medicines.  You have a fever.  You have unexplained weight loss.  You have night sweats. Get help right away if:  You cough up blood.  You have difficulty breathing.  Your heartbeat is very fast. This information is not intended to replace advice given to you by your health care provider. Make sure you discuss any questions you have with your health care provider. Document Released: 01/30/2011 Document Revised: 01/09/2016 Document Reviewed: 10/10/2014 Elsevier Interactive Patient Education  2018 [employer].  Please use ProAir and Tussionex as needed. Increase fluids/rest/vit c-2,000mg /day when not feeling well. Alternate OTC Acetaminophen or Ibuprofen as needed for fever/discomfort. If symptoms continue into next week, please call clinic and we will send in antibiotic. FEEL BETTER!

## 2018-06-27 ENCOUNTER — Telehealth: Payer: Self-pay | Admitting: Adult Health

## 2018-06-27 ENCOUNTER — Other Ambulatory Visit: Payer: Self-pay | Admitting: Adult Health

## 2018-06-27 MED ORDER — AZITHROMYCIN 250 MG PO TABS: ORAL_TABLET | ORAL | 0 refills | Status: DC

## 2018-06-27 NOTE — Telephone Encounter (Signed)
Patient was advised to call our office today if she was not feeling better and that is the case. She was informed that Valetta Fuller would possibly be sending in an antibiotic order to [facility]. Please advise

## 2018-06-27 NOTE — Telephone Encounter (Signed)
Please tell pt that ABX has been sent In Thanks! Rebecca Robertson

## 2018-07-04 ENCOUNTER — Other Ambulatory Visit: Payer: Self-pay | Admitting: Adult Health

## 2018-07-04 ENCOUNTER — Telehealth: Payer: Self-pay

## 2018-07-04 DIAGNOSIS — F329 Major depressive disorder, single episode, unspecified: Secondary | ICD-10-CM

## 2018-07-04 DIAGNOSIS — F32A Depression, unspecified: Secondary | ICD-10-CM

## 2018-07-04 DIAGNOSIS — F419 Anxiety disorder, unspecified: Secondary | ICD-10-CM

## 2018-07-04 NOTE — Telephone Encounter (Signed)
Please call pt and have her schedule a follow up with [facility].  She was supposed to have returned 04/2018.  No further refills until pt is seen.  Charyl Bigger, CMA

## 2018-07-05 ENCOUNTER — Telehealth: Payer: Self-pay | Admitting: Urology

## 2018-07-05 NOTE — Telephone Encounter (Signed)
Pt needs a refill for Trimethoprim 100 mg sent to [facility] in [place] [phone]

## 2018-07-06 NOTE — Telephone Encounter (Signed)
Okay to refill Trimethoprim?

## 2018-07-08 NOTE — Telephone Encounter (Signed)
Yes thanks 

## 2018-07-11 ENCOUNTER — Encounter: Payer: Self-pay | Admitting: Family Medicine

## 2018-07-11 ENCOUNTER — Ambulatory Visit (INDEPENDENT_AMBULATORY_CARE_PROVIDER_SITE_OTHER): Payer: Managed Care, Other (non HMO) | Admitting: Family Medicine

## 2018-07-11 VITALS — BP 111/71 | HR 89 | Temp 99.1°F | Ht 63.0 in | Wt 129.0 lb

## 2018-07-11 DIAGNOSIS — E0800 Diabetes mellitus due to underlying condition with hyperosmolarity without nonketotic hyperglycemic-hyperosmolar coma (NKHHC): Secondary | ICD-10-CM | POA: Diagnosis not present

## 2018-07-11 DIAGNOSIS — R0982 Postnasal drip: Secondary | ICD-10-CM

## 2018-07-11 DIAGNOSIS — R05 Cough: Secondary | ICD-10-CM

## 2018-07-11 DIAGNOSIS — R059 Cough, unspecified: Secondary | ICD-10-CM

## 2018-07-11 DIAGNOSIS — J40 Bronchitis, not specified as acute or chronic: Secondary | ICD-10-CM

## 2018-07-11 DIAGNOSIS — R6889 Other general symptoms and signs: Secondary | ICD-10-CM

## 2018-07-11 MED ORDER — DEXAMETHASONE SODIUM PHOSPHATE 4 MG/ML IJ SOLN: 4.0000 mg | Freq: Once | INTRAMUSCULAR | Status: DC

## 2018-07-11 MED ORDER — METHYLPREDNISOLONE ACETATE 40 MG/ML IJ SUSP
40.0000 mg | Freq: Once | INTRAMUSCULAR | Status: AC
  Administered 2018-07-11: 40 mg via INTRAMUSCULAR

## 2018-07-11 MED ORDER — DEXAMETHASONE SODIUM PHOSPHATE 4 MG/ML IJ SOLN
4.0000 mg | Freq: Once | INTRAMUSCULAR | Status: AC
  Administered 2018-07-11: 4 mg via INTRAMUSCULAR

## 2018-07-11 NOTE — Patient Instructions (Signed)
- Melissa,   It is imperative that you slow down and take care of yourself when feeling this way.   Your body will not be able to recover unless you give it time to.   You can use over-the-counter afrin nasal spray for up to 3 days (NO longer than that) which will help acutely with nasal drainage/ congestion short term.    Also, sterile saline nasal rinses, such as Milta Deiters med or AYR sinus rinses, can be very helpful and should be done twice daily- especially throughout the allergy season.    Remember you should use distilled water or previously boiled water to do this.   You can also use an over the counter cold and flu medication such as Tylenol Severe Cold and Sinus/Flu or Dayquil, Nyquil and the like, which will help with cough, congestion, headache/ pain, fevers/chills etc.  Please note, if you being treated for hypertension or have high blood pressure, you should be using the cold meds designated "HBP".    Unfortunately, antibiotics are not helpful for viral infections.  Wash your hands frequently, as you did not want to get those around you sick as well. Never sneeze or cough on others.  And you should not be going to school or work if you are running a temperature of 100.5 or more on two separate occasions.   Drink plenty of fluids and stay hydrated, especially if you are running fevers.  We don't know why, but chicken soup also helps, try it! :)    Upper respiratory viral infection, Adult Adenoviruses are common viruses that cause many different types of infections. The viruses usually affect the lungs, but they can also affect other parts of the body, including the eyes, stomach, bowels, bladder, and brain. The most common type of adenovirus infection is the common cold. Usually, adenovirus infections are not severe unless you have another health problem that makes it hard for your body to fight off infection. What are the causes? You can get this condition if you:  Touch a surface or  object that has an adenovirus on it and then touch your mouth, nose, or eyes with unwashed hands.  Come into close physical contact with an infected person, such as by hugging or shaking hands.  Breathe in droplets that fly through the air when an infected person talks, coughs, or sneezes.  Have contact with infected stool.  Swim in a pool that does not have enough chlorine.  Adenoviruses can live outside the body for many weeks. They spread easily from person to person (are contagious). What increases the risk? This condition is more likely to develop in:  People who spend a lot of time in places where there are many people, such as schools, summer camps, daycare centers, community centers, and [employer] recruit training centers.  Elderly adults.  People with a weak body defense system (immune system).  People with a lung disease.  People with a heart condition.  What are the signs or symptoms? Adenovirus infections usually cause flu-like symptoms. Once the virus gets into the body, symptoms of this condition can take up to 14 days to develop. Symptoms may include:  Headache.  Stiff neck.  Sleepiness or fatigue.  Confusion or disorientation.  Fever.  Sore throat.  Cough.  Trouble breathing.  Runny nose or congestion.  Pink eye (conjunctivitis).  Bleeding into the covering of the eye.  Stomachache or diarrhea.  Nausea or vomiting.  Blood in the urine or pain while urinating.  Ear pain or fullness.  How is this diagnosed? This condition may be diagnosed based on your symptoms and a physical exam. Your health care provider may order tests to make sure your symptoms are not caused by another type of problem. Tests can include:  Blood tests.  Urine tests.  Stool tests.  Chest X-ray.  Tissue or throat culture.  How is this treated? This condition goes away on its own with time. Treatment for this condition involves managing symptoms until the condition  goes away. Your health care provider may recommend:  Rest.  Drinking more fluids.  Taking over-the-counter medicine to help relieve a sore throat, fever, or headache.  Follow these instructions at home:  Rest at home until your symptoms go away.  Drink enough fluid to keep your urine clear or pale yellow.  Take over-the-counter and prescription medicines only as told by your health care provider.  Keep all follow-up visits as told by your health care provider. This is important. How is this prevented? Adenoviruses are resistant to many cleaning products and can remain on surfaces for long periods of time. To help prevent infection:  Wash your hands often with soap and water.  Cover your nose or mouth when you sneeze or cough.  Do not touch your eyes, nose, or mouth with unwashed hands.  Clean commonly used objects often.  Do not swim in a pool that is not properly chlorinated.  Avoid close contact with people who are sick.  Do not go to school or work when you are sick.  Contact a health care provider if:  Your symptoms do not improve after 10 days.  Your symptoms get worse.  You cannot eat or drink without vomiting. Get help right away if:  You have trouble breathing or you are breathing rapidly.  Your skin, lips, or fingernails look blue (cyanosis).  You have a rapid heart rate.  You become confused.  You lose consciousness. This information is not intended to replace advice given to you by your health care provider. Make sure you discuss any questions you have with your health care provider. Document Released: 10/24/2002 Document Revised: 03/30/2016 Document Reviewed: 03/30/2016 Elsevier Interactive Patient Education  [employer].

## 2018-07-11 NOTE — Progress Notes (Signed)
Acute Care Office visit  Assessment and plan:  1. Post-nasal drainage   2. Cough   3. Bronchitis   4. Diabetes mellitus due to underlying condition with hyperosmolarity without coma, without long-term current use of insulin ([place])   5. Ill feeling     - Viral vs Allergic vs Bacterial causes for pt's symptoms reveiwed.    - Patient recently finished Z-pack prescribed on 06/27/2018.  - Supportive care and various OTC medications discussed in addition to any prescribed. - Strongly encouraged patient to push fluids and practice prudent supportive care.  - Discussed that pt's post-nasal drip is severe.  Advised the patient to begin using AYR or Neilmed sinus rinses BID followed by flonase BID (one spray to each nostril).  Advised that the patient may also incorporate allegra or claritin PRN.   - Call or RTC if new symptoms, or if no improvement or worse over next several days.    - Steroid injection prescribed & administered today.  4 of dexamethazone and 40 depo.  - Patient with diabetic status.  Reviewed risks and benefits of steroid injection today.  Advised patient that her blood sugars may bump due to the steroid injection; stay away from sweets and carbs.  Discussed that if her blood sugar somehow spikes unusually high, she should seek care and assistance, and if sugars reach emergency levels, she should call the ED for immediate treatment.    Meds ordered this encounter  Medications  . methylPREDNISolone acetate (DEPO-MEDROL) injection 40 mg  . DISCONTD: dexamethasone (DECADRON) injection 4 mg  . dexamethasone (DECADRON) injection 4 mg    Medications Discontinued During This Encounter  Medication Reason  . azithromycin (ZITHROMAX) 250 MG tablet Completed Course  . fluconazole (DIFLUCAN) 150 MG tablet Completed Course  . dexamethasone (DECADRON) injection 4 mg       Gross side effects, risk and benefits, and alternatives of medications discussed with patient.  Patient  is aware that all medications have potential side effects and we are unable to predict every sideeffect or drug-drug interaction that may occur.  Expresses verbal understanding and consents to current therapy plan and treatment regiment.   Education and routine counseling performed. Handouts provided.  Anticipatory guidance and routine counseling done re: condition, txmnt options and need for follow up. All questions of patient's were answered.   Return if symptoms worsen or fail to improve, for f/up with your PCP- Valetta Fuller near future for chronic care.   Please see AVS handed out to patient at the end of our visit for additional patient instructions/ counseling done pertaining to today's office visit.  Note:  This document was partially repared using Dragon voice recognition software and may include unintentional dictation errors.  This document serves as a record of services personally performed by Mellody Dance, DO. It was created on her behalf by Toni Amend, a trained medical scribe. The creation of this record is based on the scribe's personal observations and the provider's statements to them.   I have reviewed the above medical documentation for accuracy and completeness and I concur.  Mellody Dance, DO 07/12/2018 5:40 PM       Subjective:    Chief Complaint  Patient presents with  . Cough    HPI:  Pt presents with Sx for over two weeks.  No improvement. After treatment on 06/21/2018.  C/o:  Feeling run down, wiped out.  Back hurts, chest hurts, "I just feel like I can't function." Is feeling pain in  her chest, "from coughing maybe."  Denies:  Exposure to flu.  Says her husband had the flu a while back.    For symptoms patient has tried:  Patient has been using Delsym OTC.  Says the Delsym helps "for a little while," and then the cough comes back.  Patient was provided Tussionex but did not take it because she was afraid of the codeine element.  Waited a  week since her visit 06/21/2018, wasn't feeling any better, called in Monday 06/27/2018.  Was prescribed Z-pack for productive cough at that time.  Patient states she took the Z-pack, and still feels bad.  Has been using albuterol inhaler PRN as prescribed.  Overall getting:  Still feeling sick after Z-pack.   Patient Care Team    Relationship Specialty Notifications Start End  Burke, Valetta Fuller D, NP PCP - General Family Medicine  06/01/17   Teena Irani, MD (Inactive) Consulting Physician Gastroenterology  06/02/16   Delrae Rend, MD Consulting Physician Endocrinology  06/02/16   Druscilla Brownie, MD Consulting Physician Dermatology  06/02/16   Nicholas Lose, MD Consulting Physician Hematology and Oncology  06/02/16   Bjorn Loser, MD Consulting Physician Urology  06/02/16   Christain Sacramento, [place] Referring Physician Optometry  05/11/17     Past medical history, Surgical history, Family history reviewed and noted below, Social history, Allergies, and Medications have been entered into the medical record, reviewed and changed as needed.   Allergies  Allergen Reactions  . Compazine Other (See Comments)    Possible seizure, unable to talk, eyes rolled toward back of head, mouth became dry.  [name] [Exenatide]     Severe burning with injection    Review of Systems: - see above HPI for pertinent positives General:   No F/C, wt loss Pulm:   No DIB, pleuritic chest pain Card:  No CP, palpitations Abd:  No n/v/d or pain Ext:  No inc edema from baseline   Objective:   Blood pressure 111/71, pulse 89, temperature 99.1 F (37.3 C), height 5\' 3"  (1.6 m), weight 129 lb (58.5 kg), SpO2 99 %. Body mass index is 22.85 kg/m. General: Well Developed, well nourished, appropriate for stated age.  Neuro: Alert and oriented x3, extra-ocular muscles intact, sensation grossly intact.  HEENT: Normocephalic, atraumatic, pupils equal round reactive to light, neck supple, no masses, no painful  lymphadenopathy, TM's intact B/L, no acute findings. Nares- patent, clear d/c, OP- Posterior oropharynx with evidence of post-nasal drip with whitish snot/phlegm present, erythema, No TTP sinuses Skin: Warm and dry, no gross rash. Cardiac: RRR, S1 S2,  no murmurs rubs or gallops.  Respiratory: ECTA B/L and A/P, Not using accessory muscles, speaking in full sentences- unlabored. Vascular:  No gross lower ext edema, cap RF less 2 sec. Psych: No HI/SI, judgement and insight good, Euthymic mood. Full Affect.

## 2018-08-08 ENCOUNTER — Other Ambulatory Visit: Payer: Self-pay

## 2018-08-08 DIAGNOSIS — N390 Urinary tract infection, site not specified: Secondary | ICD-10-CM

## 2018-08-08 MED ORDER — TRIMETHOPRIM 100 MG PO TABS
100.0000 mg | ORAL_TABLET | Freq: Every evening | ORAL | 3 refills | Status: DC
Start: 2018-08-08 — End: 2018-11-24

## 2018-09-14 ENCOUNTER — Other Ambulatory Visit: Payer: Self-pay | Admitting: Adult Health

## 2018-09-14 DIAGNOSIS — Z1231 Encounter for screening mammogram for malignant neoplasm of breast: Secondary | ICD-10-CM

## 2018-09-20 ENCOUNTER — Other Ambulatory Visit: Payer: Self-pay | Admitting: Adult Health

## 2018-09-20 DIAGNOSIS — F419 Anxiety disorder, unspecified: Secondary | ICD-10-CM

## 2018-09-20 DIAGNOSIS — F329 Major depressive disorder, single episode, unspecified: Secondary | ICD-10-CM

## 2018-09-20 DIAGNOSIS — F32A Depression, unspecified: Secondary | ICD-10-CM

## 2018-09-26 ENCOUNTER — Encounter: Payer: Self-pay | Admitting: Adult Health

## 2018-09-26 ENCOUNTER — Ambulatory Visit (INDEPENDENT_AMBULATORY_CARE_PROVIDER_SITE_OTHER): Payer: Managed Care, Other (non HMO) | Admitting: Adult Health

## 2018-09-26 VITALS — BP 106/62 | HR 78 | Temp 98.4°F | Ht 63.0 in | Wt 129.2 lb

## 2018-09-26 DIAGNOSIS — R6889 Other general symptoms and signs: Secondary | ICD-10-CM

## 2018-09-26 DIAGNOSIS — R062 Wheezing: Secondary | ICD-10-CM | POA: Diagnosis not present

## 2018-09-26 LAB — POCT INFLUENZA A/B
Influenza A, POC: NEGATIVE
Influenza B, POC: NEGATIVE

## 2018-09-26 MED ORDER — PREDNISONE 20 MG PO TABS: ORAL_TABLET | ORAL | 0 refills | Status: DC

## 2018-09-26 NOTE — Assessment & Plan Note (Signed)
Flu test Negative. To help with chest tightness and occasional cough- please take Prednisone taper as directed. Continue to remain well hydrated and if wheezing persists recommend using ProAir inhaler as directed. If coughing/chest tightness persists into next week please call clinic and we will send in antibiotic course. FEEL BETTER!

## 2018-09-26 NOTE — Patient Instructions (Addendum)
Bronchospasm, Adult  Bronchospasm is a tightening of the airways going into the lungs. During an episode, it may be harder to breathe. You may cough, and you may make a whistling sound when you breathe (wheeze). This condition often affects people with asthma. What are the causes? This condition is caused by swelling and irritation in the airways. It can be triggered by:  An infection (common).  Seasonal allergies.  An allergic reaction.  Exercise.  Irritants. These include pollution, cigarette smoke, strong odors, aerosol sprays, and paint fumes.  Weather changes. Winds increase molds and pollens in the air. Cold air may cause swelling.  Stress and emotional upset. What are the signs or symptoms? Symptoms of this condition include:  Wheezing. If the episode was triggered by an allergy, wheezing may start right away or hours later.  Nighttime coughing.  Frequent or severe coughing with a simple cold.  Chest tightness.  Shortness of breath.  Decreased ability to exercise. How is this diagnosed? This condition is usually diagnosed with a review of your medical history and a physical exam. Tests, such as lung function tests, are sometimes done to look for other conditions. The need for a chest X-ray depends on where the wheezing occurs and whether it is the first time you have wheezed. How is this treated? This condition may be treated with:  Inhaled medicines. These open up the airways and help you breathe. They can be taken with an inhaler or a nebulizer device.  Corticosteroid medicines. These may be given for severe bronchospasm, usually when it is associated with asthma.  Avoiding triggers, such as irritants, infection, or allergies. Follow these instructions at home: Medicines  Take over-the-counter and prescription medicines only as told by your health care provider.  If you need to use an inhaler or nebulizer to take your medicine, ask your health care provider  to explain how to use it correctly. If you were given a spacer, always use it with your inhaler. Lifestyle  Reduce the number of triggers in your home. To do this: ? Change your heating and air conditioning filter at least once a month. ? Limit your use of fireplaces and wood stoves. ? Do not smoke. Do not allow smoking in your home. ? Avoid using perfumes and fragrances. ? Get rid of pests, such as roaches and mice, and their droppings. ? Remove any mold from your home. ? Keep your house clean and dust free. Use unscented cleaning products. ? Replace carpet with wood, tile, or vinyl flooring. Carpet can trap dander and dust. ? Use allergy-proof pillows, mattress covers, and box spring covers. ? Wash bed sheets and blankets every week in hot water. Dry them in a dryer. ? Use blankets that are made of polyester or cotton. ? Wash your hands often. ? Do not allow pets in your bedroom.  Avoid breathing in cold air when you exercise. General instructions  Have a plan for seeking medical care. Know when to call your health care provider and local emergency services, and where to get emergency care.  Stay up to date on your immunizations.  When you have an episode of bronchospasm, stay calm. Try to relax and breathe more slowly.  If you have asthma, make sure you have an asthma action plan.  Keep all follow-up visits as told by your health care provider. This is important. Contact a health care provider if:  You have muscle aches.  You have chest pain.  The mucus that you cough up (  sputum) changes from clear or white to yellow, green, gray, or bloody.  You have a fever.  Your sputum gets thicker. Get help right away if:  Your wheezing and coughing get worse, even after you take your prescribed medicines.  It gets even harder to breathe.  You develop severe chest pain. Summary  Bronchospasm is a tightening of the airways going into the lungs.  During an episode of  bronchospasm, you may have a harder time breathing. You may cough and make a whistling sound when you breathe (wheeze).  Avoid exposure to triggers such as smoke, dust, mold, animal dander, and fragrances.  When you have an episode of bronchospasm, stay calm. Try to relax and breathe more slowly. This information is not intended to replace advice given to you by your health care provider. Make sure you discuss any questions you have with your health care provider. Document Released: 08/06/2003 Document Revised: 07/30/2016 Document Reviewed: 07/30/2016 Elsevier Interactive Patient Education  2019 [place].  Flu test Negative. To help with chest tightness and occasional cough- please take Prednisone taper as directed. Continue to remain well hydrated and if wheezing persists recommend using ProAir inhaler as directed. If coughing/chest tightness persists into next week please call clinic and we will send in antibiotic course. FEEL BETTER!

## 2018-09-26 NOTE — Assessment & Plan Note (Signed)
Flu test Negative 

## 2018-09-26 NOTE — Progress Notes (Signed)
Subjective:    Patient ID: Rebecca Robertson, female    DOB: 15-Jun-1956, 64 y.o.   MRN: [id number]  HPI:  Rebecca Robertson presents with frontal HA (6/10, dull ache), body aches, malaise, and feeling "shaky" that started yesterday. She denies nasal drainage, night sweats, chills, fever, cough, however reports intermittent wheezing. She denies N/V/D She continues to abstain from tobacco/vape use She was at [facility] all week last week visiting her hospitalized grandson and states "I could have been exposed to something then". She was treated with two rounds of ABX dec for bronchitis in Nov 2019   Patient Care Team    Relationship Specialty Notifications Start End  Mina Marble D, NP PCP - General Family Medicine  06/01/17   Teena Irani, MD (Inactive) Consulting Physician Gastroenterology  06/02/16   Delrae Rend, MD Consulting Physician Endocrinology  06/02/16   Druscilla Brownie, MD Consulting Physician Dermatology  06/02/16   Nicholas Lose, MD Consulting Physician Hematology and Oncology  06/02/16   Bjorn Loser, MD Consulting Physician Urology  06/02/16   Christain Sacramento, [place] Referring Physician Optometry  05/11/17     Patient Active Problem List   Diagnosis Date Noted  . Flu-like symptoms 09/26/2018  . Wheezing 09/26/2018  . Cough in adult 06/21/2018  . Vaginal yeast infection 03/28/2018  . Abnormal nuclear stress test   . Chest pain 12/07/2017  . Healthcare maintenance 11/11/2017  . Screening for cervical cancer 11/11/2017  . Personal history UTI- 04/07/17, + Cx- txed with abx 05/18/2017  . Urinary frequency 05/18/2017  . Diarrhea in adult patient 05/11/2017  . UTI (urinary tract infection) 04/07/2017  . Dysuria 04/07/2017  . Abnormal urinalysis 04/07/2017  . Cervical smear, as part of routine gynecological examination 11/09/2016  . Pneumonia 09/23/2016  . Depression 05/28/2016  . B12 deficiency 05/28/2016  . SUI (stress urinary incontinence, female)  05/28/2016  . Hypertriglyceridemia 05/28/2016  . Vaginal atrophy 03/25/2016  . h/o Anxiety 03/08/2016  . Hyperlipidemia 03/04/2016  . Vitamin D insufficiency 03/04/2016  . History of breast cancer 03/04/2016  . Contact dermatitis and eczema due to cause 03/04/2016  . Closed fracture of proximal phalanx of toe 04/03/2014  . Diabetes ([place]) 03/27/2014  . Neoplasm of left breast, primary tumor staging category Tis: ductal carcinoma in situ (DCIS) 02/10/2011     Past Medical History:  Diagnosis Date  . Anxiety   . Diabetes mellitus   . History of breast cancer ONCOLOGIST-- DR Humphrey Rolls--  NO RECURRENCE   S/P LEFT PARTIAL MASTECTOMY W/ SLN BX'S  12-02-2010---  DUCTAL CARCINOMA IN SITU--  S/P RADIATION THERAPY ENDED 02-17-2011  . Hyperlipidemia   . Pneumonia   . PONV (postoperative nausea and vomiting)   . SUI (stress urinary incontinence, female)   . Vitamin D deficiency   . Vitamin D insufficiency 03/04/2016     Past Surgical History:  Procedure Laterality Date  . BLADDER SUSPENSION N/A 02/03/2013   Procedure: [facility] PROCEDURE;  Surgeon: Reece Packer, MD;  Location: [facility];  Service: Urology;  Laterality: N/A;  . BUNIONECTOMY    . CESAREAN SECTION     X4  . LEFT HEART CATH AND CORONARY ANGIOGRAPHY N/A 01/26/2018   Procedure: LEFT HEART CATH AND CORONARY ANGIOGRAPHY;  Surgeon: Wellington Hampshire, MD;  Location: [place] [facility];  Service: Cardiovascular;  Laterality: N/A;  . MENISCUS REPAIR Left   . PARTIAL MASTECTOMY WITH AXILLARY SENTINEL LYMPH NODE BIOPSY Left 12-02-2010     Family History  Problem Relation Age of Onset  . Cancer Mother        lung  . Heart attack Father   . COPD Father   . Heart disease Father      Social History   Substance and Sexual Activity  Drug Use No     Social History   Substance and Sexual Activity  Alcohol Use No     Social History   Tobacco Use  Smoking Status Never Smoker  Smokeless Tobacco Never Used      Outpatient Encounter Medications as of 09/26/2018  Medication Sig  . albuterol (PROVENTIL HFA;VENTOLIN HFA) 108 (90 Base) MCG/ACT inhaler Inhale 2 puffs into the lungs every 6 (six) hours as needed for wheezing or shortness of breath.  Marland Kitchen BAYER CONTOUR NEXT TEST test strip Check blood sugar daily  . chlorpheniramine-HYDROcodone (TUSSIONEX) 10-8 MG/5ML SUER Take 5 mLs by mouth every 12 (twelve) hours as needed.  . citalopram (CELEXA) 20 MG tablet Take 1 tablet (20 mg total) by mouth daily. NO FURTHER REFILLS UNTIL PATIENT IS SEEN IN OFFICE  . clobetasol ointment (TEMOVATE) 6.30 % Apply 1 application topically 2 (two) times daily.  . fenofibrate 160 MG tablet Take 1 tablet (160 mg total) by mouth daily.  . Fluocinolone Acetonide Body 0.01 % OIL Apply 1 application topically 2 (two) times daily as needed.  . lovastatin (MEVACOR) 40 MG tablet TAKE 1 TABLET BY MOUTH DAILY AT 8 PM.  . oxybutynin (DITROPAN) 5 MG tablet Take 1 tablet (5 mg total) by mouth 2 (two) times daily. NO FURTHER REFILLS UNTIL PATIENT IS SEEN IN OFFICE  . Semaglutide (OZEMPIC) 0.25 or 0.5 MG/DOSE SOPN Inject 0.25 mg into the skin every Sunday.  Marland Kitchen SYNJARDY 12.12-998 MG TABS Take 1 tablet 2 (two) times daily by mouth.   . trimethoprim (TRIMPEX) 100 MG tablet Take 1 tablet (100 mg total) by mouth every evening.  . predniSONE (DELTASONE) 20 MG tablet 1 tab every 12 hrs for 3 days, then daily for 3 days.   Facility-Administered Encounter Medications as of 09/26/2018  Medication  . ipratropium-albuterol (DUONEB) 0.5-2.5 (3) MG/3ML nebulizer solution 3 mL    Allergies: Compazine and Bydureon [exenatide]  Body mass index is 22.89 kg/m.  Blood pressure 106/62, pulse 78, temperature 98.4 F (36.9 C), temperature source Oral, height 5\' 3"  (1.6 m), weight 129 lb 3.2 oz (58.6 kg), SpO2 99 %.  Review of Systems  Constitutional: Positive for fatigue. Negative for activity change, appetite change, chills, diaphoresis, fever and  unexpected weight change.  HENT: Positive for postnasal drip. Negative for congestion, ear discharge, ear pain, nosebleeds, rhinorrhea, sinus pressure, sinus pain, sore throat, trouble swallowing and voice change.   Respiratory: Positive for wheezing. Negative for cough, chest tightness and shortness of breath.   Cardiovascular: Negative for chest pain, palpitations and leg swelling.  Gastrointestinal: Negative for abdominal distention, abdominal pain, blood in stool, constipation, diarrhea, nausea and vomiting.  Endocrine: Negative for cold intolerance, heat intolerance, polydipsia, polyphagia and polyuria.  Genitourinary: Negative for difficulty urinating and flank pain.  Neurological: Positive for headaches. Negative for dizziness.  Hematological: Does not bruise/bleed easily.  Psychiatric/Behavioral: Negative for behavioral problems, confusion, decreased concentration, dysphoric mood, hallucinations, self-injury, sleep disturbance and suicidal ideas. The patient is not nervous/anxious and is not hyperactive.        Objective:   Physical Exam Vitals signs and nursing note reviewed.  Constitutional:      General: She is not in acute distress.    Appearance:  She is not ill-appearing, toxic-appearing or diaphoretic.  HENT:     Right Ear: No decreased hearing noted. Tympanic membrane is not perforated or bulging.     Left Ear: No decreased hearing noted. Tympanic membrane is not perforated or bulging.     Nose: Congestion and rhinorrhea present.     Mouth/Throat:     Mouth: Mucous membranes are moist.     Pharynx: No oropharyngeal exudate or posterior oropharyngeal erythema.     Comments: Copious clear drainage noted down back of throat Eyes:     Extraocular Movements:     Right eye: Normal extraocular motion and no nystagmus.     Left eye: Normal extraocular motion and no nystagmus.     Conjunctiva/sclera: Conjunctivae normal.  Neck:     Musculoskeletal: Normal range of motion.   Cardiovascular:     Rate and Rhythm: Normal rate.     Heart sounds: Normal heart sounds. No murmur. No friction rub. No gallop.   Pulmonary:     Effort: Pulmonary effort is normal. No respiratory distress.     Breath sounds: No stridor. No wheezing, rhonchi or rales.  Chest:     Chest wall: No tenderness.  Lymphadenopathy:     Cervical: No cervical adenopathy.  Skin:    General: Skin is warm and dry.     Capillary Refill: Capillary refill takes less than 2 seconds.  Neurological:     Mental Status: She is alert and oriented to person, place, and time.  Psychiatric:        Mood and Affect: Mood normal.        Behavior: Behavior normal.        Thought Content: Thought content normal.        Judgment: Judgment normal.       Assessment & Plan:   1. Flu-like symptoms   2. Wheezing     Flu-like symptoms Flu test Negative.    Wheezing Flu test Negative. To help with chest tightness and occasional cough- please take Prednisone taper as directed. Continue to remain well hydrated and if wheezing persists recommend using ProAir inhaler as directed. If coughing/chest tightness persists into next week please call clinic and we will send in antibiotic course. FEEL BETTER!     FOLLOW-UP:  Return if symptoms worsen or fail to improve.

## 2018-10-12 ENCOUNTER — Telehealth: Payer: Self-pay | Admitting: Adult Health

## 2018-10-12 ENCOUNTER — Other Ambulatory Visit: Payer: Self-pay | Admitting: Adult Health

## 2018-10-12 MED ORDER — OXYBUTYNIN CHLORIDE 5 MG PO TABS: 5.0000 mg | ORAL_TABLET | Freq: Two times a day (BID) | ORAL | 0 refills | Status: DC

## 2018-10-12 NOTE — Telephone Encounter (Signed)
Patient called back states Rx :  Outpatient Medication Detail    Disp Refills Start End   oxybutynin (DITROPAN) 5 MG tablet 180 tablet 0 10/12/2018    Sig - Route: Take 1 tablet (5 mg total) by mouth 2 (two) times daily. - Oral   Sent to pharmacy as: oxybutynin (DITROPAN) 5 MG tablet   E-Prescribing Status: Receipt confirmed by pharmacy (10/12/2018 11:59 AM EST)    ---- Was sent to wrong pharmacy-- she requested it sent to :  [facility] ([address] [place] [phone] (Phone) [phone] (Fax)   [facility] will not give her a 90day supply nor will they transfer the Rx to [facility] stating they have already filled the prescription-- patient needs assistance.  --Forwarding message to medical assistant.  --Dion Body

## 2018-10-12 NOTE — Telephone Encounter (Signed)
Forwarding Rx refill request to medical assistant for :  oxybutynin (DITROPAN) 5 MG tablet [[id number]]   Order Details  Dose: 5 mg Route: Oral Frequency: 2 times daily  Dispense Quantity: 30 tablet Refills: 0 Fills remaining: --        Sig: Take 1 tablet (5 mg total) by mouth 2 (two) times daily. NO FURTHER REFILLS UNTIL PATIENT IS SEEN IN OFFICE     --- per patient had OV  09/26/2018 (sick visit).  --Pt request a 3 month supply order be sent to :    [facility] ([address] [place] [address] [phone] (Phone) [phone] (Fax)   --glh

## 2018-10-12 NOTE — Telephone Encounter (Signed)
Please review for appropriateness of refill at this time.  Pt's last OV was for a sick visit only.  Charyl Bigger, CMA

## 2018-10-17 ENCOUNTER — Telehealth: Payer: Self-pay

## 2018-10-17 NOTE — Telephone Encounter (Signed)
Pt called stating that she is still not feeling well.  Pt c/o cough, chest congestion, no fever, no GI symptom.  Pt requesting RX for abx.  Charyl Bigger, CMA

## 2018-10-18 ENCOUNTER — Other Ambulatory Visit: Payer: Self-pay | Admitting: Adult Health

## 2018-10-18 MED ORDER — AZITHROMYCIN 250 MG PO TABS: ORAL_TABLET | ORAL | 0 refills | Status: DC

## 2018-10-18 NOTE — Telephone Encounter (Signed)
Good Morning Tonya, Can you please call Ms. Kingston and share- I sent in azithromycin Thanks! Valetta Fuller

## 2018-10-19 NOTE — Telephone Encounter (Signed)
Pt informed of RX.  Pt expressed understanding.  Charyl Bigger, CMA

## 2018-11-03 ENCOUNTER — Other Ambulatory Visit: Payer: Self-pay | Admitting: Adult Health

## 2018-11-03 ENCOUNTER — Telehealth: Payer: Self-pay | Admitting: Adult Health

## 2018-11-03 DIAGNOSIS — F329 Major depressive disorder, single episode, unspecified: Secondary | ICD-10-CM

## 2018-11-03 DIAGNOSIS — F419 Anxiety disorder, unspecified: Secondary | ICD-10-CM

## 2018-11-03 DIAGNOSIS — F32A Depression, unspecified: Secondary | ICD-10-CM

## 2018-11-03 NOTE — Telephone Encounter (Signed)
Informed pt that she must be seen prior to any further medications.  Pt expressed understanding and is agreeable.  Charyl Bigger, CMA

## 2018-11-03 NOTE — Telephone Encounter (Signed)
Patient called says has upcoming appt for Labs & CPE in 2 wks but needs refills on Rx :  (---Pt request a  55month or 90day supply be called in for)  citalopram (CELEXA) 20 MG tablet [[id number]]   Order Details  Dose: 20 mg Route: Oral Frequency: Daily  Dispense Quantity: 15 tablet Refills: 0 Fills remaining: --        Sig: Take 1 tablet (20 mg total) by mouth daily. NO FURTHER REFILLS UNTIL PATIENT IS SEEN IN OFFICE          ---Forwarding message to medical assistant if approved send to :   [facility] [address] ([address]), [place] [address] [phone] (Phone) [phone] (Fax)   --glh

## 2018-11-11 ENCOUNTER — Other Ambulatory Visit: Payer: Self-pay

## 2018-11-11 ENCOUNTER — Other Ambulatory Visit (INDEPENDENT_AMBULATORY_CARE_PROVIDER_SITE_OTHER): Payer: Managed Care, Other (non HMO)

## 2018-11-11 DIAGNOSIS — E781 Pure hyperglyceridemia: Secondary | ICD-10-CM

## 2018-11-11 DIAGNOSIS — E782 Mixed hyperlipidemia: Secondary | ICD-10-CM

## 2018-11-11 DIAGNOSIS — E559 Vitamin D deficiency, unspecified: Secondary | ICD-10-CM

## 2018-11-11 DIAGNOSIS — E538 Deficiency of other specified B group vitamins: Secondary | ICD-10-CM

## 2018-11-11 DIAGNOSIS — E0800 Diabetes mellitus due to underlying condition with hyperosmolarity without nonketotic hyperglycemic-hyperosmolar coma (NKHHC): Secondary | ICD-10-CM

## 2018-11-12 LAB — LIPID PANEL
Chol/HDL Ratio: 3 ratio (ref 0.0–4.4)
Cholesterol, Total: 158 mg/dL (ref 100–199)
HDL: 53 mg/dL (ref >39)
LDL Calculated: 92 mg/dL (ref 0–99)
Triglycerides: 66 mg/dL (ref 0–149)
VLDL Cholesterol Cal: 13 mg/dL (ref 5–40)

## 2018-11-12 LAB — COMPREHENSIVE METABOLIC PANEL
ALT: 15 IU/L (ref 0–32)
AST: 18 IU/L (ref 0–40)
Albumin/Globulin Ratio: 1.6 (ref 1.2–2.2)
Albumin: 4.5 g/dL (ref 3.8–4.8)
Alkaline Phosphatase: 40 IU/L (ref 39–117)
BUN/Creatinine Ratio: 15 (ref 12–28)
BUN: 12 mg/dL (ref 8–27)
Bilirubin Total: 0.3 mg/dL (ref 0.0–1.2)
CALCIUM: 9.9 mg/dL (ref 8.7–10.3)
CO2: 24 mmol/L (ref 20–29)
Chloride: 103 mmol/L (ref 96–106)
Creatinine, Ser: 0.8 mg/dL (ref 0.57–1.00)
GFR calc Af Amer: 92 mL/min/{1.73_m2} (ref >59)
GFR calc non Af Amer: 80 mL/min/{1.73_m2} (ref >59)
Globulin, Total: 2.9 g/dL (ref 1.5–4.5)
Glucose: 125 mg/dL — ABNORMAL HIGH (ref 65–99)
Potassium: 4.7 mmol/L (ref 3.5–5.2)
Sodium: 143 mmol/L (ref 134–144)
Total Protein: 7.4 g/dL (ref 6.0–8.5)

## 2018-11-12 LAB — HEMOGLOBIN A1C
Est. average glucose Bld gHb Est-mCnc: 160 mg/dL
Hgb A1c MFr Bld: 7.2 % — ABNORMAL HIGH (ref 4.8–5.6)

## 2018-11-12 LAB — CBC WITH DIFFERENTIAL/PLATELET
Basophils Absolute: 0.1 10*3/uL (ref 0.0–0.2)
Basos: 1 %
EOS (ABSOLUTE): 0.1 10*3/uL (ref 0.0–0.4)
Eos: 2 %
Hematocrit: 39.3 % (ref 34.0–46.6)
Hemoglobin: 13.2 g/dL (ref 11.1–15.9)
IMMATURE GRANULOCYTES: 0 %
Immature Grans (Abs): 0 10*3/uL (ref 0.0–0.1)
Lymphocytes Absolute: 1.6 10*3/uL (ref 0.7–3.1)
Lymphs: 31 %
MCH: 28.9 pg (ref 26.6–33.0)
MCHC: 33.6 g/dL (ref 31.5–35.7)
MCV: 86 fL (ref 79–97)
Monocytes Absolute: 0.5 10*3/uL (ref 0.1–0.9)
Monocytes: 9 %
Neutrophils Absolute: 2.9 10*3/uL (ref 1.4–7.0)
Neutrophils: 57 %
PLATELETS: 300 10*3/uL (ref 150–450)
RBC: 4.57 x10E6/uL (ref 3.77–5.28)
RDW: 12.8 % (ref 11.7–15.4)
WBC: 5 10*3/uL (ref 3.4–10.8)

## 2018-11-12 LAB — VITAMIN B12: VITAMIN B 12: 307 pg/mL (ref 232–1245)

## 2018-11-12 LAB — TSH: TSH: 1.87 u[IU]/mL (ref 0.450–4.500)

## 2018-11-15 ENCOUNTER — Other Ambulatory Visit: Payer: Self-pay | Admitting: Adult Health

## 2018-11-17 ENCOUNTER — Telehealth: Payer: Self-pay | Admitting: Adult Health

## 2018-11-17 ENCOUNTER — Encounter: Payer: Managed Care, Other (non HMO) | Admitting: Adult Health

## 2018-11-17 NOTE — Telephone Encounter (Signed)
Patient is requesting a refill of her citalopram and lovastatin. If approved please send to [facility].

## 2018-11-19 ENCOUNTER — Other Ambulatory Visit: Payer: Self-pay | Admitting: Adult Health

## 2018-11-19 DIAGNOSIS — F32A Depression, unspecified: Secondary | ICD-10-CM

## 2018-11-19 DIAGNOSIS — F419 Anxiety disorder, unspecified: Secondary | ICD-10-CM

## 2018-11-19 DIAGNOSIS — F329 Major depressive disorder, single episode, unspecified: Secondary | ICD-10-CM

## 2018-11-21 ENCOUNTER — Telehealth: Payer: Self-pay | Admitting: Adult Health

## 2018-11-21 ENCOUNTER — Telehealth: Payer: Self-pay

## 2018-11-21 NOTE — Telephone Encounter (Signed)
Patient called about her refills that were sent in today, she states that she was only given 15 days of med and need OV for any additional refills. She has a f/u in June and had a CPE scheduled for last week that we cancelled (we are cancelling all CPEs currently). She wants to be able to get her full 90 days of this med and not just the 15 days (and have to make multiple trips out of the house to pharmacy for this). Please advise what we should do for this patient please

## 2018-11-21 NOTE — Telephone Encounter (Signed)
Please call patient and advise her that she must have a telemedicine visit prior to any further refills.  Sent in 15 day supply today.  Charyl Bigger, CMA

## 2018-11-21 NOTE — Telephone Encounter (Signed)
Fifteen day supply only sent to pharmacy.  Pt must have telemedicine visit prior to any further refills.  Charyl Bigger, CMA

## 2018-11-22 NOTE — Telephone Encounter (Signed)
Pt advised that she needs telemedicine visit prior to any further refills.  Pt expressed understanding and was transferred to front desk to schedule.  Charyl Bigger, CMA

## 2018-11-23 NOTE — Progress Notes (Signed)
Virtual Visit via Telephone Note  I connected with Rebecca Robertson on 11/24/18 at  9:15 AM EDT by telephone and verified that I am speaking with the correct person using two identifiers.   I discussed the limitations, risks, security and privacy concerns of performing an evaluation and management service by telephone and the availability of in person appointments. The staff discussed with the patient that there may be a patient responsible charge related to this service. The patient expressed understanding and agreed to proceed.   History of Present Illness: Rebecca Robertson calls in today for regular f/u: Depression, T2D She reports AM BG <120s She denies episodes of hypoglycemia  She is followed by Dr. Kerr/Endocrinology, per pt they have changed her to annual f/u Home BP readings SBP 110 DBP 70s She reports following a Diabetic Diet consistently and walking daily Recommended using American Diabetic Association (ADA) website for lists of foods that are low in glycemic index, diabetic recipes She has been watching her grandchildren since daycare has been closed  Due to COVID-19 and states "I don't stop moving from the time I wake up until the time I go to bed". She has been on citalopram (Celexa) 20mg  since 2012. She was started on this SSRI after the death of her first husband and starting breat ca tx. She reports stable mood, denies SI/HI and would like to taper off. Discussed weaning schedule  Lab Results  Component Value Date   HGBA1C 7.2 (H) 11/11/2018   HGBA1C 6.4 (H) 11/11/2017   HGBA1C 6.6 11/03/2017   HGBA1C 6.6 11/03/2017   Patient Care Team    Relationship Specialty Notifications Start End  Mina Marble D, NP PCP - General Family Medicine  06/01/17   Teena Irani, MD (Inactive) Consulting Physician Gastroenterology  06/02/16   Delrae Rend, MD Consulting Physician Endocrinology  06/02/16   Druscilla Brownie, MD Consulting Physician Dermatology  06/02/16   Nicholas Lose, MD Consulting Physician Hematology and Oncology  06/02/16   Bjorn Loser, MD Consulting Physician Urology  06/02/16   Christain Sacramento, [place] Referring Physician Optometry  05/11/17     Patient Active Problem List   Diagnosis Date Noted  . Flu-like symptoms 09/26/2018  . Wheezing 09/26/2018  . Cough in adult 06/21/2018  . Vaginal yeast infection 03/28/2018  . Abnormal nuclear stress test   . Chest pain 12/07/2017  . Healthcare maintenance 11/11/2017  . Screening for cervical cancer 11/11/2017  . Personal history UTI- 04/07/17, + Cx- txed with abx 05/18/2017  . Urinary frequency 05/18/2017  . Diarrhea in adult patient 05/11/2017  . UTI (urinary tract infection) 04/07/2017  . Dysuria 04/07/2017  . Abnormal urinalysis 04/07/2017  . Cervical smear, as part of routine gynecological examination 11/09/2016  . Pneumonia 09/23/2016  . Depression 05/28/2016  . B12 deficiency 05/28/2016  . SUI (stress urinary incontinence, female) 05/28/2016  . Hypertriglyceridemia 05/28/2016  . Vaginal atrophy 03/25/2016  . h/o Anxiety 03/08/2016  . Hyperlipidemia 03/04/2016  . Vitamin D insufficiency 03/04/2016  . History of breast cancer 03/04/2016  . Contact dermatitis and eczema due to cause 03/04/2016  . Closed fracture of proximal phalanx of toe 04/03/2014  . Diabetes ([place]) 03/27/2014  . Neoplasm of left breast, primary tumor staging category Tis: ductal carcinoma in situ (DCIS) 02/10/2011     Past Medical History:  Diagnosis Date  . Anxiety   . Diabetes mellitus   . History of breast cancer ONCOLOGIST-- DR Humphrey Rolls--  NO RECURRENCE   S/P LEFT PARTIAL MASTECTOMY W/  SLN BX'S  12-02-2010---  DUCTAL CARCINOMA IN SITU--  S/P RADIATION THERAPY ENDED 02-17-2011  . Hyperlipidemia   . Pneumonia   . PONV (postoperative nausea and vomiting)   . SUI (stress urinary incontinence, female)   . Vitamin D deficiency   . Vitamin D insufficiency 03/04/2016     Past Surgical History:  Procedure  Laterality Date  . BLADDER SUSPENSION N/A 02/03/2013   Procedure: [facility] PROCEDURE;  Surgeon: Reece Packer, MD;  [facility];  Service: Urology;  Laterality: N/A;  . BUNIONECTOMY    . CESAREAN SECTION     X4  . LEFT HEART CATH AND CORONARY ANGIOGRAPHY N/A 01/26/2018   Procedure: LEFT HEART CATH AND CORONARY ANGIOGRAPHY;  Surgeon: Wellington Hampshire, MD;  Location: [place] [facility];  Service: Cardiovascular;  Laterality: N/A;  . MENISCUS REPAIR Left   . PARTIAL MASTECTOMY WITH AXILLARY SENTINEL LYMPH NODE BIOPSY Left 12-02-2010     Family History  Problem Relation Age of Onset  . Cancer Mother        lung  . Heart attack Father   . COPD Father   . Heart disease Father      Social History   Substance and Sexual Activity  Drug Use No     Social History   Substance and Sexual Activity  Alcohol Use No     Social History   Tobacco Use  Smoking Status Never Smoker  Smokeless Tobacco Never Used     Outpatient Encounter Medications as of 11/24/2018  Medication Sig  . albuterol (PROVENTIL HFA;VENTOLIN HFA) 108 (90 Base) MCG/ACT inhaler Inhale 2 puffs into the lungs every 6 (six) hours as needed for wheezing or shortness of breath.  Marland Kitchen BAYER CONTOUR NEXT TEST test strip Check blood sugar daily  . citalopram (CELEXA) 20 MG tablet 1/2 tab by mouth for 4 weeks, then 1/4 tab by mouth for 4 weeks, then stop medication  . clobetasol ointment (TEMOVATE) 9.52 % Apply 1 application topically 2 (two) times daily.  . fenofibrate 160 MG tablet Take 1 tablet (160 mg total) by mouth daily.  . Fluocinolone Acetonide Body 0.01 % OIL Apply 1 application topically 2 (two) times daily as needed.  . lovastatin (MEVACOR) 40 MG tablet TAKE 1 TABLET BY MOUTH ONCE DAILY AT  6PM.  . oxybutynin (DITROPAN) 5 MG tablet Take 1 tablet (5 mg total) by mouth 2 (two) times daily.  . Semaglutide (OZEMPIC) 0.25 or 0.5 MG/DOSE SOPN Inject 0.25 mg into the skin every Sunday.  Marland Kitchen  SYNJARDY 12.12-998 MG TABS Take 1 tablet 2 (two) times daily by mouth.   . [DISCONTINUED] albuterol (PROVENTIL HFA;VENTOLIN HFA) 108 (90 Base) MCG/ACT inhaler Inhale 2 puffs into the lungs every 6 (six) hours as needed for wheezing or shortness of breath.  . [DISCONTINUED] citalopram (CELEXA) 20 MG tablet TAKE 1 TABLET BY MOUTH ONCE DAILY(NO FURTHER REFILLS UNTIL SEEN IN OFFICE)  . [DISCONTINUED] fenofibrate 160 MG tablet Take 1 tablet (160 mg total) by mouth daily.  . [DISCONTINUED] lovastatin (MEVACOR) 40 MG tablet TAKE 1 TABLET BY MOUTH ONCE DAILY AT  6PM.  PATIENT MUST HAVE OFFICE VISIT PRIOR TO ANY FURTHER REFILLS  . [DISCONTINUED] predniSONE (DELTASONE) 20 MG tablet 1 tab every 12 hrs for 3 days, then daily for 3 days.  . [DISCONTINUED] trimethoprim (TRIMPEX) 100 MG tablet Take 1 tablet (100 mg total) by mouth every evening.   Facility-Administered Encounter Medications as of 11/24/2018  Medication  . ipratropium-albuterol (DUONEB)  0.5-2.5 (3) MG/3ML nebulizer solution 3 mL    Allergies: Compazine and Bydureon [exenatide]  Body mass index is 22.5 kg/m.  Blood pressure 120/79, pulse 81, height 5\' 3"  (1.6 m), weight 127 lb (57.6 kg).  Observations/Objective: No acute distress noted over the telephone  Assessment and Plan: Continue all medications as directed, with one change- Taper off Citalopram 20mg : 1/2 tab QD for 4 weeks, then 14 tab for 4 weeks, then stop ADA website for help with direction on glycemic index of foods, recipes Remain active Continue with Endocrinologist as directed Remain well hydrated Explained in detail the necessity of chronic visits   Follow Up Instructions: CPE June 2020   I discussed the assessment and treatment plan with the patient. The patient was provided an opportunity to ask questions and all were answered. The patient agreed with the plan and demonstrated an understanding of the instructions.   The patient was advised to call back or seek an  in-person evaluation if the symptoms worsen or if the condition fails to improve as anticipated.  I provided 25 minutes of non-face-to-face time during this encounter.   Esaw Grandchild, NP

## 2018-11-24 ENCOUNTER — Other Ambulatory Visit: Payer: Self-pay

## 2018-11-24 ENCOUNTER — Ambulatory Visit (INDEPENDENT_AMBULATORY_CARE_PROVIDER_SITE_OTHER): Payer: Managed Care, Other (non HMO) | Admitting: Adult Health

## 2018-11-24 ENCOUNTER — Encounter: Payer: Self-pay | Admitting: Adult Health

## 2018-11-24 DIAGNOSIS — F419 Anxiety disorder, unspecified: Secondary | ICD-10-CM | POA: Diagnosis not present

## 2018-11-24 DIAGNOSIS — F329 Major depressive disorder, single episode, unspecified: Secondary | ICD-10-CM

## 2018-11-24 DIAGNOSIS — E0800 Diabetes mellitus due to underlying condition with hyperosmolarity without nonketotic hyperglycemic-hyperosmolar coma (NKHHC): Secondary | ICD-10-CM

## 2018-11-24 DIAGNOSIS — Z0000 Encounter for general adult medical examination without abnormal findings: Secondary | ICD-10-CM

## 2018-11-24 DIAGNOSIS — F32A Depression, unspecified: Secondary | ICD-10-CM

## 2018-11-24 MED ORDER — FENOFIBRATE 160 MG PO TABS: 160.0000 mg | ORAL_TABLET | Freq: Every day | ORAL | 2 refills | Status: DC

## 2018-11-24 MED ORDER — CITALOPRAM HYDROBROMIDE 20 MG PO TABS: ORAL_TABLET | ORAL | 0 refills | Status: DC

## 2018-11-24 MED ORDER — LOVASTATIN 40 MG PO TABS: ORAL_TABLET | ORAL | 2 refills | Status: DC

## 2018-11-24 MED ORDER — ALBUTEROL SULFATE HFA 108 (90 BASE) MCG/ACT IN AERS: 2.0000 | INHALATION_SPRAY | Freq: Four times a day (QID) | RESPIRATORY_TRACT | 2 refills | Status: DC | PRN

## 2018-11-24 NOTE — Assessment & Plan Note (Signed)
Assessment and Plan: Continue all medications as directed, with one change- Taper off Citalopram 20mg : 1/2 tab QD for 4 weeks, then 14 tab for 4 weeks, then stop ADA website for help with direction on glycemic index of foods, recipes Remain active Continue with Endocrinologist as directed Remain well hydrated Explained in detail the necessity of chronic visits   Follow Up Instructions: CPE June 2020   I discussed the assessment and treatment plan with the patient. The patient was provided an opportunity to ask questions and all were answered. The patient agreed with the plan and demonstrated an understanding of the instructions.   The patient was advised to call back or seek an in-person evaluation if the symptoms worsen or if the condition fails to improve as anticipated.

## 2018-11-24 NOTE — Assessment & Plan Note (Signed)
Lab Results  Component Value Date   HGBA1C 7.2 (H) 11/11/2018   HGBA1C 6.4 (H) 11/11/2017   HGBA1C 6.6 11/03/2017   HGBA1C 6.6 11/03/2017  ADA website for help with direction on glycemic index of foods, recipes Followed by Dr. Lindwood Qua

## 2018-11-24 NOTE — Assessment & Plan Note (Signed)
Taper off Citalopram 20mg : 1/2 tab QD for 4 weeks, then 14 tab for 4 weeks, then stop

## 2018-12-05 ENCOUNTER — Ambulatory Visit: Payer: Managed Care, Other (non HMO)

## 2019-01-12 ENCOUNTER — Other Ambulatory Visit: Payer: Self-pay | Admitting: Adult Health

## 2019-01-15 ENCOUNTER — Other Ambulatory Visit: Payer: Self-pay | Admitting: Adult Health

## 2019-01-16 ENCOUNTER — Telehealth: Payer: Self-pay | Admitting: Adult Health

## 2019-01-16 NOTE — Progress Notes (Signed)
Subjective:    Patient ID: Rebecca Robertson, female    DOB: 1956-07-14, 63 y.o.   MRN: [id number]  HPI:  Ms. Kallstrom presents for CPE. She reports medication compliance, denies SE She reports AM BG 110-120s, denies episodes of hypoglycemia She is followed by Dr. Kerr/Endocrinology annually, has OV next month- they will fax over her A1c Currently on Semaglutide 0.25mg  once weekly and Synjardy 12.5/1000mg  BID She estimates to drink 40-50oz water/day, follows diabetic diet and leads a quite active life.  Lab Results  Component Value Date   HGBA1C 7.2 (H) 11/11/2018   HGBA1C 6.4 (H) 11/11/2017   HGBA1C 6.6 11/03/2017   HGBA1C 6.6 11/03/2017   She has been weaning off the Citalopram as directed, denies any withdrawal sx's  Labs from 11/11/2018 CBC-WNL CMP-WNL THS-WNL, 1.870 B12- WNL, 302 (normal 232-1,245) Lipid Panel- The 10-year ASCVD risk score Mikey Bussing DC Jr., et al., 2013) is: 4.2% Values used to calculate the score:  Age: 76 years  Sex: Female  Is Non-Hispanic African American: No  Diabetic: Yes  Tobacco smoker: No  Systolic Blood Pressure: 458 mmHg  Is BP treated: No  HDL Cholesterol: 53 mg/dL  Total Cholesterol: 158 mg/dL LDL-92 A1c-7.2, bump from 6.4 last yea  Healthcare Maintenance: PAP-Updated today Mammogram-schedule for next week Colonoscopy-UTD, last 2015 LDCT-N/A  Patient Care Team    Relationship Specialty Notifications Start End  Mina Marble D, NP PCP - General Family Medicine  06/01/17   Teena Irani, MD (Inactive) Consulting Physician Gastroenterology  06/02/16   Delrae Rend, MD Consulting Physician Endocrinology  06/02/16   Druscilla Brownie, MD Consulting Physician Dermatology  06/02/16   Nicholas Lose, MD Consulting Physician Hematology and Oncology  06/02/16   Bjorn Loser, MD Consulting Physician Urology  06/02/16   Christain [place], [place] Referring Physician Optometry  05/11/17     Patient Active Problem  List   Diagnosis Date Noted  . Flu-like symptoms 09/26/2018  . Wheezing 09/26/2018  . Cough in adult 06/21/2018  . Vaginal yeast infection 03/28/2018  . Abnormal nuclear stress test   . Chest pain 12/07/2017  . Healthcare maintenance 11/11/2017  . Screening for cervical cancer 11/11/2017  . Personal history UTI- 04/07/17, + Cx- txed with abx 05/18/2017  . Urinary frequency 05/18/2017  . Diarrhea in adult patient 05/11/2017  . UTI (urinary tract infection) 04/07/2017  . Dysuria 04/07/2017  . Abnormal urinalysis 04/07/2017  . Cervical smear, as part of routine gynecological examination 11/09/2016  . Pneumonia 09/23/2016  . Depression 05/28/2016  . B12 deficiency 05/28/2016  . SUI (stress urinary incontinence, female) 05/28/2016  . Hypertriglyceridemia 05/28/2016  . Vaginal atrophy 03/25/2016  . h/o Anxiety 03/08/2016  . Hyperlipidemia 03/04/2016  . Vitamin D insufficiency 03/04/2016  . History of breast cancer 03/04/2016  . Contact dermatitis and eczema due to cause 03/04/2016  . Closed fracture of proximal phalanx of toe 04/03/2014  . Diabetes ([place]) 03/27/2014  . Neoplasm of left breast, primary tumor staging category Tis: ductal carcinoma in situ (DCIS) 02/10/2011     Past Medical History:  Diagnosis Date  . Anxiety   . Diabetes mellitus   . History of breast cancer ONCOLOGIST-- DR Humphrey Rolls--  NO RECURRENCE   S/P LEFT PARTIAL MASTECTOMY W/ SLN BX'S  12-02-2010---  DUCTAL CARCINOMA IN SITU--  S/P RADIATION THERAPY ENDED 02-17-2011  . Hyperlipidemia   . Pneumonia   . PONV (postoperative nausea and vomiting)   . SUI (stress urinary incontinence, female)   . Vitamin D deficiency   .  Vitamin D insufficiency 03/04/2016     Past Surgical History:  Procedure Laterality Date  . BLADDER SUSPENSION N/A 02/03/2013   Procedure: [facility] PROCEDURE;  Surgeon: Reece Packer, MD;  Location: [facility];  Service: Urology;  Laterality: N/A;  . BUNIONECTOMY    . CESAREAN  SECTION     X4  . LEFT HEART CATH AND CORONARY ANGIOGRAPHY N/A 01/26/2018   Procedure: LEFT HEART CATH AND CORONARY ANGIOGRAPHY;  Surgeon: Wellington Hampshire, MD;  Location: [facility];  Service: Cardiovascular;  Laterality: N/A;  . MENISCUS REPAIR Left   . PARTIAL MASTECTOMY WITH AXILLARY SENTINEL LYMPH NODE BIOPSY Left 12-02-2010     Family History  Problem Relation Age of Onset  . Cancer Mother        lung  . Heart attack Father   . COPD Father   . Heart disease Father      Social History   Substance and Sexual Activity  Drug Use No     Social History   Substance and Sexual Activity  Alcohol Use No     Social History   Tobacco Use  Smoking Status Never Smoker  Smokeless Tobacco Never Used     Outpatient Encounter Medications as of 01/17/2019  Medication Sig  . albuterol (PROVENTIL HFA;VENTOLIN HFA) 108 (90 Base) MCG/ACT inhaler Inhale 2 puffs into the lungs every 6 (six) hours as needed for wheezing or shortness of breath.  Marland Kitchen BAYER CONTOUR NEXT TEST test strip Check blood sugar daily  . citalopram (CELEXA) 20 MG tablet 1/2 tab by mouth for 4 weeks, then 1/4 tab by mouth for 4 weeks, then stop medication  . clobetasol ointment (TEMOVATE) 2.44 % Apply 1 application topically 2 (two) times daily.  . fenofibrate 160 MG tablet Take 1 tablet (160 mg total) by mouth daily.  . Fluocinolone Acetonide Body 0.01 % OIL Apply 1 application topically 2 (two) times daily as needed.  . lovastatin (MEVACOR) 40 MG tablet TAKE 1 TABLET BY MOUTH ONCE DAILY AT  6PM.  . oxybutynin (DITROPAN) 5 MG tablet Take 1 tablet by mouth twice daily  . Semaglutide (OZEMPIC) 0.25 or 0.5 MG/DOSE SOPN Inject 0.25 mg into the skin every Sunday.  Marland Kitchen SYNJARDY 12.12-998 MG TABS Take 1 tablet 2 (two) times daily by mouth.   . [DISCONTINUED] oxybutynin (DITROPAN) 5 MG tablet Take 1 tablet by mouth twice daily   Facility-Administered Encounter Medications as of 01/17/2019  Medication  .  ipratropium-albuterol (DUONEB) 0.5-2.5 (3) MG/3ML nebulizer solution 3 mL    Allergies: Compazine and Bydureon [exenatide]  Body mass index is 22.66 kg/m.  Blood pressure 100/63, pulse 61, temperature 98.5 F (36.9 C), temperature source Oral, height 5\' 3"  (1.6 m), weight 127 lb 14.4 oz (58 kg), SpO2 100 %.     Review of Systems  Constitutional: Positive for fatigue. Negative for activity change, appetite change, chills, diaphoresis, fever and unexpected weight change.  HENT: Negative for congestion.   Eyes: Negative for visual disturbance.  Respiratory: Negative for cough, chest tightness, shortness of breath, wheezing and stridor.   Cardiovascular: Negative for chest pain, palpitations and leg swelling.  Gastrointestinal: Negative for abdominal distention, abdominal pain, blood in stool, constipation, diarrhea and nausea.  Endocrine: Negative for cold intolerance, heat intolerance, polydipsia, polyphagia and polyuria.  Genitourinary: Positive for frequency. Negative for difficulty urinating and flank pain.  Musculoskeletal: Positive for arthralgias and neck stiffness. Negative for gait problem, joint swelling, myalgias and neck pain.  Skin: Negative  for color change, pallor, rash and wound.  Allergic/Immunologic: Negative for immunocompromised state.  Neurological: Negative for dizziness, weakness and headaches.  Hematological: Does not bruise/bleed easily.  Psychiatric/Behavioral: Negative for agitation, behavioral problems, confusion, decreased concentration, dysphoric mood, hallucinations, self-injury, sleep disturbance and suicidal ideas. The patient is not nervous/anxious and is not hyperactive.        Objective:   Physical Exam Vitals signs and nursing note reviewed. Exam conducted with a chaperone present.  Constitutional:      General: She is not in acute distress.    Appearance: She is not ill-appearing, toxic-appearing or diaphoretic.  HENT:     Head: Normocephalic  and atraumatic.     Right Ear: Tympanic membrane, ear canal and external ear normal. There is no impacted cerumen.     Left Ear: Tympanic membrane, ear canal and external ear normal. There is no impacted cerumen.     Nose: Nose normal. No congestion or rhinorrhea.     Mouth/Throat:     Mouth: Mucous membranes are moist.     Dentition: Abnormal dentition.     Pharynx: No oropharyngeal exudate or posterior oropharyngeal erythema.     Tonsils: No tonsillar exudate. 1+ on the right. 1+ on the left.     Comments: Clear drainage noted at back throat Eyes:     Extraocular Movements: Extraocular movements intact.     Conjunctiva/sclera: Conjunctivae normal.     Pupils: Pupils are equal, round, and reactive to light.  Neck:     Musculoskeletal: No muscular tenderness.  Cardiovascular:     Rate and Rhythm: Normal rate.     Pulses: Normal pulses.  Pulmonary:     Effort: Pulmonary effort is normal.     Breath sounds: Normal breath sounds.  Chest:     Breasts:        Right: Normal. No nipple discharge or tenderness.        Left: No nipple discharge or tenderness.       Comments: Old, well healed indention from area of infection caused radiation markers during breast ca treatment  Abdominal:     General: Abdomen is flat. Bowel sounds are normal. There is no distension.     Palpations: There is no mass.     Tenderness: There is no abdominal tenderness. There is no right CVA tenderness, left CVA tenderness or guarding.     Hernia: No hernia is present.  Genitourinary:    Pubic Area: No rash.      Vagina: No vaginal discharge.     Comments: Chaperone present during examination. Musculoskeletal: Normal range of motion.  Skin:    General: Skin is warm and dry.     Capillary Refill: Capillary refill takes less than 2 seconds.  Neurological:     Mental Status: She is alert.       Assessment & Plan:   1. Screening for malignant neoplasm of cervix   2. Diabetes mellitus due to underlying  condition with hyperosmolarity without coma, without long-term current use of insulin (HCC)   3. Healthcare maintenance     Diabetes ([place]) She reports AM BG 110-120s, denies episodes of hypoglycemia She is followed by Dr. Kerr/Endocrinology annually, has OV next month- they will fax over her A1c Currently on Semaglutide 0.25mg  once weekly and Synjardy 12.5/1000mg  BID She estimates to drink 40-50oz water/day, follows diabetic diet and leads a quite active life.  Lab Results  Component Value Date   HGBA1C 7.2 (H) 11/11/2018   HGBA1C 6.4 (  H) 11/11/2017   HGBA1C 6.6 11/03/2017   HGBA1C 6.6 11/03/2017    Healthcare maintenance Last A1c was 7.2 on 3./27/2020, we will be looking for you next one with Dr. Buddy Duty next month. Continue all medications as directed. Continue to slowly wean off Citalopram (Celexa). Remain well hydrated, follow diabetic diet, continue your active lifestyle. Follow-up 6 months. Continue to social distance and wear a mask when in public. Will call you when PAP results are available.    FOLLOW-UP:  Return in about 6 months (around 07/19/2019) for Regular Follow Up.

## 2019-01-16 NOTE — Telephone Encounter (Signed)
Patient states that [facility] does not have her current order for her oxybutynin. I advised patient that it looks looks like Garlon Hatchet this refill last week. Patient has gone up to pharm and they said they never got anything from [place]. Can we review order went through correctly please?

## 2019-01-17 ENCOUNTER — Encounter: Payer: Self-pay | Admitting: Adult Health

## 2019-01-17 ENCOUNTER — Other Ambulatory Visit: Payer: Self-pay

## 2019-01-17 ENCOUNTER — Ambulatory Visit (INDEPENDENT_AMBULATORY_CARE_PROVIDER_SITE_OTHER): Payer: Managed Care, Other (non HMO) | Admitting: Adult Health

## 2019-01-17 ENCOUNTER — Other Ambulatory Visit (HOSPITAL_COMMUNITY)
Admission: RE | Admit: 2019-01-17 | Discharge: 2019-01-17 | Disposition: A | Discharge status: Home / Self Care | Payer: Managed Care, Other (non HMO) | Source: Ambulatory Visit | Attending: Adult Health | Admitting: Adult Health

## 2019-01-17 ENCOUNTER — Telehealth: Payer: Self-pay | Admitting: Adult Health

## 2019-01-17 VITALS — BP 100/63 | HR 61 | Temp 98.5°F | Ht 63.0 in | Wt 127.9 lb

## 2019-01-17 DIAGNOSIS — E0800 Diabetes mellitus due to underlying condition with hyperosmolarity without nonketotic hyperglycemic-hyperosmolar coma (NKHHC): Secondary | ICD-10-CM

## 2019-01-17 DIAGNOSIS — Z124 Encounter for screening for malignant neoplasm of cervix: Secondary | ICD-10-CM | POA: Insufficient documentation

## 2019-01-17 DIAGNOSIS — Z0000 Encounter for general adult medical examination without abnormal findings: Secondary | ICD-10-CM | POA: Diagnosis not present

## 2019-01-17 NOTE — Telephone Encounter (Signed)
Please see additional notes in the chart. MPulliam, CMA/RT(R)

## 2019-01-17 NOTE — Patient Instructions (Addendum)
Preventive Care for Adults, Female  A healthy lifestyle and preventive care can promote health and wellness. Preventive health guidelines for women include the following key practices.   A routine yearly physical is a good way to check with your health care provider about your health and preventive screening. It is a chance to share any concerns and updates on your health and to receive a thorough exam.   Visit your dentist for a routine exam and preventive care every 6 months. Brush your teeth twice a day and floss once a day. Good oral hygiene prevents tooth decay and gum disease.   The frequency of eye exams is based on your age, health, family medical history, use of contact lenses, and other factors. Follow your health care provider's recommendations for frequency of eye exams.   Eat a healthy diet. Foods like vegetables, fruits, whole grains, low-fat dairy products, and lean protein foods contain the nutrients you need without too many calories. Decrease your intake of foods high in solid fats, added sugars, and salt. Eat the right amount of calories for you.Get information about a proper diet from your health care provider, if necessary.   Regular physical exercise is one of the most important things you can do for your health. Most adults should get at least 150 minutes of moderate-intensity exercise (any activity that increases your heart rate and causes you to sweat) each week. In addition, most adults need muscle-strengthening exercises on 2 or more days a week.   Maintain a healthy weight. The body mass index (BMI) is a screening tool to identify possible weight problems. It provides an estimate of body fat based on height and weight. Your health care provider can find your BMI, and can help you achieve or maintain a healthy weight.For adults 20 years and older:   - A BMI below 18.5 is considered underweight.   - A BMI of 18.5 to 24.9 is normal.   - A BMI of 25 to 29.9 is  considered overweight.   - A BMI of 30 and above is considered obese.   Maintain normal blood lipids and cholesterol levels by exercising and minimizing your intake of trans and saturated fats.  Eat a balanced diet with plenty of fruit and vegetables. Blood tests for lipids and cholesterol should begin at age 20 and be repeated every 5 years minimum.  If your lipid or cholesterol levels are high, you are over 40, or you are at high risk for heart disease, you may need your cholesterol levels checked more frequently.Ongoing high lipid and cholesterol levels should be treated with medicines if diet and exercise are not working.   If you smoke, find out from your health care provider how to quit. If you do not use tobacco, do not start.   Lung cancer screening is recommended for adults aged 62-80 years who are at high risk for developing lung cancer because of a history of smoking. A yearly low-dose CT scan of the lungs is recommended for people who have at least a 30-pack-year history of smoking and are a current smoker or have quit within the past 15 years. A pack year of smoking is smoking an average of 1 pack of cigarettes a day for 1 year (for example: 1 pack a day for 30 years or 2 packs a day for 15 years). Yearly screening should continue until the smoker has stopped smoking for at least 15 years. Yearly screening should be stopped for people who develop a   health problem that would prevent them from having lung cancer treatment.   If you are pregnant, do not drink alcohol. If you are breastfeeding, be very cautious about drinking alcohol. If you are not pregnant and choose to drink alcohol, do not have more than 1 drink per day. One drink is considered to be 12 ounces (355 mL) of beer, 5 ounces (148 mL) of wine, or 1.5 ounces (44 mL) of liquor.   Avoid use of street drugs. Do not share needles with anyone. Ask for help if you need support or instructions about stopping the use of  drugs.   High blood pressure causes heart disease and increases the risk of stroke. Your blood pressure should be checked at least yearly.  Ongoing high blood pressure should be treated with medicines if weight loss and exercise do not work.   If you are 69-62 years old, ask your health care provider if you should take aspirin to prevent strokes.   Diabetes screening involves taking a blood sample to check your fasting blood sugar level. This should be done once every 3 years, after age 38, if you are within normal weight and without risk factors for diabetes. Testing should be considered at a younger age or be carried out more frequently if you are overweight and have at least 1 risk factor for diabetes.   Breast cancer screening is essential preventive care for women. You should practice "breast self-awareness."  This means understanding the normal appearance and feel of your breasts and may include breast self-examination.  Any changes detected, no matter how small, should be reported to a health care provider.  Women in their 80s and 30s should have a clinical breast exam (CBE) by a health care provider as part of a regular health exam every 1 to 3 years.  After age 66, women should have a CBE every year.  Starting at age 1, women should consider having a mammogram (breast X-ray test) every year.  Women who have a family history of breast cancer should talk to their health care provider about genetic screening.  Women at a high risk of breast cancer should talk to their health care providers about having an MRI and a mammogram every year.   -Breast cancer gene (BRCA)-related cancer risk assessment is recommended for women who have family members with BRCA-related cancers. BRCA-related cancers include breast, ovarian, tubal, and peritoneal cancers. Having family members with these cancers may be associated with an increased risk for harmful changes (mutations) in the breast cancer genes BRCA1 and  BRCA2. Results of the assessment will determine the need for genetic counseling and BRCA1 and BRCA2 testing.   The Pap test is a screening test for cervical cancer. A Pap test can show cell changes on the cervix that might become cervical cancer if left untreated. A Pap test is a procedure in which cells are obtained and examined from the lower end of the uterus (cervix).   - Women should have a Pap test starting at age 57.   - Between ages 90 and 70, Pap tests should be repeated every 2 years.   - Beginning at age 63, you should have a Pap test every 3 years as long as the past 3 Pap tests have been normal.   - Some women have medical problems that increase the chance of getting cervical cancer. Talk to your health care provider about these problems. It is especially important to talk to your health care provider if a  new problem develops soon after your last Pap test. In these cases, your health care provider may recommend more frequent screening and Pap tests.   - The above recommendations are the same for women who have or have not gotten the vaccine for human papillomavirus (HPV).   - If you had a hysterectomy for a problem that was not cancer or a condition that could lead to cancer, then you no longer need Pap tests. Even if you no longer need a Pap test, a regular exam is a good idea to make sure no other problems are starting.   - If you are between ages 36 and 66 years, and you have had normal Pap tests going back 10 years, you no longer need Pap tests. Even if you no longer need a Pap test, a regular exam is a good idea to make sure no other problems are starting.   - If you have had past treatment for cervical cancer or a condition that could lead to cancer, you need Pap tests and screening for cancer for at least 20 years after your treatment.   - If Pap tests have been discontinued, risk factors (such as a new sexual partner) need to be reassessed to determine if screening should  be resumed.   - The HPV test is an additional test that may be used for cervical cancer screening. The HPV test looks for the virus that can cause the cell changes on the cervix. The cells collected during the Pap test can be tested for HPV. The HPV test could be used to screen women aged 70 years and older, and should be used in women of any age who have unclear Pap test results. After the age of 67, women should have HPV testing at the same frequency as a Pap test.   Colorectal cancer can be detected and often prevented. Most routine colorectal cancer screening begins at the age of 57 years and continues through age 26 years. However, your health care provider may recommend screening at an earlier age if you have risk factors for colon cancer. On a yearly basis, your health care provider may provide home test kits to check for hidden blood in the stool.  Use of a small camera at the end of a tube, to directly examine the colon (sigmoidoscopy or colonoscopy), can detect the earliest forms of colorectal cancer. Talk to your health care provider about this at age 23, when routine screening begins. Direct exam of the colon should be repeated every 5 -10 years through age 49 years, unless early forms of pre-cancerous polyps or small growths are found.   People who are at an increased risk for hepatitis B should be screened for this virus. You are considered at high risk for hepatitis B if:  -You were born in a country where hepatitis B occurs often. Talk with your health care provider about which countries are considered high risk.  - Your parents were born in a high-risk country and you have not received a shot to protect against hepatitis B (hepatitis B vaccine).  - You have HIV or AIDS.  - You use needles to inject street drugs.  - You live with, or have sex with, someone who has Hepatitis B.  - You get hemodialysis treatment.  - You take certain medicines for conditions like cancer, organ  transplantation, and autoimmune conditions.   Hepatitis C blood testing is recommended for all people born from [birth year] through 1965 and any individual  with known risks for hepatitis C.   Practice safe sex. Use condoms and avoid high-risk sexual practices to reduce the spread of sexually transmitted infections (STIs). STIs include gonorrhea, chlamydia, syphilis, trichomonas, herpes, HPV, and human immunodeficiency virus (HIV). Herpes, HIV, and HPV are viral illnesses that have no cure. They can result in disability, cancer, and death. Sexually active women aged 25 years and younger should be checked for chlamydia. Older women with new or multiple partners should also be tested for chlamydia. Testing for other STIs is recommended if you are sexually active and at increased risk.   Osteoporosis is a disease in which the bones lose minerals and strength with aging. This can result in serious bone fractures or breaks. The risk of osteoporosis can be identified using a bone density scan. Women ages 65 years and over and women at risk for fractures or osteoporosis should discuss screening with their health care providers. Ask your health care provider whether you should take a calcium supplement or vitamin D to There are also several preventive steps women can take to avoid osteoporosis and resulting fractures or to keep osteoporosis from worsening. -->Recommendations include:  Eat a balanced diet high in fruits, vegetables, calcium, and vitamins.  Get enough calcium. The recommended total intake of is 1,200 mg daily; for best absorption, if taking supplements, divide doses into 250-500 mg doses throughout the day. Of the two types of calcium, calcium carbonate is best absorbed when taken with food but calcium citrate can be taken on an empty stomach.  Get enough vitamin D. NAMS and the National Osteoporosis Foundation recommend at least 1,000 IU per day for women age 50 and over who are at risk of vitamin D  deficiency. Vitamin D deficiency can be caused by inadequate sun exposure (for example, those who live in northern latitudes).  Avoid alcohol and smoking. Heavy alcohol intake (more than 7 drinks per week) increases the risk of falls and hip fracture and women smokers tend to lose bone more rapidly and have lower bone mass than nonsmokers. Stopping smoking is one of the most important changes women can make to improve their health and decrease risk for disease.  Be physically active every day. Weight-bearing exercise (for example, fast walking, hiking, jogging, and weight training) may strengthen bones or slow the rate of bone loss that comes with aging. Balancing and muscle-strengthening exercises can reduce the risk of falling and fracture.  Consider therapeutic medications. Currently, several types of effective drugs are available. Healthcare providers can recommend the type most appropriate for each woman.  Eliminate environmental factors that may contribute to accidents. Falls cause nearly 90% of all osteoporotic fractures, so reducing this risk is an important bone-health strategy. Measures include ample lighting, removing obstructions to walking, using nonskid rugs on floors, and placing mats and/or grab bars in showers.  Be aware of medication side effects. Some common medicines make bones weaker. These include a type of steroid drug called glucocorticoids used for arthritis and asthma, some antiseizure drugs, certain sleeping pills, treatments for endometriosis, and some cancer drugs. An overactive thyroid gland or using too much thyroid hormone for an underactive thyroid can also be a problem. If you are taking these medicines, talk to your doctor about what you can do to help protect your bones.reduce the rate of osteoporosis.    Menopause can be associated with physical symptoms and risks. Hormone replacement therapy is available to decrease symptoms and risks. You should talk to your  health care provider   about whether hormone replacement therapy is right for you.   Use sunscreen. Apply sunscreen liberally and repeatedly throughout the day. You should seek shade when your shadow is shorter than you. Protect yourself by wearing long sleeves, pants, a wide-brimmed hat, and sunglasses year round, whenever you are outdoors.   Once a month, do a whole body skin exam, using a mirror to look at the skin on your back. Tell your health care provider of new moles, moles that have irregular borders, moles that are larger than a pencil eraser, or moles that have changed in shape or color.   -Stay current with required vaccines (immunizations).   Influenza vaccine. All adults should be immunized every year.  Tetanus, diphtheria, and acellular pertussis (Td, Tdap) vaccine. Pregnant women should receive 1 dose of Tdap vaccine during each pregnancy. The dose should be obtained regardless of the length of time since the last dose. Immunization is preferred during the 27th 36th week of gestation. An adult who has not previously received Tdap or who does not know her vaccine status should receive 1 dose of Tdap. This initial dose should be followed by tetanus and diphtheria toxoids (Td) booster doses every 10 years. Adults with an unknown or incomplete history of completing a 3-dose immunization series with Td-containing vaccines should begin or complete a primary immunization series including a Tdap dose. Adults should receive a Td booster every 10 years.  Varicella vaccine. An adult without evidence of immunity to varicella should receive 2 doses or a second dose if she has previously received 1 dose. Pregnant females who do not have evidence of immunity should receive the first dose after pregnancy. This first dose should be obtained before leaving the health care facility. The second dose should be obtained 4 8 weeks after the first dose.  Human papillomavirus (HPV) vaccine. Females aged 13 26  years who have not received the vaccine previously should obtain the 3-dose series. The vaccine is not recommended for use in pregnant females. However, pregnancy testing is not needed before receiving a dose. If a female is found to be pregnant after receiving a dose, no treatment is needed. In that case, the remaining doses should be delayed until after the pregnancy. Immunization is recommended for any person with an immunocompromised condition through the age of 26 years if she did not get any or all doses earlier. During the 3-dose series, the second dose should be obtained 4 8 weeks after the first dose. The third dose should be obtained 24 weeks after the first dose and 16 weeks after the second dose.  Zoster vaccine. One dose is recommended for adults aged 60 years or older unless certain conditions are present.  Measles, mumps, and rubella (MMR) vaccine. Adults born before 1957 generally are considered immune to measles and mumps. Adults born in 1957 or later should have 1 or more doses of MMR vaccine unless there is a contraindication to the vaccine or there is laboratory evidence of immunity to each of the three diseases. A routine second dose of MMR vaccine should be obtained at least 28 days after the first dose for students attending postsecondary schools, health care workers, or international travelers. People who received inactivated measles vaccine or an unknown type of measles vaccine during 1963 1967 should receive 2 doses of MMR vaccine. People who received inactivated mumps vaccine or an unknown type of mumps vaccine before 1979 and are at high risk for mumps infection should consider immunization with 2 doses of   MMR vaccine. For females of childbearing age, rubella immunity should be determined. If there is no evidence of immunity, females who are not pregnant should be vaccinated. If there is no evidence of immunity, females who are pregnant should delay immunization until after pregnancy.  Unvaccinated health care workers born before [birth year] who lack laboratory evidence of measles, mumps, or rubella immunity or laboratory confirmation of disease should consider measles and mumps immunization with 2 doses of MMR vaccine or rubella immunization with 1 dose of MMR vaccine.  Pneumococcal 13-valent conjugate (PCV13) vaccine. When indicated, a person who is uncertain of her immunization history and has no record of immunization should receive the PCV13 vaccine. An adult aged 54 years or older who has certain medical conditions and has not been previously immunized should receive 1 dose of PCV13 vaccine. This PCV13 should be followed with a dose of pneumococcal polysaccharide (PPSV23) vaccine. The PPSV23 vaccine dose should be obtained at least 8 weeks after the dose of PCV13 vaccine. An adult aged 58 years or older who has certain medical conditions and previously received 1 or more doses of PPSV23 vaccine should receive 1 dose of PCV13. The PCV13 vaccine dose should be obtained 1 or more years after the last PPSV23 vaccine dose.  Pneumococcal polysaccharide (PPSV23) vaccine. When PCV13 is also indicated, PCV13 should be obtained first. All adults aged 58 years and older should be immunized. An adult younger than age 65 years who has certain medical conditions should be immunized. Any person who resides in a nursing home or long-term care facility should be immunized. An adult smoker should be immunized. People with an immunocompromised condition and certain other conditions should receive both PCV13 and PPSV23 vaccines. People with human immunodeficiency virus (HIV) infection should be immunized as soon as possible after diagnosis. Immunization during chemotherapy or radiation therapy should be avoided. Routine use of PPSV23 vaccine is not recommended for American Indians, [place] Natives, or people younger than 65 years unless there are medical conditions that require PPSV23 vaccine. When indicated,  people who have unknown immunization and have no record of immunization should receive PPSV23 vaccine. One-time revaccination 5 years after the first dose of PPSV23 is recommended for people aged 70 64 years who have chronic kidney failure, nephrotic syndrome, asplenia, or immunocompromised conditions. People who received 1 2 doses of PPSV23 before age 32 years should receive another dose of PPSV23 vaccine at age 96 years or later if at least 5 years have passed since the previous dose. Doses of PPSV23 are not needed for people immunized with PPSV23 at or after age 62 years.  Meningococcal vaccine. Adults with asplenia or persistent complement component deficiencies should receive 2 doses of quadrivalent meningococcal conjugate (MenACWY-D) vaccine. The doses should be obtained at least 2 months apart. Microbiologists working with certain meningococcal bacteria, [employer] recruits, people at risk during an outbreak, and people who travel to or live in countries with a high rate of meningitis should be immunized. A first-year college student up through age 58 years who is living in a residence hall should receive a dose if she did not receive a dose on or after her 16th birthday. Adults who have certain high-risk conditions should receive one or more doses of vaccine.  Hepatitis A vaccine. Adults who wish to be protected from this disease, have certain high-risk conditions, work with hepatitis A-infected animals, work in hepatitis A research labs, or travel to or work in countries with a high rate of hepatitis A should be  immunized. Adults who were previously unvaccinated and who anticipate close contact with an international adoptee during the first 60 days after arrival in the [place] from a country with a high rate of hepatitis A should be immunized.  Hepatitis B vaccine.  Adults who wish to be protected from this disease, have certain high-risk conditions, may be exposed to blood or other infectious  body fluids, are household contacts or sex partners of hepatitis B positive people, are clients or workers in certain care facilities, or travel to or work in countries with a high rate of hepatitis B should be immunized.  Haemophilus influenzae type b (Hib) vaccine. A previously unvaccinated person with asplenia or sickle cell disease or having a scheduled splenectomy should receive 1 dose of Hib vaccine. Regardless of previous immunization, a recipient of a hematopoietic stem cell transplant should receive a 3-dose series 6 12 months after her successful transplant. Hib vaccine is not recommended for adults with HIV infection.  Preventive Services / Frequency Ages 6 to 39years  Blood pressure check.** / Every 1 to 2 years.  Lipid and cholesterol check.** / Every 5 years beginning at age 39.  Clinical breast exam.** / Every 3 years for women in their 61s and 62s.  BRCA-related cancer risk assessment.** / For women who have family members with a BRCA-related cancer (breast, ovarian, tubal, or peritoneal cancers).  Pap test.** / Every 2 years from ages 47 through 85. Every 3 years starting at age 34 through age 12 or 74 with a history of 3 consecutive normal Pap tests.  HPV screening.** / Every 3 years from ages 46 through ages 43 to 54 with a history of 3 consecutive normal Pap tests.  Hepatitis C blood test.** / For any individual with known risks for hepatitis C.  Skin self-exam. / Monthly.  Influenza vaccine. / Every year.  Tetanus, diphtheria, and acellular pertussis (Tdap, Td) vaccine.** / Consult your health care provider. Pregnant women should receive 1 dose of Tdap vaccine during each pregnancy. 1 dose of Td every 10 years.  Varicella vaccine.** / Consult your health care provider. Pregnant females who do not have evidence of immunity should receive the first dose after pregnancy.  HPV vaccine. / 3 doses over 6 months, if 64 and younger. The vaccine is not recommended for use in  pregnant females. However, pregnancy testing is not needed before receiving a dose.  Measles, mumps, rubella (MMR) vaccine.** / You need at least 1 dose of MMR if you were born in 1957 or later. You may also need a 2nd dose. For females of childbearing age, rubella immunity should be determined. If there is no evidence of immunity, females who are not pregnant should be vaccinated. If there is no evidence of immunity, females who are pregnant should delay immunization until after pregnancy.  Pneumococcal 13-valent conjugate (PCV13) vaccine.** / Consult your health care provider.  Pneumococcal polysaccharide (PPSV23) vaccine.** / 1 to 2 doses if you smoke cigarettes or if you have certain conditions.  Meningococcal vaccine.** / 1 dose if you are age 71 to 37 years and a Market researcher living in a residence hall, or have one of several medical conditions, you need to get vaccinated against meningococcal disease. You may also need additional booster doses.  Hepatitis A vaccine.** / Consult your health care provider.  Hepatitis B vaccine.** / Consult your health care provider.  Haemophilus influenzae type b (Hib) vaccine.** / Consult your health care provider.  Ages 55 to 64years  Blood pressure check.** / Every 1 to 2 years.  Lipid and cholesterol check.** / Every 5 years beginning at age 20 years.  Lung cancer screening. / Every year if you are aged 62 80 years and have a 30-pack-year history of smoking and currently smoke or have quit within the past 15 years. Yearly screening is stopped once you have quit smoking for at least 15 years or develop a health problem that would prevent you from having lung cancer treatment.  Clinical breast exam.** / Every year after age 40 years.  BRCA-related cancer risk assessment.** / For women who have family members with a BRCA-related cancer (breast, ovarian, tubal, or peritoneal cancers).  Mammogram.** / Every year beginning at age 40  years and continuing for as long as you are in good health. Consult with your health care provider.  Pap test.** / Every 3 years starting at age 30 years through age 65 or 70 years with a history of 3 consecutive normal Pap tests.  HPV screening.** / Every 3 years from ages 30 years through ages 65 to 70 years with a history of 3 consecutive normal Pap tests.  Fecal occult blood test (FOBT) of stool. / Every year beginning at age 50 years and continuing until age 75 years. You may not need to do this test if you get a colonoscopy every 10 years.  Flexible sigmoidoscopy or colonoscopy.** / Every 5 years for a flexible sigmoidoscopy or every 10 years for a colonoscopy beginning at age 50 years and continuing until age 75 years.  Hepatitis C blood test.** / For all people born from 1945 through 1965 and any individual with known risks for hepatitis C.  Skin self-exam. / Monthly.  Influenza vaccine. / Every year.  Tetanus, diphtheria, and acellular pertussis (Tdap/Td) vaccine.** / Consult your health care provider. Pregnant women should receive 1 dose of Tdap vaccine during each pregnancy. 1 dose of Td every 10 years.  Varicella vaccine.** / Consult your health care provider. Pregnant females who do not have evidence of immunity should receive the first dose after pregnancy.  Zoster vaccine.** / 1 dose for adults aged 60 years or older.  Measles, mumps, rubella (MMR) vaccine.** / You need at least 1 dose of MMR if you were born in 1957 or later. You may also need a 2nd dose. For females of childbearing age, rubella immunity should be determined. If there is no evidence of immunity, females who are not pregnant should be vaccinated. If there is no evidence of immunity, females who are pregnant should delay immunization until after pregnancy.  Pneumococcal 13-valent conjugate (PCV13) vaccine.** / Consult your health care provider.  Pneumococcal polysaccharide (PPSV23) vaccine.** / 1 to 2 doses if  you smoke cigarettes or if you have certain conditions.  Meningococcal vaccine.** / Consult your health care provider.  Hepatitis A vaccine.** / Consult your health care provider.  Hepatitis B vaccine.** / Consult your health care provider.  Haemophilus influenzae type b (Hib) vaccine.** / Consult your health care provider.  Ages 65 years and over  Blood pressure check.** / Every 1 to 2 years.  Lipid and cholesterol check.** / Every 5 years beginning at age 20 years.  Lung cancer screening. / Every year if you are aged 62 80 years and have a 30-pack-year history of smoking and currently smoke or have quit within the past 15 years. Yearly screening is stopped once you have quit smoking for at least 15 years or develop a health problem that   would prevent you from having lung cancer treatment.  Clinical breast exam.** / Every year after age 103 years.  BRCA-related cancer risk assessment.** / For women who have family members with a BRCA-related cancer (breast, ovarian, tubal, or peritoneal cancers).  Mammogram.** / Every year beginning at age 36 years and continuing for as long as you are in good health. Consult with your health care provider.  Pap test.** / Every 3 years starting at age 5 years through age 85 or 10 years with 3 consecutive normal Pap tests. Testing can be stopped between 65 and 70 years with 3 consecutive normal Pap tests and no abnormal Pap or HPV tests in the past 10 years.  HPV screening.** / Every 3 years from ages 93 years through ages 70 or 45 years with a history of 3 consecutive normal Pap tests. Testing can be stopped between 65 and 70 years with 3 consecutive normal Pap tests and no abnormal Pap or HPV tests in the past 10 years.  Fecal occult blood test (FOBT) of stool. / Every year beginning at age 8 years and continuing until age 45 years. You may not need to do this test if you get a colonoscopy every 10 years.  Flexible sigmoidoscopy or colonoscopy.** /  Every 5 years for a flexible sigmoidoscopy or every 10 years for a colonoscopy beginning at age 69 years and continuing until age 68 years.  Hepatitis C blood test.** / For all people born from [birth year] through 1964 and any individual with known risks for hepatitis C.  Osteoporosis screening.** / A one-time screening for women ages 7 years and over and women at risk for fractures or osteoporosis.  Skin self-exam. / Monthly.  Influenza vaccine. / Every year.  Tetanus, diphtheria, and acellular pertussis (Tdap/Td) vaccine.** / 1 dose of Td every 10 years.  Varicella vaccine.** / Consult your health care provider.  Zoster vaccine.** / 1 dose for adults aged 5 years or older.  Pneumococcal 13-valent conjugate (PCV13) vaccine.** / Consult your health care provider.  Pneumococcal polysaccharide (PPSV23) vaccine.** / 1 dose for all adults aged 74 years and older.  Meningococcal vaccine.** / Consult your health care provider.  Hepatitis A vaccine.** / Consult your health care provider.  Hepatitis B vaccine.** / Consult your health care provider.  Haemophilus influenzae type b (Hib) vaccine.** / Consult your health care provider. ** Family history and personal history of risk and conditions may change your health care provider's recommendations. Document Released: 09/29/2001 Document Revised: 05/24/2013  [facility] Patient Information 2014 [place], [place].   EXERCISE AND DIET:  We recommended that you start or continue a regular exercise program for good health. Regular exercise means any activity that makes your heart beat faster and makes you sweat.  We recommend exercising at least 30 minutes per day at least 3 days a week, preferably 5.  We also recommend a diet low in fat and sugar / carbohydrates.  Inactivity, poor dietary choices and obesity can cause diabetes, heart attack, stroke, and kidney damage, among others.     ALCOHOL AND SMOKING:  Women should limit their alcohol intake to no  more than 7 drinks/beers/glasses of wine (combined, not each!) per week. Moderation of alcohol intake to this level decreases your risk of breast cancer and liver damage.  ( And of course, no recreational drugs are part of a healthy lifestyle.)  Also, you should not be smoking at all or even being exposed to second hand smoke. Most people know smoking can  cause cancer, and various heart and lung diseases, but did you know it also contributes to weakening of your bones?  Aging of your skin?  Yellowing of your teeth and nails?   CALCIUM AND VITAMIN D:  Adequate intake of calcium and Vitamin D are recommended.  The recommendations for exact amounts of these supplements seem to change often, but generally speaking 600 mg of calcium (either carbonate or citrate) and 800 units of Vitamin D per day seems prudent. Certain women may benefit from higher intake of Vitamin D.  If you are among these women, your doctor will have told you during your visit.     PAP SMEARS:  Pap smears, to check for cervical cancer or precancers,  have traditionally been done yearly, although recent scientific advances have shown that most women can have pap smears less often.  However, every woman still should have a physical exam from her gynecologist or primary care physician every year. It will include a breast check, inspection of the vulva and vagina to check for abnormal growths or skin changes, a visual exam of the cervix, and then an exam to evaluate the size and shape of the uterus and ovaries.  And after 62 years of age, a rectal exam is indicated to check for rectal cancers. We will also provide age appropriate advice regarding health maintenance, like when you should have certain vaccines, screening for sexually transmitted diseases, bone density testing, colonoscopy, mammograms, etc.    MAMMOGRAMS:  All women over 5 years old should have a yearly mammogram. Many facilities now offer a "3D" mammogram, which may cost  around $50 extra out of pocket. If possible,  we recommend you accept the option to have the 3D mammogram performed.  It both reduces the number of women who will be called back for extra views which then turn out to be normal, and it is better than the routine mammogram at detecting truly abnormal areas.     COLONOSCOPY:  Colonoscopy to screen for colon cancer is recommended for all women at age 67.  We know, you hate the idea of the prep.  We agree, BUT, having colon cancer and not knowing it is worse!!  Colon cancer so often starts as a polyp that can be seen and removed at colonscopy, which can quite literally save your life!  And if your first colonoscopy is normal and you have no family history of colon cancer, most women don't have to have it again for 10 years.  Once every ten years, you can do something that may end up saving your life, right?  We will be happy to help you get it scheduled when you are ready.  Be sure to check your insurance coverage so you understand how much it will cost.  It may be covered as a preventative service at no cost, but you should check your particular policy.    Last A1c was 7.2 on 3./27/2020, we will be looking for you next one with Dr. Buddy Duty next month. Continue all medications as directed. Continue to slowly wean off Citalopram (Celexa). Remain well hydrated, follow diabetic diet, continue your active lifestyle. Follow-up 6 months. Continue to social distance and wear a mask when in public. Will call you when PAP results are available. NICE TO SEE YOU!

## 2019-01-17 NOTE — Assessment & Plan Note (Signed)
Last A1c was 7.2 on 3./27/2020, we will be looking for you next one with Dr. Buddy Duty next month. Continue all medications as directed. Continue to slowly wean off Citalopram (Celexa). Remain well hydrated, follow diabetic diet, continue your active lifestyle. Follow-up 6 months. Continue to social distance and wear a mask when in public. Will call you when PAP results are available.

## 2019-01-17 NOTE — Assessment & Plan Note (Signed)
She reports AM BG 110-120s, denies episodes of hypoglycemia She is followed by Dr. Kerr/Endocrinology annually, has OV next month- they will fax over her A1c Currently on Semaglutide 0.25mg  once weekly and Synjardy 12.5/1000mg  BID She estimates to drink 40-50oz water/day, follows diabetic diet and leads a quite active life.  Lab Results  Component Value Date   HGBA1C 7.2 (H) 11/11/2018   HGBA1C 6.4 (H) 11/11/2017   HGBA1C 6.6 11/03/2017   HGBA1C 6.6 11/03/2017

## 2019-01-17 NOTE — Telephone Encounter (Signed)
Patient states that [facility] said they never received our request for a 90 day supply of her oxybutynin, so she discussed with Valetta Fuller as she already had an appt coming up. A new order was issued to [facility] but only for a 30 day supply, not a 90 day supply. She is requesting this be corrected and a new 90 day supply be sent to [facility]. She is also requesting a refill for this if possible. Please advise

## 2019-01-18 ENCOUNTER — Encounter: Payer: Self-pay | Admitting: Adult Health

## 2019-01-23 LAB — CYTOLOGY - PAP
Diagnosis: NEGATIVE
HPV: NOT DETECTED

## 2019-01-24 MED ORDER — OXYBUTYNIN CHLORIDE 5 MG PO TABS: 5.0000 mg | ORAL_TABLET | Freq: Two times a day (BID) | ORAL | 0 refills | Status: DC

## 2019-01-24 NOTE — Addendum Note (Signed)
Addended by: Fonnie Mu on: 01/24/2019 10:22 AM   Modules accepted: Orders

## 2019-01-26 ENCOUNTER — Ambulatory Visit
Admission: RE | Admit: 2019-01-26 | Discharge: 2019-01-26 | Disposition: A | Discharge status: Home / Self Care | Payer: Managed Care, Other (non HMO) | Source: Ambulatory Visit | Attending: Adult Health | Admitting: Adult Health

## 2019-01-26 ENCOUNTER — Other Ambulatory Visit: Payer: Self-pay

## 2019-01-26 DIAGNOSIS — Z1231 Encounter for screening mammogram for malignant neoplasm of breast: Secondary | ICD-10-CM

## 2019-02-13 ENCOUNTER — Telehealth: Payer: Self-pay | Admitting: Adult Health

## 2019-02-13 NOTE — Telephone Encounter (Signed)
LVM for pt informing her that we will address her issues at her OV on 02/14/2019.  Charyl Bigger, CMA

## 2019-02-13 NOTE — Telephone Encounter (Signed)
Patient called requested PCP call her to discuss withdrawal symptoms of CELEXA, says very emotional (crying a lot!!) ---Forwarding request to medical assistant for review w/ provider.  ---ALSO booked pt for a TELEHEALTH APPT  Tomorrow @ 8:45am --glh

## 2019-02-13 NOTE — Progress Notes (Signed)
Virtual Visit via Telephone Note  I connected with Rebecca Robertson on 02/13/29 at  8:45 AM EDT by telephone and verified that I am speaking with the correct person using two identifiers.  Location: Patient:Home  Provider: In Clinic   I discussed the limitations, risks, security and privacy concerns of performing an evaluation and management service by telephone and the availability of in person appointments. I also discussed with the patient that there may be a patient responsible charge related to this service. The patient expressed understanding and agreed to proceed.   History of Present Illness: Rebecca Robertson calls in with concerns of emotional liability. She recently weaned off Citalopram (Celexa)- last dose >2 weeks ago. She reports that her son and his wife was scheduled to come for a visit from [place] 18 June- 23 June- trip cancelled due to COVID-19 She celebrated her 30th anniversary yesterday- she had to cancel celebration. She states "I am so tired of having to stay home all the time due to this virus". She reports low energy, lack of interest, increased fatigue. She reports eating and hydrating well, reports sound sleep. She denies any ETOH use She is really not interested in re-starting a SSRI or SNRI She declined referral to [facility] She reports strong support system- husband, family, and local friends.   Depression screen [facility] 2/9 02/14/2019 01/17/2019 11/24/2018  Decreased Interest 1 0 0  Down, Depressed, Hopeless 0 0 0  PHQ - 2 Score 1 0 0  Altered sleeping 0 0 0  Tired, decreased energy 1 0 0  Change in appetite 1 0 0  Feeling bad or failure about yourself  0 0 0  Trouble concentrating 1 0 0  Moving slowly or fidgety/restless 0 0 0  Suicidal thoughts 0 0 0  PHQ-9 Score 4 0 0  Difficult doing work/chores Somewhat difficult - -  Some recent data might be hidden    Patient Care Team    Relationship Specialty Notifications Start End  Mina Marble D, NP PCP - General  Family Medicine  06/01/17   Teena Irani, MD (Inactive) Consulting Physician Gastroenterology  06/02/16   Delrae Rend, MD Consulting Physician Endocrinology  06/02/16   Druscilla Brownie, MD Consulting Physician Dermatology  06/02/16   Nicholas Lose, MD Consulting Physician Hematology and Oncology  06/02/16   Bjorn Loser, MD Consulting Physician Urology  06/02/16   Christain Sacramento, [place] Referring Physician Optometry  05/11/17     Patient Active Problem List   Diagnosis Date Noted  . Flu-like symptoms 09/26/2018  . Wheezing 09/26/2018  . Cough in adult 06/21/2018  . Vaginal yeast infection 03/28/2018  . Abnormal nuclear stress test   . Chest pain 12/07/2017  . Healthcare maintenance 11/11/2017  . Screening for cervical cancer 11/11/2017  . Personal history UTI- 04/07/17, + Cx- txed with abx 05/18/2017  . Urinary frequency 05/18/2017  . Diarrhea in adult patient 05/11/2017  . UTI (urinary tract infection) 04/07/2017  . Dysuria 04/07/2017  . Abnormal urinalysis 04/07/2017  . Cervical smear, as part of routine gynecological examination 11/09/2016  . Pneumonia 09/23/2016  . Depression 05/28/2016  . B12 deficiency 05/28/2016  . SUI (stress urinary incontinence, female) 05/28/2016  . Hypertriglyceridemia 05/28/2016  . Vaginal atrophy 03/25/2016  . h/o Anxiety 03/08/2016  . Hyperlipidemia 03/04/2016  . Vitamin D insufficiency 03/04/2016  . History of breast cancer 03/04/2016  . Contact dermatitis and eczema due to cause 03/04/2016  . Closed fracture of proximal phalanx of toe 04/03/2014  . Diabetes (  [place]) 03/27/2014  . Neoplasm of left breast, primary tumor staging category Tis: ductal carcinoma in situ (DCIS) 02/10/2011     Past Medical History:  Diagnosis Date  . Anxiety   . Diabetes mellitus   . History of breast cancer ONCOLOGIST-- DR Humphrey Rolls--  NO RECURRENCE   S/P LEFT PARTIAL MASTECTOMY W/ SLN BX'S  12-02-2010---  DUCTAL CARCINOMA IN SITU--  S/P RADIATION THERAPY ENDED  02-17-2011  . Hyperlipidemia   . Personal history of radiation therapy 2012  . Pneumonia   . PONV (postoperative nausea and vomiting)   . SUI (stress urinary incontinence, female)   . Vitamin D deficiency   . Vitamin D insufficiency 03/04/2016     Past Surgical History:  Procedure Laterality Date  . BLADDER SUSPENSION N/A 02/03/2013   Procedure: [facility] PROCEDURE;  Surgeon: Reece Packer, MD;  [facility]: [facility];  Service: Urology;  Laterality: N/A;  . BREAST LUMPECTOMY Left 2012  . BUNIONECTOMY    . CESAREAN SECTION     X4  . LEFT HEART CATH AND CORONARY ANGIOGRAPHY N/A 01/26/2018   Procedure: LEFT HEART CATH AND CORONARY ANGIOGRAPHY;  Surgeon: Wellington Hampshire, MD;  Location: [facility];  Service: Cardiovascular;  Laterality: N/A;  . MENISCUS REPAIR Left   . PARTIAL MASTECTOMY WITH AXILLARY SENTINEL LYMPH NODE BIOPSY Left 12-02-2010     Family History  Problem Relation Age of Onset  . Cancer Mother        lung  . Heart attack Father   . COPD Father   . Heart disease Father      Social History   Substance and Sexual Activity  Drug Use No     Social History   Substance and Sexual Activity  Alcohol Use No     Social History   Tobacco Use  Smoking Status Never Smoker  Smokeless Tobacco Never Used     Outpatient Encounter Medications as of 02/14/2019  Medication Sig  . albuterol (PROVENTIL HFA;VENTOLIN HFA) 108 (90 Base) MCG/ACT inhaler Inhale 2 puffs into the lungs every 6 (six) hours as needed for wheezing or shortness of breath.  Marland Kitchen BAYER CONTOUR NEXT TEST test strip Check blood sugar daily  . citalopram (CELEXA) 20 MG tablet 1/2 tab by mouth for 4 weeks, then 1/4 tab by mouth for 4 weeks, then stop medication  . clobetasol ointment (TEMOVATE) 6.83 % Apply 1 application topically 2 (two) times daily.  . fenofibrate 160 MG tablet Take 1 tablet (160 mg total) by mouth daily.  . Fluocinolone Acetonide Body 0.01 % OIL Apply 1  application topically 2 (two) times daily as needed.  . lovastatin (MEVACOR) 40 MG tablet TAKE 1 TABLET BY MOUTH ONCE DAILY AT  6PM.  . oxybutynin (DITROPAN) 5 MG tablet Take 1 tablet (5 mg total) by mouth 2 (two) times daily.  . Semaglutide (OZEMPIC) 0.25 or 0.5 MG/DOSE SOPN Inject 0.25 mg into the skin every Sunday.  Marland Kitchen SYNJARDY 12.12-998 MG TABS Take 1 tablet 2 (two) times daily by mouth.    Facility-Administered Encounter Medications as of 02/14/2019  Medication  . ipratropium-albuterol (DUONEB) 0.5-2.5 (3) MG/3ML nebulizer solution 3 mL    Allergies: Compazine and Bydureon [exenatide]  There is no height or weight on file to calculate BMI.  There were no vitals taken for this visit. Review of Systems: General:   Denies fever, chills, unexplained weight loss.  Optho/Auditory:   Denies visual changes, blurred vision/LOV Respiratory:   Denies SOB, DOE more  than baseline levels.  Cardiovascular:   Denies chest pain, palpitations, new onset peripheral edema  Gastrointestinal:   Denies nausea, vomiting, diarrhea.  Genitourinary: Denies dysuria, freq/ urgency, flank pain or discharge from genitals.  Endocrine:     Denies hot or cold intolerance, polyuria, polydipsia. Musculoskeletal:   Denies unexplained myalgias, joint swelling, unexplained arthralgias, gait problems.  Skin:  Denies rash, suspicious lesions Neurological:     Denies dizziness, unexplained weakness, numbness  Psychiatric/Behavioral: mood changes+, Denies suicidal or homicidal ideations, hallucinations This patient does not have sx concerning for COVID-19 Infection (ie; fever, chills, cough, new or worsening shortness of breath).  Observations/Objective: No acute distress noted during the telephone conversation. She laughed frequently during the conversation.  Assessment and Plan: Exercise 15 mins BID- walking,biking, swimming, yoga YouTube/Pinterest workout videos. Get outdoors at least 20 mins/day- with sun  protection. Continue to avoid ETOH Recommend daily meditation Remain off SSRI/SNRI If you developed any thoughts of harming yourself/others-seek immediate medical assistance- pt verbalized understanding/agreement. Follow Up Instructions: TeleMedicine Appt in 4 weeks, sooner if needed.   I discussed the assessment and treatment plan with the patient. The patient was provided an opportunity to ask questions and all were answered. The patient agreed with the plan and demonstrated an understanding of the instructions.   The patient was advised to call back or seek an in-person evaluation if the symptoms worsen or if the condition fails to improve as anticipated.  I provided 25 minutes of non-face-to-face time during this encounter.   Esaw Grandchild, NP

## 2019-02-14 ENCOUNTER — Encounter: Payer: Self-pay | Admitting: Adult Health

## 2019-02-14 ENCOUNTER — Other Ambulatory Visit: Payer: Self-pay

## 2019-02-14 ENCOUNTER — Ambulatory Visit (INDEPENDENT_AMBULATORY_CARE_PROVIDER_SITE_OTHER): Payer: Managed Care, Other (non HMO) | Admitting: Adult Health

## 2019-02-14 DIAGNOSIS — F329 Major depressive disorder, single episode, unspecified: Secondary | ICD-10-CM | POA: Diagnosis not present

## 2019-02-14 DIAGNOSIS — F32A Depression, unspecified: Secondary | ICD-10-CM

## 2019-02-14 NOTE — Assessment & Plan Note (Signed)
Assessment and Plan: Exercise 15 mins BID- walking,biking, swimming, yoga YouTube/Pinterest workout videos. Get outdoors at least 20 mins/day- with sun protection. Continue to avoid ETOH Recommend daily meditation Remain off SSRI/SNRI If you developed any thoughts of harming yourself/others-seek immediate medical assistance- pt verbalized understanding/agreement. Follow Up Instructions: TeleMedicine Appt in 4 weeks, sooner if needed.   I discussed the assessment and treatment plan with the patient. The patient was provided an opportunity to ask questions and all were answered. The patient agreed with the plan and demonstrated an understanding of the instructions.   The patient was advised to call back or seek an in-person evaluation if the symptoms worsen or if the condition fails to improve as anticipated.

## 2019-03-02 ENCOUNTER — Telehealth: Payer: Self-pay | Admitting: Adult Health

## 2019-03-02 NOTE — Telephone Encounter (Signed)
Pt states that she feels very tired and doesn't feel like doing anything, but she pushes herself to walk her dog once daily.  Pt had telemedicine visit on 02/14/2019 for depression and was to have a f/u telemedicine visit on 03/13/2019.  Pt denies any thoughts of harming self or others.  Advised pt to move her f/u appt to next week for [facility] to address ongoing issues.  Pt expressed understanding and is agreeable.  Charyl Bigger, CMA

## 2019-03-02 NOTE — Telephone Encounter (Signed)
Patient states she has talked to [facility] at a recent OV about not "feeling well" and having no energy, she states she was advised by Valetta Fuller to give it a few weeks and if this continued to contact our office.

## 2019-03-08 ENCOUNTER — Ambulatory Visit: Payer: Managed Care, Other (non HMO) | Admitting: Adult Health

## 2019-03-13 ENCOUNTER — Ambulatory Visit: Payer: Managed Care, Other (non HMO) | Admitting: Adult Health

## 2019-03-15 LAB — HEMOGLOBIN A1C: Hemoglobin A1C: 7.3

## 2019-03-15 LAB — MICROALBUMIN, URINE: Microalb, Ur: 1.05

## 2019-03-26 NOTE — Progress Notes (Signed)
Subjective:    Patient ID: Rebecca [facility], female    DOB: [birth year], 63 y.o.   MRN: [id number]  HPI:  Ms. Rebecca [facility] presents with several complaints: Increased fatigue, poor appetite, weight loss-  Current wt 121, last wt 125 at 02/14/2019 OV 4 lbs wt loss Body mass index is 21.59 kg/m.  She was slowly tapered off citalopram (Celexa) 20mg  the last 3 months. She was on this SSRI since 2012 She reports loose stools the last 5 days if she consumes a "full meal" She denies abdominal pain, N/V, hematochezia/hematouria She reports being tearful and states "I just don't feel like doing anything". She vehemently denies SI/HI She reports excellent support system- husband and 2 of her 3 children She denies tobacco/vape/ETOH use She walks her dog twice daily and drink >60 oz water/day She was recently seen by Endo/Dr. Buddy Duty- started on oral B12 supplements and Fenofibrate was d/c'd   11/11/2018  CBC-stable TSH-1.870  Patient Care Team    Relationship Specialty Notifications Start End  Mina Marble D, NP PCP - General Family Medicine  06/01/17   Teena Irani, MD (Inactive) Consulting Physician Gastroenterology  06/02/16   Delrae Rend, MD Consulting Physician Endocrinology  06/02/16   Druscilla Brownie, MD Consulting Physician Dermatology  06/02/16   Nicholas Lose, MD Consulting Physician Hematology and Oncology  06/02/16   Bjorn Loser, MD Consulting Physician Urology  06/02/16   Christain Sacramento, [place] Referring Physician Optometry  05/11/17     Patient Active Problem List   Diagnosis Date Noted  . Fatigue 03/27/2019  . Flu-like symptoms 09/26/2018  . Wheezing 09/26/2018  . Cough in adult 06/21/2018  . Vaginal yeast infection 03/28/2018  . Abnormal nuclear stress test   . Chest pain 12/07/2017  . Healthcare maintenance 11/11/2017  . Screening for cervical cancer 11/11/2017  . Personal history UTI- 04/07/17, + Cx- txed with abx 05/18/2017  . Urinary frequency 05/18/2017  .  Diarrhea in adult patient 05/11/2017  . UTI (urinary tract infection) 04/07/2017  . Dysuria 04/07/2017  . Abnormal urinalysis 04/07/2017  . Cervical smear, as part of routine gynecological examination 11/09/2016  . Pneumonia 09/23/2016  . Depression 05/28/2016  . B12 deficiency 05/28/2016  . SUI (stress urinary incontinence, female) 05/28/2016  . Hypertriglyceridemia 05/28/2016  . Vaginal atrophy 03/25/2016  . h/o Anxiety 03/08/2016  . Hyperlipidemia 03/04/2016  . Vitamin D insufficiency 03/04/2016  . History of breast cancer 03/04/2016  . Contact dermatitis and eczema due to cause 03/04/2016  . Closed fracture of proximal phalanx of toe 04/03/2014  . Diabetes ([place]) 03/27/2014  . Neoplasm of left breast, primary tumor staging category Tis: ductal carcinoma in situ (DCIS) 02/10/2011     Past Medical History:  Diagnosis Date  . Anxiety   . Diabetes mellitus   . History of breast cancer ONCOLOGIST-- DR Humphrey Rolls--  NO RECURRENCE   S/P LEFT PARTIAL MASTECTOMY W/ SLN BX'S  12-02-2010---  DUCTAL CARCINOMA IN SITU--  S/P RADIATION THERAPY ENDED 02-17-2011  . Hyperlipidemia   . Personal history of radiation therapy 2012  . Pneumonia   . PONV (postoperative nausea and vomiting)   . SUI (stress urinary incontinence, female)   . Vitamin D deficiency   . Vitamin D insufficiency 03/04/2016     Past Surgical History:  Procedure Laterality Date  . BLADDER SUSPENSION N/A 02/03/2013   Procedure: [facility] PROCEDURE;  Surgeon: Reece Packer, MD;  Location: [facility];  Service: Urology;  Laterality: N/A;  . BREAST LUMPECTOMY Left  2012  . BUNIONECTOMY    . CESAREAN SECTION     X4  . LEFT HEART CATH AND CORONARY ANGIOGRAPHY N/A 01/26/2018   Procedure: LEFT HEART CATH AND CORONARY ANGIOGRAPHY;  Surgeon: Wellington Hampshire, MD;  Location: [facility];  Service: Cardiovascular;  Laterality: N/A;  . MENISCUS REPAIR Left   . PARTIAL MASTECTOMY WITH AXILLARY SENTINEL LYMPH NODE  BIOPSY Left 12-02-2010     Family History  Problem Relation Age of Onset  . Cancer Mother        lung  . Heart attack Father   . COPD Father   . Heart disease Father      Social History   Substance and Sexual Activity  Drug Use No     Social History   Substance and Sexual Activity  Alcohol Use No     Social History   Tobacco Use  Smoking Status Never Smoker  Smokeless Tobacco Never Used     Outpatient Encounter Medications as of 03/27/2019  Medication Sig  . albuterol (PROVENTIL HFA;VENTOLIN HFA) 108 (90 Base) MCG/ACT inhaler Inhale 2 puffs into the lungs every 6 (six) hours as needed for wheezing or shortness of breath.  Marland Kitchen BAYER CONTOUR NEXT TEST test strip Check blood sugar daily  . clobetasol ointment (TEMOVATE) 2.67 % Apply 1 application topically 2 (two) times daily.  . Fluocinolone Acetonide Body 0.01 % OIL Apply 1 application topically 2 (two) times daily as needed.  . lovastatin (MEVACOR) 40 MG tablet TAKE 1 TABLET BY MOUTH ONCE DAILY AT  6PM.  . oxybutynin (DITROPAN) 5 MG tablet Take 1 tablet (5 mg total) by mouth 2 (two) times daily.  . Semaglutide (OZEMPIC) 0.25 or 0.5 MG/DOSE SOPN Inject 0.25 mg into the skin every Sunday.  Marland Kitchen SYNJARDY 12.12-998 MG TABS Take 1 tablet 2 (two) times daily by mouth.   . vitamin B-12 (CYANOCOBALAMIN) 1000 MCG tablet Take 1,000 mcg by mouth every other day.  . citalopram (CELEXA) 20 MG tablet 1/2 tablet daily for one week, then increase to one full tablet  . [DISCONTINUED] fenofibrate 160 MG tablet Take 1 tablet (160 mg total) by mouth daily.   Facility-Administered Encounter Medications as of 03/27/2019  Medication  . ipratropium-albuterol (DUONEB) 0.5-2.5 (3) MG/3ML nebulizer solution 3 mL    Allergies: Compazine and Bydureon [exenatide]  Body mass index is 21.59 kg/m.  Blood pressure 105/69, pulse (!) 57, temperature 98.8 F (37.1 C), temperature source Oral, height 5\' 3"  (1.6 m), weight 121 lb 14.4 oz (55.3 kg),  SpO2 100 %.  Review of Systems  Constitutional: Positive for fatigue. Negative for activity change, appetite change, chills, diaphoresis, fever and unexpected weight change.  Eyes: Negative for visual disturbance.  Respiratory: Negative for cough, chest tightness, shortness of breath, wheezing and stridor.   Cardiovascular: Negative for chest pain, palpitations and leg swelling.  Gastrointestinal: Positive for diarrhea. Negative for abdominal distention, abdominal pain, anal bleeding, blood in stool, constipation, nausea, rectal pain and vomiting.  Hematological: Negative for adenopathy. Does not bruise/bleed easily.  Psychiatric/Behavioral: Positive for dysphoric mood. Negative for agitation, behavioral problems, confusion, decreased concentration, hallucinations, self-injury, sleep disturbance and suicidal ideas. The patient is not nervous/anxious and is not hyperactive.        Objective:   Physical Exam Vitals signs and nursing note reviewed.  Constitutional:      General: She is not in acute distress.    Appearance: Normal appearance. She is normal weight. She is not ill-appearing, toxic-appearing or diaphoretic.  HENT:     Head: Normocephalic and atraumatic.  Cardiovascular:     Rate and Rhythm: Normal rate.     Pulses: Normal pulses.     Heart sounds: Normal heart sounds. No murmur. No friction rub. No gallop.   Pulmonary:     Effort: Pulmonary effort is normal. No respiratory distress.     Breath sounds: Normal breath sounds. No stridor. No wheezing, rhonchi or rales.  Chest:     Chest wall: No tenderness.  Skin:    General: Skin is warm and dry.  Neurological:     Mental Status: She is alert and oriented to person, place, and time.  Psychiatric:        Attention and Perception: Attention and perception normal.        Mood and Affect: Mood normal. Mood is not anxious or depressed. Affect is tearful.        Speech: Speech normal.        Behavior: Behavior normal.         Thought Content: Thought content normal.        Cognition and Memory: Cognition and memory normal.        Judgment: Judgment normal.     Comments: Very well groomed         Assessment & Plan:   1. Fatigue, unspecified type   2. Depression, unspecified depression type   3. B12 deficiency     Depression We will call you when lab results are available. Re-start citalopram (Celexa) 20mg - 1/2 tablet daily for one week then increase to one full tablet. Remain well hydrated, follow BRAT diet. If your develop abdominal pain, vomiting, or blood in stool- please call clinic. Please follow-up in 3 weeks, via TeleMedicine. Rest often. Continue to social distance and wear a mask when in public.  B12 deficiency Recently started on oral B12 by Dr. Lindwood Qua   Fatigue TSH, CBC drawn     FOLLOW-UP:  Return in about 3 weeks (around 04/17/2019) for Evaluate Medication Effectiveness, Depression.

## 2019-03-27 ENCOUNTER — Telehealth: Payer: Self-pay | Admitting: Adult Health

## 2019-03-27 ENCOUNTER — Other Ambulatory Visit: Payer: Self-pay

## 2019-03-27 ENCOUNTER — Ambulatory Visit (INDEPENDENT_AMBULATORY_CARE_PROVIDER_SITE_OTHER): Payer: Managed Care, Other (non HMO) | Admitting: Adult Health

## 2019-03-27 ENCOUNTER — Encounter: Payer: Self-pay | Admitting: Adult Health

## 2019-03-27 VITALS — BP 105/69 | HR 57 | Temp 98.8°F | Ht 63.0 in | Wt 121.9 lb

## 2019-03-27 DIAGNOSIS — R5383 Other fatigue: Secondary | ICD-10-CM | POA: Diagnosis not present

## 2019-03-27 DIAGNOSIS — F329 Major depressive disorder, single episode, unspecified: Secondary | ICD-10-CM

## 2019-03-27 DIAGNOSIS — E538 Deficiency of other specified B group vitamins: Secondary | ICD-10-CM | POA: Diagnosis not present

## 2019-03-27 DIAGNOSIS — F32A Depression, unspecified: Secondary | ICD-10-CM

## 2019-03-27 MED ORDER — CITALOPRAM HYDROBROMIDE 20 MG PO TABS: ORAL_TABLET | ORAL | 0 refills | Status: DC

## 2019-03-27 NOTE — Assessment & Plan Note (Signed)
TSH, CBC drawn

## 2019-03-27 NOTE — Telephone Encounter (Signed)
Advised pt that her spouse cannot come into the office with her.  Pt expressed understanding.  Charyl Bigger, CMA

## 2019-03-27 NOTE — Telephone Encounter (Signed)
Patient wants husband to come into Exam room with her @ today's appt--already advised them no, -- forwarding note to Medical assistant to call them with clarification if he can or Not come into room with her.  --glh

## 2019-03-27 NOTE — Patient Instructions (Addendum)
Persistent Depressive Disorder  Persistent depressive disorder (PDD) is a mental health condition. PDD causes symptoms of low-level depression for 2 years or longer. It may also be called long-term (chronic) depression or dysthymia. PDD may include episodes of more severe depression that last for about 2 weeks (major depressive disorder or MDD). PDD can affect the way you think, feel, and sleep. This condition may also affect your relationships. You may be more likely to get sick if you have PDD. Symptoms of PDD occur for most of the day and may include:  Feeling tired (fatigue).  Low energy.  Eating too much or too little.  Sleeping too much or too little.  Feeling restless or agitated.  Feeling hopeless.  Feeling worthless or guilty.  Feeling worried or nervous (anxiety).  Trouble concentrating or making decisions.  Low self-esteem.  A negative way of looking at things ([place]).  Not being able to have fun or feel pleasure.  Avoiding interacting with people.  Getting angry or annoyed easily (irritability).  Acting aggressive or angry. Follow these instructions at home: Activity  Go back to your normal activities as told by your doctor.  Exercise regularly as told by your doctor. General instructions  Take over-the-counter and prescription medicines only as told by your doctor.  Do not drink alcohol. Or, limit how much alcohol you drink to no more than 1 drink a day for nonpregnant women and 2 drinks a day for men. One drink equals 12 oz of beer, 5 oz of wine, or 1 oz of hard liquor. Alcohol can affect any antidepressant medicines you are taking. Talk with your doctor about your alcohol use.  Eat a healthy diet and get plenty of sleep.  Find activities that you enjoy each day.  Consider joining a support group. Your doctor may be able to suggest a support group.  Keep all follow-up visits as told by your doctor. This is important. Where to find more  information [employer] on Mental Illness  www.nami.org U.[place]. National Institute of Mental Health  https://carter.com/ National Suicide Prevention Lifeline  906-469-6984).  This is free, 24-hour help. Contact a doctor if:  Your symptoms get worse.  You have new symptoms.  You have trouble sleeping or doing your daily activities. Get help right away if:  You self-harm.  You have serious thoughts about hurting yourself or others.  You see, hear, taste, smell, or feel things that are not there (hallucinate). This information is not intended to replace advice given to you by your health care provider. Make sure you discuss any questions you have with your health care provider. Document Released: 07/15/2015 Document Revised: 07/16/2017 Document Reviewed: 03/27/2016 Elsevier Patient Education  2020 [employer].   We will call you when lab results are available. Re-start citalopram (Celexa) 20mg - 1.2 tablet daily for one week then increase to one full tablet. Remain well hydrated, follow BRAT diet. If your develop abdominal pain, vomiting, or blood in stool- please call clinic. Please follow-up in 3 weeks, via TeleMedicine. Rest often. Continue to social distance and wear a mask when in public. FEEL BETTER!

## 2019-03-27 NOTE — Assessment & Plan Note (Signed)
Recently started on oral B12 by Dr. Lindwood Qua

## 2019-03-27 NOTE — Assessment & Plan Note (Signed)
We will call you when lab results are available. Re-start citalopram (Celexa) 20mg - 1/2 tablet daily for one week then increase to one full tablet. Remain well hydrated, follow BRAT diet. If your develop abdominal pain, vomiting, or blood in stool- please call clinic. Please follow-up in 3 weeks, via TeleMedicine. Rest often. Continue to social distance and wear a mask when in public.

## 2019-03-28 ENCOUNTER — Ambulatory Visit: Payer: Managed Care, Other (non HMO) | Admitting: Adult Health

## 2019-03-28 LAB — CBC
Hematocrit: 42 % (ref 34.0–46.6)
Hemoglobin: 13.7 g/dL (ref 11.1–15.9)
MCH: 29.1 pg (ref 26.6–33.0)
MCHC: 32.6 g/dL (ref 31.5–35.7)
MCV: 89 fL (ref 79–97)
Platelets: 300 10*3/uL (ref 150–450)
RBC: 4.71 x10E6/uL (ref 3.77–5.28)
RDW: 12.8 % (ref 11.7–15.4)
WBC: 6.3 10*3/uL (ref 3.4–10.8)

## 2019-03-28 LAB — TSH: TSH: 1.29 u[IU]/mL (ref 0.450–4.500)

## 2019-04-10 ENCOUNTER — Telehealth: Payer: Self-pay | Admitting: Adult Health

## 2019-04-10 NOTE — Telephone Encounter (Signed)
Patient called state her throat is feeling sore, her daughter ws Dx over the weekend w/ strep & now pt afraid she has contracted it---Forwarding message to medical assistant to call pt w/ advice on whether Telehealth needed or Rx can be called in.  --glh

## 2019-04-10 NOTE — Telephone Encounter (Signed)
Pt advised that she will need to be seen at [facility] for testing since we are not currently seeing acutely ill patients in the office.  Pt expressed understanding.  Charyl Bigger, CMA

## 2019-04-12 ENCOUNTER — Telehealth: Payer: Self-pay | Admitting: Adult Health

## 2019-04-12 MED ORDER — CITALOPRAM HYDROBROMIDE 20 MG PO TABS: 20.0000 mg | ORAL_TABLET | Freq: Every day | ORAL | 0 refills | Status: DC

## 2019-04-12 NOTE — Telephone Encounter (Signed)
Patient called states she wants to be restarted on this Rx :   citalopram (CELEXA) 20 MG tablet [id number]   Order Details Dose, Route, Frequency: As Directed  Indications of Use: Depression  Dispense Quantity: 90 tablet Refills: 0 Fills remaining: --        Sig: 1/2 tablet daily for one week, then increase to one full tablet     --Forwarding message to medical assistant for review w/provider & to send refill order if approved  to pharmacy :   [facility] [address] ([address] [place] [phone] (Phone) [phone] (Fax)   --Dion Body

## 2019-04-12 NOTE — Telephone Encounter (Signed)
Previous RX for this medication was marked "No print" and was not sent to pharmacy.  Resent RX to pharmacy.  Charyl Bigger, CMA

## 2019-04-17 ENCOUNTER — Ambulatory Visit: Payer: Managed Care, Other (non HMO) | Admitting: Adult Health

## 2019-05-04 ENCOUNTER — Other Ambulatory Visit: Payer: Self-pay | Admitting: Adult Health

## 2019-06-21 LAB — NOVEL CORONAVIRUS, NAA: SARS-CoV-2, NAA: NEGATIVE

## 2019-07-03 ENCOUNTER — Telehealth: Payer: Self-pay | Admitting: Adult Health

## 2019-07-03 NOTE — Telephone Encounter (Signed)
Received documentation that COVID test was negative.  Per Mina Marble, NP ok to schedule for in office visit.  Advised Annabell Sabal to call pt back and schedule OV.  Charyl Bigger, CMA

## 2019-07-03 NOTE — Progress Notes (Signed)
Subjective:    Patient ID: [facility], female    DOB: [birth year], 63 y.o.   MRN: [id number]  HPI:  Ms. Host presents with productive cough (thick yellow mucus), post nasal gtt, chest tightness/wheezing that began >1 week and has been steadily worsening. She reports cough is especially bothersome at night. She denies fever/night sweats/chills/extreme fatigue. She denies N/V/D. She denies loss of sense of smell or taste.  She denies sore throat. She denies HA. She has not been using any OTC treatments, she is out of her Albuterol. She believes that she gets "bronchitis about this time every year".  SARS-CoV-2 Negative-06/21/2019 - performed from an outside provider- results have been scanned into system.  She was exposed to [facility] Virus + individual at an outdoor gathering the weekend of Halloween  Strep + 04/10/2019- treated with Amoxicillin 500mg  BID x 10 days   Patient Care Team    Relationship Specialty Notifications Start End  Mina Marble D, NP PCP - General Family Medicine  06/01/17   Teena Irani, MD (Inactive) Consulting Physician Gastroenterology  06/02/16   Delrae Rend, MD Consulting Physician Endocrinology  06/02/16   Druscilla Brownie, MD Consulting Physician Dermatology  06/02/16   Nicholas Lose, MD Consulting Physician Hematology and Oncology  06/02/16   Bjorn Loser, MD Consulting Physician Urology  06/02/16   Christain Sacramento, [place] Referring Physician Optometry  05/11/17     Patient Active Problem List   Diagnosis Date Noted  . Bronchitis 07/04/2019  . Fatigue 03/27/2019  . Flu-like symptoms 09/26/2018  . Wheezing 09/26/2018  . Cough in adult 06/21/2018  . Vaginal yeast infection 03/28/2018  . Abnormal nuclear stress test   . Chest pain 12/07/2017  . Healthcare maintenance 11/11/2017  . Screening for cervical cancer 11/11/2017  . Personal history UTI- 04/07/17, + Cx- txed with abx 05/18/2017  . Urinary frequency 05/18/2017  . Diarrhea in  adult patient 05/11/2017  . UTI (urinary tract infection) 04/07/2017  . Dysuria 04/07/2017  . Abnormal urinalysis 04/07/2017  . Cervical smear, as part of routine gynecological examination 11/09/2016  . Pneumonia 09/23/2016  . Depression 05/28/2016  . B12 deficiency 05/28/2016  . SUI (stress urinary incontinence, female) 05/28/2016  . Hypertriglyceridemia 05/28/2016  . Vaginal atrophy 03/25/2016  . h/o Anxiety 03/08/2016  . Hyperlipidemia 03/04/2016  . Vitamin D insufficiency 03/04/2016  . History of breast cancer 03/04/2016  . Contact dermatitis and eczema due to cause 03/04/2016  . Closed fracture of proximal phalanx of toe 04/03/2014  . Diabetes ([place]) 03/27/2014  . Neoplasm of left breast, primary tumor staging category Tis: ductal carcinoma in situ (DCIS) 02/10/2011     Past Medical History:  Diagnosis Date  . Anxiety   . Diabetes mellitus   . History of breast cancer ONCOLOGIST-- DR Humphrey Rolls--  NO RECURRENCE   S/P LEFT PARTIAL MASTECTOMY W/ SLN BX'S  12-02-2010---  DUCTAL CARCINOMA IN SITU--  S/P RADIATION THERAPY ENDED 02-17-2011  . Hyperlipidemia   . Personal history of radiation therapy 2012  . Pneumonia   . PONV (postoperative nausea and vomiting)   . SUI (stress urinary incontinence, female)   . Vitamin D deficiency   . Vitamin D insufficiency 03/04/2016     Past Surgical History:  Procedure Laterality Date  . BLADDER SUSPENSION N/A 02/03/2013   Procedure: [facility] PROCEDURE;  Surgeon: Reece Packer, MD;  Location: [facility];  Service: Urology;  Laterality: N/A;  . BREAST LUMPECTOMY Left 2012  . BUNIONECTOMY    .  CESAREAN SECTION     X4  . LEFT HEART CATH AND CORONARY ANGIOGRAPHY N/A 01/26/2018   Procedure: LEFT HEART CATH AND CORONARY ANGIOGRAPHY;  Surgeon: Wellington Hampshire, MD;  Location: [place] [facility];  Service: Cardiovascular;  Laterality: N/A;  . MENISCUS REPAIR Left   . PARTIAL MASTECTOMY WITH AXILLARY SENTINEL LYMPH NODE BIOPSY Left  12-02-2010     Family History  Problem Relation Age of Onset  . Cancer Mother        lung  . Heart attack Father   . COPD Father   . Heart disease Father      Social History   Substance and Sexual Activity  Drug Use No     Social History   Substance and Sexual Activity  Alcohol Use No     Social History   Tobacco Use  Smoking Status Never Smoker  Smokeless Tobacco Never Used     Outpatient Encounter Medications as of 07/04/2019  Medication Sig  . albuterol (VENTOLIN HFA) 108 (90 Base) MCG/ACT inhaler Inhale 2 puffs into the lungs every 6 (six) hours as needed for wheezing or shortness of breath.  Marland Kitchen BAYER CONTOUR NEXT TEST test strip Check blood sugar daily  . citalopram (CELEXA) 20 MG tablet Take 1 tablet (20 mg total) by mouth daily.  . clobetasol ointment (TEMOVATE) [id number] % Apply 1 application topically 2 (two) times daily.  . Fluocinolone Acetonide Body 0.01 % OIL Apply 1 application topically 2 (two) times daily as needed.  . lovastatin (MEVACOR) 40 MG tablet TAKE 1 TABLET BY MOUTH ONCE DAILY AT  6PM.  . oxybutynin (DITROPAN) 5 MG tablet Take 1 tablet (5 mg total) by mouth 2 (two) times daily.  . Semaglutide (OZEMPIC) 0.25 or 0.5 MG/DOSE SOPN Inject 0.25 mg into the skin every Sunday.  Marland Kitchen SYNJARDY 12.12-998 MG TABS Take 1 tablet 2 (two) times daily by mouth.   . vitamin B-12 (CYANOCOBALAMIN) 1000 MCG tablet Take 1,000 mcg by mouth every other day.  . [DISCONTINUED] albuterol (PROVENTIL HFA;VENTOLIN HFA) 108 (90 Base) MCG/ACT inhaler Inhale 2 puffs into the lungs every 6 (six) hours as needed for wheezing or shortness of breath.  Marland Kitchen azithromycin (ZITHROMAX) 250 MG tablet 2 tabs day one. 1 tab days two-five.  . benzonatate (TESSALON) 200 MG capsule Take 1 capsule (200 mg total) by mouth 2 (two) times daily as needed for cough.   Facility-Administered Encounter Medications as of 07/04/2019  Medication  . ipratropium-albuterol (DUONEB) 0.5-2.5 (3) MG/3ML nebulizer  solution 3 mL    Allergies: Compazine and Bydureon [exenatide]  Body mass index is 22.94 kg/m.  Blood pressure 113/63, pulse 66, temperature 98.7 F (37.1 C), temperature source Oral, height 5\' 3"  (1.6 m), weight 129 lb 8 oz (58.7 kg), SpO2 98 %.     Review of Systems  Constitutional: Negative for activity change, appetite change, chills, diaphoresis, fatigue, fever and unexpected weight change.  HENT: Positive for postnasal drip. Negative for congestion, facial swelling, hearing loss, sinus pressure and sore throat.   Eyes: Negative for visual disturbance.  Respiratory: Positive for cough, chest tightness and wheezing. Negative for shortness of breath.   Cardiovascular: Negative for chest pain, palpitations and leg swelling.  Gastrointestinal: Negative for abdominal distention, abdominal pain, anal bleeding, constipation, diarrhea, nausea and vomiting.  Endocrine: Negative for polydipsia, polyphagia and polyuria.  Genitourinary: Negative for difficulty urinating and flank pain.  Musculoskeletal: Negative for arthralgias, gait problem, joint swelling, myalgias, neck pain and neck stiffness.  Neurological: Negative  for dizziness and headaches.  Hematological: Negative for adenopathy. Does not bruise/bleed easily.  Psychiatric/Behavioral: Negative for agitation, behavioral problems, confusion, decreased concentration, dysphoric mood, hallucinations, self-injury, sleep disturbance and suicidal ideas. The patient is not nervous/anxious and is not hyperactive.        Objective:   Physical Exam Vitals signs and nursing note reviewed.  Constitutional:      General: She is not in acute distress.    Appearance: Normal appearance. She is normal weight. She is not ill-appearing, toxic-appearing or diaphoretic.  HENT:     Head: Normocephalic and atraumatic.     Right Ear: Tympanic membrane, ear canal and external ear normal. There is no impacted cerumen.     Left Ear: Tympanic membrane,  ear canal and external ear normal. There is no impacted cerumen.     Nose: Nose normal. No congestion.     Mouth/Throat:     Mouth: Mucous membranes are moist.     Pharynx: Posterior oropharyngeal erythema present. No oropharyngeal exudate.     Tonsils: No tonsillar exudate. 1+ on the right. 1+ on the left.     Comments: Clear drainage noted at back of throat  Eyes:     Extraocular Movements: Extraocular movements intact.     Conjunctiva/sclera: Conjunctivae normal.     Pupils: Pupils are equal, round, and reactive to light.  Neck:     Musculoskeletal: Normal range of motion and neck supple. No muscular tenderness.  Cardiovascular:     Rate and Rhythm: Normal rate and regular rhythm.     Pulses: Normal pulses.     Heart sounds: Normal heart sounds. No murmur. No friction rub. No gallop.   Pulmonary:     Effort: Pulmonary effort is normal. No respiratory distress.     Breath sounds: Normal breath sounds. No stridor. No wheezing, rhonchi or rales.  Chest:     Chest wall: No tenderness.  Skin:    Capillary Refill: Capillary refill takes less than 2 seconds.  Neurological:     Mental Status: She is alert and oriented to person, place, and time.  Psychiatric:        Mood and Affect: Mood normal.        Behavior: Behavior normal.        Thought Content: Thought content normal.        Judgment: Judgment normal.        Assessment & Plan:   1. Bronchitis     Bronchitis Remain well hydrated, rest often. Please take Azithromycin as directed, Albuterol and Tessalon as needed. If symptoms worsens/persist after Azithromycin completed- please call clinic. Continue to social distance and wear a mask when in clinic.    FOLLOW-UP:  Return if symptoms worsen or fail to improve.

## 2019-07-03 NOTE — Telephone Encounter (Signed)
Patient called, stating that she hasn't been feeling the best and is having some wheezing in her chest along with coughing, mainly at night when she lays down. Please Advise.

## 2019-07-03 NOTE — Telephone Encounter (Signed)
Pt denies SOB, fevers, nasal drainage, ear pain, and sore throat.  Pt states that otherwise, she feels fine.  Pt states [facility].  Advised pt that she will need to have a copy of the results faxed to our office before we can schedule an OV.  Pt expressed understanding and is agreeable.  She will contact the church to have a copy faxed to our office.  Fax number provided to pt.  Charyl Bigger, CMA

## 2019-07-03 NOTE — Telephone Encounter (Signed)
Appointment scheduled for Tuesday 07/04/2019.

## 2019-07-04 ENCOUNTER — Encounter: Payer: Self-pay | Admitting: Adult Health

## 2019-07-04 ENCOUNTER — Ambulatory Visit (INDEPENDENT_AMBULATORY_CARE_PROVIDER_SITE_OTHER): Payer: Managed Care, Other (non HMO) | Admitting: Adult Health

## 2019-07-04 ENCOUNTER — Other Ambulatory Visit: Payer: Self-pay

## 2019-07-04 DIAGNOSIS — J40 Bronchitis, not specified as acute or chronic: Secondary | ICD-10-CM | POA: Insufficient documentation

## 2019-07-04 MED ORDER — AZITHROMYCIN 250 MG PO TABS: ORAL_TABLET | ORAL | 0 refills | Status: DC

## 2019-07-04 MED ORDER — ALBUTEROL SULFATE HFA 108 (90 BASE) MCG/ACT IN AERS: 2.0000 | INHALATION_SPRAY | Freq: Four times a day (QID) | RESPIRATORY_TRACT | 1 refills | Status: DC | PRN

## 2019-07-04 MED ORDER — BENZONATATE 200 MG PO CAPS: 200.0000 mg | ORAL_CAPSULE | Freq: Two times a day (BID) | ORAL | 0 refills | Status: DC | PRN

## 2019-07-04 NOTE — Assessment & Plan Note (Signed)
Remain well hydrated, rest often. Please take Azithromycin as directed, Albuterol and Tessalon as needed. If symptoms worsens/persist after Azithromycin completed- please call clinic. Continue to social distance and wear a mask when in clinic.

## 2019-07-04 NOTE — Patient Instructions (Addendum)
Acute Bronchitis, Adult  Acute bronchitis is sudden (acute) swelling of the air tubes (bronchi) in the lungs. Acute bronchitis causes these tubes to fill with mucus, which can make it hard to breathe. It can also cause coughing or wheezing. In adults, acute bronchitis usually goes away within 2 weeks. A cough caused by bronchitis may last up to 3 weeks. Smoking, allergies, and asthma can make the condition worse. Repeated episodes of bronchitis may cause further lung problems, such as chronic obstructive pulmonary disease (COPD). What are the causes? This condition can be caused by germs and by substances that irritate the lungs, including:  Cold and flu viruses. This condition is most often caused by the same virus that causes a cold.  Bacteria.  Exposure to tobacco smoke, dust, fumes, and air pollution. What increases the risk? This condition is more likely to develop in people who:  Have close contact with someone with acute bronchitis.  Are exposed to lung irritants, such as tobacco smoke, dust, fumes, and vapors.  Have a weak immune system.  Have a respiratory condition such as asthma. What are the signs or symptoms? Symptoms of this condition include:  A cough.  Coughing up clear, yellow, or green mucus.  Wheezing.  Chest congestion.  Shortness of breath.  A fever.  Body aches.  Chills.  A sore throat. How is this diagnosed? This condition is usually diagnosed with a physical exam. During the exam, your health care provider may order tests, such as chest X-rays, to rule out other conditions. He or she may also:  Test a sample of your mucus for bacterial infection.  Check the level of oxygen in your blood. This is done to check for pneumonia.  Do a chest X-ray or lung function testing to rule out pneumonia and other conditions.  Perform blood tests. Your health care provider will also ask about your symptoms and medical history. How is this treated? Most  cases of acute bronchitis clear up over time without treatment. Your health care provider may recommend:  Drinking more fluids. Drinking more makes your mucus thinner, which may make it easier to breathe.  Taking a medicine for a fever or cough.  Taking an antibiotic medicine.  Using an inhaler to help improve shortness of breath and to control a cough.  Using a cool mist vaporizer or humidifier to make it easier to breathe. Follow these instructions at home: Medicines  Take over-the-counter and prescription medicines only as told by your health care provider.  If you were prescribed an antibiotic, take it as told by your health care provider. Do not stop taking the antibiotic even if you start to feel better. General instructions   Get plenty of rest.  Drink enough fluids to keep your urine pale yellow.  Avoid smoking and secondhand smoke. Exposure to cigarette smoke or irritating chemicals will make bronchitis worse. If you smoke and you need help quitting, ask your health care provider. Quitting smoking will help your lungs heal faster.  Use an inhaler, cool mist vaporizer, or humidifier as told by your health care provider.  Keep all follow-up visits as told by your health care provider. This is important. How is this prevented? To lower your risk of getting this condition again:  Wash your hands often with soap and water. If soap and water are not available, use hand sanitizer.  Avoid contact with people who have cold symptoms.  Try not to touch your hands to your mouth, nose, or eyes.    Make sure to get the flu shot every year. Contact a health care provider if:  Your symptoms do not improve in 2 weeks of treatment. Get help right away if:  You cough up blood.  You have chest pain.  You have severe shortness of breath.  You become dehydrated.  You faint or keep feeling like you are going to faint.  You keep vomiting.  You have a severe headache.  Your  fever or chills gets worse. This information is not intended to replace advice given to you by your health care provider. Make sure you discuss any questions you have with your health care provider. Document Released: 09/10/2004 Document Revised: 06/16/2018 Document Reviewed: 01/22/2016 Elsevier Patient Education  2020 [place] well hydrated, rest often. Please take Azithromycin as directed, Albuterol and Tessalon as needed. If symptoms worsens/persist after Azithromycin completed- please call clinic. Continue to social distance and wear a mask when in clinic. FEEL BETTER!

## 2019-07-10 ENCOUNTER — Telehealth: Payer: Self-pay | Admitting: Adult Health

## 2019-07-10 ENCOUNTER — Other Ambulatory Visit: Payer: Self-pay | Admitting: Adult Health

## 2019-07-10 MED ORDER — DOXYCYCLINE HYCLATE 100 MG PO TABS: 100.0000 mg | ORAL_TABLET | Freq: Two times a day (BID) | ORAL | 0 refills | Status: DC

## 2019-07-10 NOTE — Telephone Encounter (Signed)
Patient called states Zpac isn't working & wishes provider to prescribe her doxycycline instead ( she recalls was given that med @ a [facility] for same condition & it worked well for her).  ---Forwarding request to med asst that if provider approved pls send to :   [place], [place] - [place] [phone] (Phone) [phone] (Fax)   --glh

## 2019-07-10 NOTE — Telephone Encounter (Signed)
Good Evening Baker Janus, Can you please call Ms. Mcclimans and share- Doxycyline sent in. Thanks! Valetta Fuller

## 2019-07-19 ENCOUNTER — Ambulatory Visit (INDEPENDENT_AMBULATORY_CARE_PROVIDER_SITE_OTHER): Payer: Managed Care, Other (non HMO) | Admitting: Adult Health

## 2019-07-19 ENCOUNTER — Encounter: Payer: Self-pay | Admitting: Adult Health

## 2019-07-19 ENCOUNTER — Other Ambulatory Visit: Payer: Managed Care, Other (non HMO)

## 2019-07-19 ENCOUNTER — Other Ambulatory Visit: Payer: Self-pay

## 2019-07-19 DIAGNOSIS — E781 Pure hyperglyceridemia: Secondary | ICD-10-CM

## 2019-07-19 DIAGNOSIS — E785 Hyperlipidemia, unspecified: Secondary | ICD-10-CM

## 2019-07-19 DIAGNOSIS — Z79899 Other long term (current) drug therapy: Secondary | ICD-10-CM

## 2019-07-19 DIAGNOSIS — E0800 Diabetes mellitus due to underlying condition with hyperosmolarity without nonketotic hyperglycemic-hyperosmolar coma (NKHHC): Secondary | ICD-10-CM

## 2019-07-19 DIAGNOSIS — F329 Major depressive disorder, single episode, unspecified: Secondary | ICD-10-CM | POA: Diagnosis not present

## 2019-07-19 DIAGNOSIS — Z0000 Encounter for general adult medical examination without abnormal findings: Secondary | ICD-10-CM | POA: Diagnosis not present

## 2019-07-19 DIAGNOSIS — F32A Depression, unspecified: Secondary | ICD-10-CM

## 2019-07-19 DIAGNOSIS — R5383 Other fatigue: Secondary | ICD-10-CM

## 2019-07-19 MED ORDER — CITALOPRAM HYDROBROMIDE 20 MG PO TABS: 20.0000 mg | ORAL_TABLET | Freq: Every day | ORAL | 0 refills | Status: DC

## 2019-07-19 MED ORDER — LOVASTATIN 40 MG PO TABS: ORAL_TABLET | ORAL | 1 refills | Status: DC

## 2019-07-19 MED ORDER — NYSTATIN-TRIAMCINOLONE 100000-0.1 UNIT/GM-% EX OINT: 1.0000 "application " | TOPICAL_OINTMENT | Freq: Two times a day (BID) | CUTANEOUS | 0 refills | Status: DC | PRN

## 2019-07-19 MED ORDER — OXYBUTYNIN CHLORIDE 5 MG PO TABS: 5.0000 mg | ORAL_TABLET | Freq: Two times a day (BID) | ORAL | 0 refills | Status: DC

## 2019-07-19 NOTE — Assessment & Plan Note (Signed)
Lab Results  Component Value Date   HGBA1C 7.3 03/15/2019   HGBA1C 7.2 (H) 11/11/2018   HGBA1C 6.4 (H) 11/11/2017  A1c drawn today She reports AM BG 120-130s, denies episodes of hypoglycemia. She is currently taking Synjardy 12.5mg /1000mg  BIG, Semaglutide 0.25 once weekly. Next f/u with Dr. Rexene Agent 2021

## 2019-07-19 NOTE — Progress Notes (Signed)
Virtual Visit via Telephone Note  I connected with Nazli Birtcher on 07/19/19 at 10:30 AM EST by telephone and verified that I am speaking with the correct person using two identifiers.  Location: Patient: Home Provider: In Clinic   I discussed the limitations, risks, security and privacy concerns of performing an evaluation and management service by telephone and the availability of in person appointments. I also discussed with the patient that there may be a patient responsible charge related to this service. The patient expressed understanding and agreed to proceed.   History of Present Illness: Ms. Rens presents for CPE. She reports medication compliance, denies SE She reports AM BG 110-120s, denies episodes of hypoglycemia She is followed by Dr. Kerr/Endocrinology annually, has OV next month- they will fax over her A1c Currently on Semaglutide 0.25mg  once weekly and Synjardy 12.5/1000mg  BID She estimates to drink 40-50oz water/day, follows diabetic diet and leads a quite active life.  Recent Labs       Lab Results  Component Value Date   HGBA1C 7.2 (H) 11/11/2018   HGBA1C 6.4 (H) 11/11/2017   HGBA1C 6.6 11/03/2017   HGBA1C 6.6 11/03/2017     She has been weaning off the Citalopram as directed, denies any withdrawal sx's  Labs from 11/11/2018 CBC-WNL CMP-WNL THS-WNL, 1.870 B12- WNL, 302 (normal 232-1,245) Lipid Panel- The 10-year ASCVD risk score Mikey Bussing DC Jr., et al., 2013) is: 4.2% Values used to calculate the score:  Age: 63 years  Sex: Female  Is Non-Hispanic African American: No  Diabetic: Yes  Tobacco smoker: No  Systolic Blood Pressure: [id number] mmHg  Is BP treated: No  HDL Cholesterol: 53 mg/dL  Total Cholesterol: 158 mg/dL LDL-92 A1c-7.2, bump from 6.4 last yea  Healthcare Maintenance: PAP-Updated today Mammogram-schedule for next week Colonoscopy-UTD, last 2015 LDCT-N/A  07/19/2019 OV: Ms. Ledet calls  in today for regular f/u: T2D, Depression, HLD She reports AM BG 120-130s, denies episodes of hypoglycemia. She is currently taking Synjardy 12.5mg /1000mg  BIG, Semaglutide 0.25 once weekly. Next f/u with Dr. Rexene Agent 2021 She denies acute cardiac sx's She reports that the Azithyromycin did not clear cough- course of Doxycycline did stop cough. She reports mood is stable, denies SI/HI She feels "so much better" since resuming Citalopram 20mg  QD. She remains active with daily walking.  Patient Care Team    Relationship Specialty Notifications Start End  Mina Marble D, NP PCP - General Family Medicine  06/01/17   Teena Irani, MD (Inactive) Consulting Physician Gastroenterology  06/02/16   Delrae Rend, MD Consulting Physician Endocrinology  06/02/16   Druscilla Brownie, MD Consulting Physician Dermatology  06/02/16   Nicholas Lose, MD Consulting Physician Hematology and Oncology  06/02/16   Bjorn Loser, MD Consulting Physician Urology  06/02/16   Christain Sacramento, [place] Referring Physician Optometry  05/11/17     Patient Active Problem List   Diagnosis Date Noted  . Bronchitis 07/04/2019  . Fatigue 03/27/2019  . Flu-like symptoms 09/26/2018  . Wheezing 09/26/2018  . Cough in adult 06/21/2018  . Vaginal yeast infection 03/28/2018  . Abnormal nuclear stress test   . Chest pain 12/07/2017  . Healthcare maintenance 11/11/2017  . Screening for cervical cancer 11/11/2017  . Personal history UTI- 04/07/17, + Cx- txed with abx 05/18/2017  . Urinary frequency 05/18/2017  . Diarrhea in adult patient 05/11/2017  . UTI (urinary tract infection) 04/07/2017  . Dysuria 04/07/2017  . Abnormal urinalysis 04/07/2017  . Cervical smear, as part of routine gynecological examination 11/09/2016  .  Pneumonia 09/23/2016  . Depression 05/28/2016  . B12 deficiency 05/28/2016  . SUI (stress urinary incontinence, female) 05/28/2016  . Hypertriglyceridemia 05/28/2016  . Vaginal atrophy 03/25/2016  .  h/o Anxiety 03/08/2016  . Hyperlipidemia 03/04/2016  . Vitamin D insufficiency 03/04/2016  . History of breast cancer 03/04/2016  . Contact dermatitis and eczema due to cause 03/04/2016  . Closed fracture of proximal phalanx of toe 04/03/2014  . Diabetes ([place]) 03/27/2014  . Neoplasm of left breast, primary tumor staging category Tis: ductal carcinoma in situ (DCIS) 02/10/2011     Past Medical History:  Diagnosis Date  . Anxiety   . Diabetes mellitus   . History of breast cancer ONCOLOGIST-- DR Humphrey Rolls--  NO RECURRENCE   S/P LEFT PARTIAL MASTECTOMY W/ SLN BX'S  12-02-2010---  DUCTAL CARCINOMA IN SITU--  S/P RADIATION THERAPY ENDED 02-17-2011  . Hyperlipidemia   . Personal history of radiation therapy 2012  . Pneumonia   . PONV (postoperative nausea and vomiting)   . SUI (stress urinary incontinence, female)   . Vitamin D deficiency   . Vitamin D insufficiency 03/04/2016     Past Surgical History:  Procedure Laterality Date  . BLADDER SUSPENSION N/A 02/03/2013   Procedure: [facility] PROCEDURE;  Surgeon: Reece Packer, MD;  Location: [facility];  Service: Urology;  Laterality: N/A;  . BREAST LUMPECTOMY Left 2012  . BUNIONECTOMY    . CESAREAN SECTION     X4  . LEFT HEART CATH AND CORONARY ANGIOGRAPHY N/A 01/26/2018   Procedure: LEFT HEART CATH AND CORONARY ANGIOGRAPHY;  Surgeon: Wellington Hampshire, MD;  Location: [facility];  Service: Cardiovascular;  Laterality: N/A;  . MENISCUS REPAIR Left   . PARTIAL MASTECTOMY WITH AXILLARY SENTINEL LYMPH NODE BIOPSY Left 12-02-2010     Family History  Problem Relation Age of Onset  . Cancer Mother        lung  . Heart attack Father   . COPD Father   . Heart disease Father      Social History   Substance and Sexual Activity  Drug Use No     Social History   Substance and Sexual Activity  Alcohol Use No     Social History   Tobacco Use  Smoking Status Never Smoker  Smokeless Tobacco Never Used      Outpatient Encounter Medications as of 07/19/2019  Medication Sig  . albuterol (VENTOLIN HFA) 108 (90 Base) MCG/ACT inhaler Inhale 2 puffs into the lungs every 6 (six) hours as needed for wheezing or shortness of breath.  Marland Kitchen BAYER CONTOUR NEXT TEST test strip Check blood sugar daily  . citalopram (CELEXA) 20 MG tablet Take 1 tablet (20 mg total) by mouth daily.  . clobetasol ointment (TEMOVATE) [id number] % Apply 1 application topically 2 (two) times daily.  . Fluocinolone Acetonide Body 0.01 % OIL Apply 1 application topically 2 (two) times daily as needed.  . lovastatin (MEVACOR) 40 MG tablet TAKE 1 TABLET BY MOUTH ONCE DAILY AT  6PM.  . nystatin-triamcinolone ointment (MYCOLOG) Apply 1 application topically 2 (two) times daily as needed.  Marland Kitchen oxybutynin (DITROPAN) 5 MG tablet Take 1 tablet (5 mg total) by mouth 2 (two) times daily.  . Semaglutide (OZEMPIC) 0.25 or 0.5 MG/DOSE SOPN Inject 0.25 mg into the skin every Sunday.  Marland Kitchen SYNJARDY 12.12-998 MG TABS Take 1 tablet 2 (two) times daily by mouth.   . vitamin B-12 (CYANOCOBALAMIN) 1000 MCG tablet Take 1,000 mcg by mouth every other  day.  . [DISCONTINUED] citalopram (CELEXA) 20 MG tablet Take 1 tablet (20 mg total) by mouth daily.  . [DISCONTINUED] doxycycline (VIBRA-TABS) 100 MG tablet Take 1 tablet (100 mg total) by mouth 2 (two) times daily.  . [DISCONTINUED] lovastatin (MEVACOR) 40 MG tablet TAKE 1 TABLET BY MOUTH ONCE DAILY AT  6PM.  . [DISCONTINUED] nystatin-triamcinolone ointment (MYCOLOG) Apply 1 application topically 2 (two) times daily as needed.  . [DISCONTINUED] oxybutynin (DITROPAN) 5 MG tablet Take 1 tablet (5 mg total) by mouth 2 (two) times daily.  . [DISCONTINUED] azithromycin (ZITHROMAX) 250 MG tablet 2 tabs day one. 1 tab days two-five.  . [DISCONTINUED] benzonatate (TESSALON) 200 MG capsule Take 1 capsule (200 mg total) by mouth 2 (two) times daily as needed for cough.   Facility-Administered Encounter Medications as of  07/19/2019  Medication  . ipratropium-albuterol (DUONEB) 0.5-2.5 (3) MG/3ML nebulizer solution 3 mL    Allergies: Compazine and Bydureon [exenatide]  Body mass index is 21.43 kg/m.  Blood pressure 118/74, pulse 63, height 5\' 3"  (1.6 m), weight 121 lb (54.9 kg). Review of Systems: General:   Denies fever, chills, unexplained weight loss.  Optho/Auditory:   Denies visual changes, blurred vision/LOV Respiratory:   Denies SOB, DOE more than baseline levels.  Cardiovascular:   Denies chest pain, palpitations, new onset peripheral edema  Gastrointestinal:   Denies nausea, vomiting, diarrhea.  Genitourinary: Denies dysuria, freq/ urgency, flank pain or discharge from genitals.  Endocrine:     Denies hot or cold intolerance, polyuria, polydipsia. Musculoskeletal:   Denies unexplained myalgias, joint swelling, unexplained arthralgias, gait problems.  Skin:  Denies rash, suspicious lesions Neurological:     Denies dizziness, unexplained weakness, numbness  Psychiatric/Behavioral:   Denies mood changes, suicidal or homicidal ideations, hallucinations  Observations/Objective: No acute distress noted during the telephone conversation.  Assessment and Plan: Continue all medications as directed. Remain well hydrated, follow Diabetic diet. Continue to walk daily. Continue f/u with Dr. Lindwood Qua as directed. Will call with lab results Continue to social distance distance and wear a mask when in public.  Follow Up Instructions: 3 months OV 6 months CPE   I discussed the assessment and treatment plan with the patient. The patient was provided an opportunity to ask questions and all were answered. The patient agreed with the plan and demonstrated an understanding of the instructions.   The patient was advised to call back or seek an in-person evaluation if the symptoms worsen or if the condition fails to improve as anticipated.  I provided 15 minutes of non-face-to-face time during this  encounter.   Esaw Grandchild, NP

## 2019-07-19 NOTE — Assessment & Plan Note (Signed)
She reports mood is stable, denies SI/HI She feels "so much better" since resuming Citalopram 20mg  QD.

## 2019-07-19 NOTE — Assessment & Plan Note (Signed)
Assessment and Plan: Continue all medications as directed. Remain well hydrated, follow Diabetic diet. Continue to walk daily. Continue f/u with Dr. Lindwood Qua as directed. Will call with lab results Continue to social distance distance and wear a mask when in public.  Follow Up Instructions: 3 months OV 6 months CPE   I discussed the assessment and treatment plan with the patient. The patient was provided an opportunity to ask questions and all were answered. The patient agreed with the plan and demonstrated an understanding of the instructions.   The patient was advised to call back or seek an in-person evaluation if the symptoms worsen or if the condition fails to improve as anticipated.

## 2019-07-20 ENCOUNTER — Other Ambulatory Visit: Payer: Self-pay | Admitting: Adult Health

## 2019-07-20 LAB — LIPID PANEL
Chol/HDL Ratio: 3.7 ratio (ref 0.0–4.4)
Cholesterol, Total: 168 mg/dL (ref 100–199)
HDL: 45 mg/dL (ref >39)
LDL Chol Calc (NIH): 102 mg/dL — ABNORMAL HIGH (ref 0–99)
Triglycerides: 119 mg/dL (ref 0–149)
VLDL Cholesterol Cal: 21 mg/dL (ref 5–40)

## 2019-07-20 LAB — COMPREHENSIVE METABOLIC PANEL WITH GFR
ALT: 16 IU/L (ref 0–32)
AST: 17 IU/L (ref 0–40)
Albumin/Globulin Ratio: 1.3 (ref 1.2–2.2)
Albumin: 4 g/dL (ref 3.8–4.8)
Alkaline Phosphatase: 91 IU/L (ref 39–117)
BUN/Creatinine Ratio: 19 (ref 12–28)
BUN: 13 mg/dL (ref 8–27)
Bilirubin Total: 0.3 mg/dL (ref 0.0–1.2)
CO2: 24 mmol/L (ref 20–29)
Calcium: 9.6 mg/dL (ref 8.7–10.3)
Chloride: 103 mmol/L (ref 96–106)
Creatinine, Ser: 0.68 mg/dL (ref 0.57–1.00)
GFR calc Af Amer: 108 mL/min/1.73 (ref >59)
GFR calc non Af Amer: 94 mL/min/1.73 (ref >59)
Globulin, Total: 3 g/dL (ref 1.5–4.5)
Glucose: 111 mg/dL — ABNORMAL HIGH (ref 65–99)
Potassium: 4.3 mmol/L (ref 3.5–5.2)
Sodium: 139 mmol/L (ref 134–144)
Total Protein: 7 g/dL (ref 6.0–8.5)

## 2019-07-20 LAB — HEMOGLOBIN A1C
Est. average glucose Bld gHb Est-mCnc: 148 mg/dL
Hgb A1c MFr Bld: 6.8 % — ABNORMAL HIGH (ref 4.8–5.6)

## 2019-07-20 LAB — MAGNESIUM: Magnesium: 1.9 mg/dL (ref 1.6–2.3)

## 2019-07-26 ENCOUNTER — Other Ambulatory Visit: Payer: Self-pay

## 2019-07-26 MED ORDER — ATORVASTATIN CALCIUM 40 MG PO TABS: 40.0000 mg | ORAL_TABLET | Freq: Every day | ORAL | 0 refills | Status: DC

## 2019-08-03 ENCOUNTER — Telehealth: Payer: Self-pay | Admitting: Adult Health

## 2019-08-03 NOTE — Telephone Encounter (Signed)
Patient called into the office about the side effects she is having on Lipitor.  Patient is having severe joint pain.  She called the pharmacy and was told to contact her provider that prescribed the medication.  Patient does not want to continue on this medication due to the side effects.  What are her next steps?  Please call her at [phone].

## 2019-08-03 NOTE — Telephone Encounter (Signed)
Pt states that she would prefer to go back on the lovastatin since she tolerated that so well previously.  Please advise.  Charyl Bigger, CMA

## 2019-08-03 NOTE — Telephone Encounter (Signed)
Good Afternoon Tonya, Can you please call Rebecca Robertson and recommend changing from Atorvastatin 40mg  to Rosuvastatin 10mg - Less incidence of myalgia's. Is she agreeable. Thanks! Valetta Fuller

## 2019-08-04 ENCOUNTER — Other Ambulatory Visit: Payer: Self-pay | Admitting: Adult Health

## 2019-08-04 MED ORDER — LOVASTATIN 40 MG PO TABS: ORAL_TABLET | ORAL | 0 refills | Status: DC

## 2019-08-04 NOTE — Telephone Encounter (Signed)
Good Evening Tonya, Atorvastatin d/c'd, Lovastatin sent in. Please let Ms. Smoot know. Thanks! Valetta Fuller

## 2019-08-08 NOTE — Telephone Encounter (Signed)
Spoke with patient, patient verbalized understanding that she is going back on her previous medication.  She will pick up medication today.

## 2019-08-25 LAB — HM DIABETES EYE EXAM

## 2019-08-28 ENCOUNTER — Other Ambulatory Visit: Payer: Self-pay

## 2019-08-28 ENCOUNTER — Telehealth: Payer: Self-pay

## 2019-08-28 NOTE — Telephone Encounter (Signed)
Received fax from [facility] requesting refill on trimethoprim.  Request faxed back to pharmacy denying RX.  Charyl Bigger, CMA

## 2019-08-28 NOTE — Telephone Encounter (Signed)
Pt called inquiring why refill request for trimethoprim was denied.  Advised pt that if she needs antibiotics, it will require a visit with Mina Marble.  Pt states that she has been taking this medication for "years for recurrent UTIs".  Advised pt that in 11/2018 she advised that she was no longer taking this medication and that she would need to contact her urologist, Dr. Matilde Sprang, for refills.  Pt expressed understanding.  Charyl Bigger, CMA

## 2019-10-03 ENCOUNTER — Other Ambulatory Visit: Payer: Self-pay | Admitting: Family Medicine

## 2019-10-03 DIAGNOSIS — Z1231 Encounter for screening mammogram for malignant neoplasm of breast: Secondary | ICD-10-CM

## 2019-10-18 ENCOUNTER — Ambulatory Visit (INDEPENDENT_AMBULATORY_CARE_PROVIDER_SITE_OTHER): Payer: Managed Care, Other (non HMO) | Admitting: Family Medicine

## 2019-10-18 ENCOUNTER — Telehealth: Payer: Self-pay | Admitting: Adult Health

## 2019-10-18 ENCOUNTER — Other Ambulatory Visit: Payer: Self-pay

## 2019-10-18 ENCOUNTER — Other Ambulatory Visit: Payer: Managed Care, Other (non HMO)

## 2019-10-18 ENCOUNTER — Encounter: Payer: Self-pay | Admitting: Family Medicine

## 2019-10-18 ENCOUNTER — Ambulatory Visit: Payer: Managed Care, Other (non HMO) | Admitting: Adult Health

## 2019-10-18 VITALS — BP 104/67 | HR 59 | Temp 97.5°F | Resp 12 | Ht 64.0 in | Wt 128.0 lb

## 2019-10-18 DIAGNOSIS — E1169 Type 2 diabetes mellitus with other specified complication: Secondary | ICD-10-CM

## 2019-10-18 DIAGNOSIS — E0800 Diabetes mellitus due to underlying condition with hyperosmolarity without nonketotic hyperglycemic-hyperosmolar coma (NKHHC): Secondary | ICD-10-CM

## 2019-10-18 DIAGNOSIS — E785 Hyperlipidemia, unspecified: Secondary | ICD-10-CM

## 2019-10-18 MED ORDER — FENOFIBRATE 145 MG PO TABS: 145.0000 mg | ORAL_TABLET | Freq: Every day | ORAL | 0 refills | Status: DC

## 2019-10-18 NOTE — Patient Instructions (Addendum)
Guidelines for a Low Cholesterol, Low Saturated Fat Diet   Fats - Limit total intake of fats and oils. - Avoid butter, stick margarine, shortening, lard, palm and coconut oils. - Limit mayonnaise, salad dressings, gravies and sauces, unless they are homemade with low-fat ingredients. - Limit chocolate. - Choose low-fat and nonfat products, such as low-fat mayonnaise, low-fat or non-hydrogenated peanut butter, low-fat or fat-free salad dressings and nonfat gravy. - Use vegetable oil, such as canola or olive oil. - Look for margarine that does not contain trans fatty acids. - Use nuts in moderate amounts. - Read ingredient labels carefully to determine both amount and type of fat present in foods. Limit saturated and trans fats! - Avoid high-fat processed and convenience foods.  Meats and Meat Alternatives - Choose fish, chicken, [place] and lean meats. - Use dried beans, peas, lentils and tofu. - Limit egg yolks to three to four per week. - If you eat red meat, limit to no more than three servings per week and choose loin or round cuts. - Avoid fatty meats, such as bacon, sausage, franks, luncheon meats and ribs. - Avoid all organ meats, including liver.  Dairy - Choose nonfat or low-fat milk, yogurt and cottage cheese. - Most cheeses are high in fat. Choose cheeses made from non-fat milk, such as mozzarella and ricotta cheese. - Choose light or fat-free cream cheese and sour cream. - Avoid cream and sauces made with cream.  Fruits and Vegetables - Eat a wide variety of fruits and vegetables. - Use lemon juice, vinegar or "mist" olive oil on vegetables. - Avoid adding sauces, fat or oil to vegetables.  Breads, Cereals and Grains - Choose whole-grain breads, cereals, pastas and rice. - Avoid high-fat snack foods, such as granola, cookies, pies, pastries, doughnuts and croissants.  Cooking Tips - Avoid deep fried foods. - Trim visible fat off meats and remove skin from poultry  before cooking. - Bake, broil, boil, poach or roast poultry, fish and lean meats. - Drain and discard fat that drains out of meat as you cook it. - Add little or no fat to foods. - Use vegetable oil sprays to grease pans for cooking or baking. - Steam vegetables. - Use herbs or no-oil marinades to flavor foods.       The quick and dirty--> lower triglyceride levels more through...  1) - Beware of bad fats: Cutting back on saturated fat (in red meat and full-fat dairy foods) and trans fats (in restaurant fried foods and commercially prepared baked goods) can lower triglycerides.  2) - Go for good carbs: Easily digested carbohydrates (such as white bread, white rice, cornflakes, and sugary sodas) give triglycerides a definite boost.   3) - Eating whole grains and cutting back on soda can help control triglycerides.  4) - Check your alcohol use. In some people, alcohol dramatically boosts triglycerides. The only way to know if this is true for you is to avoid alcohol for a few weeks and have your triglycerides tested again.  5) - Go fish. Omega-3 fats in salmon, tuna, sardines, and other fatty fish can lower triglycerides. Having fish twice a week is fine.  6) - Aim for a healthy weight. If you are overweight, losing just 5% to 10% of your weight can help drive down triglycerides.  7) - Get moving. Exercise lowers triglycerides and boosts heart-healthy HDL cholesterol.  8) - quit smoking if you do  --> for more information, see below; or go to  www.heart.org  and do a search for desired topics   For those diagnosed with high triglycerides, it's important to take action to lower your levels and improve your heart health.  Triglyceride is just a fancy word for fat -- the fat in our bodies is stored in the form of triglycerides. Triglycerides are found in foods and manufactured in our bodies.  Normal triglyceride levels are defined as less than 150 mg/dL; 150 to 199 is considered  borderline high; 200 to 499 is high; and 500 or higher is officially called very high. To me, anything over 150 is a red flag indicating my patient needs to take immediate steps to get the situation under control.   What is the significance of high triglycerides? High triglyceride levels make blood thicker and stickier, which means that it is more likely to form clots. Studies have shown that triglyceride levels are associated with increased risks of cardiovascular disease and stroke -- in both men and women -- alone or in combination with other risk factors (high triglycerides combined with high LDL cholesterol can be a particularly deadly combination). For example, in one ground-breaking study, high triglycerides alone increased the risk of cardiovascular disease by 14 percent in men, and by 56 percent in women. But when the test subjects also had low HDL cholesterol (that's the good cholesterol) and other risk factors, high triglycerides increased the risk of disease by 32 percent in men and 76 percent in women.   Fortunately, triglycerides can sometimes be controlled with several diet and lifestyle changes.    What Factors Can Increase Triglycerides? As with cholesterol, eating too much of the wrong kinds of fats will raise your blood triglycerides.  Therefore, it's important to restrict the amounts of saturated fats and trans fats you allow into your diet.  Triglyceride levels can also shoot up after eating foods that are high in carbohydrates or after drinking alcohol.  That's why triglyceride blood tests require an overnight fast.  If you have elevated triglycerides, it's especially important to avoid sugary and refined carbohydrates, including sugar, honey, and other sweeteners, soda and other sugary drinks, candy, baked goods, and anything made with white (refined or enriched) flour, including white bread, rolls, cereals, buns, pastries, regular pasta, and white rice.  You'll also want to limit  dried fruit and fruit juice since they're dense in simple sugar.  All of these low-quality carbs cause a sudden rise in insulin, which may lead to a spike in triglycerides.  Triglycerides can also become elevated as a reaction to having diabetes, hypothyroidism, or kidney disease. As with most other heart-related factors, being overweight and inactive also contribute to abnormal triglycerides. And unfortunately, some people have a genetic predisposition that causes them to manufacture way too much triglycerides on their own, no matter how carefully they eat.     How Can You Lower Your Triglyceride Levels? If you are diagnosed with high triglycerides, it's important to take action. There are several things you can do to help lower your triglyceride levels and improve your heart health:  --> Lose weight if you are overweight.  There is a clear correlation between obesity and high triglycerides -- the heavier people are, the higher their triglyceride levels are likely to be. The good news is that losing weight can significantly lower triglycerides. In a large study of individuals with type 2 diabetes, those assigned to the "lifestyle intervention group" -- which involved counseling, a low-calorie meal plan, and customized exercise program -- lost 8.6% of their  body weight and lowered their triglyceride levels by more than 16%. If you're overweight, find a weight loss plan that works for you and commit to shedding the pounds and getting healthier.  --> Reduce the amount of saturated fat and trans fat in your diet.  Start by avoiding or dramatically limiting butter, cream cheese, lard, sour cream, doughnuts, cakes, cookies, candy bars, regular ice cream, fried foods, pizza, cheese sauce, cream-based sauces and salad dressings, high-fat meats (including fatty hamburgers, bologna, pepperoni, sausage, bacon, salami, pastrami, spareribs, and hot dogs), high-fat cuts of beef and pork, and whole-milk dairy  products.   Other ways to cut back: Choose lean meats only (including skinless chicken and [place], lean beef, lean pork), fish, and reduced-fat or fat-free dairy products.   Experiment with adding whole soy foods to your diet. Although soy itself may not reduce risk of heart disease, it replaces hazardous animal fats with healthier proteins. Choose high-quality soy foods, such as tofu, tempeh, soy milk, and edamame (whole soybeans).  Always remove skin from poultry.  Prepare foods by baking, roasting, broiling, boiling, poaching, steaming, grilling, or stir-frying in vegetable oil.  Most stick margarines contain trans fats, and trans fats are also found in some packaged baked goods, potato chips, snack foods, fried foods, and fast food that use or create hydrogenated oils.    (All food labels must now list the amount of trans fats, right after the amount of saturated fats -- good news for consumers. As a result, many food companies have now reformulated their products to be trans fat free.many, but not all! So it's still just as important to read labels and make sure the packaged foods you buy don't contain trans fats.)     If you use margarine, purchase soft-tub margarine spreads that contain 0 grams trans fats and don't list any partially hydrogenated oils in the ingredients list. By substituting olive oil or vegetable oil for trans fats in just 2 percent of your daily calories, you can reduce your risk of heart disease by 53 percent.   There is no safe amount of trans fats, so try to keep them as far from your plate as possible.  -->  Avoid foods that are concentrated in sugar (even dried fruit and fruit juice). Sugary foods can elevate triglyceride levels in the blood, so keep them to a bare minimum.  --> Swap out refined carbohydrates for whole grains.  Refined carbohydrates -- like white rice, regular pasta, and anything made with white or "enriched" flour (including white bread, rolls,  cereals, buns, and crackers) -- raise blood sugar and insulin levels more than fiber-rich whole grains. Higher insulin levels, in turn, can lead to a higher rise in triglycerides after a meal. So, make the switch to whole wheat bread, whole grain pasta, brown or wild rice, and whole grain versions of cereals, crackers, and other bread products. However, it's important to know that individuals with high triglycerides should moderate even their intake of high-quality starches (since all starches raise blood sugar) -- I recommend 1 to 2 servings per meal.  --> Cut way back on alcohol.  If you have high triglycerides, alcohol should be considered a rare treat -- if you indulge at all, since even small amounts of alcohol can dramatically increase triglyceride levels.  --> Incorporate omega-3 fats.  Heart-healthy fish oils are especially rich in omega-3 fatty acids. In multiple studies over the past two decades, people who ate diets high in omega-3s had 30 to 40  percent reductions in heart disease. Although we don't yet know why fish oil works so well, there are several possibilities. Omega-3s seem to reduce inflammation, reduce high blood pressure, decrease triglycerides, raise HDL cholesterol, and make blood thinner and less sticky so it is less likely to clot. It's as close to a food prescription for heart health as it gets. If you have high triglycerides, I recommend eating at least three servings of one of the omega-3-rich fish every week (fatty fish is the most concentrated food form of omega three fats). If you cannot manage to eat that much fish, speak with your physician about taking fish oil capsules, which offer similar benefits.The best foods for omega-3 fatty acids include wild salmon (fresh, canned), herring, mackerel (not king), sardines, anchovies, rainbow trout, and Pacific oysters. Non-fish sources of omega-3 fats include omega-3-fortified eggs, ground flaxseed, chia seeds, walnuts, butternuts  (white walnuts), seaweed, walnut oil, canola oil, and soybeans.  --> Quit smoking.  Smoking causes inflammation, not just in your lungs, but throughout your body. Inflammation can contribute to atherosclerosis, blood clots, and risk of heart attack. Smoking makes all heart health indicators worse. If you have high cholesterol, high triglycerides, or high blood pressure, smoking magnifies the danger.  --> Become more physically active.  Even moderate exercise can help improve cholesterol, triglycerides, and blood pressure. Aerobic exercise seems to be able to stop the sharp rise of triglycerides after eating, perhaps because of a decrease in the amount of triglyceride released by the liver, or because active muscle clears triglycerides out of the blood stream more quickly than inactive muscle. If you haven't exercised regularly (or at all) for years, I recommend starting slowly, by walking at an easy pace for 15 minutes a day. Then, as you feel more comfortable, increase the amount. Your ultimate goal should be at least 30 minutes of moderate physical activity, at least five days a week.

## 2019-10-18 NOTE — Telephone Encounter (Signed)
Patient called back to the clinic in regards to the Fenofibrate.   Per patient Dr. Buddy Duty d/c Fenofibrate back in July. Patient was then only taking Lovastatin. In December Katy had patient d/c Lovastatin and start taking Atorvastatin which cause patient sever cramp and joint pain. Patient the d/c Atorvastatin and started taking Lovastatin again.   Per patient she does not need to be taking Fenofibrate and Lovastatin per a conversation she had with Dr. Buddy Duty in July of 2020.    Please advise. AS, CMA

## 2019-10-18 NOTE — Telephone Encounter (Signed)
Please read [name] last office visit and the notes from the lab results-which specifically state this treatment plan. per Rebecca Robertson's notes patient was supposed to start fenofibrate in addition to the lovastatin.  She did not want a change to another statin which would be a stronger dose.  She preferred just to restart the fenofibrate in addition to her lovastatin.  If patient does not wish to take the lovastatin plus the fenofibrate, please remove the fenofibrate from her med list and document that patient did not want to take it.

## 2019-10-18 NOTE — Progress Notes (Addendum)
Impression and Recommendations:    1. Hyperlipidemia associated with type 2 diabetes mellitus ([place])   2. Diabetes mellitus due to underlying condition with hyperosmolarity without coma, without long-term current use of insulin ([place])      Diabetes Mellitus - Monitored and mgt by Dr. Buddy Duty of endocrinology. - Continue treatment plan as established by specialist.  See med list.  - A1c 6.8 last check, stable. - Per patient, can see Dr. Buddy Duty once per year as long as she sees her PCP every six months.  -  need for microalbuminuria assessment today.  - Encouraged patient to continue to defer to endocrinologist for modification of treatment plan.  - Counseled patient on pathophysiology of disease and discussed various treatment options, which always includes dietary and lifestyle modification as first line.    - Importance of low carb, heart-healthy diet discussed with patient in addition to regular aerobic exercise of 68mn 5d/week or more.   - Advised patient to continue checking FBS and 2 hours after the biggest meal of her day.  - Discussed option of referral to diabetic nutritionist if desired.  - Will continue to monitor alongside specialist.  Hyperlipidemia associated with Type 2 DM - Last FLP obtained December of 2020. - Treatment plan changed to atorvastatin from lovastatin early December 2020. - Patient was formerly on Fenofibrate, discontinued in the past. -  Because LDL not at goal last check; re-started fenofibrate on 07/26/2019.  -  Medication changes were never sent in, and patient never started them. - Told patient to begin treatment plan on  lovastatin and fenofibrate as advised.  See med list.  - Dietary changes such as low saturated & trans fat diets for hyperlipidemia and low carb diets for hypertriglyceridemia discussed with patient.    - Encouraged patient to follow AHA guidelines for regular exercise.  - We will continue to monitor and re-check as  discussed.  Recommendations - Return for CPE and full fasting labs same day in June as scheduled.    Meds ordered this encounter  Medications  . fenofibrate (TRICOR) 145 MG tablet    Sig: Take 1 tablet (145 mg total) by mouth daily.    Dispense:  90 tablet    Refill:  0    Gross side effects, risk and benefits, and alternatives of medications and treatment plan in general discussed with patient.  Patient is aware that all medications have potential side effects and we are unable to predict every side effect or drug-drug interaction that may occur.   Patient will call with any questions prior to using medication if they have concerns.    Expresses verbal understanding and consents to current therapy and treatment regimen.  No barriers to understanding were identified.  Red flag symptoms and signs discussed in detail.  Patient expressed understanding regarding what to do in case of emergency\urgent symptoms  Please see AVS handed out to patient at the end of our visit for further patient instructions/ counseling done pertaining to today's office visit.   Return ( restarted Fibrate today - pt has meds at home) reck chol 3-450mofor keep CPE and FBW in June as scheduled.     Note:  This note was prepared with assistance of Dragon voice recognition software. Occasional wrong-word or sound-a-like substitutions may have occurred due to the inherent limitations of voice recognition software.   The [place] signed into law in 2016 which includes the topic of electronic health records.  This provides immediate access to information in MyChart.  This includes consultation notes, operative notes, office notes, lab results and pathology reports.  If you have any questions about what you read please let us know at your next visit or call us at the office.  We are right here with you.   This case required medical decision making of at least moderate complexity.  This document  serves as a record of services personally performed by Mellody Dance, DO. It was created on her behalf by Toni Amend, a trained medical scribe. The creation of this record is based on the scribe's personal observations and the provider's statements to them.   This case required medical decision making of at least moderate complexity. The above documentation from Toni Amend, medical scribe, has been reviewed by Marjory Sneddon, D.O.   --------------------------------------------------------------------------------------------------------------------------------------------------------------------------------------------------------------------------------------------  Subjective:     Phillips Odor, am serving as scribe for Dr.Jonothan Heberle.   HPI: Rebecca Robertson is a 64 y.o. female who presents to [place] [facility] today for issues as discussed below.   HPI:  Hyperlipidemia:  64 y.o. female here for cholesterol follow-up.   Per patient, she never received the changes to her treatment plan resuming fenofibrate; she continues taking the lovastatin alone.  - Patient denies any acute concerns or problems with management plan   - She denies new onset of: myalgias, arthralgias, increased fatigue more than normal, chest pains, exercise intolerance, shortness of breath, dizziness, visual changes, headache, lower extremity swelling or claudication.    Most recent cholesterol panel was:  Lab Results  Component Value Date   CHOL 168 07/19/2019   HDL 45 07/19/2019   LDLCALC 102 (H) 07/19/2019   TRIG 119 07/19/2019   CHOLHDL 3.7 07/19/2019   Hepatic Function Latest Ref Rng & Units 07/19/2019 11/11/2018 11/11/2017  Total Protein 6.0 - 8.5 g/dL 7.0 7.4 7.1  Albumin 3.8 - 4.8 g/dL 4.0 4.5 4.3  AST 0 - 40 IU/L _0 ALT 0 - 32 IU/L _1 Alk Phosphatase 39 - 117 IU/L 91 40 36(L)  Total Bilirubin 0.0 - 1.2 mg/dL 0.3 0.3 0.4     HPI:    Diabetes Mellitus:  Home glucose readings:  Says that her sugars used to run 120, 130; now they're running 140, 150.  Says her sugar depends on "the longer I wait in the morning."  She usually checks her sugar first thing when she wakes up, and sometimes she waits until 11 AM and her sugar is more normal.  Notes "I don't eat anymore."  Notes she eats one meal per day.  She does have a snack in the afternoon, and sometimes "something little in the afternoon."  Notes "I can't eat if I'm not hungry."  Says she went to a nutritionist about five years back and was told that she was doing everything fine.  Says her weight goes from 119-124 lbs.  Says her weight is maintained and she feels good.  Notes "I'm staying the same, and I'm okay with that."   - Patient reports good compliance with therapy plan: medication and/or lifestyle modification.  She likes her current diabetes medications.  - Her denies acute concerns or problems related to treatment plan  - She denies new concerns.  Denies polyuria/polydipsia, hypo/ hyperglycemia symptoms.  Denies new onset of: chest pain, exercise intolerance, shortness of breath, dizziness, visual changes, headache, lower extremity swelling or claudication.   Last A1C in  the office was:  Lab Results  Component Value Date   HGBA1C 6.8 (H) 07/19/2019   HGBA1C 7.3 03/15/2019   HGBA1C 7.2 (H) 11/11/2018   Lab Results  Component Value Date   MICROALBUR 1.05 03/15/2019   LDLCALC 102 (H) 07/19/2019   CREATININE 0.68 07/19/2019    HPI:  Hypertension:  -  Her blood pressure at home has been running: controlled  - Patient reports good compliance with medication and/or lifestyle modification  - Her denies acute concerns or problems related to treatment plan  - She denies new onset of: chest pain, exercise intolerance, shortness of breath, dizziness, visual changes, headache, lower extremity swelling or claudication.   Last 3 blood pressure readings in our  office are as follows: BP Readings from Last 3 Encounters:  10/18/19 104/67  07/19/19 118/74  07/04/19 113/63   Filed Weights   10/18/19 0913  Weight: 128 lb (58.1 kg)     Wt Readings from Last 3 Encounters:  10/18/19 128 lb (58.1 kg)  07/19/19 121 lb (54.9 kg)  07/04/19 129 lb 8 oz (58.7 kg)   BP Readings from Last 3 Encounters:  10/18/19 104/67  07/19/19 118/74  07/04/19 113/63   Pulse Readings from Last 3 Encounters:  10/18/19 (!) 59  07/19/19 63  07/04/19 66   BMI Readings from Last 3 Encounters:  10/18/19 21.97 kg/m  07/19/19 21.43 kg/m  07/04/19 22.94 kg/m     Patient Care Team    Relationship Specialty Notifications Start End  Mina Marble D, NP PCP - General Family Medicine  06/01/17   Teena Irani, MD (Inactive) Consulting Physician Gastroenterology  06/02/16   Delrae Rend, MD Consulting Physician Endocrinology  06/02/16   Druscilla Brownie, MD Consulting Physician Dermatology  06/02/16   Nicholas Lose, MD Consulting Physician Hematology and Oncology  06/02/16   Bjorn Loser, MD Consulting Physician Urology  06/02/16   Christain Sacramento, [place] Referring Physician Optometry  05/11/17      Patient Active Problem List   Diagnosis Date Noted  . Hypertriglyceridemia 05/28/2016  . Hyperlipidemia 03/04/2016  . Diabetes ([place]) 03/27/2014  . Bronchitis 07/04/2019  . Fatigue 03/27/2019  . Flu-like symptoms 09/26/2018  . Wheezing 09/26/2018  . Cough in adult 06/21/2018  . Vaginal yeast infection 03/28/2018  . Abnormal nuclear stress test   . Chest pain 12/07/2017  . Healthcare maintenance 11/11/2017  . Screening for cervical cancer 11/11/2017  . Personal history UTI- 04/07/17, + Cx- txed with abx 05/18/2017  . Urinary frequency 05/18/2017  . Diarrhea in adult patient 05/11/2017  . UTI (urinary tract infection) 04/07/2017  . Dysuria 04/07/2017  . Abnormal urinalysis 04/07/2017  . Cervical smear, as part of routine gynecological examination 11/09/2016    . Pneumonia 09/23/2016  . Depression 05/28/2016  . B12 deficiency 05/28/2016  . SUI (stress urinary incontinence, female) 05/28/2016  . Vaginal atrophy 03/25/2016  . h/o Anxiety 03/08/2016  . Vitamin D insufficiency 03/04/2016  . History of breast cancer 03/04/2016  . Contact dermatitis and eczema due to cause 03/04/2016  . Closed fracture of proximal phalanx of toe 04/03/2014  . Neoplasm of left breast, primary tumor staging category Tis: ductal carcinoma in situ (DCIS) 02/10/2011    Past Medical history, Surgical history, Family history, Social history, Allergies and Medications have been entered into the medical record, reviewed and changed as needed.    Current Meds  Medication Sig  . albuterol (VENTOLIN HFA) 108 (90 Base) MCG/ACT inhaler Inhale 2 puffs into the lungs every 6 (  six) hours as needed for wheezing or shortness of breath.  Marland Kitchen BAYER CONTOUR NEXT TEST test strip Check blood sugar daily  . citalopram (CELEXA) 20 MG tablet Take 1 tablet (20 mg total) by mouth daily.  . clobetasol ointment (TEMOVATE) 5.50 % Apply 1 application topically 2 (two) times daily.  . Fluocinolone Acetonide Body 0.01 % OIL Apply 1 application topically 2 (two) times daily as needed.  . lovastatin (MEVACOR) 40 MG tablet TAKE 1 TABLET BY MOUTH ONCE DAILY AT  6PM.  . nystatin-triamcinolone ointment (MYCOLOG) Apply 1 application topically 2 (two) times daily as needed.  Marland Kitchen oxybutynin (DITROPAN) 5 MG tablet Take 1 tablet (5 mg total) by mouth 2 (two) times daily.  . Semaglutide (OZEMPIC) 0.25 or 0.5 MG/DOSE SOPN Inject 0.25 mg into the skin every Sunday.  Marland Kitchen SYNJARDY 12.12-998 MG TABS Take 1 tablet 2 (two) times daily by mouth.   . vitamin B-12 (CYANOCOBALAMIN) 1000 MCG tablet Take 1,000 mcg by mouth every other day.   Current Facility-Administered Medications for the 10/18/19 encounter (Office Visit) with Mellody Dance, DO  Medication  . ipratropium-albuterol (DUONEB) 0.5-2.5 (3) MG/3ML nebulizer  solution 3 mL    Allergies:  Allergies  Allergen Reactions  . Compazine Other (See Comments)    Possible seizure, unable to talk, eyes rolled toward back of head, mouth became dry.  [name] [Exenatide]     Severe burning with injection     Review of Systems:  A fourteen system review of systems was performed and found to be positive as per HPI.   Objective:   Blood pressure 104/67, pulse (!) 59, temperature (!) 97.5 F (36.4 C), temperature source Oral, resp. rate 12, height 5' 4" (1.626 m), weight 128 lb (58.1 kg), SpO2 100 %. Body mass index is 21.97 kg/m. General:  Well Developed, well nourished, appropriate for stated age.  Neuro:  Alert and oriented,  extra-ocular muscles intact  HEENT:  Normocephalic, atraumatic, neck supple, no carotid bruits appreciated  Skin:  no gross rash, warm, pink. Cardiac:  RRR, S1 S2 Respiratory:  ECTA B/L and A/P, Not using accessory muscles, speaking in full sentences- unlabored. Vascular:  Ext warm, no cyanosis apprec.; cap RF less 2 sec. Psych:  No HI/SI, judgement and insight good, Euthymic mood. Full Affect.

## 2019-10-18 NOTE — Telephone Encounter (Signed)
Patient was seen this morning for an OV with Dr. Jenetta Downer and when she got home she noticed what she believes in some discrepancies in her meds and is requesting a call back from nurse to review this. CMA unavailable at time of call.

## 2019-10-19 NOTE — Telephone Encounter (Signed)
Lacretia Nicks M   WB   9:09 AM Note   Spoke with patient, patient verbalized understanding that she is going back on her previous medication.  She will pick up medication today.     August 04, 2019 Esaw Grandchild, NP to Fonnie Mu, CMA      5:43 PM Note   Good Evening Tonya, Atorvastatin d/c'd, Lovastatin sent in. Please let Ms. Redmon know. Thanks! Katy    August 03, 2019 Fonnie Mu, CMA to Mina Marble D, NP      4:25 PM Note   Pt states that she would prefer to go back on the lovastatin since she tolerated that so well previously.  Please advise.  Charyl Bigger, CMA    [place], Berna Spare, NP to Fonnie Mu, CMA      12:10 PM Note   Good Carollee Herter, Can you please call Ms. Gellert and recommend changing from Atorvastatin 40mg  to Rosuvastatin 10mg - Less incidence of myalgia's. Is she agreeable. Thanks! Katy        WB   10:07 AM Joylene John routed this conversation to Esaw Grandchild, NP  Joylene John   WB   10:07 AM Note   Patient called into the office about the side effects she is having on Lipitor.  Patient is having severe joint pain.  She called the pharmacy and was told to contact her provider that prescribed the medication.  Patient does not want to continue on this medication due to the side effects.  What are her next steps?  Please call her at [phone].

## 2019-10-19 NOTE — Telephone Encounter (Signed)
Per Dr. Raliegh Scarlet patient should be taking Fenofibrate along with Lovastatin.  Called patient to advise. Left message for her to return call. AS, CMA

## 2019-10-19 NOTE — Telephone Encounter (Signed)
Fonnie Mu, CMA  07/26/2019  2:26 PM EST    07/26/2019  Pt informed that Valetta Fuller recommends Atorvastatin and not resuming fenofibrate.  RX sent to pharmacy.  Charyl Bigger, CMA   Fonnie Mu, CMA  07/26/2019  8:17 AM EST    07/26/2019  LVM for pt to call to discuss.  Charyl Bigger, CMA   Esaw Grandchild, NP  07/25/2019  5:08 PM EST    Good Evening Kenney Houseman, Please call Ms. Elizondo and share- Statins offer the best ability to lower LDL- recommend Atorvastatin 20mg  Thanks! Spero Geralds, CMA  07/25/2019  1:51 PM EST    07/25/2019  Pt informed of results.  Pt expressed understanding and states that Dr. Buddy Duty DC'd her fenofibrate in July.  Pt wonders if she should just restart this medication rather than switching to atorvastatin.  Please advise.  Charyl Bigger, CMA   Fonnie Mu, CMA  07/25/2019 11:48 AM EST    07/25/2019  LVM for pt to call to discuss results.  Charyl Bigger, CMA   Esaw Grandchild, NP  07/20/2019 10:11 AM EST    Good Morning Kenney Houseman, Can you please call Ms. Ortlieb and share- CMP-stable CBC-stable Mg++ level- normal A1c-6.8- GREAT! Decreased from 7.3 four months ago The 10-year ASCVD risk score Mikey Bussing DC Brooke Bonito., et al., 2013) is: 6.6%   Values used to calculate the score:     Age: 64 years     Sex: Female     Is Non-Hispanic African American: No     Diabetic: Yes     Tobacco smoker: No     Systolic Blood Pressure: 123456 mmHg     Is BP treated: No     HDL Cholesterol: 45 mg/dL     Total Cholesterol: 168 mg/dL LDL-102 With her diabeted LDL goal <70 Recommend increasing statin. Her insurance does not cover any higher dose of Lovastatin. Recommend changing to Atorvastatin 20mg - is she agreeable? If so- will need to check hepatic fx/lipids in 6 weeks. Thanks! Katy    Lab result notes from 07/2019

## 2019-10-19 NOTE — Telephone Encounter (Signed)
Patient states she will discuss if she needs to take Fenofibrate with Dr. Buddy Duty. She declines starting this medication until then due to extreme joint pain. She has an apt in June and states she will discuss fenofibrate at that time. AS, CMA

## 2019-10-24 ENCOUNTER — Other Ambulatory Visit: Payer: Self-pay | Admitting: Adult Health

## 2019-12-09 ENCOUNTER — Ambulatory Visit
Admission: EM | Admit: 2019-12-09 | Discharge: 2019-12-09 | Disposition: A | Discharge status: Home / Self Care | Payer: Managed Care, Other (non HMO) | Attending: Emergency Medicine | Admitting: Emergency Medicine

## 2019-12-09 ENCOUNTER — Encounter: Payer: Self-pay | Admitting: Emergency Medicine

## 2019-12-09 ENCOUNTER — Other Ambulatory Visit: Payer: Self-pay

## 2019-12-09 DIAGNOSIS — L03011 Cellulitis of right finger: Secondary | ICD-10-CM | POA: Diagnosis not present

## 2019-12-09 DIAGNOSIS — S61451A Open bite of right hand, initial encounter: Secondary | ICD-10-CM | POA: Diagnosis not present

## 2019-12-09 DIAGNOSIS — Z23 Encounter for immunization: Secondary | ICD-10-CM | POA: Diagnosis not present

## 2019-12-09 DIAGNOSIS — L089 Local infection of the skin and subcutaneous tissue, unspecified: Secondary | ICD-10-CM | POA: Diagnosis not present

## 2019-12-09 MED ORDER — LIDOCAINE-EPINEPHRINE-TETRACAINE (LET) TOPICAL GEL
3.0000 mL | Freq: Once | TOPICAL | Status: AC
  Administered 2019-12-09: 3 mL via TOPICAL

## 2019-12-09 MED ORDER — AMOXICILLIN-POT CLAVULANATE 875-125 MG PO TABS
1.0000 | ORAL_TABLET | Freq: Two times a day (BID) | ORAL | 0 refills | Status: AC
Start: 2019-12-09 — End: 2019-12-14

## 2019-12-09 MED ORDER — TETANUS-DIPHTH-ACELL PERTUSSIS 5-2.5-18.5 LF-MCG/0.5 IM SUSP
0.5000 mL | Freq: Once | INTRAMUSCULAR | Status: AC
  Administered 2019-12-09: 0.5 mL via INTRAMUSCULAR

## 2019-12-09 NOTE — ED Triage Notes (Signed)
Patient has pus around right index finger nailbed.  Noticed 2 days ago.  Patient does bite finger nails.  Right index finger is swollen and red

## 2019-12-09 NOTE — ED Provider Notes (Signed)
[facility]    CSN: [id number] Arrival date & time: 12/09/19  1155      History   Chief Complaint Chief Complaint  Patient presents with   Hand Pain    HPI Rebecca Robertson is a 64 y.o. female history diabetes, breast cancer presenting for paronychia of right fingernail.  No cyst 2 days ago.  Patient does have a habit of biting her fingernails.  Denying hand pain, arthralgias, myalgias, fever.   Past Medical History:  Diagnosis Date   Anxiety    Diabetes mellitus    History of breast cancer ONCOLOGIST-- DR Humphrey Rolls--  NO RECURRENCE   S/P LEFT PARTIAL MASTECTOMY W/ SLN BX'S  12-02-2010---  DUCTAL CARCINOMA IN SITU--  S/P RADIATION THERAPY ENDED 02-17-2011   Hyperlipidemia    Personal history of radiation therapy 2012   Pneumonia    PONV (postoperative nausea and vomiting)    SUI (stress urinary incontinence, female)    Vitamin D deficiency    Vitamin D insufficiency 03/04/2016    Patient Active Problem List   Diagnosis Date Noted   Bronchitis 07/04/2019   Fatigue 03/27/2019   Flu-like symptoms 09/26/2018   Wheezing 09/26/2018   Cough in adult 06/21/2018   Vaginal yeast infection 03/28/2018   Abnormal nuclear stress test    Chest pain 12/07/2017   Healthcare maintenance 11/11/2017   Screening for cervical cancer 11/11/2017   Personal history UTI- 04/07/17, + Cx- txed with abx 05/18/2017   Urinary frequency 05/18/2017   Diarrhea in adult patient 05/11/2017   UTI (urinary tract infection) 04/07/2017   Dysuria 04/07/2017   Abnormal urinalysis 04/07/2017   Cervical smear, as part of routine gynecological examination 11/09/2016   Pneumonia 09/23/2016   Depression 05/28/2016   B12 deficiency 05/28/2016   SUI (stress urinary incontinence, female) 05/28/2016   Hypertriglyceridemia 05/28/2016   Vaginal atrophy 03/25/2016   h/o Anxiety 03/08/2016   Hyperlipidemia 03/04/2016   Vitamin D insufficiency 03/04/2016    History of breast cancer 03/04/2016   Contact dermatitis and eczema due to cause 03/04/2016   Closed fracture of proximal phalanx of toe 04/03/2014   Diabetes ([place]) 03/27/2014   Neoplasm of left breast, primary tumor staging category Tis: ductal carcinoma in situ (DCIS) 02/10/2011    Past Surgical History:  Procedure Laterality Date   BLADDER SUSPENSION N/A 02/03/2013   Procedure: [facility] PROCEDURE;  Surgeon: Reece Packer, MD;  Location: [place];  Service: Urology;  Laterality: N/A;   BREAST LUMPECTOMY Left 2012   BUNIONECTOMY     CESAREAN SECTION     X4   LEFT HEART CATH AND CORONARY ANGIOGRAPHY N/A 01/26/2018   Procedure: LEFT HEART CATH AND CORONARY ANGIOGRAPHY;  Surgeon: Wellington Hampshire, MD;  Location: [place] [facility];  Service: Cardiovascular;  Laterality: N/A;   MENISCUS REPAIR Left    PARTIAL MASTECTOMY WITH AXILLARY SENTINEL LYMPH NODE BIOPSY Left 12-02-2010    OB History   No obstetric history on file.      Home Medications    Prior to Admission medications   Medication Sig Start Date End Date Taking? Authorizing Provider  citalopram (CELEXA) 20 MG tablet Take 1 tablet by mouth once daily 10/24/19  Yes Opalski, Neoma Laming, DO  fenofibrate (TRICOR) 145 MG tablet Take 1 tablet (145 mg total) by mouth daily. 10/18/19  Yes Opalski, Deborah, DO  lovastatin (MEVACOR) 40 MG tablet TAKE 1 TABLET BY MOUTH ONCE DAILY AT  6  PM 10/24/19  Yes Opalski, Neoma Laming,  DO  oxybutynin (DITROPAN) 5 MG tablet Take 1 tablet by mouth twice daily 10/24/19  Yes Opalski, Neoma Laming, DO  Semaglutide (OZEMPIC) 0.25 or 0.5 MG/DOSE SOPN Inject 0.25 mg into the skin every Sunday.   Yes [provider]  SYNJARDY 12.12-998 MG TABS Take 1 tablet 2 (two) times daily by mouth.  08/26/15  Yes [provider]  vitamin B-12 (CYANOCOBALAMIN) 1000 MCG tablet Take 1,000 mcg by mouth every other day.   Yes [provider]  albuterol (VENTOLIN HFA) 108 (90 Base)  MCG/ACT inhaler Inhale 2 puffs into the lungs every 6 (six) hours as needed for wheezing or shortness of breath. 07/04/19   Danford, Valetta Fuller D, NP  amoxicillin-clavulanate (AUGMENTIN) 875-125 MG tablet Take 1 tablet by mouth every 12 (twelve) hours for 5 days. 12/09/19 12/14/19  Hall-Potvin, [place], PA-C  BAYER CONTOUR NEXT TEST test strip Check blood sugar daily 01/18/16   [provider]  clobetasol ointment (TEMOVATE) [id number] % Apply 1 application topically 2 (two) times daily. 02/02/18   Danford, Valetta Fuller D, NP  Fluocinolone Acetonide Body 0.01 % OIL Apply 1 application topically 2 (two) times daily as needed. 02/02/18   Danford, Valetta Fuller D, NP  nystatin-triamcinolone ointment (MYCOLOG) Apply 1 application topically 2 (two) times daily as needed. 07/19/19   Mina Marble D, NP    Family History Family History  Problem Relation Age of Onset   Cancer Mother        lung   Heart attack Father    COPD Father    Heart disease Father     Social History Social History   Tobacco Use   Smoking status: Never Smoker   Smokeless tobacco: Never Used  Substance Use Topics   Alcohol use: No   Drug use: No     Allergies   Compazine and Bydureon [exenatide]   Review of Systems As per HPI   Physical Exam Triage Vital Signs ED Triage Vitals  Enc Vitals Group     BP      Pulse      Resp      Temp      Temp src      SpO2      Weight      Height      Head Circumference      Peak Flow      Pain Score      Pain Loc      Pain Edu?      Excl. in [place]?    No data found.  Updated Vital Signs BP 106/67 (BP Location: Right Leg)    Pulse 72    Temp 98.3 F (36.8 C) (Oral)    Resp 18    SpO2 97%   Visual Acuity Right Eye Distance:   Left Eye Distance:   Bilateral Distance:    Right Eye Near:   Left Eye Near:    Bilateral Near:     Physical Exam Constitutional:      General: She is not in acute distress. HENT:     Head: Normocephalic and atraumatic.  Eyes:     General: No  scleral icterus.    Pupils: Pupils are equal, round, and reactive to light.  Cardiovascular:     Rate and Rhythm: Normal rate.  Pulmonary:     Effort: Pulmonary effort is normal.  Musculoskeletal:        General: Normal range of motion.  Skin:    Coloration: Skin is not jaundiced or pale.  Comments: Paronychia noted to right index finger, medial aspect.  No open wound, active discharge, streaking.    Neurological:     Mental Status: She is alert and oriented to person, place, and time.     Sensory: No sensory deficit.      UC Treatments / Results  Labs (all labs ordered are listed, but only abnormal results are displayed) Labs Reviewed - No data to display  EKG   Radiology No results found.  Procedures Incision and Drainage  Date/Time: 12/09/2019 1:34 PM Performed by: Quincy Sheehan, PA-C Authorized by: Quincy Sheehan, PA-C   Consent:    Consent obtained:  Verbal   Consent given by:  Patient   Risks discussed:  Bleeding, incomplete drainage, pain and damage to other organs   Alternatives discussed:  No treatment Universal protocol:    Patient identity confirmed:  Verbally with patient Location:    Type:  Abscess   Size:  1 cm   Location:  Upper extremity   Upper extremity location:  Finger   Finger location:  R index finger Pre-procedure details:    Procedure prep: alcohol swab. Anesthesia (see MAR for exact dosages):    Anesthesia method:  Local infiltration and nerve block   Local anesthetic: LET get.   Block location:  Right 2nd MCP   Block needle gauge:  25 G   Block anesthetic:  Lidocaine 2% w/o epi   Block technique:  Digital   Block injection procedure:  Anatomic landmarks identified, anatomic landmarks palpated, introduced needle, negative aspiration for blood and incremental injection   Block outcome:  Anesthesia achieved Procedure type:    Complexity:  Complex Procedure details:    Needle aspiration: no     Incision types:   Single straight   Incision depth:  Subcutaneous   Scalpel blade:  11   Drainage:  Purulent and bloody   Drainage amount:  Moderate   Wound treatment:  Wound left open Post-procedure details:    Patient tolerance of procedure:  Tolerated well, no immediate complications   (including critical care time)  Medications Ordered in UC Medications  lidocaine-EPINEPHrine-tetracaine (LET) topical gel (3 mLs Topical Given 12/09/19 1251)  Tdap (BOOSTRIX) injection 0.5 mL (0.5 mLs Intramuscular Given 12/09/19 1244)    Initial Impression / Assessment and Plan / UC Course  I have reviewed the triage vital signs and the nursing notes.  Pertinent labs & imaging results that were available during my care of the patient were reviewed by me and considered in my medical decision making (see chart for details).     Patient febrile, nontoxic in office today.  Patient requesting letter prior to digital block: Done so which patient tolerated well.  I&D performed which patient tolerated well.  Will follow with course of antibiotics as outlined below.  Tetanus updated.  Return precautions discussed, patient verbalized understanding and is agreeable to plan. Final Clinical Impressions(s) / UC Diagnoses   Final diagnoses:  Paronychia of right index finger  Infection of right hand due to bite, initial encounter     Discharge Instructions     Keep area(s) clean and dry. Apply hot compress / towel for 5-10 minutes 3-5 times daily. Take antibiotic as prescribed with food - important to complete course. Return for worsening pain, redness, swelling, discharge, fever.    ED Prescriptions    Medication Sig Dispense Auth. Provider   amoxicillin-clavulanate (AUGMENTIN) 875-125 MG tablet Take 1 tablet by mouth every 12 (twelve) hours for 5 days. 10 tablet  Hall-Potvin, [place], [place]     PDMP not reviewed this encounter.   Hall-Potvin, [place], [place] 12/09/19 1336

## 2019-12-09 NOTE — Discharge Instructions (Addendum)
Keep area(s) clean and dry. Apply hot compress / towel for 5-10 minutes 3-5 times daily. Take antibiotic as prescribed with food - important to complete course. Return for worsening pain, redness, swelling, discharge, fever 

## 2019-12-09 NOTE — ED Notes (Signed)
Patient not available for discharge

## 2019-12-26 ENCOUNTER — Other Ambulatory Visit: Payer: Self-pay | Admitting: Physician Assistant

## 2019-12-26 DIAGNOSIS — R109 Unspecified abdominal pain: Secondary | ICD-10-CM

## 2019-12-26 LAB — HEPATIC FUNCTION PANEL
ALT: 10 (ref 7–35)
AST: 11 — AB (ref 13–35)
Alkaline Phosphatase: 67 (ref 25–125)
Bilirubin, Total: 0.4

## 2019-12-26 LAB — TSH: TSH: 1.53 (ref 0.41–5.90)

## 2019-12-26 LAB — COMPREHENSIVE METABOLIC PANEL
Albumin: 4.3 (ref 3.5–5.0)
Calcium: 9.9 (ref 8.7–10.7)
GFR calc Af Amer: 135
GFR calc non Af Amer: 112

## 2019-12-26 LAB — BASIC METABOLIC PANEL
BUN: 15 (ref 4–21)
CO2: 30 — AB (ref 13–22)
Chloride: 103 (ref 99–108)
Creatinine: 0.6 (ref 0.5–1.1)
Glucose: 117
Potassium: 4.3 (ref 3.4–5.3)
Sodium: 142 (ref 137–147)

## 2019-12-26 LAB — CBC AND DIFFERENTIAL
HCT: 41 (ref 36–46)
Hemoglobin: 13.9 (ref 12.0–16.0)
Platelets: 242 (ref 150–399)
WBC: 8.3

## 2019-12-26 LAB — LIPASE: Lipase: 37

## 2019-12-26 LAB — CBC: RBC: 4.64 (ref 3.87–5.11)

## 2020-01-02 ENCOUNTER — Other Ambulatory Visit: Payer: Self-pay

## 2020-01-02 ENCOUNTER — Ambulatory Visit
Admission: RE | Admit: 2020-01-02 | Discharge: 2020-01-02 | Disposition: A | Discharge status: Home / Self Care | Payer: Self-pay | Source: Ambulatory Visit | Attending: Physician Assistant | Admitting: Physician Assistant

## 2020-01-02 DIAGNOSIS — R109 Unspecified abdominal pain: Secondary | ICD-10-CM

## 2020-01-02 MED ORDER — IOPAMIDOL (ISOVUE-300) INJECTION 61%
100.0000 mL | Freq: Once | INTRAVENOUS | Status: AC | PRN
  Administered 2020-01-02: 100 mL via INTRAVENOUS

## 2020-01-08 ENCOUNTER — Other Ambulatory Visit: Payer: Self-pay | Admitting: Physician Assistant

## 2020-01-11 ENCOUNTER — Other Ambulatory Visit: Payer: Managed Care, Other (non HMO)

## 2020-01-22 ENCOUNTER — Other Ambulatory Visit: Payer: Self-pay | Admitting: Family Medicine

## 2020-01-23 ENCOUNTER — Telehealth: Payer: Self-pay | Admitting: Physician Assistant

## 2020-01-23 MED ORDER — CITALOPRAM HYDROBROMIDE 20 MG PO TABS: 20.0000 mg | ORAL_TABLET | Freq: Every day | ORAL | 0 refills | Status: DC

## 2020-01-23 NOTE — Addendum Note (Signed)
Addended by: Mickel Crow on: 01/23/2020 04:29 PM   Modules accepted: Orders

## 2020-01-23 NOTE — Addendum Note (Signed)
Addended by: Mickel Crow on: 01/23/2020 11:17 AM   Modules accepted: Orders

## 2020-01-23 NOTE — Telephone Encounter (Signed)
#  60 day supply sent to pharmacy per refill protocol. AS, CMA

## 2020-01-23 NOTE — Telephone Encounter (Signed)
Patient is requesting a refill of her Celexa and is requesting a 90 day supply for insurance coverage. If approved please send to [facility] on [address]

## 2020-01-25 ENCOUNTER — Encounter: Payer: Managed Care, Other (non HMO) | Admitting: Family Medicine

## 2020-01-29 ENCOUNTER — Ambulatory Visit
Admission: RE | Admit: 2020-01-29 | Discharge: 2020-01-29 | Disposition: A | Discharge status: Home / Self Care | Payer: Managed Care, Other (non HMO) | Source: Ambulatory Visit | Attending: Family Medicine | Admitting: Family Medicine

## 2020-01-29 ENCOUNTER — Other Ambulatory Visit: Payer: Self-pay

## 2020-01-29 DIAGNOSIS — Z1231 Encounter for screening mammogram for malignant neoplasm of breast: Secondary | ICD-10-CM

## 2020-02-01 ENCOUNTER — Other Ambulatory Visit: Payer: Self-pay

## 2020-02-01 ENCOUNTER — Encounter: Payer: Self-pay | Admitting: Physician Assistant

## 2020-02-01 ENCOUNTER — Other Ambulatory Visit (HOSPITAL_COMMUNITY)
Admission: RE | Admit: 2020-02-01 | Discharge: 2020-02-01 | Disposition: A | Discharge status: Home / Self Care | Payer: Managed Care, Other (non HMO) | Source: Ambulatory Visit | Attending: Physician Assistant | Admitting: Physician Assistant

## 2020-02-01 ENCOUNTER — Ambulatory Visit (INDEPENDENT_AMBULATORY_CARE_PROVIDER_SITE_OTHER): Payer: Managed Care, Other (non HMO) | Admitting: Physician Assistant

## 2020-02-01 VITALS — BP 99/57 | HR 63 | Temp 98.0°F | Ht 64.0 in | Wt 119.8 lb

## 2020-02-01 DIAGNOSIS — E538 Deficiency of other specified B group vitamins: Secondary | ICD-10-CM

## 2020-02-01 DIAGNOSIS — E785 Hyperlipidemia, unspecified: Secondary | ICD-10-CM

## 2020-02-01 DIAGNOSIS — E0800 Diabetes mellitus due to underlying condition with hyperosmolarity without nonketotic hyperglycemic-hyperosmolar coma (NKHHC): Secondary | ICD-10-CM | POA: Diagnosis not present

## 2020-02-01 DIAGNOSIS — Z0000 Encounter for general adult medical examination without abnormal findings: Secondary | ICD-10-CM

## 2020-02-01 DIAGNOSIS — Z124 Encounter for screening for malignant neoplasm of cervix: Secondary | ICD-10-CM

## 2020-02-01 DIAGNOSIS — E559 Vitamin D deficiency, unspecified: Secondary | ICD-10-CM

## 2020-02-01 NOTE — Patient Instructions (Signed)

## 2020-02-01 NOTE — Progress Notes (Signed)
Female Physical   Impression and Recommendations:    No diagnosis found.   1) Anticipatory Guidance: Discussed skin CA prevention and sunscreen when outside along with skin surveillance; eating a balanced and modest diet; physical activity at least 25 minutes per day or minimum of 150 min/ week moderate to intense activity.  2) Immunizations / Screenings / Labs:   All immunizations are up-to-date per recommendations or will be updated today if pt allows.    - Patient understands with dental and vision screens they will schedule independently.  - Will obtain CBC, CMP, HgA1c, Lipid panel, TSH and vit D when fasting, if not already done past 12 mo/ recently  -UTD on mammogram. -UTD on Pap smear, patient requesting one today. -UTD on colonoscopy. -UTD on Tdap. -Patient declined Shingrix. -Patient declined hep C and HIV screening.  3) Weight:  BMI meaning discussed with patient.  Discussed goal to improve diet habits to improve overall feelings of well being and objective health data. Improve nutrient density of diet through increasing intake of fruits and vegetables and decreasing saturated fats, white flour products and refined sugars. - BMI 20.56, WNL  4) Healthcare maintenance: -Continue current medication regimen. -Continue to follow-up with endocrinology for DM management. -Follow heart healthy diet and stay as active as possible. -Stay well-hydrated. -Follow-up in 6 months for regular office visit: HLD, DM, Mood  No orders of the defined types were placed in this encounter.    No orders of the defined types were placed in this encounter.    No follow-ups on file.   Reminded pt important of f-up preventative CPE in 1 year, this is in addition to any chronic care visits.    Gross side effects, risk and benefits, and alternatives of medications discussed with patient.  Patient is aware that all medications have potential side effects and we are unable to predict every  side effect or drug-drug interaction that may occur.  Expresses verbal understanding and consents to current therapy plan and treatment regimen.     Please see orders placed and AVS handed out to patient at the end of our visit for further patient instructions/ counseling done pertaining to today's office visit.    Subjective:     CPE HPI: Rebecca Robertson is a 64 y.o. female who presents to [place] [facility] today for a yearly health maintenance exam.   Health Maintenance Summary  - Reviewed and updated, unless pt declines services.  Last Cologuard or Colonoscopy:  04/20/2014, repeat in 10 years Family history of Colon CA: No Tobacco History Reviewed:  Y, never smoker CT scan for screening lung CA: N/A Alcohol and/or drug use: No concerns; no use Exercise Habits: Walking Dental Home: Y Eye exams: Y Dermatology home: Y  Female Health:  PAP Smear - last known results: 01/17/19, negative for lesions or malignancy and HPV not detected STD concerns: none  Lumps or breast concerns:  None, s/p left partial Mastectomy w/ SLN Bx and radiation therapy Breast Cancer Family History: No Bone/ DEXA scan:  N/A    Additional concerns beyond health maintenance issues:  none   Immunization History  Administered Date(s) Administered  . Influenza,inj,Quad PF,6+ Mos 06/09/2018  . Influenza-Unspecified 05/07/2016, 04/17/2017  . PPD Test 05/04/2016  . Tdap 08/18/2007, 12/09/2019     Health Maintenance  Topic Date Due  . Hepatitis C Screening  Never done  . PNEUMOCOCCAL POLYSACCHARIDE VACCINE AGE 39-64 HIGH RISK  Never done  . COVID-19 Vaccine (  1) Never done  . HIV Screening  Never done  . FOOT EXAM  03/10/2018  . HEMOGLOBIN A1C  01/17/2020  . URINE MICROALBUMIN  03/14/2020  . INFLUENZA VACCINE  03/17/2020  . OPHTHALMOLOGY EXAM  08/24/2020  . PAP SMEAR-Modifier  01/16/2022  . MAMMOGRAM  01/28/2022  . COLONOSCOPY  04/20/2024  . TETANUS/TDAP  12/08/2029      Wt Readings from Last 3 Encounters:  10/18/19 128 lb (58.1 kg)  07/19/19 121 lb (54.9 kg)  07/04/19 129 lb 8 oz (58.7 kg)   BP Readings from Last 3 Encounters:  12/09/19 106/67  10/18/19 104/67  07/19/19 118/74   Pulse Readings from Last 3 Encounters:  12/09/19 72  10/18/19 (!) 59  07/19/19 63     Past Medical History:  Diagnosis Date  . Anxiety   . Diabetes mellitus   . History of breast cancer ONCOLOGIST-- DR Humphrey Rolls--  NO RECURRENCE   S/P LEFT PARTIAL MASTECTOMY W/ SLN BX'S  12-02-2010---  DUCTAL CARCINOMA IN SITU--  S/P RADIATION THERAPY ENDED 02-17-2011  . Hyperlipidemia   . Personal history of radiation therapy 2012  . Pneumonia   . PONV (postoperative nausea and vomiting)   . SUI (stress urinary incontinence, female)   . Vitamin D deficiency   . Vitamin D insufficiency 03/04/2016      Past Surgical History:  Procedure Laterality Date  . BLADDER SUSPENSION N/A 02/03/2013   Procedure: [facility] PROCEDURE;  Surgeon: Reece Packer, MD;  Location: [facility];  Service: Urology;  Laterality: N/A;  . BREAST LUMPECTOMY Left 2012  . BUNIONECTOMY    . CESAREAN SECTION     X4  . LEFT HEART CATH AND CORONARY ANGIOGRAPHY N/A 01/26/2018   Procedure: LEFT HEART CATH AND CORONARY ANGIOGRAPHY;  Surgeon: Wellington Hampshire, MD;  Location: [place] [facility];  Service: Cardiovascular;  Laterality: N/A;  . MENISCUS REPAIR Left   . PARTIAL MASTECTOMY WITH AXILLARY SENTINEL LYMPH NODE BIOPSY Left 12-02-2010      Family History  Problem Relation Age of Onset  . Cancer Mother        lung  . Heart attack Father   . COPD Father   . Heart disease Father       Social History   Substance and Sexual Activity  Drug Use No  ,   Social History   Substance and Sexual Activity  Alcohol Use No  ,   Social History   Tobacco Use  Smoking Status Never Smoker  Smokeless Tobacco Never Used  ,   Social History   Substance and Sexual Activity  Sexual  Activity Not on file    Current Outpatient Medications on File Prior to Visit  Medication Sig Dispense Refill  . albuterol (VENTOLIN HFA) 108 (90 Base) MCG/ACT inhaler INHALE 2 PUFFS INTO THE LUNGS EVERY 6 HOURS AS NEEDED FOR WHEEZING OR SHORTNESS OF BREATH. 8.5 g 1  . BAYER CONTOUR NEXT TEST test strip Check blood sugar daily  5  . citalopram (CELEXA) 20 MG tablet Take 1 tablet (20 mg total) by mouth daily. 90 tablet 0  . clobetasol ointment (TEMOVATE) 1.02 % Apply 1 application topically 2 (two) times daily. 30 g 0  . fenofibrate (TRICOR) 145 MG tablet Take 1 tablet (145 mg total) by mouth daily. 90 tablet 0  . Fluocinolone Acetonide Body 0.01 % OIL Apply 1 application topically 2 (two) times daily as needed. 4 mL 2  . lovastatin (MEVACOR) 40 MG tablet TAKE 1  TABLET BY MOUTH ONCE DAILY AT  6  PM 90 tablet 0  . nystatin-triamcinolone ointment (MYCOLOG) Apply 1 application topically 2 (two) times daily as needed. 30 g 0  . oxybutynin (DITROPAN) 5 MG tablet Take 1 tablet by mouth twice daily 180 tablet 0  . Semaglutide (OZEMPIC) 0.25 or 0.5 MG/DOSE SOPN Inject 0.25 mg into the skin every Sunday.    Marland Kitchen SYNJARDY 12.12-998 MG TABS Take 1 tablet 2 (two) times daily by mouth.   4  . vitamin B-12 (CYANOCOBALAMIN) 1000 MCG tablet Take 1,000 mcg by mouth every other day.     Current Facility-Administered Medications on File Prior to Visit  Medication Dose Route Frequency Provider Last Rate Last Admin  . ipratropium-albuterol (DUONEB) 0.5-2.5 (3) MG/3ML nebulizer solution 3 mL  3 mL Nebulization Q6H Danford, Katy D, NP   3 mL at 06/21/18 6945    Allergies: Compazine and Bydureon [exenatide]  Review of Systems: General:   Denies fever, chills, unexplained weight loss.  Optho/Auditory:   Denies visual changes, blurred vision/LOV Respiratory:   Denies SOB, DOE more than baseline levels.   Cardiovascular:   Denies chest pain, palpitations, new onset peripheral edema  Gastrointestinal:   Denies nausea,  vomiting, diarrhea.  Genitourinary: Denies dysuria, freq/ urgency, flank pain  Endocrine:     Denies hot or cold intolerance, polyuria, polydipsia. Musculoskeletal:   Denies unexplained myalgias, joint swelling, unexplained arthralgias, gait problems.  Skin:  Denies rash, suspicious lesions Neurological:     Denies dizziness, unexplained weakness, numbness  Psychiatric/Behavioral:   Denies mood changes, suicidal or homicidal ideations, hallucinations    Objective:    There were no vitals taken for this visit. There is no height or weight on file to calculate BMI. General Appearance:    Alert, cooperative, no distress, appears stated age  Head:    Normocephalic, without obvious abnormality, atraumatic  Eyes:    PERRL, conjunctiva/corneas clear, EOM's intact, fundi    benign, both eyes  Ears:    Normal TM's and external ear canals, both ears  Nose:   Nares normal, septum midline, mucosa normal, no drainage    or sinus tenderness  Throat:   Lips w/o lesion, mucosa moist, and tongue normal; teeth and   gums fair condition  Neck:   Supple, symmetrical, trachea midline, no adenopathy;    thyroid:  no enlargement/tenderness/nodules; no carotid   bruit or JVD  Back:     Symmetric, no curvature, ROM normal  Lungs:     Clear to auscultation bilaterally, respirations unlabored, no    Wh/ R/ R  Chest Wall:    No tenderness or gross deformity; normal excursion   Heart:    Regular rate and rhythm, S1 and S2 normal, no murmur, rub   or gallop  Breast Exam:   Deferred by patient. Recent mammogram (01/29/20) negative  Abdomen:     Soft, non-tender, NBS, No G/R/R, no masses, no organomegaly  Genitalia:    Ext genitalia: without lesion, no rash or discharge, vulva atrophy, no tenderness; Cervix: WNL's w/o discharge or lesion; Adnexa: No tenderness or palpable masses, chaperone present  Rectal:   Deferred.  Extremities:   Extremities normal, atraumatic, no gross edema  Pulses:   Normal   Skin:    Warm, dry, Skin color, texture, turgor normal, no obvious rashes or lesions Psych: No HI/SI, judgement and insight good, Euthymic mood. Full Affect.  Neurologic:   CNII-XII grossly intact, normal strength, sensation and reflexes throughout

## 2020-02-02 LAB — LIPID PANEL
Chol/HDL Ratio: 4.2 ratio (ref 0.0–4.4)
Cholesterol, Total: 185 mg/dL (ref 100–199)
HDL: 44 mg/dL (ref >39)
LDL Chol Calc (NIH): 116 mg/dL — ABNORMAL HIGH (ref 0–99)
Triglycerides: 141 mg/dL (ref 0–149)
VLDL Cholesterol Cal: 25 mg/dL (ref 5–40)

## 2020-02-02 LAB — HEMOGLOBIN A1C
Est. average glucose Bld gHb Est-mCnc: 143 mg/dL
Hgb A1c MFr Bld: 6.6 % — ABNORMAL HIGH (ref 4.8–5.6)

## 2020-02-02 LAB — VITAMIN D 25 HYDROXY (VIT D DEFICIENCY, FRACTURES): Vit D, 25-Hydroxy: 34 ng/mL (ref 30.0–100.0)

## 2020-02-02 LAB — VITAMIN B12: Vitamin B-12: 587 pg/mL (ref 232–1245)

## 2020-02-02 LAB — MICROALBUMIN / CREATININE URINE RATIO
Creatinine, Urine: 102.3 mg/dL
Microalb/Creat Ratio: 10 mg/g creat (ref 0–29)
Microalbumin, Urine: 10 ug/mL

## 2020-02-05 LAB — CYTOLOGY - PAP
Comment: NEGATIVE
Diagnosis: NEGATIVE
High risk HPV: NEGATIVE

## 2020-02-15 ENCOUNTER — Other Ambulatory Visit: Payer: Self-pay | Admitting: Family Medicine

## 2020-02-21 ENCOUNTER — Other Ambulatory Visit: Payer: Self-pay | Admitting: Physician Assistant

## 2020-02-21 MED ORDER — OXYBUTYNIN CHLORIDE 5 MG PO TABS: 5.0000 mg | ORAL_TABLET | Freq: Two times a day (BID) | ORAL | 0 refills | Status: DC

## 2020-04-24 ENCOUNTER — Other Ambulatory Visit: Payer: Self-pay | Admitting: Adult Health

## 2020-04-24 ENCOUNTER — Other Ambulatory Visit: Payer: Self-pay | Admitting: Physician Assistant

## 2020-04-25 ENCOUNTER — Telehealth: Payer: Self-pay | Admitting: Physician Assistant

## 2020-04-25 MED ORDER — NYSTATIN-TRIAMCINOLONE 100000-0.1 UNIT/GM-% EX OINT: 1.0000 "application " | TOPICAL_OINTMENT | Freq: Two times a day (BID) | CUTANEOUS | 0 refills | Status: DC | PRN

## 2020-04-25 NOTE — Telephone Encounter (Signed)
Patient is requesting a refill of her nystatin ointment, if approved please send to [facility] on [address]

## 2020-04-25 NOTE — Addendum Note (Signed)
Addended by: Mickel Crow on: 04/25/2020 01:30 PM   Modules accepted: Orders

## 2020-04-25 NOTE — Telephone Encounter (Signed)
Refill sent to pharmacy. AS, CMA 

## 2020-06-04 ENCOUNTER — Other Ambulatory Visit: Payer: Self-pay | Admitting: Physician Assistant

## 2020-06-05 IMAGING — CT CT ABD-PELV W/ CM
1 of 2 series · 15 of 32 positions shown, 19 images · IV contrast (APPLIED)
Comparison: 05/17/2014

CLINICAL DATA: Generalized abdominal pain for 1 month. Diarrhea.
Personal history of left breast carcinoma.

EXAM:
CT ABDOMEN AND PELVIS WITH CONTRAST
TECHNIQUE: Multidetector CT imaging of the abdomen and pelvis was performed
using the standard protocol following bolus administration of
intravenous contrast.
CONTRAST:  100mL 2AFU28-ARR IOPAMIDOL (2AFU28-ARR) INJECTION 61%

[Series 2: abd/pelvis w/cm · axial · 0.67mm/px · z∈[+481,+856]mm · 15 of 83 slices shown, 19 images]
[im 4/83  soft-tissue]
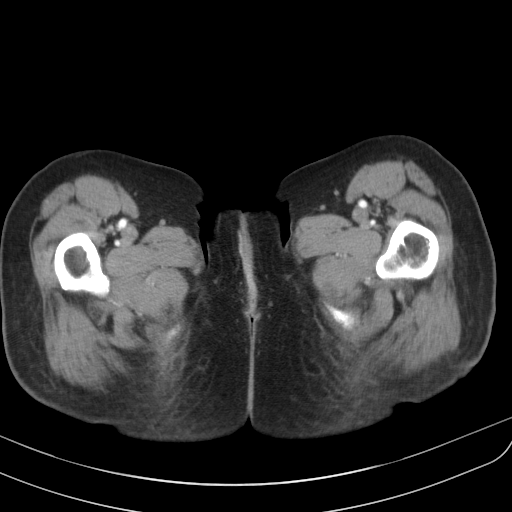
[im 4/83  bone]
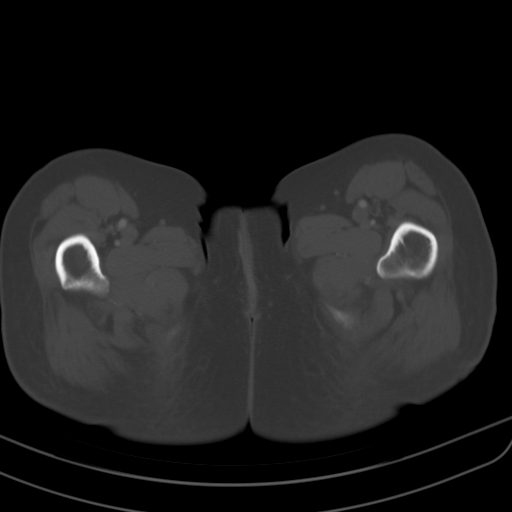
[im 12/83  soft-tissue]
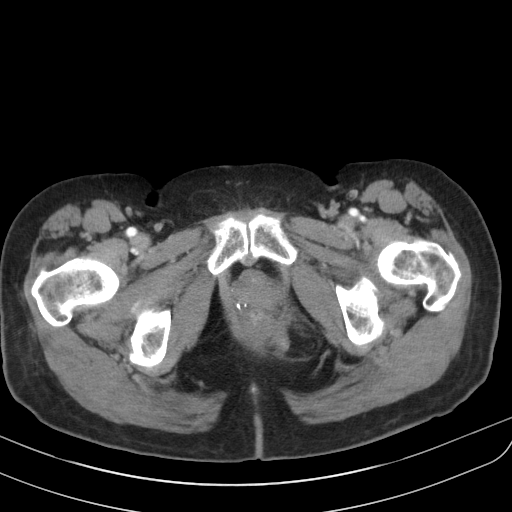
[im 19/83  soft-tissue]
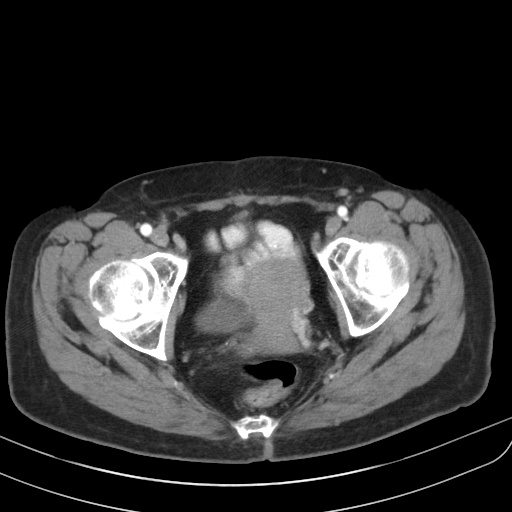
[im 23/83  soft-tissue]
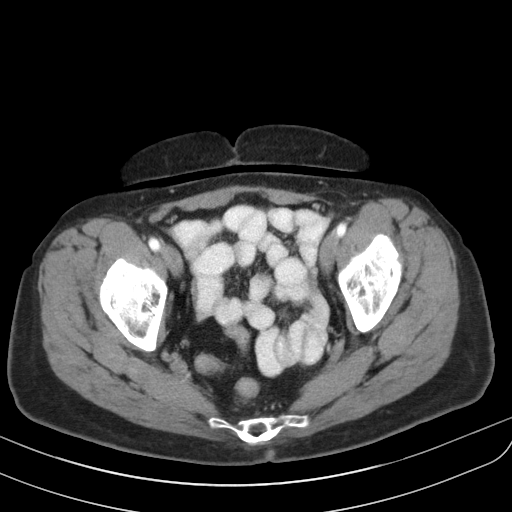
[im 30/83  soft-tissue]
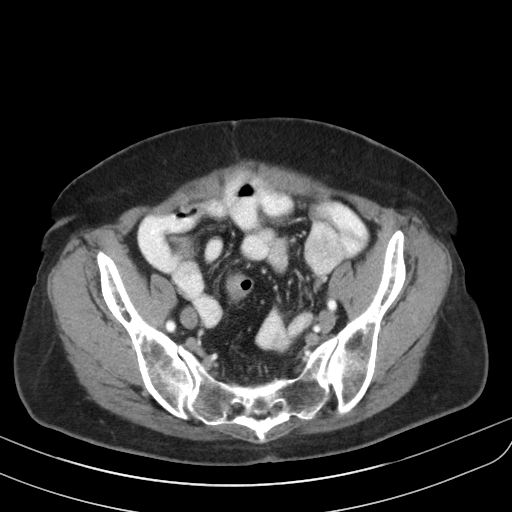
[im 34/83  soft-tissue]
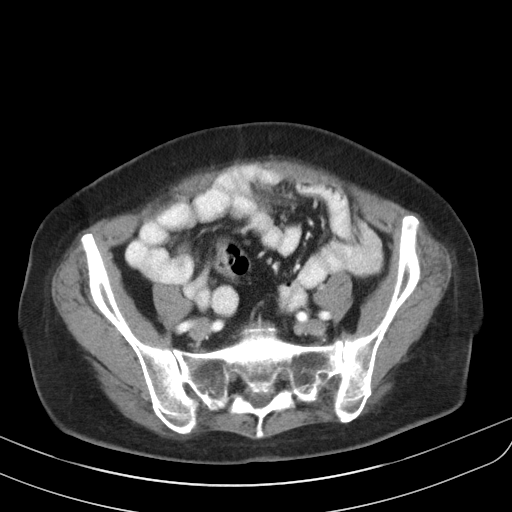
[im 42/83  soft-tissue]
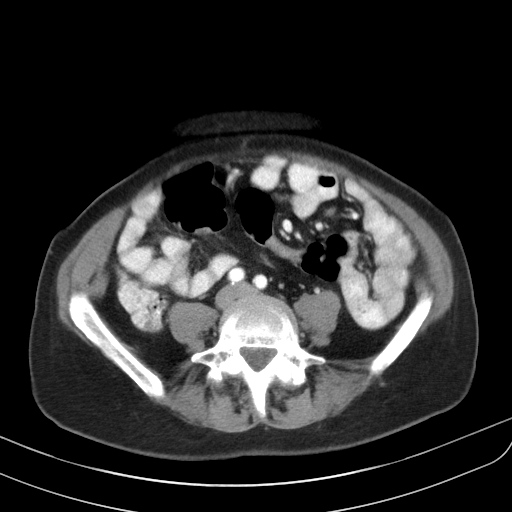
[im 49/83  soft-tissue]
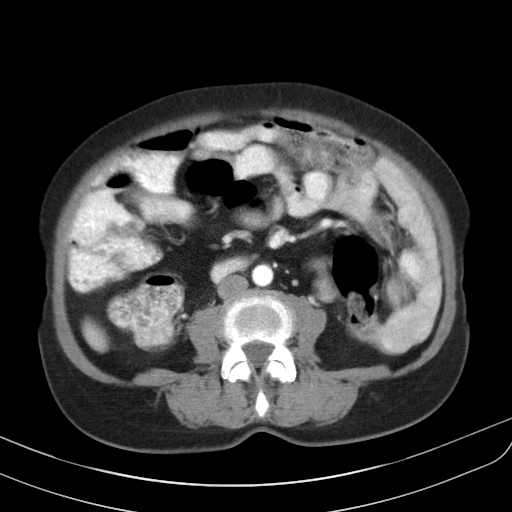
[im 53/83  soft-tissue]
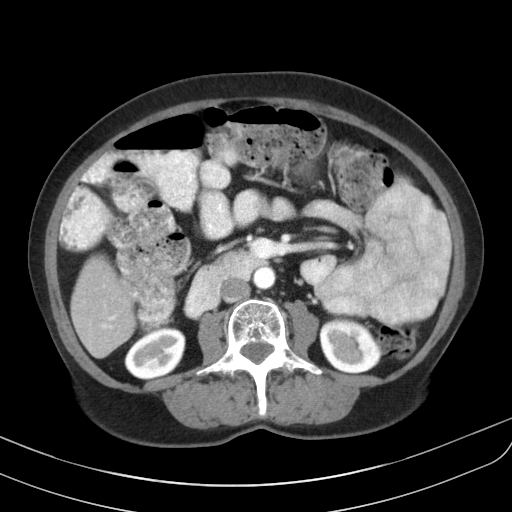
[im 53/83  bone]
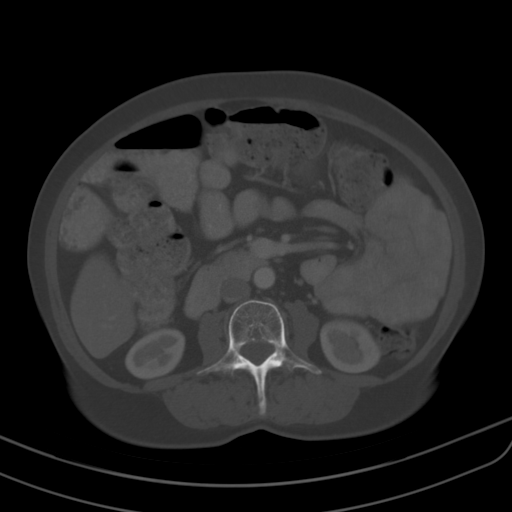
[im 60/83  soft-tissue]
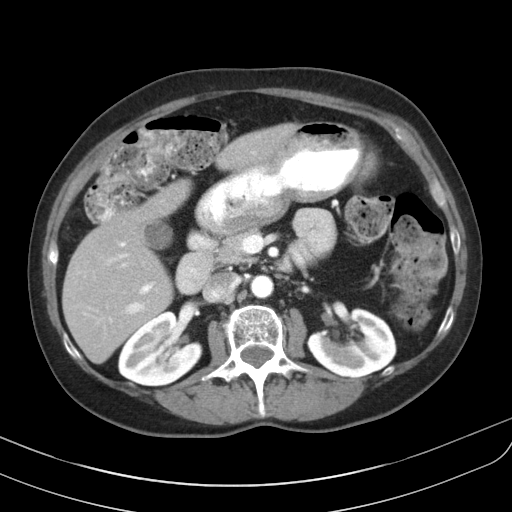
[im 64/83  soft-tissue]
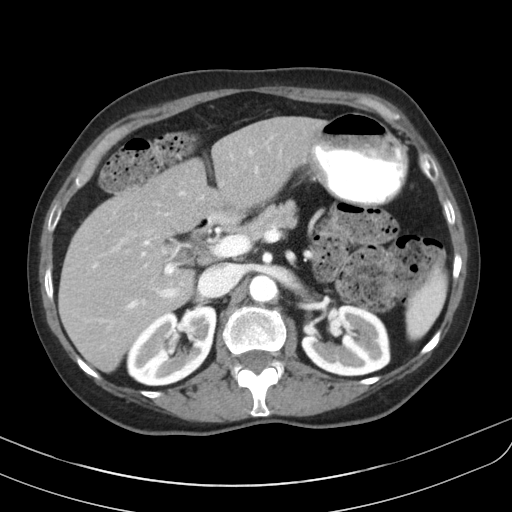
[im 68/83  lung]
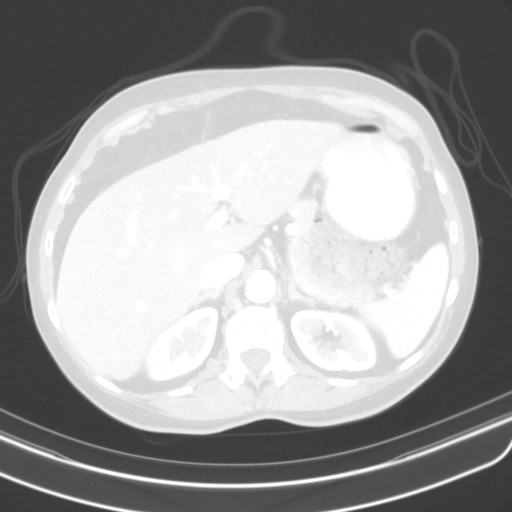
[im 71/83  soft-tissue]
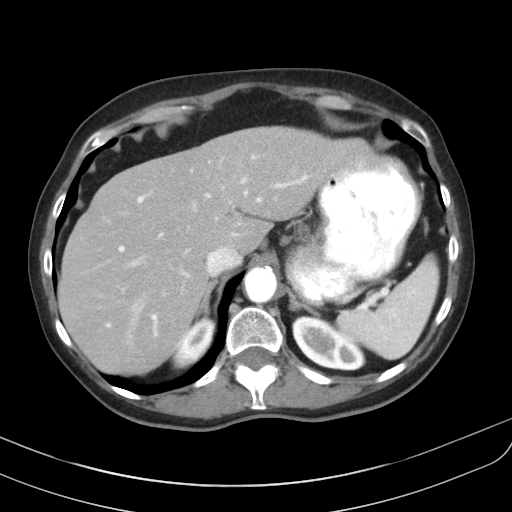
[im 71/83  lung]
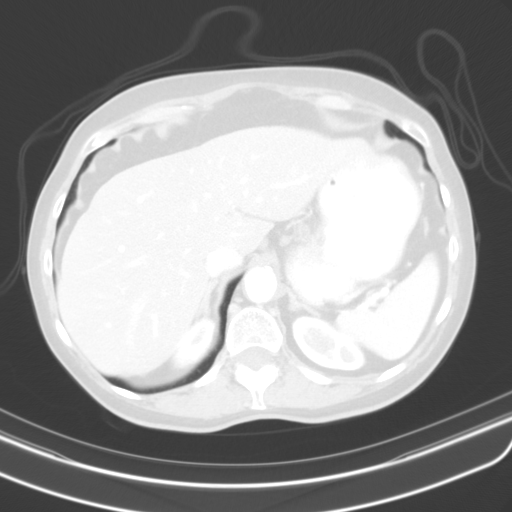
[im 75/83  lung]
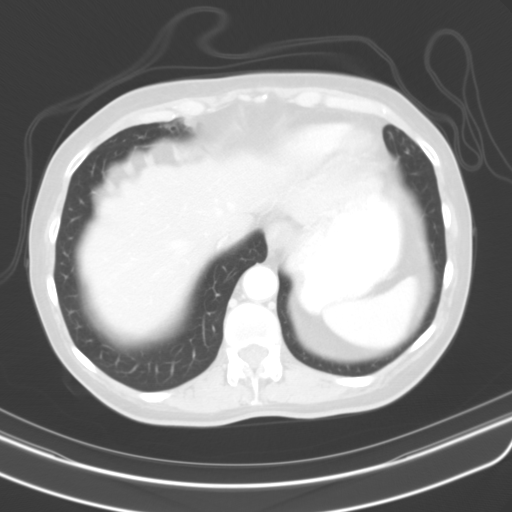
[im 79/83  soft-tissue]
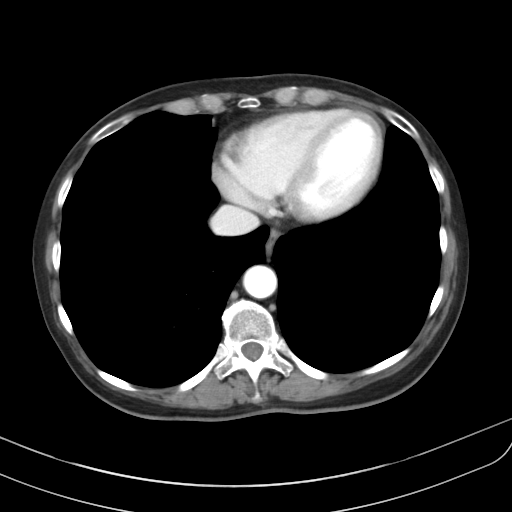
[im 79/83  lung]
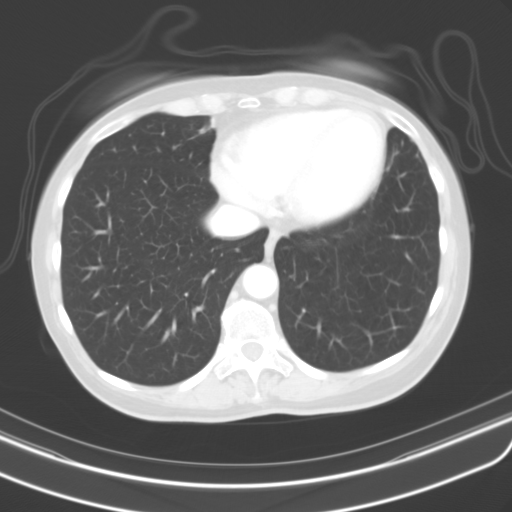

[15 of 32 positions shown; findings below may reference images not displayed]

FINDINGS: Lower Chest: No acute findings.

Hepatobiliary: No hepatic masses identified. Gallbladder is
unremarkable. No evidence of biliary ductal dilatation.

Pancreas:  No mass or inflammatory changes.

Spleen: Within normal limits in size and appearance.

Adrenals/Urinary Tract: No masses identified. Stable small right
renal cyst. No evidence of ureteral calculi or hydronephrosis.

Stomach/Bowel: No evidence of obstruction, inflammatory process or
abnormal fluid collections. Large stool burden again seen throughout
the colon, similar to prior exam.

Vascular/Lymphatic: No pathologically enlarged lymph nodes. No
abdominal aortic aneurysm.

Reproductive:  No mass or other significant abnormality.

Other:  None.

Musculoskeletal:  No suspicious bone lesions identified.
IMPRESSION: 1. No acute findings within the abdomen or pelvis.
2. Large stool burden noted; recommend clinical correlation for
possible constipation.

## 2020-07-22 ENCOUNTER — Other Ambulatory Visit: Payer: Self-pay | Admitting: Physician Assistant

## 2020-08-01 ENCOUNTER — Other Ambulatory Visit: Payer: Self-pay | Admitting: Adult Health

## 2020-08-01 ENCOUNTER — Ambulatory Visit: Payer: Managed Care, Other (non HMO) | Admitting: Physician Assistant

## 2020-08-06 ENCOUNTER — Telehealth: Payer: Self-pay | Admitting: Physician Assistant

## 2020-08-06 ENCOUNTER — Other Ambulatory Visit: Payer: Self-pay | Admitting: Adult Health

## 2020-08-06 DIAGNOSIS — E1121 Type 2 diabetes mellitus with diabetic nephropathy: Secondary | ICD-10-CM | POA: Insufficient documentation

## 2020-08-06 MED ORDER — LOVASTATIN 40 MG PO TABS: ORAL_TABLET | ORAL | 0 refills | Status: DC

## 2020-08-06 NOTE — Progress Notes (Signed)
Established Patient Office Visit  Subjective:  Patient ID: Rebecca Robertson, female    DOB: March 28, 1956  Age: 64 y.o. MRN: [id number]  CC:  Chief Complaint  Patient presents with  . Diabetes  . Hyperlipidemia    HPI [name] presents for medication management  Diabetes: followed by Dr. Buddy Duty once yearly. Taking Ozempic and Synjardy. She is also on Lovastatin 40 mg for dyslipidemia. Denies adverse effects of medication Compliant with medications.  Denies polydipsia, polyuria, polyphagia. Denies blurred vision or vision change. Denies extremity pain, altered sensation and paresthesias.  Denies ulcers/wounds on feet. Hx of DKA/HHS: never Blood glucose readings: n/a Severe hypoglycemia (requiring 3rd party assistance): never    B12 deficiency: takes 1000 mcg every other day. Reports she only eats about 1 meal per day and several snacks. She reports loss of appetite as she has gotten older. Weight has stayed stable at 116-117lb. Denies fatigue, weakness, paresthesias.  History of breast cancer: left-sided s/p lumpectomy and radiation. reports she has been cancer free x 9 years. Mammogram UTD June 2021.  Past Medical History:  Diagnosis Date  . Anxiety   . Diabetes mellitus   . History of breast cancer ONCOLOGIST-- DR Humphrey Rolls--  NO RECURRENCE   S/P LEFT PARTIAL MASTECTOMY W/ SLN BX'S  12-02-2010---  DUCTAL CARCINOMA IN SITU--  S/P RADIATION THERAPY ENDED 02-17-2011  . Hyperlipidemia   . Personal history of radiation therapy 2012  . Pneumonia   . PONV (postoperative nausea and vomiting)   . SUI (stress urinary incontinence, female)   . Vitamin D deficiency   . Vitamin D insufficiency 03/04/2016    Past Surgical History:  Procedure Laterality Date  . BLADDER SUSPENSION N/A 02/03/2013   Procedure: [facility] PROCEDURE;  Surgeon: Reece Packer, MD;  Location: [facility];  Service: Urology;  Laterality: N/A;  . BREAST LUMPECTOMY Left 2012  .  BUNIONECTOMY    . CESAREAN SECTION     X4  . LEFT HEART CATH AND CORONARY ANGIOGRAPHY N/A 01/26/2018   Procedure: LEFT HEART CATH AND CORONARY ANGIOGRAPHY;  Surgeon: Wellington Hampshire, MD;  Location: [place] [facility];  Service: Cardiovascular;  Laterality: N/A;  . MENISCUS REPAIR Left   . PARTIAL MASTECTOMY WITH AXILLARY SENTINEL LYMPH NODE BIOPSY Left 12-02-2010    Family History  Problem Relation Age of Onset  . Cancer Mother        lung  . Heart attack Father   . COPD Father   . Heart disease Father     Social History   Socioeconomic History  . Marital status: Married    Spouse name: Not on file  . Number of children: Not on file  . Years of education: Not on file  . Highest education level: Not on file  Occupational History  . Not on file  Tobacco Use  . Smoking status: Never Smoker  . Smokeless tobacco: Never Used  Vaping Use  . Vaping Use: Never used  Substance and Sexual Activity  . Alcohol use: No  . Drug use: No  . Sexual activity: Not on file  Other Topics Concern  . Not on file  Social History Narrative  . Not on file   Social Determinants of Health   Financial Resource Strain: Not on file  Food Insecurity: Not on file  Transportation Needs: Not on file  Physical Activity: Not on file  Stress: Not on file  Social Connections: Not on file  Intimate Partner Violence: Not on  file    Outpatient Medications Prior to Visit  Medication Sig Dispense Refill  . albuterol (VENTOLIN HFA) 108 (90 Base) MCG/ACT inhaler INHALE 2 PUFFS INTO THE LUNGS EVERY 6 HOURS AS NEEDED FOR WHEEZING OR SHORTNESS OF BREATH. 8.5 g 1  . BAYER CONTOUR NEXT TEST test strip Check blood sugar daily  5  . citalopram (CELEXA) 20 MG tablet Take 1 tablet by mouth once daily 90 tablet 0  . Fluocinolone Acetonide Body 0.01 % OIL Apply 1 application topically 2 (two) times daily as needed. 4 mL 2  . lovastatin (MEVACOR) 40 MG tablet TAKE 1 TABLET BY MOUTH ONCE DAILY AT  6  PM 90 tablet 0   . nystatin-triamcinolone ointment (MYCOLOG) Apply 1 application topically 2 (two) times daily as needed. 30 g 0  . oxybutynin (DITROPAN) 5 MG tablet Take 1 tablet by mouth twice daily 180 tablet 0  . Semaglutide,0.25 or 0.5MG/DOS, 2 MG/1.5ML SOPN Inject 0.25 mg into the skin every Sunday.    Marland Kitchen SYNJARDY 12.12-998 MG TABS Take 1 tablet 2 (two) times daily by mouth.   4  . vitamin B-12 (CYANOCOBALAMIN) 1000 MCG tablet Take 1,000 mcg by mouth every other day.    . clobetasol ointment (TEMOVATE) 0.93 % Apply 1 application topically 2 (two) times daily. 30 g 0  . fenofibrate (TRICOR) 145 MG tablet Take 1 tablet (145 mg total) by mouth daily. 90 tablet 0  . ipratropium-albuterol (DUONEB) 0.5-2.5 (3) MG/3ML nebulizer solution 3 mL      No facility-administered medications prior to visit.    Allergies  Allergen Reactions  . Compazine Other (See Comments)    Possible seizure, unable to talk, eyes rolled toward back of head, mouth became dry.  [name] [Exenatide]     Severe burning with injection    ROS Review of Systems  Constitutional: Positive for appetite change (decreased, chronic).  Gastrointestinal: Positive for constipation (chronic). Negative for abdominal pain and blood in stool.  Psychiatric/Behavioral: The patient is nervous/anxious.   All other systems reviewed and are negative.     Objective:    Physical Exam Constitutional:      Appearance: She is not ill-appearing.  HENT:     Head: Normocephalic and atraumatic.  Cardiovascular:     Rate and Rhythm: Normal rate and regular rhythm.     Heart sounds: Normal heart sounds. No murmur heard.   Pulmonary:     Effort: Pulmonary effort is normal.     Breath sounds: Normal breath sounds.  Abdominal:     General: Abdomen is flat.     Palpations: Abdomen is soft.     Tenderness: There is no abdominal tenderness.  Musculoskeletal:     Right lower leg: No edema.     Left lower leg: No edema.  Skin:    General: Skin is  warm and dry.  Neurological:     Mental Status: She is alert and oriented to person, place, and time.  Psychiatric:        Mood and Affect: Mood normal.        Behavior: Behavior normal.        Thought Content: Thought content normal.        Judgment: Judgment normal.     BP 120/74   Pulse 62   Temp 98 F (36.7 C) (Oral)   Ht _0  (1.626 m)   Wt 122 lb 9.6 oz (55.6 kg)   SpO2 99% Comment: on RA  BMI 21.04 kg/m  Wt Readings from Last 3 Encounters:  08/07/20 122 lb 9.6 oz (55.6 kg)  02/01/20 119 lb 12.8 oz (54.3 kg)  10/18/19 128 lb (58.1 kg)     Health Maintenance Due  Topic Date Due  . INFLUENZA VACCINE  03/17/2020    There are no preventive care reminders to display for this patient.  Lab Results  Component Value Date   TSH 1.53 12/26/2019   Lab Results  Component Value Date   WBC 8.3 12/26/2019   HGB 13.9 12/26/2019   HCT 41 12/26/2019   MCV 89 03/27/2019   PLT 242 12/26/2019   Lab Results  Component Value Date   NA 142 12/26/2019   K 4.3 12/26/2019   CHLORIDE 108 09/10/2015   CO2 30 (A) 12/26/2019   GLUCOSE 111 (H) 07/19/2019   BUN 15 12/26/2019   CREATININE 0.6 12/26/2019   BILITOT 0.3 07/19/2019   ALKPHOS 67 12/26/2019   AST 11 (A) 12/26/2019   ALT 10 12/26/2019   PROT 7.0 07/19/2019   ALBUMIN 4.3 12/26/2019   CALCIUM 9.9 12/26/2019   ANIONGAP 11 12/06/2017   EGFR 85 (L) 09/10/2015   Lab Results  Component Value Date   CHOL 185 02/01/2020   Lab Results  Component Value Date   HDL 44 02/01/2020   Lab Results  Component Value Date   LDLCALC 116 (H) 02/01/2020   Lab Results  Component Value Date   TRIG 141 02/01/2020   Lab Results  Component Value Date   CHOLHDL 4.2 02/01/2020   Lab Results  Component Value Date   HGBA1C 6.1 (A) 08/07/2020      Assessment & Plan:   Problem List Items Addressed This Visit      Endocrine   Microalbuminuric diabetic nephropathy ([place])   Relevant Orders   Comprehensive metabolic panel    Diabetes ([place]) (Chronic)   Relevant Orders   Vitamin B12   POCT HgB A1C (Completed)   Comprehensive metabolic panel   CBC with Differential/Platelet     Other   B12 deficiency - Primary (Chronic)   Relevant Orders   Vitamin B12   CBC with Differential/Platelet     Type 2 Diabetes with microalbuminuria Lab Results  Component Value Date   HGBA1C 6.1 (A) 08/07/2020  A1C at goal <7.0, cont current meds BP at goal <130/80 LDL goal <70 - discussed with patient - she does not wish to increase Lovastatin Urine microalbumin UTD Diabetic foot exam: UTD Diabetic eye exam: UTD Keep yearly follow-up with Endocrinology CMP pending  History of B12 deficiency B12 level and CBC pending Counseled on nutrition - she does not eat 3 meals per day due to lack of appetite. Encouraged to eat nutrient dense mini-mills with protein/fat/carb several times per day. We discussed that Ozempic is also likely suppressing appetite  Reviewed health maintenance Cancer screening and diabetes preventive care UTD She is deferring influenza vaccine due to recent COVID booster  No orders of the defined types were placed in this encounter.   Follow-up: Return in about 6 months (around 02/05/2021) for 30 mins - Pap smear and labs.    Trixie Dredge, [place]

## 2020-08-06 NOTE — Telephone Encounter (Signed)
Patient needs a refill on lovastatin, 90 day supply and uses [facility] on [address] [place].

## 2020-08-06 NOTE — Telephone Encounter (Signed)
Patient has apt tomorrow 08/07/20 at 815am with Evlyn Clines. AS, CMA

## 2020-08-06 NOTE — Addendum Note (Signed)
Addended by: Mickel Crow on: 08/06/2020 04:41 PM   Modules accepted: Orders

## 2020-08-07 ENCOUNTER — Encounter: Payer: Self-pay | Admitting: Physician Assistant

## 2020-08-07 ENCOUNTER — Other Ambulatory Visit: Payer: Self-pay

## 2020-08-07 ENCOUNTER — Ambulatory Visit (INDEPENDENT_AMBULATORY_CARE_PROVIDER_SITE_OTHER): Payer: BC Managed Care – PPO | Admitting: Physician Assistant

## 2020-08-07 VITALS — BP 120/74 | HR 62 | Temp 98.0°F | Ht 64.0 in | Wt 122.6 lb

## 2020-08-07 DIAGNOSIS — E1121 Type 2 diabetes mellitus with diabetic nephropathy: Secondary | ICD-10-CM | POA: Diagnosis not present

## 2020-08-07 DIAGNOSIS — Z79899 Other long term (current) drug therapy: Secondary | ICD-10-CM | POA: Diagnosis not present

## 2020-08-07 DIAGNOSIS — E0800 Diabetes mellitus due to underlying condition with hyperosmolarity without nonketotic hyperglycemic-hyperosmolar coma (NKHHC): Secondary | ICD-10-CM

## 2020-08-07 DIAGNOSIS — E538 Deficiency of other specified B group vitamins: Secondary | ICD-10-CM | POA: Diagnosis not present

## 2020-08-07 LAB — POCT GLYCOSYLATED HEMOGLOBIN (HGB A1C): Hemoglobin A1C: 6.1 % — AB (ref 4.0–5.6)

## 2020-08-08 LAB — COMPREHENSIVE METABOLIC PANEL
ALT: 17 IU/L (ref 0–32)
AST: 17 IU/L (ref 0–40)
Albumin/Globulin Ratio: 1.4 (ref 1.2–2.2)
Albumin: 4.3 g/dL (ref 3.8–4.8)
Alkaline Phosphatase: 111 IU/L (ref 44–121)
BUN/Creatinine Ratio: 15 (ref 12–28)
BUN: 8 mg/dL (ref 8–27)
Bilirubin Total: 0.4 mg/dL (ref 0.0–1.2)
CO2: 26 mmol/L (ref 20–29)
Calcium: 9.8 mg/dL (ref 8.7–10.3)
Chloride: 101 mmol/L (ref 96–106)
Creatinine, Ser: 0.53 mg/dL — ABNORMAL LOW (ref 0.57–1.00)
GFR calc Af Amer: 117 mL/min/{1.73_m2} (ref >59)
GFR calc non Af Amer: 101 mL/min/{1.73_m2} (ref >59)
Globulin, Total: 3 g/dL (ref 1.5–4.5)
Glucose: 124 mg/dL — ABNORMAL HIGH (ref 65–99)
Potassium: 4.7 mmol/L (ref 3.5–5.2)
Sodium: 139 mmol/L (ref 134–144)
Total Protein: 7.3 g/dL (ref 6.0–8.5)

## 2020-08-08 LAB — CBC WITH DIFFERENTIAL/PLATELET
Basophils Absolute: 0 10*3/uL (ref 0.0–0.2)
Basos: 1 %
EOS (ABSOLUTE): 0.1 10*3/uL (ref 0.0–0.4)
Eos: 2 %
Hematocrit: 42.1 % (ref 34.0–46.6)
Hemoglobin: 14 g/dL (ref 11.1–15.9)
Immature Grans (Abs): 0 10*3/uL (ref 0.0–0.1)
Immature Granulocytes: 0 %
Lymphocytes Absolute: 1.6 10*3/uL (ref 0.7–3.1)
Lymphs: 33 %
MCH: 29.6 pg (ref 26.6–33.0)
MCHC: 33.3 g/dL (ref 31.5–35.7)
MCV: 89 fL (ref 79–97)
Monocytes Absolute: 0.6 10*3/uL (ref 0.1–0.9)
Monocytes: 13 %
Neutrophils Absolute: 2.5 10*3/uL (ref 1.4–7.0)
Neutrophils: 51 %
Platelets: 265 10*3/uL (ref 150–450)
RBC: 4.73 x10E6/uL (ref 3.77–5.28)
RDW: 12.3 % (ref 11.7–15.4)
WBC: 4.9 10*3/uL (ref 3.4–10.8)

## 2020-08-08 LAB — VITAMIN B12: Vitamin B-12: 678 pg/mL (ref 232–1245)

## 2020-08-09 ENCOUNTER — Ambulatory Visit
Admission: EM | Admit: 2020-08-09 | Discharge: 2020-08-09 | Disposition: A | Discharge status: Home / Self Care | Payer: BC Managed Care – PPO | Attending: Emergency Medicine | Admitting: Emergency Medicine

## 2020-08-09 ENCOUNTER — Ambulatory Visit (INDEPENDENT_AMBULATORY_CARE_PROVIDER_SITE_OTHER): Payer: BC Managed Care – PPO

## 2020-08-09 ENCOUNTER — Encounter: Payer: Self-pay | Admitting: Emergency Medicine

## 2020-08-09 ENCOUNTER — Other Ambulatory Visit: Payer: Self-pay

## 2020-08-09 DIAGNOSIS — M79621 Pain in right upper arm: Secondary | ICD-10-CM | POA: Diagnosis not present

## 2020-08-09 DIAGNOSIS — M79601 Pain in right arm: Secondary | ICD-10-CM | POA: Diagnosis not present

## 2020-08-09 DIAGNOSIS — W19XXXA Unspecified fall, initial encounter: Secondary | ICD-10-CM | POA: Diagnosis not present

## 2020-08-09 NOTE — ED Provider Notes (Signed)
[facility]    CSN: [id number] Arrival date & time: 08/09/20  1234      History   Chief Complaint Chief Complaint  Patient presents with  . Arm Pain    HPI Rebecca Robertson is a 64 y.o. female presenting today for evaluation of right arm pain.  Patient reports had received Covid booster Monday.  The following day she fell getting out of bed and is unsure exactly how she injured her arm but since has had worsening pain to her right upper arm.  Has pain with moving shoulder but majority of pain extends from proximal upper arm throughout humerus area.  Denies any elbow pain or distal pain to elbow.   HPI  Past Medical History:  Diagnosis Date  . Anxiety   . Diabetes mellitus   . History of breast cancer ONCOLOGIST-- DR Humphrey Rolls--  NO RECURRENCE   S/P LEFT PARTIAL MASTECTOMY W/ SLN BX'S  12-02-2010---  DUCTAL CARCINOMA IN SITU--  S/P RADIATION THERAPY ENDED 02-17-2011  . Hyperlipidemia   . Personal history of radiation therapy 2012  . Pneumonia   . PONV (postoperative nausea and vomiting)   . SUI (stress urinary incontinence, female)   . Vitamin D deficiency   . Vitamin D insufficiency 03/04/2016    Patient Active Problem List   Diagnosis Date Noted  . Microalbuminuric diabetic nephropathy ([place]) 08/06/2020  . Fatigue 03/27/2019  . Wheezing 09/26/2018  . Cough in adult 06/21/2018  . Vaginal yeast infection 03/28/2018  . Abnormal nuclear stress test   . Chest pain 12/07/2017  . Healthcare maintenance 11/11/2017  . Screening for cervical cancer 11/11/2017  . Personal history UTI- 04/07/17, + Cx- txed with abx 05/18/2017  . Urinary frequency 05/18/2017  . Diarrhea in adult patient 05/11/2017  . UTI (urinary tract infection) 04/07/2017  . Dysuria 04/07/2017  . Abnormal urinalysis 04/07/2017  . Cervical smear, as part of routine gynecological examination 11/09/2016  . Depression 05/28/2016  . B12 deficiency 05/28/2016  . SUI (stress urinary incontinence,  female) 05/28/2016  . Hypertriglyceridemia 05/28/2016  . Vaginal atrophy 03/25/2016  . h/o Anxiety 03/08/2016  . Hyperlipidemia 03/04/2016  . Vitamin D insufficiency 03/04/2016  . History of breast cancer 03/04/2016  . Contact dermatitis and eczema due to cause 03/04/2016  . Closed fracture of proximal phalanx of toe 04/03/2014  . Diabetes ([place]) 03/27/2014  . Neoplasm of left breast, primary tumor staging category Tis: ductal carcinoma in situ (DCIS) 02/10/2011    Past Surgical History:  Procedure Laterality Date  . BLADDER SUSPENSION N/A 02/03/2013   Procedure: [facility] PROCEDURE;  Surgeon: Reece Packer, MD;  Location: [facility];  Service: Urology;  Laterality: N/A;  . BREAST LUMPECTOMY Left 2012  . BUNIONECTOMY    . CESAREAN SECTION     X4  . LEFT HEART CATH AND CORONARY ANGIOGRAPHY N/A 01/26/2018   Procedure: LEFT HEART CATH AND CORONARY ANGIOGRAPHY;  Surgeon: Wellington Hampshire, MD;  Location: [place] [facility];  Service: Cardiovascular;  Laterality: N/A;  . MENISCUS REPAIR Left   . PARTIAL MASTECTOMY WITH AXILLARY SENTINEL LYMPH NODE BIOPSY Left 12-02-2010    OB History   No obstetric history on file.      Home Medications    Prior to Admission medications   Medication Sig Start Date End Date Taking? Authorizing Provider  albuterol (VENTOLIN HFA) 108 (90 Base) MCG/ACT inhaler INHALE 2 PUFFS INTO THE LUNGS EVERY 6 HOURS AS NEEDED FOR WHEEZING OR SHORTNESS OF BREATH.  01/08/20   Lorrene Reid, PA-C  BAYER CONTOUR NEXT TEST test strip Check blood sugar daily 01/18/16   [provider]  citalopram (CELEXA) 20 MG tablet Take 1 tablet by mouth once daily 07/22/20   Abonza, Maritza, PA-C  Fluocinolone Acetonide Body 0.01 % OIL Apply 1 application topically 2 (two) times daily as needed. 02/02/18   Danford, Valetta Fuller D, NP  lovastatin (MEVACOR) 40 MG tablet TAKE 1 TABLET BY MOUTH ONCE DAILY AT  6  PM 08/06/20   Lorrene Reid, PA-C  nystatin-triamcinolone  ointment (MYCOLOG) Apply 1 application topically 2 (two) times daily as needed. 04/25/20   Lorrene Reid, PA-C  oxybutynin (DITROPAN) 5 MG tablet Take 1 tablet by mouth twice daily 06/04/20   Lorrene Reid, PA-C  Semaglutide,0.25 or 0.5MG /DOS, 2 MG/1.5ML SOPN Inject 0.25 mg into the skin every Sunday.    [provider]  SYNJARDY 12.12-998 MG TABS Take 1 tablet 2 (two) times daily by mouth.  08/26/15   [provider]  vitamin B-12 (CYANOCOBALAMIN) 1000 MCG tablet Take 1,000 mcg by mouth every other day.    [provider]    Family History Family History  Problem Relation Age of Onset  . Cancer Mother        lung  . Heart attack Father   . COPD Father   . Heart disease Father     Social History Social History   Tobacco Use  . Smoking status: Never Smoker  . Smokeless tobacco: Never Used  Vaping Use  . Vaping Use: Never used  Substance Use Topics  . Alcohol use: No  . Drug use: No     Allergies   Compazine, Dulaglutide, Sitagliptin-metformin hcl, Tamoxifen citrate, Aspirin, Atorvastatin, Bydureon [exenatide], and Liraglutide   Review of Systems Review of Systems  Constitutional: Negative for fatigue and fever.  HENT: Negative for mouth sores.   Eyes: Negative for visual disturbance.  Respiratory: Negative for shortness of breath.   Cardiovascular: Negative for chest pain.  Gastrointestinal: Negative for abdominal pain, nausea and vomiting.  Musculoskeletal: Positive for arthralgias. Negative for joint swelling.  Skin: Negative for color change, rash and wound.  Neurological: Negative for dizziness, weakness, light-headedness and headaches.     Physical Exam Triage Vital Signs ED Triage Vitals  Enc Vitals Group     BP 08/09/20 1322 (!) 150/74     Pulse Rate 08/09/20 1322 68     Resp 08/09/20 1322 18     Temp 08/09/20 1322 97.8 F (36.6 C)     Temp Source 08/09/20 1322 Oral     SpO2 08/09/20 1322 97 %     Weight --      Height --       Head Circumference --      Peak Flow --      Pain Score 08/09/20 1328 6     Pain Loc --      Pain Edu? --      Excl. in [place]? --    No data found.  Updated Vital Signs BP (!) 150/74 (BP Location: Left Arm)   Pulse 68   Temp 97.8 F (36.6 C) (Oral)   Resp 18   SpO2 97%   Visual Acuity Right Eye Distance:   Left Eye Distance:   Bilateral Distance:    Right Eye Near:   Left Eye Near:    Bilateral Near:     Physical Exam Vitals and nursing note reviewed.  Constitutional:  Appearance: She is well-developed and well-nourished.     Comments: No acute distress  HENT:     Head: Normocephalic and atraumatic.     Nose: Nose normal.  Eyes:     Conjunctiva/sclera: Conjunctivae normal.  Cardiovascular:     Rate and Rhythm: Normal rate.  Pulmonary:     Effort: Pulmonary effort is normal. No respiratory distress.  Abdominal:     General: There is no distension.  Musculoskeletal:        General: Normal range of motion.     Cervical back: Neck supple.     Comments: Right arm: Full active range of motion of right shoulder elbow and wrist, radial pulse 2+, diffuse tenderness throughout proximal humerus, mid humerus and distal humerus, most prominent over deltoid area.  Nontender to palpation along clavicle AC joint and scapular spine  Skin:    General: Skin is warm and dry.  Neurological:     Mental Status: She is alert and oriented to person, place, and time.  Psychiatric:        Mood and Affect: Mood and affect normal.      UC Treatments / Results  Labs (all labs ordered are listed, but only abnormal results are displayed) Labs Reviewed - No data to display  EKG   Radiology DG Humerus Right  Result Date: 08/09/2020 CLINICAL DATA:  Right arm pain following fall several days ago, initial encounter EXAM: RIGHT HUMERUS - 2+ VIEW COMPARISON:  None. FINDINGS: No acute fracture or dislocation is noted. The underlying bony thorax appears within normal limits.  Degenerative changes about the shoulder joint are seen. IMPRESSION: No acute abnormality noted. Electronically Signed   By: Inez Catalina M.D.   On: 08/09/2020 14:08    Procedures Procedures (including critical care time)  Medications Ordered in UC Medications - No data to display  Initial Impression / Assessment and Plan / UC Course  I have reviewed the triage vital signs and the nursing notes.  Pertinent labs & imaging results that were available during my care of the patient were reviewed by me and considered in my medical decision making (see chart for details).    X-ray negative, treating as soft tissue injury recommending to continue anti-inflammatories and gentle range of motion exercises.  Full range of motion present, do not suspect underlying tendon injury.  Discussed strict return precautions. Patient verbalized understanding and is agreeable with plan.   Final Clinical Impressions(s) / UC Diagnoses   Final diagnoses:  Pain in right upper arm     Discharge Instructions     Xray normal Use anti-inflammatories for pain/swelling. You may take up to 800 mg Ibuprofen every 8 hours with food. You may supplement Ibuprofen with Tylenol [phone] mg every 8 hours.  Or Naproysn  Alternate ice and heat Gentle range of motion Follow up if not improving or worsening    ED Prescriptions    None     PDMP not reviewed this encounter.   Janith Lima, [place] 08/09/20 1445

## 2020-08-09 NOTE — ED Triage Notes (Signed)
Pt here for right upper arm pain after getting covid booster and then having fall on Monday; pt able to move arm but sts painful

## 2020-08-09 NOTE — Discharge Instructions (Signed)
Xray normal Use anti-inflammatories for pain/swelling. You may take up to 800 mg Ibuprofen every 8 hours with food. You may supplement Ibuprofen with Tylenol [phone] mg every 8 hours.  Or Naproysn  Alternate ice and heat Gentle range of motion Follow up if not improving or worsening

## 2020-08-12 ENCOUNTER — Telehealth: Payer: Self-pay | Admitting: Physician Assistant

## 2020-08-12 NOTE — Telephone Encounter (Signed)
Requesting a copy of her lab report. Returning call from [place]. Advised that she is out of the office, will send to nurse. Thank you

## 2020-08-13 DIAGNOSIS — K116 Mucocele of salivary gland: Secondary | ICD-10-CM | POA: Diagnosis not present

## 2020-09-09 ENCOUNTER — Other Ambulatory Visit: Payer: Self-pay | Admitting: Physician Assistant

## 2020-10-11 ENCOUNTER — Other Ambulatory Visit: Payer: Self-pay | Admitting: Family Medicine

## 2020-10-11 DIAGNOSIS — Z1231 Encounter for screening mammogram for malignant neoplasm of breast: Secondary | ICD-10-CM

## 2020-10-15 ENCOUNTER — Other Ambulatory Visit: Payer: Self-pay | Admitting: Physician Assistant

## 2020-10-23 ENCOUNTER — Other Ambulatory Visit: Payer: Self-pay | Admitting: Physician Assistant

## 2020-10-28 ENCOUNTER — Other Ambulatory Visit: Payer: Self-pay

## 2020-10-28 ENCOUNTER — Other Ambulatory Visit (INDEPENDENT_AMBULATORY_CARE_PROVIDER_SITE_OTHER): Payer: BC Managed Care – PPO

## 2020-10-28 VITALS — BP 109/70 | HR 75 | Temp 97.1°F | Ht 64.0 in | Wt 124.5 lb

## 2020-10-28 DIAGNOSIS — R300 Dysuria: Secondary | ICD-10-CM | POA: Diagnosis not present

## 2020-10-28 LAB — POCT URINALYSIS DIPSTICK
Bilirubin, UA: NEGATIVE
Glucose, UA: POSITIVE — AB
Ketones, UA: NEGATIVE
Leukocytes, UA: NEGATIVE
Nitrite, UA: NEGATIVE
Protein, UA: NEGATIVE
Spec Grav, UA: 1.015 (ref 1.010–1.025)
Urobilinogen, UA: 0.2 E.U./dL
pH, UA: 5 (ref 5.0–8.0)

## 2020-10-28 NOTE — Progress Notes (Signed)
Patient complaining of Dysuria.KM.rma

## 2020-10-30 LAB — SPECIMEN STATUS REPORT

## 2020-10-30 LAB — URINE CULTURE

## 2020-10-30 MED ORDER — NITROFURANTOIN MONOHYD MACRO 100 MG PO CAPS: 100.0000 mg | ORAL_CAPSULE | Freq: Two times a day (BID) | ORAL | 0 refills | Status: DC

## 2020-10-31 ENCOUNTER — Telehealth: Payer: Self-pay | Admitting: Physician Assistant

## 2020-10-31 NOTE — Telephone Encounter (Signed)
Patient called in stating she is returning a phone call possibly about lab results. Please advise, thanks.

## 2020-11-01 ENCOUNTER — Telehealth: Payer: Self-pay | Admitting: Physician Assistant

## 2020-11-01 DIAGNOSIS — R300 Dysuria: Secondary | ICD-10-CM

## 2020-11-01 MED ORDER — NITROFURANTOIN MONOHYD MACRO 100 MG PO CAPS: 100.0000 mg | ORAL_CAPSULE | Freq: Two times a day (BID) | ORAL | 0 refills | Status: DC

## 2020-11-01 NOTE — Telephone Encounter (Signed)
-----   Message from Lorrene Reid, [place] sent at 10/30/2020  2:55 PM EDT ----- Please call Ms. Lupinacci and notify urine culture grew Klebsiella pneumonia with 10-20,000 colony forming units, and to consider a UTI present the growth is usually >100,000 colony forming units. However, because patient is symptomatic will send antibiotic for Macrobid and should take as instructed.   Thank you, Herb Grays

## 2020-11-01 NOTE — Telephone Encounter (Signed)
Pt is advised of her lab results

## 2020-11-15 ENCOUNTER — Other Ambulatory Visit: Payer: Self-pay | Admitting: Physician Assistant

## 2020-11-18 ENCOUNTER — Other Ambulatory Visit: Payer: Self-pay | Admitting: Physician Assistant

## 2020-11-18 ENCOUNTER — Telehealth: Payer: Self-pay | Admitting: Physician Assistant

## 2020-11-18 NOTE — Telephone Encounter (Signed)
  Pt called office requesting refill for Nystain-Triamcinolone ointment. Pt states she uses this for a "vaginal issue". Pt has been advised per Chesley Noon that she will need to schedule an acute appointment for evaluation and treatment as it is not appropriate for refills of this medication used for an acute issue before. Patient declined to schedule appointment for acute issue and states she will discuss with provider at future follow up visit.

## 2020-11-22 DIAGNOSIS — K589 Irritable bowel syndrome without diarrhea: Secondary | ICD-10-CM | POA: Diagnosis not present

## 2020-11-22 DIAGNOSIS — K5900 Constipation, unspecified: Secondary | ICD-10-CM | POA: Diagnosis not present

## 2020-12-09 ENCOUNTER — Other Ambulatory Visit: Payer: Self-pay | Admitting: Physician Assistant

## 2020-12-23 DIAGNOSIS — K589 Irritable bowel syndrome without diarrhea: Secondary | ICD-10-CM | POA: Diagnosis not present

## 2020-12-23 DIAGNOSIS — K5900 Constipation, unspecified: Secondary | ICD-10-CM | POA: Diagnosis not present

## 2021-01-03 ENCOUNTER — Telehealth: Payer: Self-pay | Admitting: Physician Assistant

## 2021-01-03 NOTE — Telephone Encounter (Signed)
Patient states she had a pimple on her eye x 3 days and that it is draining pus and swollen. Today is worse than last few days.   Pt advised we are unable to see in office today but that we suggest going to urgent care for evaluation and treatment. Pt verbalized understanding and was agreeable. AS, CMA

## 2021-01-03 NOTE — Telephone Encounter (Signed)
Patient's left eye is swollen. Eye is dripping pus. Please advise, thanks.

## 2021-01-06 ENCOUNTER — Other Ambulatory Visit: Payer: Self-pay

## 2021-01-06 ENCOUNTER — Ambulatory Visit (INDEPENDENT_AMBULATORY_CARE_PROVIDER_SITE_OTHER): Payer: BC Managed Care – PPO | Admitting: Physician Assistant

## 2021-01-06 ENCOUNTER — Encounter: Payer: Self-pay | Admitting: Physician Assistant

## 2021-01-06 VITALS — BP 114/67 | HR 60 | Temp 99.1°F | Ht 64.0 in | Wt 123.8 lb

## 2021-01-06 DIAGNOSIS — L03213 Periorbital cellulitis: Secondary | ICD-10-CM

## 2021-01-06 DIAGNOSIS — L739 Follicular disorder, unspecified: Secondary | ICD-10-CM

## 2021-01-06 MED ORDER — CEPHALEXIN 500 MG PO CAPS
500.0000 mg | ORAL_CAPSULE | Freq: Three times a day (TID) | ORAL | 0 refills | Status: DC
Start: 2021-01-06 — End: 2021-05-23

## 2021-01-06 MED ORDER — MUPIROCIN CALCIUM 2 % EX CREA
1.0000 | TOPICAL_CREAM | Freq: Two times a day (BID) | CUTANEOUS | 0 refills | Status: DC
Start: 2021-01-06 — End: 2021-10-07

## 2021-01-06 NOTE — Progress Notes (Signed)
Acute Office Visit  Subjective:    Patient ID: Rebecca Robertson, female    DOB: 19-Oct-1955, 65 y.o.   MRN: [id number]  Chief Complaint  Patient presents with  . Acute Visit    Eye Swelling     HPI Patient is in today for c/o of right eye swelling and redness for a couple of days. Patient reports area was small and popped pimple which expressed some white pus. States area has increased in size and is tender to touch. Denies fever, vision changes or eye pain with movement. Has applied warm compresses which has provided minimal relief. Also reports her husband noticed some puffiness underneath her right eye.  Past Medical History:  Diagnosis Date  . Anxiety   . Diabetes mellitus   . History of breast cancer ONCOLOGIST-- DR Humphrey Rolls--  NO RECURRENCE   S/P LEFT PARTIAL MASTECTOMY W/ SLN BX'S  12-02-2010---  DUCTAL CARCINOMA IN SITU--  S/P RADIATION THERAPY ENDED 02-17-2011  . Hyperlipidemia   . Personal history of radiation therapy 2012  . Pneumonia   . PONV (postoperative nausea and vomiting)   . SUI (stress urinary incontinence, female)   . Vitamin D deficiency   . Vitamin D insufficiency 03/04/2016    Past Surgical History:  Procedure Laterality Date  . BLADDER SUSPENSION N/A 02/03/2013   Procedure: [facility] PROCEDURE;  Surgeon: Reece Packer, MD;  Location: [facility];  Service: Urology;  Laterality: N/A;  . BREAST LUMPECTOMY Left 2012  . BUNIONECTOMY    . CESAREAN SECTION     X4  . LEFT HEART CATH AND CORONARY ANGIOGRAPHY N/A 01/26/2018   Procedure: LEFT HEART CATH AND CORONARY ANGIOGRAPHY;  Surgeon: Wellington Hampshire, MD;  Location: [place] [facility];  Service: Cardiovascular;  Laterality: N/A;  . MENISCUS REPAIR Left   . PARTIAL MASTECTOMY WITH AXILLARY SENTINEL LYMPH NODE BIOPSY Left 12-02-2010    Family History  Problem Relation Age of Onset  . Cancer Mother        lung  . Heart attack Father   . COPD Father   . Heart disease Father      Social History   Socioeconomic History  . Marital status: Married    Spouse name: Not on file  . Number of children: Not on file  . Years of education: Not on file  . Highest education level: Not on file  Occupational History  . Not on file  Tobacco Use  . Smoking status: Never Smoker  . Smokeless tobacco: Never Used  Vaping Use  . Vaping Use: Never used  Substance and Sexual Activity  . Alcohol use: No  . Drug use: No  . Sexual activity: Not on file  Other Topics Concern  . Not on file  Social History Narrative  . Not on file   Social Determinants of Health   Financial Resource Strain: Not on file  Food Insecurity: Not on file  Transportation Needs: Not on file  Physical Activity: Not on file  Stress: Not on file  Social Connections: Not on file  Intimate Partner Violence: Not on file    Outpatient Medications Prior to Visit  Medication Sig Dispense Refill  . albuterol (VENTOLIN HFA) 108 (90 Base) MCG/ACT inhaler INHALE 2 PUFFS INTO THE LUNGS EVERY 6 HOURS AS NEEDED FOR WHEEZING OR SHORTNESS OF BREATH. 8.5 g 1  . BAYER CONTOUR NEXT TEST test strip Check blood sugar daily  5  . citalopram (CELEXA) 20 MG tablet Take 1  tablet by mouth once daily 90 tablet 0  . Fluocinolone Acetonide Body 0.01 % OIL Apply 1 application topically 2 (two) times daily as needed. 4 mL 2  . lovastatin (MEVACOR) 40 MG tablet TAKE 1 TABLET BY MOUTH ONCE DAILY  AT 6PM 90 tablet 0  . nitrofurantoin, macrocrystal-monohydrate, (MACROBID) 100 MG capsule Take 1 capsule (100 mg total) by mouth 2 (two) times daily. 10 capsule 0  . nystatin-triamcinolone ointment (MYCOLOG) Apply 1 application topically 2 (two) times daily as needed. 30 g 0  . oxybutynin (DITROPAN) 5 MG tablet Take 1 tablet by mouth twice daily 180 tablet 0  . Semaglutide,0.25 or 0.5MG/DOS, 2 MG/1.5ML SOPN Inject 0.25 mg into the skin every Sunday.    Marland Kitchen SYNJARDY 12.12-998 MG TABS Take 1 tablet 2 (two) times daily by mouth.   4  .  vitamin B-12 (CYANOCOBALAMIN) 1000 MCG tablet Take 1,000 mcg by mouth every other day.     No facility-administered medications prior to visit.    Allergies  Allergen Reactions  . Compazine Other (See Comments)    Possible seizure, unable to talk, eyes rolled toward back of head, mouth became dry.  . Dulaglutide Other (See Comments)    GI upset  . Sitagliptin-Metformin Hcl Other (See Comments)    GI upset  . Tamoxifen Citrate Other (See Comments)  . Aspirin Other (See Comments)    bruising  . Atorvastatin Other (See Comments)    fatigue  . Bydureon [Exenatide]     Severe burning with injection  . Liraglutide Other (See Comments)    GI upset    Review of Systems A fourteen system review of systems was performed and found to be positive as per HPI.    Objective:    Physical Exam General:  Well Developed, well nourished, appropriate for stated age.  Neuro:  Alert and oriented,  extra-ocular muscles intact  HEENT:  Normocephalic, atraumatic, conjunctiva clear, erythema of upper eyelid of right eye, erythematous and swollen follicle tender to touch with scab formation underneath right eyebrow, normal left eye, swelling underneath lower eyelid of right eye noted when compared to left eye, neck supple Skin:  no gross rash, warm, pink. Cardiac:  RRR Respiratory:  ECTA B/L, Not using accessory muscles, speaking in full sentences- unlabored. Vascular:  Ext warm, no cyanosis apprec.; no gross edema  Psych:  No HI/SI, judgement and insight good, Euthymic mood. Full Affect.   BP 114/67   Pulse 60   Temp 99.1 F (37.3 C)   Ht '5\' 4"'  (1.626 m)   Wt 123 lb 12.8 oz (56.2 kg)   SpO2 100%   BMI 21.25 kg/m  Wt Readings from Last 3 Encounters:  01/06/21 123 lb 12.8 oz (56.2 kg)  10/28/20 124 lb 8 oz (56.5 kg)  08/07/20 122 lb 9.6 oz (55.6 kg)    Health Maintenance Due  Topic Date Due  . OPHTHALMOLOGY EXAM  08/24/2020  . COVID-19 Vaccine (3 - Pfizer risk 4-dose series) 09/02/2020   . URINE MICROALBUMIN  01/31/2021    There are no preventive care reminders to display for this patient.   Lab Results  Component Value Date   TSH 1.53 12/26/2019   Lab Results  Component Value Date   WBC 4.9 08/07/2020   HGB 14.0 08/07/2020   HCT 42.1 08/07/2020   MCV 89 08/07/2020   PLT 265 08/07/2020   Lab Results  Component Value Date   NA 139 08/07/2020   K 4.7 08/07/2020  CHLORIDE 108 09/10/2015   CO2 26 08/07/2020   GLUCOSE 124 (H) 08/07/2020   BUN 8 08/07/2020   CREATININE 0.53 (L) 08/07/2020   BILITOT 0.4 08/07/2020   ALKPHOS 111 08/07/2020   AST 17 08/07/2020   ALT 17 08/07/2020   PROT 7.3 08/07/2020   ALBUMIN 4.3 08/07/2020   CALCIUM 9.8 08/07/2020   ANIONGAP 11 12/06/2017   EGFR 85 (L) 09/10/2015   Lab Results  Component Value Date   CHOL 185 02/01/2020   Lab Results  Component Value Date   HDL 44 02/01/2020   Lab Results  Component Value Date   LDLCALC 116 (H) 02/01/2020   Lab Results  Component Value Date   TRIG 141 02/01/2020   Lab Results  Component Value Date   CHOLHDL 4.2 02/01/2020   Lab Results  Component Value Date   HGBA1C 6.1 (A) 08/07/2020       Assessment & Plan:   Problem List Items Addressed This Visit   None   Visit Diagnoses    Preseptal cellulitis of right eye    -  Primary   Relevant Medications   cephALEXin (KEFLEX) 500 MG capsule   Hair follicle infection       Relevant Medications   cephALEXin (KEFLEX) 500 MG capsule   mupirocin cream (BACTROBAN) 2 %     Preseptal cellulitis of right eye, Hair follicle infection: -Patient has signs and symptoms suggestive of preseptal cellulitis likely related to trauma from patient popping follicle and will start oral antibiotic therapy. No red flag signs or symptoms present for orbital cellulitis.  -Recommend to continue warm compresses and apply topical antibiotic cream. -Follow up if symptoms fail to improve or worsen.    Meds ordered this encounter   Medications  . cephALEXin (KEFLEX) 500 MG capsule    Sig: Take 1 capsule (500 mg total) by mouth 3 (three) times daily.    Dispense:  21 capsule    Refill:  0    Order Specific Question:   Supervising Provider    Answer:   Beatrice Lecher D [[id number]]  . mupirocin cream (BACTROBAN) 2 %    Sig: Apply 1 application topically 2 (two) times daily.    Dispense:  15 g    Refill:  0    Order Specific Question:   Supervising Provider    Answer:   Beatrice Lecher D [[id number]]     Lorrene Reid, PA-C

## 2021-01-06 NOTE — Patient Instructions (Signed)
Preseptal Cellulitis, Adult Preseptal cellulitis is an infection of the eyelid and the tissues around the eye (periorbital area). The infection causes painful swelling and redness. This condition may also be called periorbital cellulitis. In most cases, the condition can be treated with antibiotic medicine at home. It is important to treat preseptal cellulitis right away so that it does not get worse. If it gets worse, it can spread to the eye socket and eye muscles (orbital cellulitis). Orbital cellulitis is a medical emergency. What are the causes? Preseptal cellulitis is most commonly caused by bacteria. In rare cases, it can be caused by a virus or fungus. The germs that cause preseptal cellulitis may come from:  A sinus infection that spreads near the eyes.  An injury near the eye, such as a scratch, puncture wound, animal bite, or insect bite.  A skin rash, such as eczema or poison ivy, that becomes infected.  An infected pimple on the eyelid (stye).  Infection after eyelid surgery or injury. What increases the risk? You are more likely to develop this condition if:  You have a weakened disease-fighting system (immune system).  You have a medical condition that raises your risk for sinus infections, such as nasal polyps. What are the signs or symptoms? Symptoms of this condition include:  Eyelids that are red and swollen and feel unusually hot.  Fever.  Difficulty opening the eye.  Headache.  Pain in the face. Symptoms of this condition usually develop suddenly.   How is this diagnosed? This condition may be diagnosed based on your symptoms, your medical history, and an eye exam. You may also have tests, such as:  Blood tests.  Tests (cultures) to find out which specific bacteria are causing the infection. You may have a culture of any open wound or drainage.  CT scan.  MRI. This is less common. How is this treated? This condition is treated with antibiotic  medicines. These may be given by mouth (orally), through an IV, or as an injection. In rare cases, you may need surgery to drain an infected area. Follow these instructions at home: Medicines  Take your antibiotic medicine as told by your health care provider. Do not stop taking the antibiotic even if you start to feel better  Take over-the-counter and prescription medicines only as told by your health care provider. Eye Care  Do not use eye drops without first getting approval from your health care provider.  Do not touch or rub your eye. If you wear contact lenses, do not wear them until your health care provider approves.  Keep the eye area clean and dry.  Wash the eye area with a clean washcloth, warm water, and baby shampoo or mild soap.  To help relieve discomfort, place a clean washcloth that is wet with warm water over your eye. Leave the washcloth on for a few minutes, then remove it. General instructions  Wash your hands with soap and water often for at least 20 seconds. If soap and water are not available, use hand sanitizer.  Do not use any products that contain nicotine or tobacco, such as cigarettes, e-cigarettes, and chewing tobacco. If you need help quitting, ask your health care provider.  Drink enough fluid to keep your urine pale yellow.  Do not drive or operate machinery until your health care provider says that it is safe. Ask your health care provider if it is safe for you to drive.  Stay up to date on your vaccinations.  Keep   all follow-up visits. This includes any visits with an eye specialist (ophthalmologist) or dentist. This is important. Get help right away if:  You have new symptoms.  Your symptoms get worse or do not get better with treatment.  You have a fever.  Your vision becomes blurry or gets worse in any way.  Your eye looks like it is sticking out or bulging out (proptosis).  You develop double vision.  You have trouble moving your  eyes or pain when moving your eyes  You have a severe headache.  You have neck stiffness or severe neck pain. These symptoms may represent a serious problem that is an emergency. Do not wait to see if the symptoms will go away. Get medical help right away. Call your local emergency services (911 in the [place] Do not drive yourself to the hospital. Summary  Preseptal cellulitis is an infection of the eyelid and the tissues around the eye.  Symptoms of preseptal cellulitis usually develop suddenly and include red and swollen eyelids, fever, difficulty opening the eye, headache, and facial pain.  This condition is treated with antibiotic medicines. Do not stop taking the antibiotic even if you start to feel better.  Preseptal cellulitis can develop into orbital cellulitis, which is a medical emergency. If your condition does not improve or worsens, visit your heath care provider right away. This information is not intended to replace advice given to you by your health care provider. Make sure you discuss any questions you have with your health care provider. Document Revised: 12/06/2019 Document Reviewed: 12/06/2019 Elsevier Patient Education  2021 [employer]  

## 2021-01-15 ENCOUNTER — Other Ambulatory Visit: Payer: Self-pay | Admitting: Physician Assistant

## 2021-01-28 ENCOUNTER — Telehealth: Payer: Self-pay | Admitting: Physician Assistant

## 2021-01-28 NOTE — Telephone Encounter (Signed)
Spoke with patient about diagnostic mammogram and she states she prefers this kind of mammogram because she can leave the imaging center with results same day. She states she has bad anxiety and does not like waiting for mammogram results because she previously had breast cancer. She verbally agreed to wait for her CPE appointment on 02/03/2021 before diagnostic mammogram can be ordered.

## 2021-01-28 NOTE — Telephone Encounter (Signed)
I reviewed her chart. Looks like the past few mammograms done were screening mammograms. And that is usually what is recommended after clearance from surgery and oncology. Is she having a problem? If so, we can hold off screening mammogram until after I see her. And diagnostic mammogram can be ordered.

## 2021-01-28 NOTE — Telephone Encounter (Signed)
Patient is having a mammogram done at the breast center on Thursday in [place]. Patient would like a diagnostic mammogram due to having breast cancer. Please put attention Arkinia. Please advise, thanks.

## 2021-01-30 ENCOUNTER — Other Ambulatory Visit: Payer: Self-pay

## 2021-01-30 ENCOUNTER — Ambulatory Visit
Admission: RE | Admit: 2021-01-30 | Discharge: 2021-01-30 | Disposition: A | Discharge status: Home / Self Care | Payer: BC Managed Care – PPO | Source: Ambulatory Visit | Attending: Family Medicine | Admitting: Family Medicine

## 2021-01-30 DIAGNOSIS — Z1231 Encounter for screening mammogram for malignant neoplasm of breast: Secondary | ICD-10-CM

## 2021-02-03 ENCOUNTER — Other Ambulatory Visit: Payer: Self-pay

## 2021-02-03 ENCOUNTER — Ambulatory Visit (INDEPENDENT_AMBULATORY_CARE_PROVIDER_SITE_OTHER): Payer: BC Managed Care – PPO | Admitting: Nurse Practitioner

## 2021-02-03 ENCOUNTER — Encounter: Payer: Self-pay | Admitting: Nurse Practitioner

## 2021-02-03 ENCOUNTER — Other Ambulatory Visit: Payer: Self-pay | Admitting: Physician Assistant

## 2021-02-03 ENCOUNTER — Other Ambulatory Visit (HOSPITAL_COMMUNITY)
Admission: RE | Admit: 2021-02-03 | Discharge: 2021-02-03 | Disposition: A | Discharge status: Home / Self Care | Payer: BC Managed Care – PPO | Source: Ambulatory Visit | Attending: Nurse Practitioner | Admitting: Nurse Practitioner

## 2021-02-03 VITALS — BP 116/62 | HR 52 | Temp 98.7°F | Ht 64.0 in | Wt 120.6 lb

## 2021-02-03 DIAGNOSIS — E0800 Diabetes mellitus due to underlying condition with hyperosmolarity without nonketotic hyperglycemic-hyperosmolar coma (NKHHC): Secondary | ICD-10-CM

## 2021-02-03 DIAGNOSIS — Z01419 Encounter for gynecological examination (general) (routine) without abnormal findings: Secondary | ICD-10-CM | POA: Insufficient documentation

## 2021-02-03 DIAGNOSIS — E785 Hyperlipidemia, unspecified: Secondary | ICD-10-CM | POA: Diagnosis not present

## 2021-02-03 DIAGNOSIS — E559 Vitamin D deficiency, unspecified: Secondary | ICD-10-CM | POA: Diagnosis not present

## 2021-02-03 DIAGNOSIS — E538 Deficiency of other specified B group vitamins: Secondary | ICD-10-CM | POA: Diagnosis not present

## 2021-02-03 DIAGNOSIS — Z0000 Encounter for general adult medical examination without abnormal findings: Secondary | ICD-10-CM | POA: Diagnosis not present

## 2021-02-03 DIAGNOSIS — Z853 Personal history of malignant neoplasm of breast: Secondary | ICD-10-CM

## 2021-02-03 LAB — POCT GLYCOSYLATED HEMOGLOBIN (HGB A1C): Hemoglobin A1C: 6.6 % — AB (ref 4.0–5.6)

## 2021-02-03 LAB — POCT UA - MICROALBUMIN

## 2021-02-03 NOTE — Progress Notes (Signed)
Established Patient Office Visit  Subjective:  Patient ID: Rebecca Robertson, female    DOB: [birth year]  Age: 65 y.o. MRN: [id number]  CC:  Chief Complaint  Patient presents with   Annual Exam   Gynecologic Exam    HPI [name] presents for annual wellness exam and pap smear. She is diabetic. Blood sugars have been doing well. Her HgbA1c is 6.6 today and her urine microalbumin is normal. She does see and endocrinologist about every 4 months.  She has history of breast cancer. She had lumpectomy. She she had screening mammogram on 01/30/2021. Results were benign. I printed result note for her to have.  The patient is due to have routine, fasting labs.  She has no concerns or complaints today. She denies chest pain, chest pressure, or shortness of breath. She denies headaches or visual disturbances. She denies abdominal pain, nausea, vomiting, or changes in bowel or bladder habits.    Past Medical History:  Diagnosis Date   Anxiety    Diabetes mellitus    History of breast cancer ONCOLOGIST-- DR Humphrey Rolls--  NO RECURRENCE   S/P LEFT PARTIAL MASTECTOMY W/ SLN BX'S  12-02-2010---  DUCTAL CARCINOMA IN SITU--  S/P RADIATION THERAPY ENDED 02-17-2011   Hyperlipidemia    Personal history of radiation therapy 2012   Pneumonia    PONV (postoperative nausea and vomiting)    SUI (stress urinary incontinence, female)    Vitamin D deficiency    Vitamin D insufficiency 03/04/2016    Past Surgical History:  Procedure Laterality Date   BLADDER SUSPENSION N/A 02/03/2013   Procedure: SPARC PROCEDURE;  Surgeon: Reece Packer, MD;  Location: [facility];  Service: Urology;  Laterality: N/A;   BREAST LUMPECTOMY Left 2012   BUNIONECTOMY     CESAREAN SECTION     X4   LEFT HEART CATH AND CORONARY ANGIOGRAPHY N/A 01/26/2018   Procedure: LEFT HEART CATH AND CORONARY ANGIOGRAPHY;  Surgeon: Wellington Hampshire, MD;  Location: [facility];  Service: Cardiovascular;   Laterality: N/A;   MENISCUS REPAIR Left    PARTIAL MASTECTOMY WITH AXILLARY SENTINEL LYMPH NODE BIOPSY Left 12-02-2010    Family History  Problem Relation Age of Onset   Cancer Mother        lung   Heart attack Father    COPD Father    Heart disease Father     Social History   Socioeconomic History   Marital status: Married    Spouse name: Not on file   Number of children: Not on file   Years of education: Not on file   Highest education level: Not on file  Occupational History   Not on file  Tobacco Use   Smoking status: Never   Smokeless tobacco: Never  Vaping Use   Vaping Use: Never used  Substance and Sexual Activity   Alcohol use: No   Drug use: No   Sexual activity: Not on file  Other Topics Concern   Not on file  Social History Narrative   Not on file   Social Determinants of Health   Financial Resource Strain: Not on file  Food Insecurity: Not on file  Transportation Needs: Not on file  Physical Activity: Not on file  Stress: Not on file  Social Connections: Not on file  Intimate Partner Violence: Not on file    Outpatient Medications Prior to Visit  Medication Sig Dispense Refill   albuterol (VENTOLIN HFA) 108 (90 Base) MCG/ACT  inhaler INHALE 2 PUFFS INTO THE LUNGS EVERY 6 HOURS AS NEEDED FOR WHEEZING OR SHORTNESS OF BREATH. 8.5 g 1   BAYER CONTOUR NEXT TEST test strip Check blood sugar daily  5   cephALEXin (KEFLEX) 500 MG capsule Take 1 capsule (500 mg total) by mouth 3 (three) times daily. 21 capsule 0   citalopram (CELEXA) 20 MG tablet Take 1 tablet by mouth once daily 90 tablet 0   Fluocinolone Acetonide Body 0.01 % OIL Apply 1 application topically 2 (two) times daily as needed. 4 mL 2   lovastatin (MEVACOR) 40 MG tablet TAKE 1 TABLET BY MOUTH ONCE DAILY  AT 6PM 90 tablet 0   mupirocin cream (BACTROBAN) 2 % Apply 1 application topically 2 (two) times daily. 15 g 0   nitrofurantoin, macrocrystal-monohydrate, (MACROBID) 100 MG capsule Take 1  capsule (100 mg total) by mouth 2 (two) times daily. 10 capsule 0   nystatin-triamcinolone ointment (MYCOLOG) Apply 1 application topically 2 (two) times daily as needed. 30 g 0   oxybutynin (DITROPAN) 5 MG tablet Take 1 tablet by mouth twice daily 180 tablet 0   Semaglutide,0.25 or 0.5MG/DOS, 2 MG/1.5ML SOPN Inject 0.25 mg into the skin every Sunday.     SYNJARDY 12.12-998 MG TABS Take 1 tablet 2 (two) times daily by mouth.   4   vitamin B-12 (CYANOCOBALAMIN) 1000 MCG tablet Take 1,000 mcg by mouth every other day.     No facility-administered medications prior to visit.    Allergies  Allergen Reactions   Compazine Other (See Comments)    Possible seizure, unable to talk, eyes rolled toward back of head, mouth became dry.   Dulaglutide Other (See Comments)    GI upset   Sitagliptin-Metformin Hcl Other (See Comments)    GI upset   Tamoxifen Citrate Other (See Comments)   Aspirin Other (See Comments)    bruising   Atorvastatin Other (See Comments)    fatigue   Bydureon [Exenatide]     Severe burning with injection   Liraglutide Other (See Comments)    GI upset    ROS Review of Systems  Constitutional:  Negative for activity change, appetite change, chills, fatigue and fever.  HENT:  Negative for congestion, postnasal drip, rhinorrhea and sinus pressure.   Eyes: Negative.   Respiratory:  Negative for cough, chest tightness and shortness of breath.   Cardiovascular:  Negative for chest pain and palpitations.  Gastrointestinal:  Negative for diarrhea, nausea and vomiting.  Endocrine: Negative for cold intolerance, heat intolerance, polydipsia and polyuria.       Blood sugars doing well    Genitourinary:  Negative for dysuria, frequency, hematuria and urgency.  Musculoskeletal:  Negative for arthralgias and back pain.  Skin:  Negative for rash.  Allergic/Immunologic: Negative.   Neurological:  Negative for dizziness, weakness and headaches.  Hematological: Negative.    Psychiatric/Behavioral:  Negative for dysphoric mood and sleep disturbance. The patient is not nervous/anxious.      Objective:    Physical Exam Vitals and nursing note reviewed. Exam conducted with a chaperone present.  Constitutional:      Appearance: Normal appearance. She is well-developed.  HENT:     Head: Normocephalic and atraumatic.     Right Ear: Ear canal and external ear normal.     Left Ear: Ear canal and external ear normal.     Nose: Nose normal.     Mouth/Throat:     Mouth: Mucous membranes are moist.  Pharynx: Oropharynx is clear.  Eyes:     Extraocular Movements: Extraocular movements intact.     Conjunctiva/sclera: Conjunctivae normal.     Pupils: Pupils are equal, round, and reactive to light.  Neck:     Vascular: No carotid bruit.  Cardiovascular:     Rate and Rhythm: Normal rate and regular rhythm.     Pulses: Normal pulses.          Dorsalis pedis pulses are 2+ on the right side and 2+ on the left side.       Posterior tibial pulses are 2+ on the right side and 2+ on the left side.     Heart sounds: Normal heart sounds.  Pulmonary:     Effort: Pulmonary effort is normal.     Breath sounds: Normal breath sounds.  Abdominal:     General: Bowel sounds are normal. There is no distension.     Palpations: Abdomen is soft. There is no mass.     Tenderness: There is no abdominal tenderness. There is no guarding.     Hernia: There is no hernia in the left inguinal area or right inguinal area.  Genitourinary:    Exam position: Supine.     Labia:        Right: No rash, tenderness, lesion or injury.        Left: No rash, tenderness, lesion or injury.      Vagina: Erythema present. No vaginal discharge, tenderness, bleeding or lesions.     Cervix: No cervical motion tenderness, discharge, friability, lesion, erythema or cervical bleeding.     Uterus: Normal.      Adnexa: Right adnexa normal and left adnexa normal.     Comments: No tenderness, masses, or  organomeglay present during bimanual exam .   Musculoskeletal:        General: Normal range of motion.     Cervical back: Normal range of motion and neck supple.     Right foot: Normal range of motion. No deformity or bunion.     Left foot: Normal range of motion. No deformity or bunion.  Feet:     Right foot:     Protective Sensation: 10 sites tested.  8 sites sensed.     Skin integrity: Skin integrity normal.     Toenail Condition: Right toenails are normal.     Left foot:     Protective Sensation: 10 sites tested.  8 sites sensed.     Skin integrity: Skin integrity normal.     Toenail Condition: Left toenails are normal.  Lymphadenopathy:     Cervical: No cervical adenopathy.     Lower Body: No right inguinal adenopathy. No left inguinal adenopathy.  Skin:    General: Skin is warm and dry.     Capillary Refill: Capillary refill takes less than 2 seconds.  Neurological:     General: No focal deficit present.     Mental Status: She is alert and oriented to person, place, and time.  Psychiatric:        Mood and Affect: Mood normal.        Behavior: Behavior normal.        Thought Content: Thought content normal.        Judgment: Judgment normal.   Today's Vitals   02/03/21 0912  BP: 116/62  Pulse: (!) 52  Temp: 98.7 F (37.1 C)  SpO2: 99%  Weight: 120 lb 9.6 oz (54.7 kg)  Height: '5\' 4"'  (1.626 m)  Body mass index is 20.7 kg/m.   Wt Readings from Last 3 Encounters:  02/03/21 120 lb 9.6 oz (54.7 kg)  01/06/21 123 lb 12.8 oz (56.2 kg)  10/28/20 124 lb 8 oz (56.5 kg)     Health Maintenance Due  Topic Date Due   Pneumococcal Vaccine 19-42 Years old (1 - PCV) Never done   HIV Screening  Never done   Hepatitis C Screening  Never done   Zoster Vaccines- Shingrix (1 of 2) Never done   OPHTHALMOLOGY EXAM  08/24/2020   COVID-19 Vaccine (3 - Pfizer risk series) 09/02/2020    There are no preventive care reminders to display for this patient.  Lab Results  Component  Value Date   TSH 1.53 12/26/2019   Lab Results  Component Value Date   WBC 4.9 08/07/2020   HGB 14.0 08/07/2020   HCT 42.1 08/07/2020   MCV 89 08/07/2020   PLT 265 08/07/2020   Lab Results  Component Value Date   NA 139 08/07/2020   K 4.7 08/07/2020   CHLORIDE 108 09/10/2015   CO2 26 08/07/2020   GLUCOSE 124 (H) 08/07/2020   BUN 8 08/07/2020   CREATININE 0.53 (L) 08/07/2020   BILITOT 0.4 08/07/2020   ALKPHOS 111 08/07/2020   AST 17 08/07/2020   ALT 17 08/07/2020   PROT 7.3 08/07/2020   ALBUMIN 4.3 08/07/2020   CALCIUM 9.8 08/07/2020   ANIONGAP 11 12/06/2017   EGFR 85 (L) 09/10/2015   Lab Results  Component Value Date   CHOL 185 02/01/2020   Lab Results  Component Value Date   HDL 44 02/01/2020   Lab Results  Component Value Date   LDLCALC 116 (H) 02/01/2020   Lab Results  Component Value Date   TRIG 141 02/01/2020   Lab Results  Component Value Date   CHOLHDL 4.2 02/01/2020   Lab Results  Component Value Date   HGBA1C 6.6 (A) 02/03/2021      Assessment & Plan:  1. Well woman exam with routine gynecological exam Annual wellness visit today and pap smear.  - Cytology - PAP( [facility])  2. Diabetes mellitus due to underlying condition with hyperosmolarity without coma, without long-term current use of insulin (HCC) HgbA1c is 6.6 today and microalbumin is normal. Conitnue medications as prescribed. Check CMP and thyroid panel for further evaluation.  - POCT UA - Microalbumin - POCT glycosylated hemoglobin (Hb A1C) - Comprehensive metabolic panel - T4, free - TSH  3. Hyperlipidemia, unspecified hyperlipidemia type Check fasting lipid panel and adjust statin medication as indicated.  - CBC with Differential/Platelet - Comprehensive metabolic panel - T4, free - TSH - Lipid panel  4. B12 deficiency Check B12 level today and treat as indicated.  - Iron, TIBC and Ferritin Panel - Vitamin B12  5. Vitamin D insufficiency Check vitamin d and  treat as indicated.  - Vitamin D 1,25 dihydroxy  6. Healthcare maintenance Fasting labs drawn during today's visit. Will discuss with patient when results are available.  - CBC with Differential/Platelet - Comprehensive metabolic panel - T4, free - TSH  7. Personal history of breast cancer Screening mammogram done 01/31/2021 with benign results.    Problem List Items Addressed This Visit       Endocrine   Diabetes ([place]) (Chronic)   Relevant Orders   POCT UA - Microalbumin (Completed)   POCT glycosylated hemoglobin (Hb A1C) (Completed)   Comprehensive metabolic panel   T4, free   TSH  Other   Hyperlipidemia (Chronic)   Relevant Orders   CBC with Differential/Platelet   Comprehensive metabolic panel   T4, free   TSH   Lipid panel   Vitamin D insufficiency (Chronic)   Relevant Orders   Vitamin D 1,25 dihydroxy   B12 deficiency (Chronic)   Relevant Orders   Iron, TIBC and Ferritin Panel   Vitamin B12   Healthcare maintenance   Relevant Orders   CBC with Differential/Platelet   Comprehensive metabolic panel   T4, free   TSH   Well woman exam with routine gynecological exam - Primary   Relevant Orders   Cytology - PAP( [facility])   Personal history of breast cancer    No orders of the defined types were placed in this encounter.   Follow-up: Return in about 6 months (around 08/05/2021) for dm, lipids, check HgbA1c.    Ronnell Freshwater, NP

## 2021-02-04 ENCOUNTER — Telehealth: Payer: Self-pay | Admitting: Nurse Practitioner

## 2021-02-04 LAB — CBC WITH DIFFERENTIAL/PLATELET
Basophils Absolute: 0.1 10*3/uL (ref 0.0–0.2)
Basos: 1 %
EOS (ABSOLUTE): 0.1 10*3/uL (ref 0.0–0.4)
Eos: 1 %
Hematocrit: 42.9 % (ref 34.0–46.6)
Hemoglobin: 14.2 g/dL (ref 11.1–15.9)
Immature Grans (Abs): 0 10*3/uL (ref 0.0–0.1)
Immature Granulocytes: 0 %
Lymphocytes Absolute: 2 10*3/uL (ref 0.7–3.1)
Lymphs: 31 %
MCH: 29.5 pg (ref 26.6–33.0)
MCHC: 33.1 g/dL (ref 31.5–35.7)
MCV: 89 fL (ref 79–97)
Monocytes Absolute: 0.5 10*3/uL (ref 0.1–0.9)
Monocytes: 8 %
Neutrophils Absolute: 3.9 10*3/uL (ref 1.4–7.0)
Neutrophils: 59 %
Platelets: 254 10*3/uL (ref 150–450)
RBC: 4.82 x10E6/uL (ref 3.77–5.28)
RDW: 13 % (ref 11.7–15.4)
WBC: 6.6 10*3/uL (ref 3.4–10.8)

## 2021-02-04 LAB — CYTOLOGY - PAP
Comment: NEGATIVE
Diagnosis: NEGATIVE
High risk HPV: NEGATIVE

## 2021-02-04 LAB — VITAMIN B12: Vitamin B-12: 832 pg/mL (ref 232–1245)

## 2021-02-04 LAB — VITAMIN D 25 HYDROXY (VIT D DEFICIENCY, FRACTURES): Vit D, 25-Hydroxy: 33.6 ng/mL (ref 30.0–100.0)

## 2021-02-04 LAB — COMPREHENSIVE METABOLIC PANEL
ALT: 14 IU/L (ref 0–32)
AST: 15 IU/L (ref 0–40)
Albumin/Globulin Ratio: 1.8 (ref 1.2–2.2)
Albumin: 4.7 g/dL (ref 3.8–4.8)
Alkaline Phosphatase: 86 IU/L (ref 44–121)
BUN/Creatinine Ratio: 14 (ref 12–28)
BUN: 8 mg/dL (ref 8–27)
Bilirubin Total: 0.2 mg/dL (ref 0.0–1.2)
CO2: 24 mmol/L (ref 20–29)
Calcium: 10.2 mg/dL (ref 8.7–10.3)
Chloride: 100 mmol/L (ref 96–106)
Creatinine, Ser: 0.56 mg/dL — ABNORMAL LOW (ref 0.57–1.00)
Globulin, Total: 2.6 g/dL (ref 1.5–4.5)
Glucose: 118 mg/dL — ABNORMAL HIGH (ref 65–99)
Potassium: 4.8 mmol/L (ref 3.5–5.2)
Sodium: 141 mmol/L (ref 134–144)
Total Protein: 7.3 g/dL (ref 6.0–8.5)
eGFR: 102 mL/min/{1.73_m2} (ref >59)

## 2021-02-04 LAB — LIPID PANEL
Chol/HDL Ratio: 3.9 ratio (ref 0.0–4.4)
Cholesterol, Total: 179 mg/dL (ref 100–199)
HDL: 46 mg/dL (ref >39)
LDL Chol Calc (NIH): 104 mg/dL — ABNORMAL HIGH (ref 0–99)
Triglycerides: 166 mg/dL — ABNORMAL HIGH (ref 0–149)
VLDL Cholesterol Cal: 29 mg/dL (ref 5–40)

## 2021-02-04 LAB — IRON,TIBC AND FERRITIN PANEL
Ferritin: 31 ng/mL (ref 15–150)
Iron Saturation: 11 % — ABNORMAL LOW (ref 15–55)
Iron: 35 ug/dL (ref 27–139)
Total Iron Binding Capacity: 313 ug/dL (ref 250–450)
UIBC: 278 ug/dL (ref 118–369)

## 2021-02-04 LAB — T4, FREE: Free T4: 1.02 ng/dL (ref 0.82–1.77)

## 2021-02-04 LAB — TSH: TSH: 1.29 u[IU]/mL (ref 0.450–4.500)

## 2021-02-04 NOTE — Telephone Encounter (Signed)
Called pt she is advised of her lab results and would like a copy of her results its ready for pickup

## 2021-02-04 NOTE — Progress Notes (Signed)
Please let the patient know that labs are back. Her lipid panel shows that triglycerides are just a little higher than they should be, however, the total cholesterol and LDL are improved from last two years. All other labs were good. Thanks.

## 2021-02-04 NOTE — Telephone Encounter (Signed)
Patient is returning your call.    Thanks

## 2021-02-07 DIAGNOSIS — R1084 Generalized abdominal pain: Secondary | ICD-10-CM | POA: Diagnosis not present

## 2021-02-07 DIAGNOSIS — R197 Diarrhea, unspecified: Secondary | ICD-10-CM | POA: Diagnosis not present

## 2021-02-11 ENCOUNTER — Telehealth: Payer: Self-pay | Admitting: Nurse Practitioner

## 2021-02-11 NOTE — Telephone Encounter (Signed)
Patient is aware of the results and verbalized understanding. AS, CMA 

## 2021-02-11 NOTE — Telephone Encounter (Signed)
Patient would like to know if her pap has come back yet, please advise.

## 2021-02-11 NOTE — Telephone Encounter (Signed)
Yes. It did come back and results were normal. We would repeat pap in three years.

## 2021-02-12 DIAGNOSIS — R197 Diarrhea, unspecified: Secondary | ICD-10-CM | POA: Diagnosis not present

## 2021-02-12 DIAGNOSIS — R109 Unspecified abdominal pain: Secondary | ICD-10-CM | POA: Diagnosis not present

## 2021-02-12 DIAGNOSIS — K589 Irritable bowel syndrome without diarrhea: Secondary | ICD-10-CM | POA: Diagnosis not present

## 2021-02-12 DIAGNOSIS — E119 Type 2 diabetes mellitus without complications: Secondary | ICD-10-CM | POA: Diagnosis not present

## 2021-02-12 DIAGNOSIS — K5900 Constipation, unspecified: Secondary | ICD-10-CM | POA: Diagnosis not present

## 2021-02-24 DIAGNOSIS — R197 Diarrhea, unspecified: Secondary | ICD-10-CM | POA: Diagnosis not present

## 2021-03-11 ENCOUNTER — Other Ambulatory Visit: Payer: Self-pay | Admitting: Physician Assistant

## 2021-03-17 DIAGNOSIS — E119 Type 2 diabetes mellitus without complications: Secondary | ICD-10-CM | POA: Diagnosis not present

## 2021-03-17 DIAGNOSIS — Z8639 Personal history of other endocrine, nutritional and metabolic disease: Secondary | ICD-10-CM | POA: Diagnosis not present

## 2021-03-21 DIAGNOSIS — K589 Irritable bowel syndrome without diarrhea: Secondary | ICD-10-CM | POA: Diagnosis not present

## 2021-03-21 DIAGNOSIS — K5900 Constipation, unspecified: Secondary | ICD-10-CM | POA: Diagnosis not present

## 2021-03-21 DIAGNOSIS — K8681 Exocrine pancreatic insufficiency: Secondary | ICD-10-CM | POA: Diagnosis not present

## 2021-03-26 DIAGNOSIS — K8681 Exocrine pancreatic insufficiency: Secondary | ICD-10-CM | POA: Diagnosis not present

## 2021-04-17 ENCOUNTER — Other Ambulatory Visit: Payer: Self-pay | Admitting: Physician Assistant

## 2021-04-24 ENCOUNTER — Ambulatory Visit
Admission: RE | Admit: 2021-04-24 | Discharge: 2021-04-24 | Disposition: A | Discharge status: Home / Self Care | Payer: BC Managed Care – PPO | Source: Ambulatory Visit | Attending: Physician Assistant | Admitting: Physician Assistant

## 2021-04-24 ENCOUNTER — Other Ambulatory Visit: Payer: Self-pay

## 2021-04-24 ENCOUNTER — Other Ambulatory Visit: Payer: Self-pay | Admitting: Physician Assistant

## 2021-04-24 DIAGNOSIS — R1032 Left lower quadrant pain: Secondary | ICD-10-CM | POA: Diagnosis not present

## 2021-04-24 DIAGNOSIS — K5900 Constipation, unspecified: Secondary | ICD-10-CM

## 2021-04-24 DIAGNOSIS — R109 Unspecified abdominal pain: Secondary | ICD-10-CM | POA: Diagnosis not present

## 2021-04-28 ENCOUNTER — Other Ambulatory Visit: Payer: Self-pay | Admitting: Physician Assistant

## 2021-05-23 ENCOUNTER — Ambulatory Visit: Payer: BC Managed Care – PPO | Admitting: Physician Assistant

## 2021-05-23 ENCOUNTER — Encounter: Payer: Self-pay | Admitting: Physician Assistant

## 2021-05-23 ENCOUNTER — Other Ambulatory Visit: Payer: Self-pay

## 2021-05-23 VITALS — BP 124/71 | HR 65 | Temp 98.7°F | Ht 64.0 in | Wt 119.5 lb

## 2021-05-23 DIAGNOSIS — E0800 Diabetes mellitus due to underlying condition with hyperosmolarity without nonketotic hyperglycemic-hyperosmolar coma (NKHHC): Secondary | ICD-10-CM | POA: Diagnosis not present

## 2021-05-23 DIAGNOSIS — R300 Dysuria: Secondary | ICD-10-CM | POA: Diagnosis not present

## 2021-05-23 DIAGNOSIS — E119 Type 2 diabetes mellitus without complications: Secondary | ICD-10-CM | POA: Diagnosis not present

## 2021-05-23 DIAGNOSIS — R350 Frequency of micturition: Secondary | ICD-10-CM | POA: Diagnosis not present

## 2021-05-23 LAB — POCT URINALYSIS DIPSTICK
Bilirubin, UA: NEGATIVE
Glucose, UA: POSITIVE — AB
Ketones, UA: NEGATIVE
Leukocytes, UA: NEGATIVE
Nitrite, UA: NEGATIVE
Protein, UA: NEGATIVE
Spec Grav, UA: 1.015 (ref 1.010–1.025)
Urobilinogen, UA: 0.2 E.U./dL
pH, UA: 5 (ref 5.0–8.0)

## 2021-05-23 LAB — POCT GLYCOSYLATED HEMOGLOBIN (HGB A1C): Hemoglobin A1C: 6.3 % — AB (ref 4.0–5.6)

## 2021-05-23 MED ORDER — NYSTATIN-TRIAMCINOLONE 100000-0.1 UNIT/GM-% EX OINT: 1.0000 "application " | TOPICAL_OINTMENT | Freq: Two times a day (BID) | CUTANEOUS | 0 refills | Status: DC | PRN

## 2021-05-23 NOTE — Progress Notes (Signed)
Acute Office Visit  Subjective:    Patient ID: Rebecca Robertson, female    DOB: 09/11/1955, 65 y.o.   MRN: [id number]  Chief Complaint  Patient presents with   Acute Visit   Urinary Tract Infection    HPI Patient is in today for c/o urinary frequency and dysuria. Symptoms started yesterday. Denies abdominal or flank pain, fever, n/v, or vaginal discharge. Has not tried anything for symptoms. States recently diagnosed with pancreatic insufficiency. Patient states her fasting sugars at home have been good, <130. Does report consuming more sugar than usual.  Past Medical History:  Diagnosis Date   Anxiety    Diabetes mellitus    History of breast cancer ONCOLOGIST-- DR Humphrey Rolls--  NO RECURRENCE   S/P LEFT PARTIAL MASTECTOMY W/ SLN BX'S  12-02-2010---  DUCTAL CARCINOMA IN SITU--  S/P RADIATION THERAPY ENDED 02-17-2011   Hyperlipidemia    Personal history of radiation therapy 2012   Pneumonia    PONV (postoperative nausea and vomiting)    SUI (stress urinary incontinence, female)    Vitamin D deficiency    Vitamin D insufficiency 03/04/2016    Past Surgical History:  Procedure Laterality Date   BLADDER SUSPENSION N/A 02/03/2013   Procedure: SPARC PROCEDURE;  Surgeon: Reece Packer, MD;  Location: [facility];  Service: Urology;  Laterality: N/A;   BREAST LUMPECTOMY Left 2012   BUNIONECTOMY     CESAREAN SECTION     X4   LEFT HEART CATH AND CORONARY ANGIOGRAPHY N/A 01/26/2018   Procedure: LEFT HEART CATH AND CORONARY ANGIOGRAPHY;  Surgeon: Wellington Hampshire, MD;  Location: [facility];  Service: Cardiovascular;  Laterality: N/A;   MENISCUS REPAIR Left    PARTIAL MASTECTOMY WITH AXILLARY SENTINEL LYMPH NODE BIOPSY Left 12-02-2010    Family History  Problem Relation Age of Onset   Cancer Mother        lung   Heart attack Father    COPD Father    Heart disease Father     Social History   Socioeconomic History   Marital status: Married    Spouse  name: Not on file   Number of children: Not on file   Years of education: Not on file   Highest education level: Not on file  Occupational History   Not on file  Tobacco Use   Smoking status: Never   Smokeless tobacco: Never  Vaping Use   Vaping Use: Never used  Substance and Sexual Activity   Alcohol use: No   Drug use: No   Sexual activity: Not on file  Other Topics Concern   Not on file  Social History Narrative   Not on file   Social Determinants of Health   Financial Resource Strain: Not on file  Food Insecurity: Not on file  Transportation Needs: Not on file  Physical Activity: Not on file  Stress: Not on file  Social Connections: Not on file  Intimate Partner Violence: Not on file    Outpatient Medications Prior to Visit  Medication Sig Dispense Refill   albuterol (VENTOLIN HFA) 108 (90 Base) MCG/ACT inhaler INHALE 2 PUFFS INTO THE LUNGS EVERY 6 HOURS AS NEEDED FOR WHEEZING OR SHORTNESS OF BREATH. 8.5 g 1   BAYER CONTOUR NEXT TEST test strip Check blood sugar daily  5   citalopram (CELEXA) 20 MG tablet Take 1 tablet by mouth once daily 90 tablet 0   Fluocinolone Acetonide Body 0.01 % OIL Apply 1 application topically  2 (two) times daily as needed. 4 mL 2   lovastatin (MEVACOR) 40 MG tablet TAKE 1 TABLET BY MOUTH ONCE DAILY 6 IN THE EVENING 90 tablet 0   mupirocin cream (BACTROBAN) 2 % Apply 1 application topically 2 (two) times daily. 15 g 0   oxybutynin (DITROPAN) 5 MG tablet Take 1 tablet by mouth twice daily 180 tablet 0   Semaglutide,0.25 or 0.5MG/DOS, 2 MG/1.5ML SOPN Inject 0.25 mg into the skin every Sunday.     SYNJARDY 12.12-998 MG TABS Take 1 tablet 2 (two) times daily by mouth.   4   TRULANCE 3 MG TABS Take 1 tablet by mouth daily.     vitamin B-12 (CYANOCOBALAMIN) 1000 MCG tablet Take 1,000 mcg by mouth every other day.     nystatin-triamcinolone ointment (MYCOLOG) Apply 1 application topically 2 (two) times daily as needed. 30 g 0   cephALEXin (KEFLEX)  500 MG capsule Take 1 capsule (500 mg total) by mouth 3 (three) times daily. 21 capsule 0   nitrofurantoin, macrocrystal-monohydrate, (MACROBID) 100 MG capsule Take 1 capsule (100 mg total) by mouth 2 (two) times daily. 10 capsule 0   No facility-administered medications prior to visit.    Allergies  Allergen Reactions   Compazine Other (See Comments)    Possible seizure, unable to talk, eyes rolled toward back of head, mouth became dry.   Dulaglutide Other (See Comments)    GI upset   Sitagliptin-Metformin Hcl Other (See Comments)    GI upset   Tamoxifen Citrate Other (See Comments)   Aspirin Other (See Comments)    bruising   Atorvastatin Other (See Comments)    fatigue   Bydureon [Exenatide]     Severe burning with injection   Liraglutide Other (See Comments)    GI upset    Review of Systems A fourteen system review of systems was performed and found to be positive as per HPI.  Objective:   Physical Exam General:  Well Developed, well nourished, appropriate for stated age.  Neuro:  Alert and oriented,  extra-ocular muscles intact  HEENT:  Normocephalic, atraumatic, neck supple Skin:  no gross rash, warm, pink. Cardiac:  RRR, S1 S2 Respiratory:  CTA B/L, Not using accessory muscles, speaking in full sentences- unlabored. Abdomen: non-tender, non-distended, no guarding or rebound tenderness, neg CVA tenderness b/l Vascular:  Ext warm, no cyanosis apprec.; cap RF less 2 sec. Psych:  No HI/SI, judgement and insight good, Euthymic mood. Full Affect.  BP 124/71   Pulse 65   Temp 98.7 F (37.1 C)   Ht _0  (1.626 m)   Wt 119 lb 8 oz (54.2 kg)   SpO2 99%   BMI 20.51 kg/m  Wt Readings from Last 3 Encounters:  05/23/21 119 lb 8 oz (54.2 kg)  02/03/21 120 lb 9.6 oz (54.7 kg)  01/06/21 123 lb 12.8 oz (56.2 kg)    Health Maintenance Due  Topic Date Due   HIV Screening  Never done   Hepatitis C Screening  Never done   Zoster Vaccines- Shingrix (1 of 2) Never done    COVID-19 Vaccine (3 - Pfizer risk series) 09/02/2020   INFLUENZA VACCINE  03/17/2021    There are no preventive care reminders to display for this patient.   Lab Results  Component Value Date   TSH 1.290 02/03/2021   Lab Results  Component Value Date   WBC 6.6 02/03/2021   HGB 14.2 02/03/2021   HCT 42.9 02/03/2021   MCV 89  02/03/2021   PLT 254 02/03/2021   Lab Results  Component Value Date   NA 141 02/03/2021   K 4.8 02/03/2021   CHLORIDE 108 09/10/2015   CO2 24 02/03/2021   GLUCOSE 118 (H) 02/03/2021   BUN 8 02/03/2021   CREATININE 0.56 (L) 02/03/2021   BILITOT 0.2 02/03/2021   ALKPHOS 86 02/03/2021   AST 15 02/03/2021   ALT 14 02/03/2021   PROT 7.3 02/03/2021   ALBUMIN 4.7 02/03/2021   CALCIUM 10.2 02/03/2021   ANIONGAP 11 12/06/2017   EGFR 102 02/03/2021   Lab Results  Component Value Date   CHOL 179 02/03/2021   Lab Results  Component Value Date   HDL 46 02/03/2021   Lab Results  Component Value Date   LDLCALC 104 (H) 02/03/2021   Lab Results  Component Value Date   TRIG 166 (H) 02/03/2021   Lab Results  Component Value Date   CHOLHDL 3.9 02/03/2021   Lab Results  Component Value Date   HGBA1C 6.3 (A) 05/23/2021       Assessment & Plan:   Problem List Items Addressed This Visit       Endocrine   Diabetes ([place]) (Chronic)   Relevant Orders   POCT glycosylated hemoglobin (Hb A1C) (Completed)   Other Visit Diagnoses     Urination frequency    -  Primary   Relevant Orders   POCT urinalysis dipstick (Completed)   Urine Culture   Comp Met (CMET)   CBC w/Diff   Urine Culture   Burning with urination       Relevant Orders   POCT urinalysis dipstick (Completed)   Urine Culture   Comp Met (CMET)   CBC w/Diff   Urine Culture      Burning with urination, urination frequency: -UA collected and essentially negative for UTI: negative for nitrites, leukocytes, protein, bilirubin, ketones, a trace of intact blood with glucose >1000 mg/dL  (patient is on [place]). Will send for urine culture to r/o cystitis and pending results will start antibiotic therapy if indicated. Will collect labs to evaluate for leukocytosis, renal and liver function. Will collect A1c to evaluate for changes/uncontrolled diabetes mellitus. Recommend to trial Azo and monitor for worsening symptoms.  Meds ordered this encounter  Medications   nystatin-triamcinolone ointment (MYCOLOG)    Sig: Apply 1 application topically 2 (two) times daily as needed.    Dispense:  60 g    Refill:  0    Order Specific Question:   Supervising Provider    Answer:   Beatrice Lecher D [[id number]]     Lorrene Reid, PA-C

## 2021-05-24 ENCOUNTER — Other Ambulatory Visit: Payer: Self-pay | Admitting: Physician Assistant

## 2021-05-24 LAB — COMPREHENSIVE METABOLIC PANEL
ALT: 18 IU/L (ref 0–32)
AST: 15 IU/L (ref 0–40)
Albumin/Globulin Ratio: 1.4 (ref 1.2–2.2)
Albumin: 4.4 g/dL (ref 3.8–4.8)
Alkaline Phosphatase: 81 IU/L (ref 44–121)
BUN/Creatinine Ratio: 15 (ref 12–28)
BUN: 9 mg/dL (ref 8–27)
Bilirubin Total: <0.2 mg/dL (ref 0.0–1.2)
CO2: 24 mmol/L (ref 20–29)
Calcium: 10 mg/dL (ref 8.7–10.3)
Chloride: 103 mmol/L (ref 96–106)
Creatinine, Ser: 0.6 mg/dL (ref 0.57–1.00)
Globulin, Total: 3.1 g/dL (ref 1.5–4.5)
Glucose: 119 mg/dL — ABNORMAL HIGH (ref 70–99)
Potassium: 4.3 mmol/L (ref 3.5–5.2)
Sodium: 143 mmol/L (ref 134–144)
Total Protein: 7.5 g/dL (ref 6.0–8.5)
eGFR: 101 mL/min/{1.73_m2} (ref >59)

## 2021-05-24 LAB — CBC WITH DIFFERENTIAL/PLATELET
Basophils Absolute: 0.1 10*3/uL (ref 0.0–0.2)
Basos: 1 %
EOS (ABSOLUTE): 0.1 10*3/uL (ref 0.0–0.4)
Eos: 1 %
Hematocrit: 40.2 % (ref 34.0–46.6)
Hemoglobin: 13.3 g/dL (ref 11.1–15.9)
Immature Grans (Abs): 0 10*3/uL (ref 0.0–0.1)
Immature Granulocytes: 0 %
Lymphocytes Absolute: 1.8 10*3/uL (ref 0.7–3.1)
Lymphs: 23 %
MCH: 29.7 pg (ref 26.6–33.0)
MCHC: 33.1 g/dL (ref 31.5–35.7)
MCV: 90 fL (ref 79–97)
Monocytes Absolute: 0.5 10*3/uL (ref 0.1–0.9)
Monocytes: 6 %
Neutrophils Absolute: 5.4 10*3/uL (ref 1.4–7.0)
Neutrophils: 69 %
Platelets: 246 10*3/uL (ref 150–450)
RBC: 4.48 x10E6/uL (ref 3.77–5.28)
RDW: 13.1 % (ref 11.7–15.4)
WBC: 7.8 10*3/uL (ref 3.4–10.8)

## 2021-05-30 LAB — URINE CULTURE

## 2021-06-02 MED ORDER — SULFAMETHOXAZOLE-TRIMETHOPRIM 800-160 MG PO TABS: 1.0000 | ORAL_TABLET | Freq: Two times a day (BID) | ORAL | 0 refills | Status: DC

## 2021-06-02 NOTE — Addendum Note (Signed)
Addended by: Mickel Crow on: 06/02/2021 02:01 PM   Modules accepted: Orders

## 2021-06-04 ENCOUNTER — Other Ambulatory Visit: Payer: Self-pay | Admitting: Physician Assistant

## 2021-06-12 ENCOUNTER — Other Ambulatory Visit: Payer: Self-pay | Admitting: Physician Assistant

## 2021-06-23 DIAGNOSIS — K5900 Constipation, unspecified: Secondary | ICD-10-CM | POA: Diagnosis not present

## 2021-06-23 DIAGNOSIS — K8681 Exocrine pancreatic insufficiency: Secondary | ICD-10-CM | POA: Diagnosis not present

## 2021-06-23 DIAGNOSIS — K219 Gastro-esophageal reflux disease without esophagitis: Secondary | ICD-10-CM | POA: Diagnosis not present

## 2021-06-23 DIAGNOSIS — K589 Irritable bowel syndrome without diarrhea: Secondary | ICD-10-CM | POA: Diagnosis not present

## 2021-07-14 ENCOUNTER — Other Ambulatory Visit: Payer: Self-pay | Admitting: Physician Assistant

## 2021-08-04 ENCOUNTER — Other Ambulatory Visit: Payer: Self-pay | Admitting: Nurse Practitioner

## 2021-08-07 ENCOUNTER — Ambulatory Visit: Payer: BC Managed Care – PPO | Admitting: Nurse Practitioner

## 2021-08-14 ENCOUNTER — Other Ambulatory Visit: Payer: Self-pay

## 2021-08-14 ENCOUNTER — Encounter: Payer: Self-pay | Admitting: Nurse Practitioner

## 2021-08-14 ENCOUNTER — Ambulatory Visit (INDEPENDENT_AMBULATORY_CARE_PROVIDER_SITE_OTHER): Payer: BC Managed Care – PPO | Admitting: Nurse Practitioner

## 2021-08-14 VITALS — BP 113/68 | HR 59 | Temp 97.0°F | Ht 63.0 in | Wt 121.5 lb

## 2021-08-14 DIAGNOSIS — Z23 Encounter for immunization: Secondary | ICD-10-CM | POA: Diagnosis not present

## 2021-08-14 DIAGNOSIS — K219 Gastro-esophageal reflux disease without esophagitis: Secondary | ICD-10-CM | POA: Insufficient documentation

## 2021-08-14 DIAGNOSIS — L209 Atopic dermatitis, unspecified: Secondary | ICD-10-CM | POA: Diagnosis not present

## 2021-08-14 DIAGNOSIS — R911 Solitary pulmonary nodule: Secondary | ICD-10-CM | POA: Insufficient documentation

## 2021-08-14 DIAGNOSIS — E0800 Diabetes mellitus due to underlying condition with hyperosmolarity without nonketotic hyperglycemic-hyperosmolar coma (NKHHC): Secondary | ICD-10-CM | POA: Diagnosis not present

## 2021-08-14 DIAGNOSIS — L700 Acne vulgaris: Secondary | ICD-10-CM | POA: Insufficient documentation

## 2021-08-14 DIAGNOSIS — K8681 Exocrine pancreatic insufficiency: Secondary | ICD-10-CM | POA: Diagnosis not present

## 2021-08-14 DIAGNOSIS — R195 Other fecal abnormalities: Secondary | ICD-10-CM | POA: Insufficient documentation

## 2021-08-14 DIAGNOSIS — E785 Hyperlipidemia, unspecified: Secondary | ICD-10-CM | POA: Diagnosis not present

## 2021-08-14 DIAGNOSIS — Z8619 Personal history of other infectious and parasitic diseases: Secondary | ICD-10-CM | POA: Insufficient documentation

## 2021-08-14 DIAGNOSIS — N941 Unspecified dyspareunia: Secondary | ICD-10-CM | POA: Insufficient documentation

## 2021-08-14 LAB — POCT GLYCOSYLATED HEMOGLOBIN (HGB A1C): Hemoglobin A1C: 6.4 % — AB (ref 4.0–5.6)

## 2021-08-14 MED ORDER — NYSTATIN 100000 UNIT/GM EX OINT: 1.0000 "application " | TOPICAL_OINTMENT | Freq: Two times a day (BID) | CUTANEOUS | 2 refills | Status: DC

## 2021-08-14 MED ORDER — TRIAMCINOLONE ACETONIDE 0.1 % EX OINT: 1.0000 "application " | TOPICAL_OINTMENT | Freq: Two times a day (BID) | CUTANEOUS | 2 refills | Status: DC

## 2021-08-14 NOTE — Progress Notes (Signed)
Established Patient Office Visit  Subjective:  Patient ID: Rebecca Robertson, female    DOB: 1956-04-09  Age: 65 y.o. MRN: [id number]  CC:  Chief Complaint  Patient presents with   Diabetes   Hyperlipidemia    HPI Chevon Fomby presents for follow up of diabetes. She has good management of blood sugars. Her HgbA1c is 6.4 today. She does see and endocrinologist once yearly for diabetic surveillance.  Blood pressure is well controlled. She is seeing GI provider for pancreatic insufficiency.  She mentions chronic skin irritation in vaginal area. She will use nystatin/triamcinolone combination ointment when needed. Insurance is no longer paying for this ointment. She would like to get an alternative treatment that insurance may cover.  She has no other concerns or complaints. She denies chest pain, chest pressure, or shortness of breath. she denies headaches or visual disturbances. she denies abdominal pain, nausea, vomiting, or changes in bowel or bladder habits.    Past Medical History:  Diagnosis Date   Anxiety    Diabetes mellitus    History of breast cancer ONCOLOGIST-- DR Humphrey Rolls--  NO RECURRENCE   S/P LEFT PARTIAL MASTECTOMY W/ SLN BX'S  12-02-2010---  DUCTAL CARCINOMA IN SITU--  S/P RADIATION THERAPY ENDED 02-17-2011   Hyperlipidemia    Personal history of radiation therapy 2012   Pneumonia    PONV (postoperative nausea and vomiting)    SUI (stress urinary incontinence, female)    Vitamin D deficiency    Vitamin D insufficiency 03/04/2016    Past Surgical History:  Procedure Laterality Date   BLADDER SUSPENSION N/A 02/03/2013   Procedure: SPARC PROCEDURE;  Surgeon: Reece Packer, MD;  Location: [place];  Service: Urology;  Laterality: N/A;   BREAST LUMPECTOMY Left 2012   BUNIONECTOMY     CESAREAN SECTION     X4   LEFT HEART CATH AND CORONARY ANGIOGRAPHY N/A 01/26/2018   Procedure: LEFT HEART CATH AND CORONARY ANGIOGRAPHY;  Surgeon: Wellington Hampshire, MD;  Location: [facility];  Service: Cardiovascular;  Laterality: N/A;   MENISCUS REPAIR Left    PARTIAL MASTECTOMY WITH AXILLARY SENTINEL LYMPH NODE BIOPSY Left 12-02-2010    Family History  Problem Relation Age of Onset   Cancer Mother        lung   Heart attack Father    COPD Father    Heart disease Father     Social History   Socioeconomic History   Marital status: Married    Spouse name: Not on file   Number of children: Not on file   Years of education: Not on file   Highest education level: Not on file  Occupational History   Not on file  Tobacco Use   Smoking status: Never   Smokeless tobacco: Never  Vaping Use   Vaping Use: Never used  Substance and Sexual Activity   Alcohol use: No   Drug use: No   Sexual activity: Not on file  Other Topics Concern   Not on file  Social History Narrative   Not on file   Social Determinants of Health   Financial Resource Strain: Not on file  Food Insecurity: Not on file  Transportation Needs: Not on file  Physical Activity: Not on file  Stress: Not on file  Social Connections: Not on file  Intimate Partner Violence: Not on file    Outpatient Medications Prior to Visit  Medication Sig Dispense Refill   albuterol (VENTOLIN HFA) 108 (90  Base) MCG/ACT inhaler INHALE 2 PUFFS INTO THE LUNGS EVERY 6 HOURS AS NEEDED FOR WHEEZING OR SHORTNESS OF BREATH. 8.5 g 1   BAYER CONTOUR NEXT TEST test strip Check blood sugar daily  5   citalopram (CELEXA) 20 MG tablet Take 1 tablet by mouth once daily 90 tablet 0   Fluocinolone Acetonide Body 0.01 % OIL Apply 1 application topically 2 (two) times daily as needed. 4 mL 2   lovastatin (MEVACOR) 40 MG tablet TAKE 1 TABLET BY MOUTH ONCE DAILY 6 IN THE EVENING 90 tablet 1   mupirocin cream (BACTROBAN) 2 % Apply 1 application topically 2 (two) times daily. 15 g 0   oxybutynin (DITROPAN) 5 MG tablet Take 1 tablet by mouth twice daily 180 tablet 1   Semaglutide,0.25 or  0.5MG/DOS, 2 MG/1.5ML SOPN Inject 0.25 mg into the skin every Sunday.     sulfamethoxazole-trimethoprim (BACTRIM DS) 800-160 MG tablet Take 1 tablet by mouth 2 (two) times daily. 6 tablet 0   SYNJARDY 12.12-998 MG TABS Take 1 tablet 2 (two) times daily by mouth.   4   TRULANCE 3 MG TABS Take 1 tablet by mouth daily.     vitamin B-12 (CYANOCOBALAMIN) 1000 MCG tablet Take 1,000 mcg by mouth every other day.     nystatin-triamcinolone ointment (MYCOLOG) Apply 1 application topically 2 (two) times daily as needed. 60 g 0   No facility-administered medications prior to visit.    Allergies  Allergen Reactions   Compazine Other (See Comments)    Possible seizure, unable to talk, eyes rolled toward back of head, mouth became dry.   Dulaglutide Other (See Comments)    GI upset   Sitagliptin-Metformin Hcl Other (See Comments)    GI upset   Tamoxifen Citrate Other (See Comments)   Aspirin Other (See Comments)    bruising   Atorvastatin Other (See Comments)    fatigue   Bydureon [Exenatide]     Severe burning with injection   Liraglutide Other (See Comments)    GI upset    ROS Review of Systems  Constitutional:  Negative for activity change, appetite change, chills, fatigue and fever.  HENT:  Negative for congestion, postnasal drip, rhinorrhea, sinus pressure, sinus pain, sneezing and sore throat.   Eyes: Negative.   Respiratory:  Negative for cough, chest tightness, shortness of breath and wheezing.   Cardiovascular:  Negative for chest pain and palpitations.  Gastrointestinal:  Negative for abdominal pain, constipation, diarrhea, nausea and vomiting.  Endocrine: Negative for cold intolerance, heat intolerance, polydipsia and polyuria.       Blood sugars doing well    Genitourinary:  Negative for dyspareunia, dysuria, flank pain, frequency and urgency.       Has intermittent irritation of the skin of vagina.   Musculoskeletal:  Negative for arthralgias, back pain and myalgias.  Skin:   Negative for rash.  Allergic/Immunologic: Negative for environmental allergies.  Neurological:  Negative for dizziness, weakness and headaches.  Hematological:  Negative for adenopathy.  Psychiatric/Behavioral:  The patient is not nervous/anxious.      Objective:    Physical Exam Vitals and nursing note reviewed.  Constitutional:      Appearance: Normal appearance. She is well-developed.  HENT:     Head: Normocephalic and atraumatic.     Nose: Nose normal.     Mouth/Throat:     Mouth: Mucous membranes are moist.  Eyes:     Extraocular Movements: Extraocular movements intact.     Conjunctiva/sclera: Conjunctivae normal.  Pupils: Pupils are equal, round, and reactive to light.  Cardiovascular:     Rate and Rhythm: Normal rate and regular rhythm.     Pulses: Normal pulses.     Heart sounds: Normal heart sounds.  Pulmonary:     Effort: Pulmonary effort is normal.     Breath sounds: Normal breath sounds.  Abdominal:     Palpations: Abdomen is soft.  Musculoskeletal:        General: Normal range of motion.     Cervical back: Normal range of motion and neck supple.  Lymphadenopathy:     Cervical: No cervical adenopathy.  Skin:    General: Skin is warm and dry.     Capillary Refill: Capillary refill takes less than 2 seconds.  Neurological:     General: No focal deficit present.     Mental Status: She is alert and oriented to person, place, and time.  Psychiatric:        Mood and Affect: Mood normal.        Behavior: Behavior normal.        Thought Content: Thought content normal.        Judgment: Judgment normal.    Today's Vitals   08/14/21 0903  BP: 113/68  Pulse: (!) 59  Temp: (!) 97 F (36.1 C)  SpO2: 99%  Weight: 121 lb 8 oz (55.1 kg)  Height: _0  (1.6 m)   Body mass index is 21.52 kg/m.   Wt Readings from Last 3 Encounters:  08/14/21 121 lb 8 oz (55.1 kg)  05/23/21 119 lb 8 oz (54.2 kg)  02/03/21 120 lb 9.6 oz (54.7 kg)     Health Maintenance  Due  Topic Date Due   HIV Screening  Never done   Hepatitis C Screening  Never done   COVID-19 Vaccine (3 - Pfizer risk series) 09/02/2020    There are no preventive care reminders to display for this patient.  Lab Results  Component Value Date   TSH 1.290 02/03/2021   Lab Results  Component Value Date   WBC 7.8 05/23/2021   HGB 13.3 05/23/2021   HCT 40.2 05/23/2021   MCV 90 05/23/2021   PLT 246 05/23/2021   Lab Results  Component Value Date   NA 143 05/23/2021   K 4.3 05/23/2021   CHLORIDE 108 09/10/2015   CO2 24 05/23/2021   GLUCOSE 119 (H) 05/23/2021   BUN 9 05/23/2021   CREATININE 0.60 05/23/2021   BILITOT <0.2 05/23/2021   ALKPHOS 81 05/23/2021   AST 15 05/23/2021   ALT 18 05/23/2021   PROT 7.5 05/23/2021   ALBUMIN 4.4 05/23/2021   CALCIUM 10.0 05/23/2021   ANIONGAP 11 12/06/2017   EGFR 101 05/23/2021   Lab Results  Component Value Date   CHOL 179 02/03/2021   Lab Results  Component Value Date   HDL 46 02/03/2021   Lab Results  Component Value Date   LDLCALC 104 (H) 02/03/2021   Lab Results  Component Value Date   TRIG 166 (H) 02/03/2021   Lab Results  Component Value Date   CHOLHDL 3.9 02/03/2021   Lab Results  Component Value Date   HGBA1C 6.4 (A) 08/14/2021      Assessment & Plan:  1. Diabetes mellitus due to underlying condition with hyperosmolarity without coma, without long-term current use of insulin (HCC) Good control of blood sugars with HgbA1c at 6.4 today. Continue all diabetic medication as prescribed. Plans to see endocrinology once yearly  -  POCT glycosylated hemoglobin (Hb A1C)  2. Atopic dermatitis, unspecified type Separate prescriptions for nystatin ointment and triamcinolone 0.1% ointment sent to pharmacy. Advised patient to mix these together and apply to effected areas twice daily as needed.  - nystatin ointment (MYCOSTATIN); Apply 1 application topically 2 (two) times daily.  Dispense: 30 g; Refill: 2 - triamcinolone  ointment (KENALOG) 0.1 %; Apply 1 application topically 2 (two) times daily.  Dispense: 30 g; Refill: 2  3. Dyslipidemia Conitnue rosuvastatin as prescribed   4. Exocrine pancreatic insufficiency Continue regular visits with GI provider as scheduled.   5. Need for influenza vaccination Flu vaccine administered today - Flu Vaccine QUAD 6+ mos PF IM (Fluarix Quad PF)   Problem List Items Addressed This Visit       Digestive   Exocrine pancreatic insufficiency     Endocrine   Diabetes ([place]) - Primary (Chronic)   Relevant Orders   POCT glycosylated hemoglobin (Hb A1C) (Completed)     Musculoskeletal and Integument   Contact dermatitis and eczema due to cause (Chronic)   Relevant Medications   nystatin ointment (MYCOSTATIN)   triamcinolone ointment (KENALOG) 0.1 %     Other   Dyslipidemia   Other Visit Diagnoses     Need for influenza vaccination       Relevant Orders   Flu Vaccine QUAD 6+ mos PF IM (Fluarix Quad PF) (Completed)       Meds ordered this encounter  Medications   nystatin ointment (MYCOSTATIN)    Sig: Apply 1 application topically 2 (two) times daily.    Dispense:  30 g    Refill:  2    Order Specific Question:   Supervising Provider    Answer:   Beatrice Lecher D [[id number]]   triamcinolone ointment (KENALOG) 0.1 %    Sig: Apply 1 application topically 2 (two) times daily.    Dispense:  30 g    Refill:  2    Order Specific Question:   Supervising Provider    Answer:   Beatrice Lecher D [[id number]]    Follow-up: Return in about 3 months (around 11/12/2021) for DM, HLD, check HgbA1c.    Ronnell Freshwater, NP

## 2021-08-14 NOTE — Patient Instructions (Signed)

## 2021-10-07 ENCOUNTER — Other Ambulatory Visit: Payer: Self-pay

## 2021-10-07 ENCOUNTER — Ambulatory Visit: Payer: BC Managed Care – PPO | Admitting: Nurse Practitioner

## 2021-10-07 ENCOUNTER — Encounter: Payer: Self-pay | Admitting: Nurse Practitioner

## 2021-10-07 VITALS — BP 107/70 | HR 66 | Temp 98.0°F | Ht 63.0 in | Wt 119.8 lb

## 2021-10-07 DIAGNOSIS — Z8249 Family history of ischemic heart disease and other diseases of the circulatory system: Secondary | ICD-10-CM | POA: Diagnosis not present

## 2021-10-07 DIAGNOSIS — L739 Follicular disorder, unspecified: Secondary | ICD-10-CM

## 2021-10-07 DIAGNOSIS — R55 Syncope and collapse: Secondary | ICD-10-CM | POA: Diagnosis not present

## 2021-10-07 DIAGNOSIS — S20212A Contusion of left front wall of thorax, initial encounter: Secondary | ICD-10-CM | POA: Insufficient documentation

## 2021-10-07 DIAGNOSIS — F513 Sleepwalking [somnambulism]: Secondary | ICD-10-CM | POA: Diagnosis not present

## 2021-10-07 MED ORDER — MUPIROCIN CALCIUM 2 % EX CREA: 1.0000 "application " | TOPICAL_CREAM | Freq: Two times a day (BID) | CUTANEOUS | 1 refills | Status: DC

## 2021-10-07 NOTE — Progress Notes (Signed)
Established patient visit   Patient: Rebecca Robertson   DOB: [birth year]   66 y.o. Female  MRN: [id number] Visit Date: 10/07/2021  Chief Complaint  Patient presents with   Fall   Subjective    HPI  The patient states that for the past month, she has been having episodes of sleep walking for the past month.  During a recent episode, she fell, hitting her left side on her grandson's bed. She has bruised the left ribs. She states that she has had four or five episodes of sleep walking this past month. She thinks she must be getting up to do something and then losing consciousness. She is not taking any new medications. Has significant family history of brain aneurysms. She is concerned about this. She denies dizziness or vertigo, palpitations, or syncope during the day while awake.  Has noted some small, red, tender skin lesions, like small infected hair follicles on her chest, abdomen, and back.    Medications: Outpatient Medications Prior to Visit  Medication Sig   albuterol (VENTOLIN HFA) 108 (90 Base) MCG/ACT inhaler INHALE 2 PUFFS INTO THE LUNGS EVERY 6 HOURS AS NEEDED FOR WHEEZING OR SHORTNESS OF BREATH.   BAYER CONTOUR NEXT TEST test strip Check blood sugar daily   citalopram (CELEXA) 20 MG tablet Take 1 tablet by mouth once daily   Fluocinolone Acetonide Body 0.01 % OIL Apply 1 application topically 2 (two) times daily as needed.   lovastatin (MEVACOR) 40 MG tablet TAKE 1 TABLET BY MOUTH ONCE DAILY 6 IN THE EVENING   nystatin ointment (MYCOSTATIN) Apply 1 application topically 2 (two) times daily.   oxybutynin (DITROPAN) 5 MG tablet Take 1 tablet by mouth twice daily   Semaglutide,0.25 or 0.5MG /DOS, 2 MG/1.5ML SOPN Inject 0.25 mg into the skin every Sunday.   sulfamethoxazole-trimethoprim (BACTRIM DS) 800-160 MG tablet Take 1 tablet by mouth 2 (two) times daily.   SYNJARDY 12.12-998 MG TABS Take 1 tablet 2 (two) times daily by mouth.    triamcinolone ointment (KENALOG) 0.1 % Apply  1 application topically 2 (two) times daily.   TRULANCE 3 MG TABS Take 1 tablet by mouth daily.   vitamin B-12 (CYANOCOBALAMIN) 1000 MCG tablet Take 1,000 mcg by mouth every other day.   [DISCONTINUED] mupirocin cream (BACTROBAN) 2 % Apply 1 application topically 2 (two) times daily.   No facility-administered medications prior to visit.    Review of Systems  Constitutional:  Negative for activity change, appetite change, chills, fatigue and fever.  HENT:  Negative for congestion, postnasal drip, rhinorrhea, sinus pressure, sinus pain, sneezing and sore throat.   Eyes: Negative.   Respiratory:  Negative for cough, chest tightness, shortness of breath and wheezing.   Cardiovascular:  Negative for chest pain and palpitations.  Gastrointestinal:  Negative for abdominal pain, constipation, diarrhea, nausea and vomiting.  Endocrine: Negative for cold intolerance, heat intolerance, polydipsia and polyuria.  Genitourinary:  Negative for dyspareunia, dysuria, flank pain, frequency and urgency.  Musculoskeletal:  Positive for arthralgias. Negative for back pain and myalgias.       Has significant left rib pain with bruising after a fall two nights ago.   Skin:  Negative for rash.  Allergic/Immunologic: Negative for environmental allergies.  Neurological:  Negative for dizziness, weakness and headaches.       Having very vivid dreams during the night. Has noted sleep walking episodes. Difficult to tell if she is awake or asleep. Has had syncopal episodes during these events. Have occurred four or  five times over the last one month.   Hematological:  Negative for adenopathy.  Psychiatric/Behavioral:  The patient is not nervous/anxious.     Objective     Today's Vitals   10/07/21 1111  BP: 107/70  Pulse: 66  Temp: 98 F (36.7 C)  SpO2: 98%  Weight: 119 lb 12.8 oz (54.3 kg)  Height: 5\' 3"  (1.6 m)   Body mass index is 21.22 kg/m.   Physical Exam Vitals and nursing note reviewed.   Constitutional:      Appearance: Normal appearance. She is well-developed.  HENT:     Head: Normocephalic and atraumatic.  Eyes:     Pupils: Pupils are equal, round, and reactive to light.  Cardiovascular:     Rate and Rhythm: Normal rate and regular rhythm.     Pulses: Normal pulses.     Heart sounds: Normal heart sounds.  Pulmonary:     Effort: Pulmonary effort is normal.     Breath sounds: Normal breath sounds.  Chest:     Chest wall: Swelling present.       Comments: The patient does have bruising over the left rib cage along the mid axillary line. This stretches from the 4th rib to about the 8th rib. Tenderness with guarding with light palpation of the effected area. Mild swelling present.  Abdominal:     Palpations: Abdomen is soft.  Musculoskeletal:        General: Normal range of motion.     Cervical back: Normal range of motion and neck supple.  Lymphadenopathy:     Cervical: No cervical adenopathy.  Skin:    General: Skin is warm and dry.     Capillary Refill: Capillary refill takes less than 2 seconds.  Neurological:     General: No focal deficit present.     Mental Status: She is alert and oriented to person, place, and time.     Cranial Nerves: No cranial nerve deficit.     Sensory: No sensory deficit.     Motor: No weakness.     Coordination: Coordination normal.     Gait: Gait normal.  Psychiatric:        Mood and Affect: Mood normal.        Behavior: Behavior normal.        Thought Content: Thought content normal.        Judgment: Judgment normal.     Assessment & Plan     1. Syncope, unspecified syncope type Patient having syncopal episodes during sleep. Will get MRI for further evaluation.  - MR Brain Wo Contrast; Future  2. Sleep walking disorder Patient getting up and walking during sleep. Unsure if she wakes up prior to losing consciousness again and falling. Will get MRI of the brain for further evaluation.  - MR Brain Wo Contrast;  Future  3. Family history of cerebral aneurysm Several family members with cerebral aneurysms. Will get MRI for further evaluation.   4. Hair follicle infection May apply bactroban ointment to effected areas twice daily as needed.  - mupirocin cream (BACTROBAN) 2 %; Apply 1 application topically 2 (two) times daily.  Dispense: 15 g; Refill: 1   5. Bruised ribs, left, initial encounter  Burised ribs after all at home  during episode of sleep walking or syncope. Discussed splinting techniques for coughing and sneezing. Advised her to apply ice as needed. May take OTC tylenol or ibuprofen for pain and swelling   Return for prn worsening or persistent symptoms,  as scheduled.        Ronnell Freshwater, NP  [facility] [phone] (phone) [phone] (fax)  [place]

## 2021-10-08 ENCOUNTER — Other Ambulatory Visit: Payer: Self-pay | Admitting: Physician Assistant

## 2021-10-13 ENCOUNTER — Telehealth: Payer: Self-pay | Admitting: Nurse Practitioner

## 2021-10-13 NOTE — Telephone Encounter (Signed)
Spoke with [employer]. Reference number [id number]   The PA has been denied and will require an appeal. Advised provider can contact the number on the back of the card. AS, CMA

## 2021-10-22 ENCOUNTER — Telehealth: Payer: Self-pay | Admitting: Nurse Practitioner

## 2021-10-22 NOTE — Telephone Encounter (Signed)
Patient is supposed to have mri/ct scan in the morning however insurance has denied it and she needs to know whether or not to keep the appointment? ?

## 2021-10-22 NOTE — Telephone Encounter (Signed)
Patient is aware 

## 2021-10-22 NOTE — Telephone Encounter (Signed)
I would have her reschedule it. I still have to call insurance for peer to peer.

## 2021-10-23 ENCOUNTER — Other Ambulatory Visit: Payer: BC Managed Care – PPO

## 2021-11-05 ENCOUNTER — Other Ambulatory Visit: Payer: Self-pay | Admitting: Nurse Practitioner

## 2021-11-05 DIAGNOSIS — Z8249 Family history of ischemic heart disease and other diseases of the circulatory system: Secondary | ICD-10-CM

## 2021-11-05 DIAGNOSIS — F513 Sleepwalking [somnambulism]: Secondary | ICD-10-CM

## 2021-11-05 DIAGNOSIS — R55 Syncope and collapse: Secondary | ICD-10-CM

## 2021-11-05 NOTE — Telephone Encounter (Signed)
Please let the patient know that I have done a referral to neurology for her as I have had difficulty getting MRI approved. Thanks so much.   -HB

## 2021-11-05 NOTE — Telephone Encounter (Signed)
Called pt she is advised of her referral for Neurology  ?

## 2021-11-05 NOTE — Progress Notes (Signed)
Have had trouble getting MRI approved. I have done referral to neurology for further evaluation.  ?

## 2021-11-14 ENCOUNTER — Other Ambulatory Visit (HOSPITAL_BASED_OUTPATIENT_CLINIC_OR_DEPARTMENT_OTHER): Payer: Self-pay

## 2021-11-14 ENCOUNTER — Emergency Department (HOSPITAL_BASED_OUTPATIENT_CLINIC_OR_DEPARTMENT_OTHER): Payer: BC Managed Care – PPO

## 2021-11-14 ENCOUNTER — Other Ambulatory Visit: Payer: Self-pay

## 2021-11-14 ENCOUNTER — Emergency Department (HOSPITAL_BASED_OUTPATIENT_CLINIC_OR_DEPARTMENT_OTHER)
Admission: EM | Admit: 2021-11-14 | Discharge: 2021-11-14 | Disposition: A | Discharge status: Home / Self Care | Payer: BC Managed Care – PPO | Attending: Emergency Medicine | Admitting: Emergency Medicine

## 2021-11-14 ENCOUNTER — Encounter (HOSPITAL_BASED_OUTPATIENT_CLINIC_OR_DEPARTMENT_OTHER): Payer: Self-pay | Admitting: Emergency Medicine

## 2021-11-14 DIAGNOSIS — I700 Atherosclerosis of aorta: Secondary | ICD-10-CM | POA: Diagnosis not present

## 2021-11-14 DIAGNOSIS — R197 Diarrhea, unspecified: Secondary | ICD-10-CM | POA: Diagnosis not present

## 2021-11-14 DIAGNOSIS — K760 Fatty (change of) liver, not elsewhere classified: Secondary | ICD-10-CM | POA: Diagnosis not present

## 2021-11-14 DIAGNOSIS — R1032 Left lower quadrant pain: Secondary | ICD-10-CM | POA: Insufficient documentation

## 2021-11-14 DIAGNOSIS — E119 Type 2 diabetes mellitus without complications: Secondary | ICD-10-CM | POA: Diagnosis not present

## 2021-11-14 DIAGNOSIS — N281 Cyst of kidney, acquired: Secondary | ICD-10-CM | POA: Diagnosis not present

## 2021-11-14 HISTORY — DX: Family history of other diseases of the digestive system: Z83.79

## 2021-11-14 LAB — URINALYSIS, ROUTINE W REFLEX MICROSCOPIC
Bilirubin Urine: NEGATIVE
Glucose, UA: >1000 mg/dL — AB
Hgb urine dipstick: NEGATIVE
Ketones, ur: NEGATIVE mg/dL
Leukocytes,Ua: NEGATIVE
Nitrite: NEGATIVE
Specific Gravity, Urine: >1.046 — ABNORMAL HIGH (ref 1.005–1.030)
pH: 5.5 (ref 5.0–8.0)

## 2021-11-14 LAB — CBC
HCT: 41.9 % (ref 36.0–46.0)
Hemoglobin: 13.4 g/dL (ref 12.0–15.0)
MCH: 28.3 pg (ref 26.0–34.0)
MCHC: 32 g/dL (ref 30.0–36.0)
MCV: 88.6 fL (ref 80.0–100.0)
Platelets: 265 10*3/uL (ref 150–400)
RBC: 4.73 MIL/uL (ref 3.87–5.11)
RDW: 13.2 % (ref 11.5–15.5)
WBC: 7.6 10*3/uL (ref 4.0–10.5)
nRBC: 0 % (ref 0.0–0.2)

## 2021-11-14 LAB — COMPREHENSIVE METABOLIC PANEL
ALT: 16 U/L (ref 0–44)
AST: 19 U/L (ref 15–41)
Albumin: 4.6 g/dL (ref 3.5–5.0)
Alkaline Phosphatase: 88 U/L (ref 38–126)
Anion gap: 12 (ref 5–15)
BUN: 14 mg/dL (ref 8–23)
CO2: 26 mmol/L (ref 22–32)
Calcium: 10.6 mg/dL — ABNORMAL HIGH (ref 8.9–10.3)
Chloride: 102 mmol/L (ref 98–111)
Creatinine, Ser: 0.7 mg/dL (ref 0.44–1.00)
GFR, Estimated: >60 mL/min (ref >60)
Glucose, Bld: 177 mg/dL — ABNORMAL HIGH (ref 70–99)
Potassium: 3.7 mmol/L (ref 3.5–5.1)
Sodium: 140 mmol/L (ref 135–145)
Total Bilirubin: 0.4 mg/dL (ref 0.3–1.2)
Total Protein: 8.4 g/dL — ABNORMAL HIGH (ref 6.5–8.1)

## 2021-11-14 LAB — LIPASE, BLOOD: Lipase: 55 U/L — ABNORMAL HIGH (ref 11–51)

## 2021-11-14 MED ORDER — ACETAMINOPHEN 500 MG PO TABS
1000.0000 mg | ORAL_TABLET | Freq: Four times a day (QID) | ORAL | 0 refills | Status: DC | PRN
Start: 2021-11-14 — End: 2024-03-21
  Filled 2021-11-14: qty 30, 4d supply, fill #0

## 2021-11-14 MED ORDER — LACTATED RINGERS IV BOLUS
1000.0000 mL | Freq: Once | INTRAVENOUS | Status: AC
Start: 2021-11-14 — End: 2021-11-14
  Administered 2021-11-14: 1000 mL via INTRAVENOUS

## 2021-11-14 MED ORDER — ONDANSETRON HCL 4 MG/2ML IJ SOLN
4.0000 mg | Freq: Once | INTRAMUSCULAR | Status: AC | PRN
  Administered 2021-11-14: 4 mg via INTRAVENOUS
  Filled 2021-11-14: qty 2

## 2021-11-14 MED ORDER — MORPHINE SULFATE (PF) 4 MG/ML IV SOLN
4.0000 mg | Freq: Once | INTRAVENOUS | Status: AC
  Administered 2021-11-14: 4 mg via INTRAVENOUS
  Filled 2021-11-14: qty 1

## 2021-11-14 MED ORDER — IOHEXOL 300 MG/ML  SOLN
100.0000 mL | Freq: Once | INTRAMUSCULAR | Status: AC | PRN
  Administered 2021-11-14: 80 mL via INTRAVENOUS

## 2021-11-14 MED ORDER — ONDANSETRON 4 MG PO TBDP
4.0000 mg | ORAL_TABLET | Freq: Three times a day (TID) | ORAL | 0 refills | Status: DC | PRN
  Filled 2021-11-14: qty 20, 7d supply, fill #0

## 2021-11-14 NOTE — Discharge Instructions (Addendum)
Please follow-up with your gastroenterologist.  Drink plenty of water, I recommend starting with a full liquid diet and moving to bland foods such as bananas, applesauce, toast, rice, avocado and then titrating back to your normal diet.  Make sure you are drinking plenty of water. ? ?Continue taking the Bentyl that you are prescribed as prescribed by your gastroenterologist and follow-up with your gastroenterologist.  You may always return to the ER for any new or concerning symptoms. ? ?I have attached a copy of your CT scan for your review and for your records. ?

## 2021-11-14 NOTE — ED Provider Notes (Signed)
?[name] ?Provider Note ? ? ?CSN: [id number] ?Arrival date & time: 11/14/21  1211 ? ?  ? ?History ? ?Chief Complaint  ?Patient presents with  ? Abdominal Pain  ? ? ?Rebecca Robertson is a 66 y.o. female. ? ? ?Abdominal Pain ?Patient is a 66 year old female with a past medical history significant for exocrine pancreatic insufficiency, abdominal pain, IBS, chronic constipation, DM2, anxiety, reflux ? ?She is presented emergency room today with complaints of abdominal pain.  She says her abdominal pain has been constant achy and began this morning although she might of felt some discomfort last night.  She states that over the past 3 days she has been having numerous episodes of diarrhea.  She states she normally suffers from oscillation between constipation and diarrhea but states that it has been more diarrhea and pain ? ? ? ?  ? ?Home Medications ?Prior to Admission medications   ?Medication Sig Start Date End Date Taking? Authorizing Provider  ?acetaminophen (TYLENOL) 500 MG tablet Take 2 tablets (1,000 mg total) by mouth every 6 (six) hours as needed. 11/14/21  Yes Tedd Sias, PA  ?ondansetron (ZOFRAN-ODT) 4 MG disintegrating tablet Take 1 tablet (4 mg total) by mouth every 8 (eight) hours as needed for nausea or vomiting. 11/14/21  Yes Drezden Seitzinger S, PA  ?albuterol (VENTOLIN HFA) 108 (90 Base) MCG/ACT inhaler INHALE 2 PUFFS INTO THE LUNGS EVERY 6 HOURS AS NEEDED FOR WHEEZING OR SHORTNESS OF BREATH. 01/08/20   Lorrene Reid, PA-C  ?BAYER CONTOUR NEXT TEST test strip Check blood sugar daily 01/18/16   [provider]  ?citalopram (CELEXA) 20 MG tablet Take 1 tablet by mouth once daily 10/08/21   Lorrene Reid, PA-C  ?Fluocinolone Acetonide Body 0.01 % OIL Apply 1 application topically 2 (two) times daily as needed. 02/02/18   Danford, Valetta Fuller D, NP  ?lovastatin (MEVACOR) 40 MG tablet TAKE 1 TABLET BY MOUTH ONCE DAILY 6 IN THE EVENING 08/05/21   Ronnell Freshwater, NP   ?mupirocin cream (BACTROBAN) 2 % Apply 1 application topically 2 (two) times daily. 10/07/21   Ronnell Freshwater, NP  ?nystatin ointment (MYCOSTATIN) Apply 1 application topically 2 (two) times daily. 08/14/21   Ronnell Freshwater, NP  ?oxybutynin (DITROPAN) 5 MG tablet Take 1 tablet by mouth twice daily 06/12/21   Ronnell Freshwater, NP  ?Semaglutide,0.25 or 0.'5MG'$ /DOS, 2 MG/1.5ML SOPN Inject 0.25 mg into the skin every Sunday.    [provider]  ?sulfamethoxazole-trimethoprim (BACTRIM DS) 800-160 MG tablet Take 1 tablet by mouth 2 (two) times daily. 06/02/21   Abonza, Maritza, PA-C  ?SYNJARDY 12.12-998 MG TABS Take 1 tablet 2 (two) times daily by mouth.  08/26/15   [provider]  ?triamcinolone ointment (KENALOG) 0.1 % Apply 1 application topically 2 (two) times daily. 08/14/21   Ronnell Freshwater, NP  ?TRULANCE 3 MG TABS Take 1 tablet by mouth daily. 04/30/21   [provider]  ?vitamin B-12 (CYANOCOBALAMIN) 1000 MCG tablet Take 1,000 mcg by mouth every other day.    [provider]  ?   ? ?Allergies    ?Compazine, Dulaglutide, Sitagliptin-metformin hcl, Tamoxifen citrate, Aspirin, Atorvastatin, Bydureon [exenatide], and Liraglutide   ? ?Review of Systems   ?Review of Systems  ?Gastrointestinal:  Positive for abdominal pain.  ? ?Physical Exam ?Updated Vital Signs ?BP (!) 152/80   Pulse 62   Temp 98 ?F (36.7 ?C) (Oral)   Resp 16   Ht '5\' 3"'$  (1.6 m)  Wt 52.2 kg   SpO2 100%   BMI 20.37 kg/m?  ?Physical Exam ?Vitals and nursing note reviewed.  ?Constitutional:   ?   General: She is in acute distress.  ?HENT:  ?   Head: Normocephalic and atraumatic.  ?   Nose: Nose normal.  ?   Mouth/Throat:  ?   Mouth: Mucous membranes are dry.  ?Eyes:  ?   General: No scleral icterus. ?Cardiovascular:  ?   Rate and Rhythm: Normal rate and regular rhythm.  ?   Pulses: Normal pulses.  ?   Heart sounds: Normal heart sounds.  ?Pulmonary:  ?   Effort: Pulmonary effort is normal. No respiratory  distress.  ?   Breath sounds: No wheezing.  ?Abdominal:  ?   Palpations: Abdomen is soft.  ?   Tenderness: There is abdominal tenderness. There is no right CVA tenderness, left CVA tenderness, guarding or rebound.  ?   Comments: Left lower quadrant abdominal tenderness.  No guarding or rebound.  ?Musculoskeletal:  ?   Cervical back: Normal range of motion.  ?   Right lower leg: No edema.  ?   Left lower leg: No edema.  ?Skin: ?   General: Skin is warm and dry.  ?   Capillary Refill: Capillary refill takes less than 2 seconds.  ?Neurological:  ?   Mental Status: She is alert. Mental status is at baseline.  ?Psychiatric:     ?   Mood and Affect: Mood normal.     ?   Behavior: Behavior normal.  ? ? ?ED Results / Procedures / Treatments   ?Labs ?(all labs ordered are listed, but only abnormal results are displayed) ?Labs Reviewed  ?LIPASE, BLOOD - Abnormal; Notable for the following components:  ?    Result Value  ? Lipase 55 (*)   ? All other components within normal limits  ?COMPREHENSIVE METABOLIC PANEL - Abnormal; Notable for the following components:  ? Glucose, Bld 177 (*)   ? Calcium 10.6 (*)   ? Total Protein 8.4 (*)   ? All other components within normal limits  ?URINALYSIS, ROUTINE W REFLEX MICROSCOPIC - Abnormal; Notable for the following components:  ? Specific Gravity, Urine >1.046 (*)   ? Glucose, UA >1,000 (*)   ? Protein, ur TRACE (*)   ? All other components within normal limits  ?CBC  ? ? ?EKG ?None ? ?Radiology ?CT ABDOMEN PELVIS W CONTRAST ? ?Result Date: 11/14/2021 ?CLINICAL DATA:  LEFT lower quadrant abdominal pain, concern for diverticulitis. Diarrhea, tenderness, history pancreatic dysfunction EXAM: CT ABDOMEN AND PELVIS WITH CONTRAST TECHNIQUE: Multidetector CT imaging of the abdomen and pelvis was performed using the standard protocol following bolus administration of intravenous contrast. RADIATION DOSE REDUCTION: This exam was performed according to the departmental dose-optimization program  which includes automated exposure control, adjustment of the mA and/or kV according to patient size and/or use of iterative reconstruction technique. CONTRAST:  68m OMNIPAQUE IOHEXOL 300 MG/ML SOLN IV. No oral contrast. COMPARISON:  01/02/2020 FINDINGS: Lower chest: Lung bases clear Hepatobiliary: Fatty infiltration of liver. Gallbladder unremarkable. No focal hepatic abnormalities. Pancreas: Normal appearance Spleen: Calcified granulomata within spleen. Spleen otherwise normal. Adrenals/Urinary Tract: Adrenal glands and LEFT kidney normal appearance. Small cyst laterally RIGHT kidney 12 mm diameter; no follow-up imaging recommended. No hydronephrosis, urinary tract calcification or ureteral dilatation. Bladder unremarkable. Stomach/Bowel: Normal appendix. Stomach and bowel loops normal appearance Vascular/Lymphatic: Scattered pelvic phleboliths. Atherosclerotic calcifications aorta, iliac arteries, proximal visceral arteries. Aorta normal caliber. No  adenopathy. Reproductive: Unremarkable uterus and adnexa Other: No free air or free fluid. No hernia or inflammatory process. Musculoskeletal: Osseous demineralization. IMPRESSION: Fatty infiltration of liver. No acute intra-abdominal or intrapelvic abnormalities. Aortic Atherosclerosis (ICD10-I70.0). Electronically Signed   By: Lavonia Dana M.D.   On: 11/14/2021 13:59   ? ?Procedures ?Procedures  ? ? ?Medications Ordered in ED ?Medications  ?ondansetron (ZOFRAN) injection 4 mg (4 mg Intravenous Given 11/14/21 1303)  ?lactated ringers bolus 1,000 mL (0 mLs Intravenous Stopped 11/14/21 1532)  ?morphine (PF) 4 MG/ML injection 4 mg (4 mg Intravenous Given 11/14/21 1303)  ?iohexol (OMNIPAQUE) 300 MG/ML solution 100 mL (80 mLs Intravenous Contrast Given 11/14/21 1342)  ?morphine (PF) 4 MG/ML injection 4 mg (4 mg Intravenous Given 11/14/21 1530)  ? ? ?ED Course/ Medical Decision Making/ A&P ?Clinical Course as of 11/14/21 1633  ?Fri Nov 14, 2021  ?1234 Hx of pancreatic  insufficiency (a few months ago was dx).  ? ?States has trouble defecating (constipation) but then will develop diarrhea as well.  ?Felt "terrible" Wednesday night - she states that her diarrhea was irritating her anus. Today

## 2021-11-14 NOTE — ED Triage Notes (Signed)
States has had abd pain that start on wed , has had diarrhea , has hx of pancreatic insuff ?

## 2021-11-20 DIAGNOSIS — F411 Generalized anxiety disorder: Secondary | ICD-10-CM | POA: Diagnosis not present

## 2021-11-20 DIAGNOSIS — K8681 Exocrine pancreatic insufficiency: Secondary | ICD-10-CM | POA: Diagnosis not present

## 2021-11-20 DIAGNOSIS — K589 Irritable bowel syndrome without diarrhea: Secondary | ICD-10-CM | POA: Diagnosis not present

## 2021-12-04 ENCOUNTER — Other Ambulatory Visit: Payer: Self-pay | Admitting: Family Medicine

## 2021-12-04 DIAGNOSIS — Z1231 Encounter for screening mammogram for malignant neoplasm of breast: Secondary | ICD-10-CM

## 2021-12-23 DIAGNOSIS — K589 Irritable bowel syndrome without diarrhea: Secondary | ICD-10-CM | POA: Diagnosis not present

## 2021-12-23 DIAGNOSIS — K8681 Exocrine pancreatic insufficiency: Secondary | ICD-10-CM | POA: Diagnosis not present

## 2021-12-23 DIAGNOSIS — R630 Anorexia: Secondary | ICD-10-CM | POA: Diagnosis not present

## 2021-12-23 DIAGNOSIS — R110 Nausea: Secondary | ICD-10-CM | POA: Diagnosis not present

## 2022-01-01 ENCOUNTER — Other Ambulatory Visit: Payer: Self-pay | Admitting: Nurse Practitioner

## 2022-01-12 ENCOUNTER — Other Ambulatory Visit: Payer: Self-pay | Admitting: Physician Assistant

## 2022-01-28 DIAGNOSIS — N952 Postmenopausal atrophic vaginitis: Secondary | ICD-10-CM | POA: Diagnosis not present

## 2022-01-28 DIAGNOSIS — N898 Other specified noninflammatory disorders of vagina: Secondary | ICD-10-CM | POA: Diagnosis not present

## 2022-01-28 DIAGNOSIS — N941 Unspecified dyspareunia: Secondary | ICD-10-CM | POA: Diagnosis not present

## 2022-01-28 DIAGNOSIS — N760 Acute vaginitis: Secondary | ICD-10-CM | POA: Diagnosis not present

## 2022-02-02 ENCOUNTER — Institutional Professional Consult (permissible substitution): Payer: Self-pay | Admitting: Neurology

## 2022-02-02 ENCOUNTER — Ambulatory Visit
Admission: RE | Admit: 2022-02-02 | Discharge: 2022-02-02 | Disposition: A | Discharge status: Home / Self Care | Payer: BC Managed Care – PPO | Source: Ambulatory Visit | Attending: Family Medicine | Admitting: Family Medicine

## 2022-02-02 DIAGNOSIS — Z1231 Encounter for screening mammogram for malignant neoplasm of breast: Secondary | ICD-10-CM | POA: Diagnosis not present

## 2022-02-09 ENCOUNTER — Ambulatory Visit (INDEPENDENT_AMBULATORY_CARE_PROVIDER_SITE_OTHER): Payer: BC Managed Care – PPO | Admitting: Nurse Practitioner

## 2022-02-09 ENCOUNTER — Other Ambulatory Visit (HOSPITAL_COMMUNITY)
Admission: RE | Admit: 2022-02-09 | Discharge: 2022-02-09 | Disposition: A | Discharge status: Home / Self Care | Payer: BC Managed Care – PPO | Source: Ambulatory Visit | Attending: Nurse Practitioner | Admitting: Nurse Practitioner

## 2022-02-09 ENCOUNTER — Encounter: Payer: Self-pay | Admitting: Nurse Practitioner

## 2022-02-09 VITALS — BP 106/67 | HR 58 | Temp 98.0°F | Ht 62.99 in | Wt 128.1 lb

## 2022-02-09 DIAGNOSIS — E538 Deficiency of other specified B group vitamins: Secondary | ICD-10-CM | POA: Diagnosis not present

## 2022-02-09 DIAGNOSIS — Z0000 Encounter for general adult medical examination without abnormal findings: Secondary | ICD-10-CM

## 2022-02-09 DIAGNOSIS — E782 Mixed hyperlipidemia: Secondary | ICD-10-CM | POA: Diagnosis not present

## 2022-02-09 DIAGNOSIS — E119 Type 2 diabetes mellitus without complications: Secondary | ICD-10-CM | POA: Diagnosis not present

## 2022-02-09 DIAGNOSIS — E559 Vitamin D deficiency, unspecified: Secondary | ICD-10-CM | POA: Diagnosis not present

## 2022-02-09 DIAGNOSIS — Z01419 Encounter for gynecological examination (general) (routine) without abnormal findings: Secondary | ICD-10-CM

## 2022-02-09 LAB — POCT GLYCOSYLATED HEMOGLOBIN (HGB A1C): Hemoglobin A1C: 7.6 % — AB (ref 4.0–5.6)

## 2022-02-09 MED ORDER — LOVASTATIN 40 MG PO TABS: ORAL_TABLET | ORAL | 3 refills | Status: DC

## 2022-02-10 LAB — COMPREHENSIVE METABOLIC PANEL
ALT: 17 IU/L (ref 0–32)
AST: 19 IU/L (ref 0–40)
Albumin/Globulin Ratio: 1.5 (ref 1.2–2.2)
Albumin: 4.2 g/dL (ref 3.8–4.8)
Alkaline Phosphatase: 106 IU/L (ref 44–121)
BUN/Creatinine Ratio: 20 (ref 12–28)
BUN: 11 mg/dL (ref 8–27)
Bilirubin Total: <0.2 mg/dL (ref 0.0–1.2)
CO2: 22 mmol/L (ref 20–29)
Calcium: 9.3 mg/dL (ref 8.7–10.3)
Chloride: 106 mmol/L (ref 96–106)
Creatinine, Ser: 0.55 mg/dL — ABNORMAL LOW (ref 0.57–1.00)
Globulin, Total: 2.8 g/dL (ref 1.5–4.5)
Glucose: 159 mg/dL — ABNORMAL HIGH (ref 70–99)
Potassium: 4.5 mmol/L (ref 3.5–5.2)
Sodium: 141 mmol/L (ref 134–144)
Total Protein: 7 g/dL (ref 6.0–8.5)
eGFR: 102 mL/min/{1.73_m2} (ref >59)

## 2022-02-10 LAB — CBC
Hematocrit: 38 % (ref 34.0–46.6)
Hemoglobin: 13 g/dL (ref 11.1–15.9)
MCH: 30.4 pg (ref 26.6–33.0)
MCHC: 34.2 g/dL (ref 31.5–35.7)
MCV: 89 fL (ref 79–97)
Platelets: 209 10*3/uL (ref 150–450)
RBC: 4.28 x10E6/uL (ref 3.77–5.28)
RDW: 13.5 % (ref 11.7–15.4)
WBC: 6.2 10*3/uL (ref 3.4–10.8)

## 2022-02-10 LAB — CYTOLOGY - PAP
Comment: NEGATIVE
Diagnosis: NEGATIVE
High risk HPV: NEGATIVE

## 2022-02-10 LAB — LIPID PANEL
Chol/HDL Ratio: 4.1 ratio (ref 0.0–4.4)
Cholesterol, Total: 170 mg/dL (ref 100–199)
HDL: 41 mg/dL (ref >39)
LDL Chol Calc (NIH): 91 mg/dL (ref 0–99)
Triglycerides: 228 mg/dL — ABNORMAL HIGH (ref 0–149)
VLDL Cholesterol Cal: 38 mg/dL (ref 5–40)

## 2022-02-10 LAB — TSH+FREE T4
Free T4: 0.96 ng/dL (ref 0.82–1.77)
TSH: 1.24 u[IU]/mL (ref 0.450–4.500)

## 2022-02-10 LAB — VITAMIN B12: Vitamin B-12: 858 pg/mL (ref 232–1245)

## 2022-02-10 LAB — VITAMIN D 25 HYDROXY (VIT D DEFICIENCY, FRACTURES): Vit D, 25-Hydroxy: 37.3 ng/mL (ref 30.0–100.0)

## 2022-03-16 DIAGNOSIS — K8681 Exocrine pancreatic insufficiency: Secondary | ICD-10-CM | POA: Diagnosis not present

## 2022-03-16 DIAGNOSIS — K589 Irritable bowel syndrome without diarrhea: Secondary | ICD-10-CM | POA: Diagnosis not present

## 2022-03-16 DIAGNOSIS — K5900 Constipation, unspecified: Secondary | ICD-10-CM | POA: Diagnosis not present

## 2022-03-16 DIAGNOSIS — K219 Gastro-esophageal reflux disease without esophagitis: Secondary | ICD-10-CM | POA: Diagnosis not present

## 2022-03-23 DIAGNOSIS — E1165 Type 2 diabetes mellitus with hyperglycemia: Secondary | ICD-10-CM | POA: Diagnosis not present

## 2022-03-23 DIAGNOSIS — Z8639 Personal history of other endocrine, nutritional and metabolic disease: Secondary | ICD-10-CM | POA: Diagnosis not present

## 2022-03-23 DIAGNOSIS — K8681 Exocrine pancreatic insufficiency: Secondary | ICD-10-CM | POA: Diagnosis not present

## 2022-04-06 ENCOUNTER — Other Ambulatory Visit: Payer: Self-pay | Admitting: Nurse Practitioner

## 2022-04-26 ENCOUNTER — Encounter (HOSPITAL_BASED_OUTPATIENT_CLINIC_OR_DEPARTMENT_OTHER): Payer: Self-pay

## 2022-04-26 ENCOUNTER — Emergency Department (HOSPITAL_BASED_OUTPATIENT_CLINIC_OR_DEPARTMENT_OTHER)
Admission: EM | Admit: 2022-04-26 | Discharge: 2022-04-26 | Disposition: A | Discharge status: Home / Self Care | Payer: BC Managed Care – PPO | Attending: Emergency Medicine | Admitting: Emergency Medicine

## 2022-04-26 ENCOUNTER — Emergency Department (HOSPITAL_BASED_OUTPATIENT_CLINIC_OR_DEPARTMENT_OTHER): Payer: BC Managed Care – PPO

## 2022-04-26 ENCOUNTER — Other Ambulatory Visit: Payer: Self-pay

## 2022-04-26 ENCOUNTER — Ambulatory Visit
Admission: EM | Admit: 2022-04-26 | Discharge: 2022-04-26 | Disposition: A | Discharge status: Home / Self Care | Payer: BC Managed Care – PPO

## 2022-04-26 DIAGNOSIS — S0993XA Unspecified injury of face, initial encounter: Secondary | ICD-10-CM

## 2022-04-26 DIAGNOSIS — W19XXXA Unspecified fall, initial encounter: Secondary | ICD-10-CM

## 2022-04-26 DIAGNOSIS — H05232 Hemorrhage of left orbit: Secondary | ICD-10-CM

## 2022-04-26 DIAGNOSIS — S0990XA Unspecified injury of head, initial encounter: Secondary | ICD-10-CM | POA: Insufficient documentation

## 2022-04-26 DIAGNOSIS — E119 Type 2 diabetes mellitus without complications: Secondary | ICD-10-CM | POA: Insufficient documentation

## 2022-04-26 DIAGNOSIS — S0012XA Contusion of left eyelid and periocular area, initial encounter: Secondary | ICD-10-CM | POA: Diagnosis not present

## 2022-04-26 DIAGNOSIS — W1809XA Striking against other object with subsequent fall, initial encounter: Secondary | ICD-10-CM | POA: Insufficient documentation

## 2022-04-26 DIAGNOSIS — S0001XA Abrasion of scalp, initial encounter: Secondary | ICD-10-CM | POA: Diagnosis not present

## 2022-04-26 DIAGNOSIS — H5789 Other specified disorders of eye and adnexa: Secondary | ICD-10-CM

## 2022-04-26 DIAGNOSIS — R220 Localized swelling, mass and lump, head: Secondary | ICD-10-CM | POA: Diagnosis not present

## 2022-04-26 DIAGNOSIS — S0592XA Unspecified injury of left eye and orbit, initial encounter: Secondary | ICD-10-CM | POA: Diagnosis not present

## 2022-04-26 DIAGNOSIS — S0512XA Contusion of eyeball and orbital tissues, left eye, initial encounter: Secondary | ICD-10-CM | POA: Diagnosis not present

## 2022-04-26 NOTE — ED Provider Notes (Signed)
[facility]    CSN: [id number] Arrival date & time: 04/26/22  0973      History   Chief Complaint Chief Complaint  Patient presents with   fall off bed    HPI Rebecca Robertson is a 66 y.o. female.   Patient presents after a fall that occurred early this morning.  Patient reports that she fell out of the bed and hit her face on her nightstand.  Denies any associated dizziness, headache, blurred vision but does report that she had an episode of vomiting this morning after accident occurred.  Patient has bruising surrounding left orbit of eye.  Denies loss of consciousness.  Family member who is present in room states that she has had normal behavior.  Patient does not take blood thinning medications.     Past Medical History:  Diagnosis Date   Anxiety    Diabetes mellitus    FHx: pancreatic disease    History of breast cancer ONCOLOGIST-- DR Humphrey Rolls--  NO RECURRENCE   S/P LEFT PARTIAL MASTECTOMY W/ SLN BX'S  12-02-2010---  DUCTAL CARCINOMA IN SITU--  S/P RADIATION THERAPY ENDED 02-17-2011   Hyperlipidemia    Personal history of radiation therapy 2012   Pneumonia    PONV (postoperative nausea and vomiting)    SUI (stress urinary incontinence, female)    Vitamin D deficiency    Vitamin D insufficiency 03/04/2016    Patient Active Problem List   Diagnosis Date Noted   Syncope 10/07/2021   Sleep walking disorder 10/07/2021   Family history of cerebral aneurysm 53/29/9242   Hair follicle infection 68/34/1962   Bruised ribs, left, initial encounter 10/07/2021   Abnormal feces 08/14/2021   Acne vulgaris 08/14/2021   Dyslipidemia 08/14/2021   Exocrine pancreatic insufficiency 08/14/2021   Gastro-esophageal reflux disease without esophagitis 08/14/2021   History of shingles 08/14/2021   Pain in female genitalia on intercourse 08/14/2021   Solitary pulmonary nodule 08/14/2021   Well woman exam with routine gynecological exam 02/03/2021   Personal history of  breast cancer 02/03/2021   Microalbuminuric diabetic nephropathy ([place]) 08/06/2020   Fatigue 03/27/2019   Wheezing 09/26/2018   Cough in adult 06/21/2018   Vaginal yeast infection 03/28/2018   Abnormal nuclear stress test    Chest pain 12/07/2017   Healthcare maintenance 11/11/2017   Screening for cervical cancer 11/11/2017   Personal history UTI- 04/07/17, + Cx- txed with abx 05/18/2017   Urinary frequency 05/18/2017   Diarrhea in adult patient 05/11/2017   UTI (urinary tract infection) 04/07/2017   Dysuria 04/07/2017   Abnormal urinalysis 04/07/2017   Cervical smear, as part of routine gynecological examination 11/09/2016   Depression 05/28/2016   B12 deficiency 05/28/2016   SUI (stress urinary incontinence, female) 05/28/2016   Hypertriglyceridemia 05/28/2016   Vaginal atrophy 03/25/2016   h/o Anxiety 03/08/2016   Hyperlipidemia 03/04/2016   Vitamin D insufficiency 03/04/2016   History of breast cancer 03/04/2016   Contact dermatitis and eczema due to cause 03/04/2016   Closed fracture of proximal phalanx of toe 04/03/2014   Diabetes ([place]) 03/27/2014   Neoplasm of left breast, primary tumor staging category Tis: ductal carcinoma in situ (DCIS) 02/10/2011    Past Surgical History:  Procedure Laterality Date   BLADDER SUSPENSION N/A 02/03/2013   Procedure: [facility] PROCEDURE;  Surgeon: Reece Packer, MD;  Location: [facility];  Service: Urology;  Laterality: N/A;   BREAST LUMPECTOMY Left 2012   BUNIONECTOMY     CESAREAN SECTION  X4   LEFT HEART CATH AND CORONARY ANGIOGRAPHY N/A 01/26/2018   Procedure: LEFT HEART CATH AND CORONARY ANGIOGRAPHY;  Surgeon: Wellington Hampshire, MD;  Location: [facility];  Service: Cardiovascular;  Laterality: N/A;   MENISCUS REPAIR Left    PARTIAL MASTECTOMY WITH AXILLARY SENTINEL LYMPH NODE BIOPSY Left 12-02-2010    OB History   No obstetric history on file.      Home Medications    Prior to Admission  medications   Medication Sig Start Date End Date Taking? Authorizing Provider  acetaminophen (TYLENOL) 500 MG tablet Take 2 tablets (1,000 mg total) by mouth every 6 (six) hours as needed. 11/14/21   Tedd Sias, PA  albuterol (VENTOLIN HFA) 108 (90 Base) MCG/ACT inhaler INHALE 2 PUFFS INTO THE LUNGS EVERY 6 HOURS AS NEEDED FOR WHEEZING OR SHORTNESS OF BREATH. 01/08/20   Lorrene Reid, PA-C  BAYER CONTOUR NEXT TEST test strip Check blood sugar daily 01/18/16   [provider]  citalopram (CELEXA) 20 MG tablet Take 1 tablet by mouth once daily 04/06/22   Ronnell Freshwater, NP  dicyclomine (BENTYL) 20 MG tablet Take 20 mg by mouth 3 (three) times daily as needed. 11/28/21   [provider]  Fluocinolone Acetonide Body 0.01 % OIL Apply 1 application topically 2 (two) times daily as needed. 02/02/18   Danford, Valetta Fuller D, NP  lovastatin (MEVACOR) 40 MG tablet Take 1 tablet po QD with supper. 02/09/22   Ronnell Freshwater, NP  mupirocin cream (BACTROBAN) 2 % Apply 1 application topically 2 (two) times daily. 10/07/21   Ronnell Freshwater, NP  nystatin ointment (MYCOSTATIN) Apply 1 application topically 2 (two) times daily. 08/14/21   Ronnell Freshwater, NP  ondansetron (ZOFRAN-ODT) 4 MG disintegrating tablet Take 1 tablet (4 mg total) by mouth every 8 (eight) hours as needed for nausea or vomiting. 11/14/21   Tedd Sias, PA  oxybutynin (DITROPAN) 5 MG tablet Take 1 tablet by mouth twice daily 04/06/22   Ronnell Freshwater, NP  Semaglutide,0.25 or 0.'5MG'$ /DOS, 2 MG/1.5ML SOPN Inject 0.25 mg into the skin every Sunday.    [provider]  SYNJARDY 12.12-998 MG TABS Take 1 tablet 2 (two) times daily by mouth.  08/26/15   [provider]  triamcinolone ointment (KENALOG) 0.1 % Apply 1 application topically 2 (two) times daily. 08/14/21   Ronnell Freshwater, NP  TRULANCE 3 MG TABS Take 1 tablet by mouth daily. 04/30/21   [provider]  vitamin B-12 (CYANOCOBALAMIN) 1000 MCG  tablet Take 1,000 mcg by mouth every other day.    [provider]    Family History Family History  Problem Relation Age of Onset   Cancer Mother        lung   Heart attack Father    COPD Father    Heart disease Father     Social History Social History   Tobacco Use   Smoking status: Never   Smokeless tobacco: Never  Vaping Use   Vaping Use: Never used  Substance Use Topics   Alcohol use: No   Drug use: No     Allergies   Compazine, Dulaglutide, Sitagliptin-metformin hcl, Tamoxifen citrate, Aspirin, Atorvastatin, Bydureon [exenatide], and Liraglutide   Review of Systems Review of Systems Per HPI  Physical Exam Triage Vital Signs ED Triage Vitals [04/26/22 0859]  Enc Vitals Group     BP 108/66     Pulse Rate 65     Resp 16  Temp 98 F (36.7 C)     Temp Source Oral     SpO2 98 %     Weight      Height      Head Circumference      Peak Flow      Pain Score 7     Pain Loc      Pain Edu?      Excl. in [place]?    No data found.  Updated Vital Signs BP 108/66 (BP Location: Right Arm)   Pulse 65   Temp 98 F (36.7 C) (Oral)   Resp 16   SpO2 98%   Visual Acuity Right Eye Distance:   Left Eye Distance:   Bilateral Distance:    Right Eye Near:   Left Eye Near:    Bilateral Near:     Physical Exam Constitutional:      General: She is not in acute distress.    Appearance: Normal appearance. She is not toxic-appearing or diaphoretic.  HENT:     Head: Normocephalic and atraumatic.  Eyes:     Extraocular Movements: Extraocular movements intact.     Conjunctiva/sclera: Conjunctivae normal.     Comments: Significant bruising and swelling surrounding orbit of left eye.  There is minimal very superficial approximately 1 mm linear laceration present directly above left eyebrow.  Bleeding is controlled.  Pulmonary:     Effort: Pulmonary effort is normal.  Neurological:     General: No focal deficit present.     Mental Status: She is alert and  oriented to person, place, and time. Mental status is at baseline.     Cranial Nerves: Cranial nerves 2-12 are intact.     Sensory: Sensation is intact.     Motor: Motor function is intact.     Coordination: Coordination is intact.     Gait: Gait is intact.  Psychiatric:        Mood and Affect: Mood normal.        Behavior: Behavior normal.        Thought Content: Thought content normal.        Judgment: Judgment normal.      UC Treatments / Results  Labs (all labs ordered are listed, but only abnormal results are displayed) Labs Reviewed - No data to display  EKG   Radiology No results found.  Procedures Procedures (including critical care time)  Medications Ordered in UC Medications - No data to display  Initial Impression / Assessment and Plan / UC Course  I have reviewed the triage vital signs and the nursing notes.  Pertinent labs & imaging results that were available during my care of the patient were reviewed by me and considered in my medical decision making (see chart for details).     I do think the patient needs more advanced imaging of the face/head to rule out any worrisome etiologies related to head injury.  Do not have these capabilities here in urgent care so patient was advised to go to the emergency department for further evaluation and management.  Patient was agreeable with plan.  Neuro exam and patient stable at discharge.  Agree with her husband transporting her to the hospital. Final Clinical Impressions(s) / UC Diagnoses   Final diagnoses:  Fall, initial encounter  Facial injury, initial encounter     Discharge Instructions      Go to the emergency department as soon as you leave urgent care for further evaluation and management.  ED Prescriptions   None    PDMP not reviewed this encounter.   Teodora Medici, [place] 04/26/22 [phone]

## 2022-04-26 NOTE — Discharge Instructions (Signed)
Go to the emergency department as soon as you leave urgent care for further evaluation and management. 

## 2022-04-26 NOTE — ED Notes (Signed)
Patient is being discharged from the Urgent Care and sent to the Emergency Department via POV . Per [facility] NP , patient is in need of higher level of care due to facial injury due to fall . Patient is aware and verbalizes understanding of plan of care.  Vitals:   04/26/22 0859  BP: 108/66  Pulse: 65  Resp: 16  Temp: 98 F (36.7 C)  SpO2: 98%

## 2022-04-26 NOTE — Discharge Instructions (Addendum)
Work-up today for your fall and facial injury was overall reassuring.  CT scans of your face and head were unremarkable.  They follow-up with PCP regarding recent injury.  If you have new worsening visual disturbance, facial droop, changes in your gait, loss of sensation or range of motion of your extremities please return to the emergency department for further evaluation.

## 2022-04-26 NOTE — ED Notes (Signed)
Pt refused visual acuity screening stating she is not having any vision problems and denies any blurred vision. Pt stated if she was she would do the test and let us know if she was having any problems.

## 2022-04-26 NOTE — ED Triage Notes (Signed)
Pt c/o falling out of bed and hitting head on nightstand. Says it happened in the early hours this morning and this is a recurring scenario for her.   Iva Boop is not populating on her med list but she also takes this one.

## 2022-04-26 NOTE — ED Provider Notes (Signed)
[facility] DEPT Provider Note   CSN: [id number] Arrival date & time: 04/26/22  1028     History  Chief Complaint  Patient presents with   Fall   HPI Rebecca Robertson is a 66 y.o. female with type 2 diabetes presenting for fall.  States that around 3 AM she was dreaming and rolled over and landed on her nightstand.  She hit her left eye and left forehead.  She denies loss of consciousness.  Husband also was there and witnessed that she arose from the floor with no issues.  She does endorse some visual disturbance of the left eye but says it has improved since this morning.  She also mentioned that she throat 1 time after the accident.  Patient states that she is safe at home.  Denies SI/HI.  She did mention that she has a family history of brain aneurysms which is concerning to her given that she fell and hit her head.  Has not take any medication to alleviate her symptoms.  Fall       Home Medications Prior to Admission medications   Medication Sig Start Date End Date Taking? Authorizing Provider  acetaminophen (TYLENOL) 500 MG tablet Take 2 tablets (1,000 mg total) by mouth every 6 (six) hours as needed. 11/14/21   Tedd Sias, PA  albuterol (VENTOLIN HFA) 108 (90 Base) MCG/ACT inhaler INHALE 2 PUFFS INTO THE LUNGS EVERY 6 HOURS AS NEEDED FOR WHEEZING OR SHORTNESS OF BREATH. 01/08/20   Lorrene Reid, PA-C  BAYER CONTOUR NEXT TEST test strip Check blood sugar daily 01/18/16   [provider]  citalopram (CELEXA) 20 MG tablet Take 1 tablet by mouth once daily 04/06/22   Ronnell Freshwater, NP  dicyclomine (BENTYL) 20 MG tablet Take 20 mg by mouth 3 (three) times daily as needed. 11/28/21   [provider]  Fluocinolone Acetonide Body 0.01 % OIL Apply 1 application topically 2 (two) times daily as needed. 02/02/18   Danford, Valetta Fuller D, NP  lovastatin (MEVACOR) 40 MG tablet Take 1 tablet po QD with supper. 02/09/22   Ronnell Freshwater, NP   mupirocin cream (BACTROBAN) 2 % Apply 1 application topically 2 (two) times daily. 10/07/21   Ronnell Freshwater, NP  nystatin ointment (MYCOSTATIN) Apply 1 application topically 2 (two) times daily. 08/14/21   Ronnell Freshwater, NP  ondansetron (ZOFRAN-ODT) 4 MG disintegrating tablet Take 1 tablet (4 mg total) by mouth every 8 (eight) hours as needed for nausea or vomiting. 11/14/21   Tedd Sias, PA  oxybutynin (DITROPAN) 5 MG tablet Take 1 tablet by mouth twice daily 04/06/22   Ronnell Freshwater, NP  Semaglutide,0.25 or 0.'5MG'$ /DOS, 2 MG/1.5ML SOPN Inject 0.25 mg into the skin every Sunday.    [provider]  SYNJARDY 12.12-998 MG TABS Take 1 tablet 2 (two) times daily by mouth.  08/26/15   [provider]  triamcinolone ointment (KENALOG) 0.1 % Apply 1 application topically 2 (two) times daily. 08/14/21   Ronnell Freshwater, NP  TRULANCE 3 MG TABS Take 1 tablet by mouth daily. 04/30/21   [provider]  vitamin B-12 (CYANOCOBALAMIN) 1000 MCG tablet Take 1,000 mcg by mouth every other day.    [provider]      Allergies    Compazine, Dulaglutide, Sitagliptin-metformin hcl, Tamoxifen citrate, Aspirin, Atorvastatin, Bydureon [exenatide], and Liraglutide    Review of Systems   Review of Systems  HENT:  Facial injury  Eyes:  Positive for visual disturbance.    Physical Exam Updated Vital Signs BP (!) 143/78 (BP Location: Right Arm)   Pulse (!) 51   Temp 97.7 F (36.5 C) (Oral)   Resp 18   Ht '5\' 3"'$  (1.6 m)   Wt 58.1 kg   SpO2 99%   BMI 22.69 kg/m  Physical Exam Vitals and nursing note reviewed.  HENT:     Head: Normocephalic and atraumatic.     Comments: Mild abrasions with no active bleeding noted in the left frontal scalp.    Mouth/Throat:     Mouth: Mucous membranes are moist.  Eyes:     General:        Right eye: No discharge.        Left eye: No discharge.     Extraocular Movements: Extraocular movements intact.      Conjunctiva/sclera: Conjunctivae normal.     Comments: Right normal.  Swelling and ecchymosis noted in the inferior periorbital space about the left eye.  Cardiovascular:     Rate and Rhythm: Normal rate and regular rhythm.     Pulses: Normal pulses.     Heart sounds: Normal heart sounds.  Pulmonary:     Effort: Pulmonary effort is normal.     Breath sounds: Normal breath sounds.  Abdominal:     General: Abdomen is flat.     Palpations: Abdomen is soft.  Skin:    General: Skin is warm and dry.  Neurological:     General: No focal deficit present.     Comments: GCS 15. Speech is goal oriented. No deficits appreciated to CN III-XII; symmetric eyebrow raise, no facial drooping, tongue midline. Patient has equal grip strength bilaterally with 5/5 strength against resistance in all major muscle groups bilaterally. Sensation to light touch intact. Patient moves extremities without ataxia. Normal finger-nose-finger. Patient ambulatory with steady gait.   Psychiatric:        Mood and Affect: Mood normal.       ED Results / Procedures / Treatments    Labs (all labs ordered are listed, but only abnormal results are displayed) Labs Reviewed - No data to display  EKG None  Radiology CT Maxillofacial Wo Contrast  Result Date: 04/26/2022 CLINICAL DATA:  Polytrauma, blunt EXAM: CT HEAD WITHOUT CONTRAST CT MAXILLOFACIAL WITHOUT CONTRAST TECHNIQUE: Multidetector CT imaging of the head and maxillofacial structures were performed using the standard protocol without intravenous contrast. Multiplanar CT image reconstructions of the maxillofacial structures were also generated. RADIATION DOSE REDUCTION: This exam was performed according to the departmental dose-optimization program which includes automated exposure control, adjustment of the mA and/or kV according to patient size and/or use of iterative reconstruction technique. COMPARISON:  None Available. FINDINGS: CT HEAD FINDINGS Brain: No evidence  of acute infarction, hemorrhage, hydrocephalus, extra-axial collection or mass lesion/mass effect. Vascular: No hyperdense vessel or unexpected calcification. Skull: Normal. Negative for fracture or focal lesion. Other: Negative for scalp hematoma. CT MAXILLOFACIAL FINDINGS Osseous: No acute maxillofacial bone fracture. Bony orbital walls are intact. Mandible intact. Temporomandibular joints are aligned without dislocation. Orbits: Negative. No traumatic or inflammatory finding. Sinuses: Clear. Soft tissues: Prominent left facial swelling in the maxillary region. IMPRESSION: 1. No acute intracranial findings. 2. No acute maxillofacial bone fracture. 3. Prominent left facial swelling in the maxillary region. Electronically Signed   By: Davina Poke D.O.   On: 04/26/2022 11:34   CT Head Wo Contrast  Result Date: 04/26/2022 CLINICAL DATA:  Polytrauma, blunt  EXAM: CT HEAD WITHOUT CONTRAST CT MAXILLOFACIAL WITHOUT CONTRAST TECHNIQUE: Multidetector CT imaging of the head and maxillofacial structures were performed using the standard protocol without intravenous contrast. Multiplanar CT image reconstructions of the maxillofacial structures were also generated. RADIATION DOSE REDUCTION: This exam was performed according to the departmental dose-optimization program which includes automated exposure control, adjustment of the mA and/or kV according to patient size and/or use of iterative reconstruction technique. COMPARISON:  None Available. FINDINGS: CT HEAD FINDINGS Brain: No evidence of acute infarction, hemorrhage, hydrocephalus, extra-axial collection or mass lesion/mass effect. Vascular: No hyperdense vessel or unexpected calcification. Skull: Normal. Negative for fracture or focal lesion. Other: Negative for scalp hematoma. CT MAXILLOFACIAL FINDINGS Osseous: No acute maxillofacial bone fracture. Bony orbital walls are intact. Mandible intact. Temporomandibular joints are aligned without dislocation. Orbits:  Negative. No traumatic or inflammatory finding. Sinuses: Clear. Soft tissues: Prominent left facial swelling in the maxillary region. IMPRESSION: 1. No acute intracranial findings. 2. No acute maxillofacial bone fracture. 3. Prominent left facial swelling in the maxillary region. Electronically Signed   By: Davina Poke D.O.   On: 04/26/2022 11:34    Procedures Procedures    Medications Ordered in ED Medications - No data to display  ED Course/ Medical Decision Making/ A&P                           Medical Decision Making Amount and/or Complexity of Data Reviewed Radiology: ordered.   This patient presents to the ED for concern of facial/head injury, this involves a number of treatment options, and is a complaint that carries with it a high risk of complications and morbidity.  The differential diagnosis includes ICH, periorbital and/or skull fracture, and eye trauma.   Co morbidities: Discussed in HPI    EMR reviewed including pt PMHx, past surgical history and past visits to ER.   See HPI for more details   Lab Tests:   No labs ordered   Imaging Studies:  Abnormal findings. I personally reviewed all imaging studies. Imaging notable for prominent left facial swelling    Cardiac Monitoring:  The patient was maintained on a cardiac monitor.  I personally viewed and interpreted the cardiac monitored which showed an underlying rhythm of: NSR EKG non-ischemic   Medicines ordered:  No medications ordered. Reevaluation of the patient after these medicines showed that the patient improved.  Patient stated that vision is much improved.  And that pain in the area where she was hit is also improved since this morning. I have reviewed the patients home medicines and have made adjustments as needed    Consults/Attending Physician   I discussed this case with my attending physician who cosigned this note including patient's presenting symptoms, physical exam, and  planned diagnostics and interventions. Attending physician stated agreement with plan or made changes to plan which were implemented.   Reevaluation:  After the interventions noted above I re-evaluated patient and found that they have :improved      Problem List / ED Course:  Presented for fall and facial injury sustained this morning during sleep.  Was sent by urgent care given concerning findings on physical exam.  CT scans did not reveal any evidence of possible head bleed or fracture. neuro exam did not reveal anything focal.  Exam did reveal swelling and ecchymosis of the inferior left periorbital region.  Patient refused visual acuity stating that she feels much better and would like to go home.  Dispostion:  After consideration of the diagnostic results and the patients response to treatment, I feel that the patent would benefit from follow-up with PCP regarding her recent injury.          Final Clinical Impression(s) / ED Diagnoses Final diagnoses:  Periorbital swelling  Periorbital hematoma of left eye  Head injury    Rx / DC Orders ED Discharge Orders     None         Harriet Pho, PA-C 04/26/22 1515    Elnora Morrison, MD 04/26/22 1547

## 2022-04-26 NOTE — ED Triage Notes (Signed)
Patient here POV from UC.  Endorses Rolling OOB while asleep this AM at 0300. Patient sustained Facial and Head Injury against Night Stand.  No LOC. No Anticoagulants.   Seen at [facility] and Sent for Evaluation and Imaging.  NAD Noted during Triage. A&Ox4. GCS 15. Ambulatory.

## 2022-05-01 ENCOUNTER — Telehealth: Payer: Self-pay | Admitting: Nurse Practitioner

## 2022-05-01 NOTE — Telephone Encounter (Signed)
Called pt she is advised of her lab results recommendation

## 2022-05-01 NOTE — Telephone Encounter (Signed)
Patient is asking if she was supposed to come back for a repeat pap? Please advise.

## 2022-05-01 NOTE — Telephone Encounter (Signed)
Please let the patient know that her pap smear was normal. This soul be repeated in three years. She should return to the office in December for 6 month check up.  Thanks  -HB

## 2022-05-27 DIAGNOSIS — R109 Unspecified abdominal pain: Secondary | ICD-10-CM | POA: Diagnosis not present

## 2022-05-27 DIAGNOSIS — R197 Diarrhea, unspecified: Secondary | ICD-10-CM | POA: Diagnosis not present

## 2022-06-01 NOTE — Progress Notes (Signed)
Normal pap. Repeat this in three years.  Mild elevation ldl and blood sugar.  I recommend she limit intake of fried and fatty foods. She should increase intake of lean proteins and green leafy vegetables. Adding exercise into daily routine will also be beneficial.  Other labs normal.

## 2022-06-08 DIAGNOSIS — N959 Unspecified menopausal and perimenopausal disorder: Secondary | ICD-10-CM | POA: Diagnosis not present

## 2022-06-08 DIAGNOSIS — N3000 Acute cystitis without hematuria: Secondary | ICD-10-CM | POA: Diagnosis not present

## 2022-06-16 ENCOUNTER — Ambulatory Visit: Payer: BC Managed Care – PPO | Admitting: Physician Assistant

## 2022-06-16 ENCOUNTER — Encounter: Payer: Self-pay | Admitting: Physician Assistant

## 2022-06-16 ENCOUNTER — Ambulatory Visit: Payer: BC Managed Care – PPO | Admitting: Nurse Practitioner

## 2022-06-16 VITALS — BP 99/67 | HR 65 | Ht 63.0 in | Wt 126.4 lb

## 2022-06-16 DIAGNOSIS — N390 Urinary tract infection, site not specified: Secondary | ICD-10-CM | POA: Diagnosis not present

## 2022-06-16 DIAGNOSIS — R319 Hematuria, unspecified: Secondary | ICD-10-CM

## 2022-06-16 LAB — POCT URINALYSIS DIP (CLINITEK)
Bilirubin, UA: NEGATIVE
Glucose, UA: 500 mg/dL — AB
Ketones, POC UA: NEGATIVE mg/dL
Leukocytes, UA: NEGATIVE
Nitrite, UA: POSITIVE — AB
POC PROTEIN,UA: NEGATIVE
Spec Grav, UA: 1.015 (ref 1.010–1.025)
Urobilinogen, UA: 0.2 E.U./dL
pH, UA: 5 (ref 5.0–8.0)

## 2022-06-16 MED ORDER — SULFAMETHOXAZOLE-TRIMETHOPRIM 800-160 MG PO TABS: 1.0000 | ORAL_TABLET | Freq: Two times a day (BID) | ORAL | 0 refills | Status: AC

## 2022-06-16 NOTE — Progress Notes (Signed)
Telehealth office visit note for Rebecca Reid, PA-C- at Primary Care at [facility]   I connected with current patient today by telephone and verified that I am speaking with the correct person    Location of the patient: Home  Location of the provider: home office - This visit type was conducted due to national recommendations for restrictions regarding the COVID-19 Pandemic (e.g. social distancing) in an effort to limit this patient's exposure and mitigate transmission in our community.    - No physical exam could be performed with this format, beyond that communicated to [place] by the patient/ family members as noted.   - Additionally my office staff/ schedulers were to discuss with the patient that there may be a monetary charge related to this service, depending on their medical insurance.  My understanding is that patient understood and consented to proceed.     _________________________________________________________________________________   History of Present Illness: Patient calls with c/o dysuria, urinary frequency and pelvic pain. Symptoms started 2 days ago. No fever or flank pain. Tried Azo which provides some relief.       02/09/2022    8:41 AM 10/07/2021   11:16 AM 08/14/2021    9:04 AM 02/03/2021    9:15 AM  GAD 7 : Generalized Anxiety Score  Nervous, Anxious, on Edge 0 0 0 0  Control/stop worrying 0 0 0 0  Worry too much - different things 0 0 0 0  Trouble relaxing 0 0 0 0  Restless 0 0 0 0  Easily annoyed or irritable 0 0 0 0  Afraid - awful might happen 0 0 0 0  Total GAD 7 Score 0 0 0 0  Anxiety Difficulty   Not difficult at all        02/09/2022    8:40 AM 10/07/2021   11:16 AM 08/14/2021    9:04 AM 02/03/2021    9:14 AM 01/06/2021    3:17 PM  Depression screen PHQ 2/9  Decreased Interest 0 0 0 0 0  Down, Depressed, Hopeless 0 0 0 0 0  PHQ - 2 Score 0 0 0 0 0  Altered sleeping 0 0 0 0 0  Tired, decreased energy 0 0 0 0 0  Change in appetite 0  1 1 0 0  Feeling bad or failure about yourself  0 0 0 0 0  Trouble concentrating 0 0 0 0 0  Moving slowly or fidgety/restless 0 0 0 0 0  Suicidal thoughts 0 0 0 0 0  PHQ-9 Score 0 1 1 0 0  Difficult doing work/chores   Not difficult at all        Impression and Recommendations:     1. Urinary tract infection with hematuria, site unspecified    -UA shows signs consistent with cystitis so will start empiric antibiotic therapy with Bactrim DS twice daily x 3 days, previously tolerated medication without issues. Pending urine culture will adjust treatment plan if indicated. Discussed good hydration. Follow-up if symptoms fail to improve or worsen.    - As part of my medical decision making, I reviewed the following data within the [place] History obtained from pt /family, CMA notes reviewed and incorporated if applicable, Labs reviewed, Radiograph/ tests reviewed if applicable and OV notes from prior OV's with me, as well as any other specialists she/he has seen since seeing me last, were all reviewed and used in my medical decision making process today.    -  Additionally, when appropriate, discussion had with patient regarding our treatment plan, and their biases/concerns about that plan were used in my medical decision making today.    - The patient agreed with the plan and demonstrated an understanding of the instructions.   No barriers to understanding were identified.     - The patient was advised to call back or seek an in-person evaluation if the symptoms worsen or if the condition fails to improve as anticipated.   Return if symptoms worsen or fail to improve.    Orders Placed This Encounter  Procedures   Urine Culture   POCT URINALYSIS DIP (CLINITEK)    Meds ordered this encounter  Medications   sulfamethoxazole-trimethoprim (BACTRIM DS) 800-160 MG tablet    Sig: Take 1 tablet by mouth 2 (two) times daily for 3 days.    Dispense:  6 tablet    Refill:   0    Order Specific Question:   Supervising Provider    Answer:   Beatrice Lecher D [[id number]]    There are no discontinued medications.     Time spent on telephone encounter was 6 minutes.      The [place] was signed into law in 2016 which includes the topic of electronic health records.  This provides immediate access to information in MyChart.  This includes consultation notes, operative notes, office notes, lab results and pathology reports.  If you have any questions about what you read please let us know at your next visit or call us at the office.  We are right here with you.   __________________________________________________________________________________     Patient Care Team    Relationship Specialty Notifications Start End  Ronnell Freshwater, NP PCP - General Family Medicine  01/28/21   Teena Irani, MD (Inactive) Consulting Physician Gastroenterology  06/02/16   Delrae Rend, MD Consulting Physician Endocrinology  06/02/16   Druscilla Brownie, MD Consulting Physician Dermatology  06/02/16   Nicholas Lose, MD Consulting Physician Hematology and Oncology  06/02/16   Bjorn Loser, MD Consulting Physician Urology  06/02/16   Christain Sacramento, [place] Referring Physician Optometry  05/11/17      -Vitals obtained; medications/ allergies reconciled;  personal medical, social, Sx etc.histories were updated by CMA, reviewed by me and are reflected in chart   Patient Active Problem List   Diagnosis Date Noted   Syncope 10/07/2021   Sleep walking disorder 10/07/2021   Family history of cerebral aneurysm 18/29/9371   Hair follicle infection 69/67/8938   Bruised ribs, left, initial encounter 10/07/2021   Abnormal feces 08/14/2021   Acne vulgaris 08/14/2021   Dyslipidemia 08/14/2021   Exocrine pancreatic insufficiency 08/14/2021   Gastro-esophageal reflux disease without esophagitis 08/14/2021   History of shingles 08/14/2021   Pain in female genitalia  on intercourse 08/14/2021   Solitary pulmonary nodule 08/14/2021   Well woman exam with routine gynecological exam 02/03/2021   Personal history of breast cancer 02/03/2021   Microalbuminuric diabetic nephropathy ([place]) 08/06/2020   Fatigue 03/27/2019   Wheezing 09/26/2018   Cough in adult 06/21/2018   Vaginal yeast infection 03/28/2018   Abnormal nuclear stress test    Chest pain 12/07/2017   Healthcare maintenance 11/11/2017   Screening for cervical cancer 11/11/2017   Personal history UTI- 04/07/17, + Cx- txed with abx 05/18/2017   Urinary frequency 05/18/2017   Diarrhea in adult patient 05/11/2017   UTI (urinary tract infection) 04/07/2017   Dysuria 04/07/2017   Abnormal urinalysis 04/07/2017   Cervical  smear, as part of routine gynecological examination 11/09/2016   Depression 05/28/2016   B12 deficiency 05/28/2016   SUI (stress urinary incontinence, female) 05/28/2016   Hypertriglyceridemia 05/28/2016   Vaginal atrophy 03/25/2016   h/o Anxiety 03/08/2016   Hyperlipidemia 03/04/2016   Vitamin D insufficiency 03/04/2016   History of breast cancer 03/04/2016   Contact dermatitis and eczema due to cause 03/04/2016   Closed fracture of proximal phalanx of toe 04/03/2014   Diabetes ([place]) 03/27/2014   Neoplasm of left breast, primary tumor staging category Tis: ductal carcinoma in situ (DCIS) 02/10/2011     Current Meds  Medication Sig   acetaminophen (TYLENOL) 500 MG tablet Take 2 tablets (1,000 mg total) by mouth every 6 (six) hours as needed.   albuterol (VENTOLIN HFA) 108 (90 Base) MCG/ACT inhaler INHALE 2 PUFFS INTO THE LUNGS EVERY 6 HOURS AS NEEDED FOR WHEEZING OR SHORTNESS OF BREATH.   BAYER CONTOUR NEXT TEST test strip Check blood sugar daily   citalopram (CELEXA) 20 MG tablet Take 1 tablet by mouth once daily   dicyclomine (BENTYL) 20 MG tablet Take 20 mg by mouth 3 (three) times daily as needed.   Fluocinolone Acetonide Body 0.01 % OIL Apply 1 application topically 2  (two) times daily as needed.   lovastatin (MEVACOR) 40 MG tablet Take 1 tablet po QD with supper.   mupirocin cream (BACTROBAN) 2 % Apply 1 application topically 2 (two) times daily.   nystatin ointment (MYCOSTATIN) Apply 1 application topically 2 (two) times daily.   ondansetron (ZOFRAN-ODT) 4 MG disintegrating tablet Take 1 tablet (4 mg total) by mouth every 8 (eight) hours as needed for nausea or vomiting.   oxybutynin (DITROPAN) 5 MG tablet Take 1 tablet by mouth twice daily   Semaglutide,0.25 or 0.'5MG'$ /DOS, 2 MG/1.5ML SOPN Inject 0.25 mg into the skin every Sunday.   sulfamethoxazole-trimethoprim (BACTRIM DS) 800-160 MG tablet Take 1 tablet by mouth 2 (two) times daily for 3 days.   SYNJARDY 12.12-998 MG TABS Take 1 tablet 2 (two) times daily by mouth.    triamcinolone ointment (KENALOG) 0.1 % Apply 1 application topically 2 (two) times daily.   TRULANCE 3 MG TABS Take 1 tablet by mouth daily.   vitamin B-12 (CYANOCOBALAMIN) 1000 MCG tablet Take 1,000 mcg by mouth every other day.     Allergies:  Allergies  Allergen Reactions   Compazine Other (See Comments)    Possible seizure, unable to talk, eyes rolled toward back of head, mouth became dry.   Dulaglutide Other (See Comments)    GI upset   Sitagliptin-Metformin Hcl Other (See Comments)    GI upset   Tamoxifen Citrate Other (See Comments)   Aspirin Other (See Comments)    bruising   Atorvastatin Other (See Comments)    fatigue   Bydureon [Exenatide]     Severe burning with injection   Liraglutide Other (See Comments)    GI upset     ROS:  See above HPI for pertinent positives and negatives   Objective:   Blood pressure 99/67, pulse 65, height '5\' 3"'$  (1.6 m), weight 126 lb 6.4 oz (57.3 kg), SpO2 99 %.  (if some vitals are omitted, this means that patient was UNABLE to obtain them.) General: A & O * 3; sounds in no acute distress Respiratory: speaking in full sentences, no conversational dyspnea Psych: insight appears  good, mood- appears full

## 2022-06-19 LAB — URINE CULTURE

## 2022-06-22 ENCOUNTER — Telehealth: Payer: Self-pay

## 2022-06-22 NOTE — Telephone Encounter (Signed)
Pt called back for results.  Pt was advised per provider urine culture is positive for E. Coli and susceptible to the antibiotic prescribed- Bactrim.   Pt understood and had no further questions  FYI

## 2022-06-22 NOTE — Progress Notes (Signed)
Patient has been notified of lab results as well as treatment recommendations. All questions and concerns have been addressed.

## 2022-06-22 NOTE — Telephone Encounter (Signed)
Ok thanks 

## 2022-06-29 DIAGNOSIS — E1165 Type 2 diabetes mellitus with hyperglycemia: Secondary | ICD-10-CM | POA: Diagnosis not present

## 2022-06-29 DIAGNOSIS — Z8639 Personal history of other endocrine, nutritional and metabolic disease: Secondary | ICD-10-CM | POA: Diagnosis not present

## 2022-06-29 DIAGNOSIS — K8681 Exocrine pancreatic insufficiency: Secondary | ICD-10-CM | POA: Diagnosis not present

## 2022-07-06 DIAGNOSIS — K8681 Exocrine pancreatic insufficiency: Secondary | ICD-10-CM | POA: Diagnosis not present

## 2022-07-06 DIAGNOSIS — K589 Irritable bowel syndrome without diarrhea: Secondary | ICD-10-CM | POA: Diagnosis not present

## 2022-07-06 DIAGNOSIS — K219 Gastro-esophageal reflux disease without esophagitis: Secondary | ICD-10-CM | POA: Diagnosis not present

## 2022-07-10 ENCOUNTER — Other Ambulatory Visit: Payer: Self-pay | Admitting: Nurse Practitioner

## 2022-07-13 NOTE — Telephone Encounter (Signed)
Needs an office visit for further refills

## 2022-07-15 ENCOUNTER — Encounter: Payer: Self-pay | Admitting: Nurse Practitioner

## 2022-07-15 ENCOUNTER — Ambulatory Visit: Payer: BC Managed Care – PPO | Admitting: Nurse Practitioner

## 2022-07-15 VITALS — BP 103/67 | HR 87 | Ht 63.0 in | Wt 126.8 lb

## 2022-07-15 DIAGNOSIS — F411 Generalized anxiety disorder: Secondary | ICD-10-CM

## 2022-07-15 DIAGNOSIS — B379 Candidiasis, unspecified: Secondary | ICD-10-CM | POA: Diagnosis not present

## 2022-07-15 DIAGNOSIS — R350 Frequency of micturition: Secondary | ICD-10-CM

## 2022-07-15 DIAGNOSIS — E119 Type 2 diabetes mellitus without complications: Secondary | ICD-10-CM

## 2022-07-15 DIAGNOSIS — F513 Sleepwalking [somnambulism]: Secondary | ICD-10-CM

## 2022-07-15 DIAGNOSIS — R55 Syncope and collapse: Secondary | ICD-10-CM | POA: Diagnosis not present

## 2022-07-15 DIAGNOSIS — E782 Mixed hyperlipidemia: Secondary | ICD-10-CM | POA: Diagnosis not present

## 2022-07-15 LAB — POCT URINALYSIS DIP (CLINITEK)
Bilirubin, UA: NEGATIVE
Blood, UA: NEGATIVE
Glucose, UA: >=1000 mg/dL — AB
Ketones, POC UA: NEGATIVE mg/dL
Leukocytes, UA: NEGATIVE
Nitrite, UA: NEGATIVE
POC PROTEIN,UA: NEGATIVE
Spec Grav, UA: 1.01 (ref 1.010–1.025)
Urobilinogen, UA: 0.2 E.U./dL
pH, UA: 5 (ref 5.0–8.0)

## 2022-07-15 LAB — POCT UA - MICROALBUMIN
Albumin/Creatinine Ratio, Urine, POC: 30
Creatinine, POC: 100 mg/dL
Microalbumin Ur, POC: 10 mg/L

## 2022-07-15 MED ORDER — CITALOPRAM HYDROBROMIDE 20 MG PO TABS: 20.0000 mg | ORAL_TABLET | Freq: Every day | ORAL | 1 refills | Status: DC

## 2022-07-15 MED ORDER — FLUCONAZOLE 150 MG PO TABS: ORAL_TABLET | ORAL | 0 refills | Status: DC

## 2022-07-15 MED ORDER — OXYBUTYNIN CHLORIDE 5 MG PO TABS: 5.0000 mg | ORAL_TABLET | Freq: Two times a day (BID) | ORAL | 1 refills | Status: DC

## 2022-07-15 NOTE — Progress Notes (Signed)
Established patient visit   Patient: Rebecca Robertson   DOB: 18-Jul-1956   66 y.o. Female  MRN: [id number] Visit Date: 07/15/2022   Chief Complaint  Patient presents with   Medication Refill   Subjective    HPI  Follow up -type 2 diabetes  -currently on synjardy and ozempic  -most recent HgbA1c 7.6   Continues to have episodes of sleep-walking, causing her to have episodes of syncope. Most recent episode happened in 04/2022. She fell, hitting her face and head on the night stand. She had significant bruising to the left side of the face. She dd go to the ED. CT of the head and facial bones was performed during that visit. No acute abnormalities were noted. This is third episode she has had over the past year. An MRI had been ordered earlier this year due to these symptoms. Insurance would not cover. Upon waking up, she is fine, does not even know she did anything. She is completely unaware that anything had happened. Patient states that her children will not let grandchildren stay overnight with her until problem is addressed further.   Recently treated fir UTI  -since then, she feels an ache in the pubic area every time she has to urinate.  -urine sample is currently clear of infection or blood.     Medications: Outpatient Medications Prior to Visit  Medication Sig   acetaminophen (TYLENOL) 500 MG tablet Take 2 tablets (1,000 mg total) by mouth every 6 (six) hours as needed.   albuterol (VENTOLIN HFA) 108 (90 Base) MCG/ACT inhaler INHALE 2 PUFFS INTO THE LUNGS EVERY 6 HOURS AS NEEDED FOR WHEEZING OR SHORTNESS OF BREATH.   BAYER CONTOUR NEXT TEST test strip Check blood sugar daily   dicyclomine (BENTYL) 20 MG tablet Take 20 mg by mouth 3 (three) times daily as needed.   Fluocinolone Acetonide Body 0.01 % OIL Apply 1 application topically 2 (two) times daily as needed.   lovastatin (MEVACOR) 40 MG tablet Take 1 tablet po QD with supper.   mupirocin cream (BACTROBAN) 2 % Apply 1  application topically 2 (two) times daily.   nystatin ointment (MYCOSTATIN) Apply 1 application topically 2 (two) times daily.   ondansetron (ZOFRAN-ODT) 4 MG disintegrating tablet Take 1 tablet (4 mg total) by mouth every 8 (eight) hours as needed for nausea or vomiting.   Semaglutide,0.25 or 0.5MG/DOS, 2 MG/1.5ML SOPN Inject 0.25 mg into the skin every Sunday.   SYNJARDY 12.12-998 MG TABS Take 1 tablet 2 (two) times daily by mouth.    triamcinolone ointment (KENALOG) 0.1 % Apply 1 application topically 2 (two) times daily.   TRULANCE 3 MG TABS Take 1 tablet by mouth daily.   vitamin B-12 (CYANOCOBALAMIN) 1000 MCG tablet Take 1,000 mcg by mouth every other day.   [DISCONTINUED] citalopram (CELEXA) 20 MG tablet Take 1 tablet by mouth once daily   [DISCONTINUED] oxybutynin (DITROPAN) 5 MG tablet Take 1 tablet by mouth twice daily   No facility-administered medications prior to visit.    Review of Systems  Constitutional:  Negative for activity change, appetite change, chills, fatigue and fever.  HENT:  Negative for congestion, postnasal drip, rhinorrhea, sinus pressure, sinus pain, sneezing and sore throat.   Eyes: Negative.   Respiratory:  Negative for cough, chest tightness, shortness of breath and wheezing.   Cardiovascular:  Negative for chest pain and palpitations.  Gastrointestinal:  Negative for abdominal pain, constipation, diarrhea, nausea and vomiting.  Endocrine: Negative for cold intolerance, heat intolerance,  polydipsia and polyuria.       Blood sugars doing well    Genitourinary:  Positive for frequency. Negative for dyspareunia, dysuria, flank pain and urgency.       Vaginal itching and irritation   Musculoskeletal:  Negative for arthralgias, back pain and myalgias.  Skin:  Negative for rash.  Allergic/Immunologic: Negative for environmental allergies.  Neurological:  Positive for dizziness, weakness and headaches.  Hematological:  Negative for adenopathy.   Psychiatric/Behavioral:  The patient is not nervous/anxious.     Last CBC Lab Results  Component Value Date   WBC 6.2 02/09/2022   HGB 13.0 02/09/2022   HCT 38.0 02/09/2022   MCV 89 02/09/2022   MCH 30.4 02/09/2022   RDW 13.5 02/09/2022   PLT 209 03/50/0938   Last metabolic panel Lab Results  Component Value Date   GLUCOSE 159 (H) 02/09/2022   NA 141 02/09/2022   K 4.5 02/09/2022   CL 106 02/09/2022   CO2 22 02/09/2022   BUN 11 02/09/2022   CREATININE 0.55 (L) 02/09/2022   EGFR 102 02/09/2022   CALCIUM 9.3 02/09/2022   PROT 7.0 02/09/2022   ALBUMIN 4.2 02/09/2022   LABGLOB 2.8 02/09/2022   AGRATIO 1.5 02/09/2022   BILITOT <0.2 02/09/2022   ALKPHOS 106 02/09/2022   AST 19 02/09/2022   ALT 17 02/09/2022   ANIONGAP 12 11/14/2021   Last lipids Lab Results  Component Value Date   CHOL 170 02/09/2022   HDL 41 02/09/2022   LDLCALC 91 02/09/2022   TRIG 228 (H) 02/09/2022   CHOLHDL 4.1 02/09/2022   Last hemoglobin A1c Lab Results  Component Value Date   HGBA1C 7.6 (A) 02/09/2022   Last thyroid functions Lab Results  Component Value Date   TSH 1.240 02/09/2022   Last vitamin D Lab Results  Component Value Date   VD25OH 37.3 02/09/2022   Last vitamin B12 and Folate Lab Results  Component Value Date   VITAMINB12 858 02/09/2022       Objective     Today's Vitals   07/15/22 1328  BP: 103/67  Pulse: 87  SpO2: 99%  Weight: 126 lb 12.8 oz (57.5 kg)  Height: _0  (1.6 m)   Body mass index is 22.46 kg/m.  BP Readings from Last 3 Encounters:  07/15/22 103/67  06/16/22 99/67  04/26/22 (Abnormal) 143/78    Wt Readings from Last 3 Encounters:  07/15/22 126 lb 12.8 oz (57.5 kg)  06/16/22 126 lb 6.4 oz (57.3 kg)  04/26/22 128 lb 1.4 oz (58.1 kg)    Physical Exam Vitals and nursing note reviewed.  Constitutional:      Appearance: Normal appearance. She is well-developed.  HENT:     Head: Normocephalic and atraumatic.     Nose: Nose normal.      Mouth/Throat:     Mouth: Mucous membranes are moist.     Pharynx: Oropharynx is clear.  Eyes:     Pupils: Pupils are equal, round, and reactive to light.  Cardiovascular:     Rate and Rhythm: Normal rate and regular rhythm.     Pulses: Normal pulses.     Heart sounds: Normal heart sounds.  Pulmonary:     Effort: Pulmonary effort is normal.     Breath sounds: Normal breath sounds.  Abdominal:     Palpations: Abdomen is soft.  Genitourinary:    Comments: Urine sample positive for glucose. Normal  urine microalbumin  Musculoskeletal:        General:  Normal range of motion.     Cervical back: Normal range of motion and neck supple.  Lymphadenopathy:     Cervical: No cervical adenopathy.  Skin:    General: Skin is warm and dry.     Capillary Refill: Capillary refill takes less than 2 seconds.  Neurological:     General: No focal deficit present.     Mental Status: She is alert and oriented to person, place, and time. Mental status is at baseline.  Psychiatric:        Mood and Affect: Mood normal.        Behavior: Behavior normal.        Thought Content: Thought content normal.        Judgment: Judgment normal.     Results for orders placed or performed in visit on 07/15/22  POCT UA - Microalbumin  Result Value Ref Range   Microalbumin Ur, POC 10 mg/L   Creatinine, POC 100 mg/dL   Albumin/Creatinine Ratio, Urine, POC 30   POCT URINALYSIS DIP (CLINITEK)  Result Value Ref Range   Color, UA yellow yellow   Clarity, UA clear clear   Glucose, UA >=1,000 (A) negative mg/dL   Bilirubin, UA negative negative   Ketones, POC UA negative negative mg/dL   Spec Grav, UA 1.010 1.010 - 1.025   Blood, UA negative negative   pH, UA 5.0 5.0 - 8.0   POC PROTEIN,UA negative negative, trace   Urobilinogen, UA 0.2 0.2 or 1.0 E.U./dL   Nitrite, UA Negative Negative   Leukocytes, UA Negative Negative    Assessment & Plan    1. Type 2 diabetes mellitus without complication, without  long-term current use of insulin (HCC) Normal urine microalbumin. Followed per endocrinology.  Continue visits as scheduled.  - POCT UA - Microalbumin  2. Urination frequency Negative urinalysis. Trial ditropan 5 mg. May be taken twice daily as needed. - oxybutynin (DITROPAN) 5 MG tablet; Take 1 tablet (5 mg total) by mouth 2 (two) times daily.  Dispense: 180 tablet; Refill: 1 - POCT URINALYSIS DIP (CLINITEK)  3. Yeast infection Treat with diflucan 150 mg. Take once. May repeat the dose in three days for persistent symptoms.  - fluconazole (DIFLUCAN) 150 MG tablet; Take 1 tablet po once. May repeat dose in 3 days as needed for persistent symptoms.  Dispense: 3 tablet; Refill: 0 - POCT URINALYSIS DIP (CLINITEK)  4. Syncope, unspecified syncope type Unclear etiology. Refer to neurology for further evaluation. May consider sleep study.  - Ambulatory referral to Neurology  5. Sleep walking disorder Unclear etiology. Refer to neurology for further evaluation. May consider sleep study.  - Ambulatory referral to Neurology  6. Mixed hyperlipidemia Continue lovastatin as prescribed    7. Generalized anxiety disorder Stable. Continue citalopram 20 mg daily.  - citalopram (CELEXA) 20 MG tablet; Take 1 tablet (20 mg total) by mouth daily.  Dispense: 90 tablet; Refill: 1  Problem List Items Addressed This Visit       Endocrine   Diabetes ([place]) - Primary (Chronic)   Relevant Orders   POCT UA - Microalbumin (Completed)     Nervous and Auditory   Sleep walking disorder   Relevant Orders   Ambulatory referral to Neurology     Other   Hyperlipidemia (Chronic)   Relevant Orders   POCT UA - Microalbumin (Completed)   Urination frequency   Relevant Medications   oxybutynin (DITROPAN) 5 MG tablet   Other Relevant Orders   POCT URINALYSIS DIP (  CLINITEK) (Completed)   Syncope   Relevant Orders   Ambulatory referral to Neurology   Yeast infection   Relevant Medications   fluconazole  (DIFLUCAN) 150 MG tablet   Other Relevant Orders   POCT URINALYSIS DIP (CLINITEK) (Completed)   Generalized anxiety disorder   Relevant Medications   citalopram (CELEXA) 20 MG tablet     Return in about 6 months (around 01/13/2023) for medicare wellness - new to medicare visit - will need to be 40 minute appointment.         Ronnell Freshwater, NP  [facility] at [facility] [phone] (phone) [phone] (fax)  [place]

## 2022-08-02 DIAGNOSIS — B379 Candidiasis, unspecified: Secondary | ICD-10-CM | POA: Insufficient documentation

## 2022-08-02 DIAGNOSIS — F411 Generalized anxiety disorder: Secondary | ICD-10-CM | POA: Insufficient documentation

## 2022-08-13 ENCOUNTER — Ambulatory Visit: Payer: BC Managed Care – PPO | Admitting: Nurse Practitioner

## 2022-08-13 ENCOUNTER — Encounter: Payer: Self-pay | Admitting: Nurse Practitioner

## 2022-08-13 VITALS — BP 106/64 | HR 80 | Ht 63.0 in | Wt 123.0 lb

## 2022-08-13 DIAGNOSIS — R059 Cough, unspecified: Secondary | ICD-10-CM | POA: Diagnosis not present

## 2022-08-13 DIAGNOSIS — J189 Pneumonia, unspecified organism: Secondary | ICD-10-CM | POA: Diagnosis not present

## 2022-08-13 LAB — POCT INFLUENZA A/B
Influenza A, POC: NEGATIVE
Influenza B, POC: NEGATIVE

## 2022-08-13 LAB — POCT RAPID STREP A (OFFICE): Rapid Strep A Screen: NEGATIVE

## 2022-08-13 MED ORDER — AZITHROMYCIN 250 MG PO TABS: ORAL_TABLET | ORAL | 0 refills | Status: DC

## 2022-08-13 MED ORDER — ALBUTEROL SULFATE HFA 108 (90 BASE) MCG/ACT IN AERS: 2.0000 | INHALATION_SPRAY | Freq: Four times a day (QID) | RESPIRATORY_TRACT | 1 refills | Status: DC | PRN

## 2022-08-13 NOTE — Progress Notes (Signed)
Established patient visit   Patient: Rebecca Robertson   DOB: June 07, 1956   66 y.o. Female  MRN: [id number] Visit Date: 08/13/2022  Chief Complaint  Patient presents with   Cough   Subjective    Cough This is a new problem. The current episode started in the past 7 days. The problem has been gradually worsening. The problem occurs constantly. The cough is Productive of sputum. Associated symptoms include chest pain, ear congestion, ear pain, a fever, postnasal drip, rhinorrhea, a sore throat, shortness of breath and wheezing. Treatments tried: has taken tylenol and ibuprofen, alternating. The treatment provided no relief.      Medications: Outpatient Medications Prior to Visit  Medication Sig   acetaminophen (TYLENOL) 500 MG tablet Take 2 tablets (1,000 mg total) by mouth every 6 (six) hours as needed.   BAYER CONTOUR NEXT TEST test strip Check blood sugar daily   citalopram (CELEXA) 20 MG tablet Take 1 tablet (20 mg total) by mouth daily.   dicyclomine (BENTYL) 20 MG tablet Take 20 mg by mouth 3 (three) times daily as needed.   fluconazole (DIFLUCAN) 150 MG tablet Take 1 tablet po once. May repeat dose in 3 days as needed for persistent symptoms.   Fluocinolone Acetonide Body 0.01 % OIL Apply 1 application topically 2 (two) times daily as needed.   lovastatin (MEVACOR) 40 MG tablet Take 1 tablet po QD with supper.   mupirocin cream (BACTROBAN) 2 % Apply 1 application topically 2 (two) times daily.   nystatin ointment (MYCOSTATIN) Apply 1 application topically 2 (two) times daily.   ondansetron (ZOFRAN-ODT) 4 MG disintegrating tablet Take 1 tablet (4 mg total) by mouth every 8 (eight) hours as needed for nausea or vomiting.   oxybutynin (DITROPAN) 5 MG tablet Take 1 tablet (5 mg total) by mouth 2 (two) times daily.   Semaglutide,0.25 or 0.'5MG'$ /DOS, 2 MG/1.5ML SOPN Inject 0.25 mg into the skin every Sunday.   SYNJARDY 12.12-998 MG TABS Take 1 tablet 2 (two) times daily by mouth.     TRULANCE 3 MG TABS Take 1 tablet by mouth daily.   vitamin B-12 (CYANOCOBALAMIN) 1000 MCG tablet Take 1,000 mcg by mouth every other day.   [DISCONTINUED] albuterol (VENTOLIN HFA) 108 (90 Base) MCG/ACT inhaler INHALE 2 PUFFS INTO THE LUNGS EVERY 6 HOURS AS NEEDED FOR WHEEZING OR SHORTNESS OF BREATH.   [DISCONTINUED] triamcinolone ointment (KENALOG) 0.1 % Apply 1 application topically 2 (two) times daily.   No facility-administered medications prior to visit.    Review of Systems  Constitutional:  Positive for fever.  HENT:  Positive for ear pain, postnasal drip, rhinorrhea and sore throat.   Respiratory:  Positive for cough, shortness of breath and wheezing.   Cardiovascular:  Positive for chest pain.     Objective     Today's Vitals   08/13/22 1614  BP: 106/64  Pulse: 80  SpO2: 98%  Weight: 123 lb (55.8 kg)  Height: '5\' 3"'$  (1.6 m)   Body mass index is 21.79 kg/m.   Physical Exam Vitals and nursing note reviewed.  Constitutional:      Appearance: Normal appearance. She is well-developed. She is ill-appearing.  HENT:     Head: Normocephalic.     Right Ear: Hearing, tympanic membrane, ear canal and external ear normal.     Left Ear: Hearing, tympanic membrane, ear canal and external ear normal.     Nose: Congestion present.     Right Turbinates: Swollen.     Left Turbinates:  Swollen.     Mouth/Throat:     Pharynx: Posterior oropharyngeal erythema present.  Eyes:     Pupils: Pupils are equal, round, and reactive to light.  Cardiovascular:     Rate and Rhythm: Normal rate and regular rhythm.     Pulses: Normal pulses.     Heart sounds: Normal heart sounds.  Pulmonary:     Effort: Pulmonary effort is normal.     Breath sounds: Wheezing present.     Comments: Ngested, non productive cough noted during today's visit.  Abdominal:     Palpations: Abdomen is soft.  Musculoskeletal:        General: Normal range of motion.     Cervical back: Normal range of motion and neck  supple.  Lymphadenopathy:     Cervical: Cervical adenopathy present.  Skin:    General: Skin is warm and dry.     Capillary Refill: Capillary refill takes less than 2 seconds.  Neurological:     General: No focal deficit present.     Mental Status: She is alert and oriented to person, place, and time.  Psychiatric:        Mood and Affect: Mood normal.        Behavior: Behavior normal.        Thought Content: Thought content normal.        Judgment: Judgment normal.       Results for orders placed or performed in visit on 08/13/22  POCT rapid strep A  Result Value Ref Range   Rapid Strep A Screen Negative Negative  POCT Influenza A/B  Result Value Ref Range   Influenza A, POC Negative Negative   Influenza B, POC Negative Negative    Assessment & Plan     1. Community acquired pneumonia, unspecified laterality Testing for flu and stgrep negative during today's visit.  Start z-pack. Take as directed for 5 days. Rest and increase fluids. Continue using OTC medication to control symptoms.   - albuterol (VENTOLIN HFA) 108 (90 Base) MCG/ACT inhaler; Inhale 2 puffs into the lungs every 6 (six) hours as needed for wheezing or shortness of breath.  Dispense: 8.5 g; Refill: 1  2. Cough, unspecified type Testing for flu and stgrep negative during today's visit.  Start z-pack. Take as directed for 5 days. Add ventolin inhaler. Use 2 inhalations up to four times daily as needed for cough, wheezing.  - POCT rapid strep A - POCT Influenza A/B   Return for prn worsening or persistent symptoms.        Ronnell Freshwater, NP  [facility] at [facility] [phone] (phone) [phone] (fax)  [place]

## 2022-08-18 ENCOUNTER — Other Ambulatory Visit: Payer: Self-pay | Admitting: Nurse Practitioner

## 2022-08-18 ENCOUNTER — Telehealth: Payer: Self-pay

## 2022-08-18 ENCOUNTER — Ambulatory Visit
Admission: RE | Admit: 2022-08-18 | Discharge: 2022-08-18 | Disposition: A | Discharge status: Home / Self Care | Payer: 59 | Source: Ambulatory Visit | Attending: Nurse Practitioner | Admitting: Nurse Practitioner

## 2022-08-18 DIAGNOSIS — R918 Other nonspecific abnormal finding of lung field: Secondary | ICD-10-CM | POA: Diagnosis not present

## 2022-08-18 DIAGNOSIS — R059 Cough, unspecified: Secondary | ICD-10-CM

## 2022-08-18 DIAGNOSIS — J189 Pneumonia, unspecified organism: Secondary | ICD-10-CM

## 2022-08-18 MED ORDER — CEFUROXIME AXETIL 500 MG PO TABS: 500.0000 mg | ORAL_TABLET | Freq: Two times a day (BID) | ORAL | 0 refills | Status: DC

## 2022-08-18 NOTE — Telephone Encounter (Signed)
Please let the patient know that I started ceftin 500 mg twice daily for 10 days. The z-pack should also stay with her for additional 5 days. Chest x-ray ordered. This should be done at [place] and can be done at anytime. If she continues to feel worse, she should be seen in Urgent care or the ED.  Thanks so much.   -HB

## 2022-08-18 NOTE — Telephone Encounter (Signed)
Pt returned called and I advised her of Nira Conn Notes stating  let the patient know that I started ceftin 500 mg twice daily for 10 days. The z-pack should also stay with her for additional 5 days. Chest x-ray ordered. This should be done at [place] and can be done at anytime. If she continues to feel worse, she should be seen in Urgent care or the ED.   Pt understood and was going to have xray done soon and will pick up the medication from PD.

## 2022-08-18 NOTE — Telephone Encounter (Signed)
Pt states that she was advised to call back today if she didn't feel any better. Pt states she feels like she has been hit by a truck. Pt completed the Abx that was given on 08/13/22.  Sxs chest hurt from coughing, congested, fever up and down with meds, body achy.  Pt is calling requesting recommendation of what she should do next.  Please advise

## 2022-08-18 NOTE — Progress Notes (Signed)
Patient continues to have URI/flu type symptoms. Just completed z-pack. Start ceftin 500 mg twice daily for 10 days. Chest x-ray ordered at [place]. Can be done anytime.

## 2022-08-18 NOTE — Telephone Encounter (Signed)
Ok thanks 

## 2022-08-18 NOTE — Progress Notes (Signed)
Please let the patient know that the chest xray is concerning for pneumonia. We changed antibiotics this morning. She should take this twice daily as prescribed. She should also use inhaler as needed and as prescribed. If she continues to have shortness of breath, she should be seen in ED.  Thanks so much.   -HB

## 2022-08-18 NOTE — Telephone Encounter (Signed)
Called pt LVM to contact the office °

## 2022-08-24 ENCOUNTER — Telehealth: Payer: Self-pay | Admitting: *Deleted

## 2022-08-24 NOTE — Telephone Encounter (Signed)
Pt left message saying that she is on her 2nd antibiotic and has 3 more days left and she is still feeling bad, she has a chest pain under her left breast and it is really painful when she is lying down.  Also she broke out in a severe rash and tried some mupirocin ointment and it did not help.  Please advise.

## 2022-08-25 ENCOUNTER — Other Ambulatory Visit: Payer: Self-pay | Admitting: Nurse Practitioner

## 2022-08-25 DIAGNOSIS — R0781 Pleurodynia: Secondary | ICD-10-CM

## 2022-08-25 DIAGNOSIS — J189 Pneumonia, unspecified organism: Secondary | ICD-10-CM

## 2022-08-25 MED ORDER — METHYLPREDNISOLONE 4 MG PO TBPK: ORAL_TABLET | ORAL | 0 refills | Status: DC

## 2022-08-25 MED ORDER — AZITHROMYCIN 250 MG PO TABS: ORAL_TABLET | ORAL | 0 refills | Status: DC

## 2022-08-25 NOTE — Telephone Encounter (Signed)
Please let the patient know I would like to follow this antibiotic with z-pack. Take as directed for 5 days. I have also added a medrol taper. Take as directed for 6 days. This will probably raise her blood sugar. She should keep a close eye on this and we might need to adjust medication to keep sugar controlled. I have also placed a new order for chest x-ray. It concerns me that the pain is getting worse and not better. I will let her know when we get results.  Thanks so much.   -HB

## 2022-08-25 NOTE — Telephone Encounter (Signed)
Tried to contact pt to inform her of below and message says call cannot be completed at this time.  I am sending this message to patients MyChart so hopefully she will see it.  Kelsen Celona Zimmerman Rumple, CMA

## 2022-08-26 ENCOUNTER — Ambulatory Visit
Admission: RE | Admit: 2022-08-26 | Discharge: 2022-08-26 | Disposition: A | Discharge status: Home / Self Care | Payer: 59 | Source: Ambulatory Visit | Attending: Nurse Practitioner | Admitting: Nurse Practitioner

## 2022-08-26 DIAGNOSIS — R918 Other nonspecific abnormal finding of lung field: Secondary | ICD-10-CM | POA: Diagnosis not present

## 2022-08-26 DIAGNOSIS — J189 Pneumonia, unspecified organism: Secondary | ICD-10-CM

## 2022-08-26 DIAGNOSIS — R0781 Pleurodynia: Secondary | ICD-10-CM

## 2022-08-26 DIAGNOSIS — J849 Interstitial pulmonary disease, unspecified: Secondary | ICD-10-CM | POA: Diagnosis not present

## 2022-08-27 NOTE — Progress Notes (Signed)
Please let the patient know that her chest x-ray shows persistent, but improved pneumonia. She should finish up the z-pack. Continue over the counter medications to help with symptoms. We should repeat the x-ray one more time in about 2 weeks.  Thanks so much.   -HB

## 2022-09-07 ENCOUNTER — Ambulatory Visit (INDEPENDENT_AMBULATORY_CARE_PROVIDER_SITE_OTHER): Payer: Medicare (Managed Care) | Admitting: Nurse Practitioner

## 2022-09-07 ENCOUNTER — Ambulatory Visit
Admission: RE | Admit: 2022-09-07 | Discharge: 2022-09-07 | Disposition: A | Discharge status: Home / Self Care | Payer: Medicare (Managed Care) | Source: Ambulatory Visit | Attending: Nurse Practitioner | Admitting: Nurse Practitioner

## 2022-09-07 ENCOUNTER — Encounter: Payer: Self-pay | Admitting: Nurse Practitioner

## 2022-09-07 VITALS — BP 94/64 | HR 62 | Ht 63.0 in | Wt 121.4 lb

## 2022-09-07 DIAGNOSIS — Z8701 Personal history of pneumonia (recurrent): Secondary | ICD-10-CM | POA: Diagnosis not present

## 2022-09-07 DIAGNOSIS — E119 Type 2 diabetes mellitus without complications: Secondary | ICD-10-CM

## 2022-09-07 DIAGNOSIS — J189 Pneumonia, unspecified organism: Secondary | ICD-10-CM | POA: Diagnosis not present

## 2022-09-07 DIAGNOSIS — L299 Pruritus, unspecified: Secondary | ICD-10-CM | POA: Diagnosis not present

## 2022-09-07 DIAGNOSIS — R21 Rash and other nonspecific skin eruption: Secondary | ICD-10-CM | POA: Insufficient documentation

## 2022-09-07 MED ORDER — HYDROXYZINE HCL 10 MG PO TABS: 10.0000 mg | ORAL_TABLET | Freq: Every evening | ORAL | 0 refills | Status: DC | PRN

## 2022-09-07 MED ORDER — PREDNISONE 10 MG (21) PO TBPK: ORAL_TABLET | ORAL | 0 refills | Status: DC

## 2022-09-07 MED ORDER — TRIAMCINOLONE ACETONIDE 0.025 % EX CREA: 1.0000 | TOPICAL_CREAM | Freq: Two times a day (BID) | CUTANEOUS | 2 refills | Status: AC

## 2022-09-07 NOTE — Progress Notes (Signed)
Established patient visit   Patient: Rebecca Robertson   DOB: 12-Mar-1956   67 y.o. Female  MRN: [id number] Visit Date: 09/07/2022  Chief Complaint  Patient presents with   Rash   Subjective    The patient has been recently treated for pneumonia. Had to do two rounds of antibiotics and then steroid taper.  -feels very tired -has moderate fatigue  -decreased appetite.  -has been using bactroban ointment which is not helping at all.   Rash This is a new problem. The problem has been gradually improving since onset. The affected locations include the abdomen and groin. The rash is characterized by blistering, pain and redness. She was exposed to a new medication. Associated symptoms include anorexia, coughing and fatigue. Pertinent negatives include no congestion, diarrhea, fever, rhinorrhea, shortness of breath, sore throat or vomiting. Past treatments include antibiotic cream. The treatment provided no relief.      Medications: Outpatient Medications Prior to Visit  Medication Sig   acetaminophen (TYLENOL) 500 MG tablet Take 2 tablets (1,000 mg total) by mouth every 6 (six) hours as needed.   albuterol (VENTOLIN HFA) 108 (90 Base) MCG/ACT inhaler Inhale 2 puffs into the lungs every 6 (six) hours as needed for wheezing or shortness of breath.   azithromycin (ZITHROMAX) 250 MG tablet z-pack - take as directed for 5 days   BAYER CONTOUR NEXT TEST test strip Check blood sugar daily   cefUROXime (CEFTIN) 500 MG tablet Take 1 tablet (500 mg total) by mouth 2 (two) times daily with a meal.   citalopram (CELEXA) 20 MG tablet Take 1 tablet (20 mg total) by mouth daily.   CREON 36000-114000 units CPEP capsule Take by mouth.   dicyclomine (BENTYL) 20 MG tablet Take 20 mg by mouth 3 (three) times daily as needed.   fluconazole (DIFLUCAN) 150 MG tablet Take 1 tablet po once. May repeat dose in 3 days as needed for persistent symptoms.   Fluocinolone Acetonide Body 0.01 % OIL Apply 1 application  topically 2 (two) times daily as needed.   lovastatin (MEVACOR) 40 MG tablet Take 1 tablet po QD with supper.   mupirocin cream (BACTROBAN) 2 % Apply 1 application topically 2 (two) times daily.   nystatin ointment (MYCOSTATIN) Apply 1 application topically 2 (two) times daily.   ondansetron (ZOFRAN-ODT) 4 MG disintegrating tablet Take 1 tablet (4 mg total) by mouth every 8 (eight) hours as needed for nausea or vomiting.   oxybutynin (DITROPAN) 5 MG tablet Take 1 tablet (5 mg total) by mouth 2 (two) times daily.   Semaglutide,0.25 or 0.'5MG'$ /DOS, 2 MG/1.5ML SOPN Inject 0.25 mg into the skin every Sunday.   SYNJARDY 12.12-998 MG TABS Take 1 tablet 2 (two) times daily by mouth.    TRULANCE 3 MG TABS Take 1 tablet by mouth daily.   vitamin B-12 (CYANOCOBALAMIN) 1000 MCG tablet Take 1,000 mcg by mouth every other day.   XIFAXAN 550 MG TABS tablet Take 550 mg by mouth 3 (three) times daily.   [DISCONTINUED] methylPREDNISolone (MEDROL) 4 MG TBPK tablet Take by mouth as directed for 6 days   [DISCONTINUED] triamcinolone ointment (KENALOG) 0.1 % Apply 1 application topically 2 (two) times daily.   No facility-administered medications prior to visit.    Review of Systems  Constitutional:  Positive for appetite change and fatigue. Negative for activity change, chills and fever.  HENT:  Negative for congestion, postnasal drip, rhinorrhea, sinus pressure, sinus pain, sneezing and sore throat.   Eyes: Negative.  Respiratory:  Positive for cough. Negative for chest tightness, shortness of breath and wheezing.   Cardiovascular:  Negative for chest pain and palpitations.  Gastrointestinal:  Positive for anorexia. Negative for abdominal pain, constipation, diarrhea, nausea and vomiting.  Endocrine: Negative for cold intolerance, heat intolerance, polydipsia and polyuria.       Blood sugars doing well even with prednisone taper from pneumonia.   Genitourinary:  Negative for dyspareunia, dysuria, flank pain,  frequency and urgency.  Musculoskeletal:  Negative for arthralgias, back pain and myalgias.  Skin:  Positive for rash.       Itchy rash on abdomen. Scabs over after itching. No where else on body.  Lymph noes on groin area are enlarged and tender.   Allergic/Immunologic: Negative for environmental allergies.  Neurological:  Negative for dizziness, weakness and headaches.  Hematological:  Negative for adenopathy.  Psychiatric/Behavioral:  The patient is not nervous/anxious.      Objective     Today's Vitals   09/07/22 1041  BP: 94/64  Pulse: 62  SpO2: 96%  Weight: 121 lb 6.4 oz (55.1 kg)  Height: '5\' 3"'$  (1.6 m)   Body mass index is 21.51 kg/m.   Physical Exam Vitals and nursing note reviewed.  Constitutional:      Appearance: Normal appearance. She is well-developed.  HENT:     Head: Normocephalic.     Nose: Nose normal.     Mouth/Throat:     Mouth: Mucous membranes are moist.     Pharynx: Oropharynx is clear.  Eyes:     Extraocular Movements: Extraocular movements intact.     Conjunctiva/sclera: Conjunctivae normal.     Pupils: Pupils are equal, round, and reactive to light.  Cardiovascular:     Rate and Rhythm: Normal rate and regular rhythm.     Pulses: Normal pulses.     Heart sounds: Normal heart sounds.  Pulmonary:     Effort: Pulmonary effort is normal.     Breath sounds: Normal breath sounds.     Comments: Mild, non productive cough noted  Abdominal:     Palpations: Abdomen is soft.  Genitourinary:    Comments:  Lymph node enlargement in bilateral groin area. Musculoskeletal:        General: Normal range of motion.     Cervical back: Normal range of motion and neck supple.  Lymphadenopathy:     Cervical: No cervical adenopathy.  Skin:    General: Skin is warm and dry.     Capillary Refill: Capillary refill takes less than 2 seconds.     Comments: Small, red, circular lesions spread across the abdomen. Some lesions are scabbed. All are dry. No drainage  noted at this time. No evidence of infection or cellulitis noted.   Neurological:     General: No focal deficit present.     Mental Status: She is alert and oriented to person, place, and time.  Psychiatric:        Mood and Affect: Mood normal.        Behavior: Behavior normal.        Thought Content: Thought content normal.        Judgment: Judgment normal.      Assessment & Plan     1. Rash and nonspecific skin eruption Unclear etiology. Only located across abdomen. Start prednisone taper. Take as directed for 6 days. Appy triamcinolone cream to effected areas twice daily as needed. May take hydroxyzine 10 mg at bedtime as needed for itching. Advised she  use with caution as it may cause dizziness or drowsiness. She voiced understanding.  - triamcinolone (KENALOG) 0.025 % cream; Apply 1 Application topically 2 (two) times daily.  Dispense: 80 g; Refill: 2 - predniSONE (STERAPRED UNI-PAK 21 TAB) 10 MG (21) TBPK tablet; 6 day taper - take by mouth as directed for 6 days  Dispense: 21 tablet; Refill: 0 - hydrOXYzine (ATARAX) 10 MG tablet; Take 1 tablet (10 mg total) by mouth at bedtime as needed.  Dispense: 30 tablet; Refill: 0  2. Pruritus May take hydroxyzine 10 mg at bedtime as needed for itching. Advised she use with caution as it may cause dizziness or drowsiness. She voiced understanding.  - hydrOXYzine (ATARAX) 10 MG tablet; Take 1 tablet (10 mg total) by mouth at bedtime as needed.  Dispense: 30 tablet; Refill: 0  3. Community acquired pneumonia, unspecified laterality Repeat chest x-ray after 3rd round of antibiotics and 2nd round steroids - DG Chest 2 View; Future  4. Type 2 diabetes mellitus without complication, without long-term current use of insulin (HCC) Continue all diabetic medications as prescribed. Check blood sugars more often while on prednisone. Understands that taking prednisone may increase blood sugars.   Return for prn worsening or persistent symptoms.         Ronnell Freshwater, NP  [facility] at [facility] [phone] (phone) [phone] (fax)  [place]

## 2022-09-08 ENCOUNTER — Telehealth: Payer: Self-pay | Admitting: *Deleted

## 2022-09-08 NOTE — Telephone Encounter (Signed)
Check at 5:22pm and results were not yet available.

## 2022-09-08 NOTE — Telephone Encounter (Signed)
Pt calling to check on xray results from yesterday. Marland Kitchen

## 2022-09-09 NOTE — Telephone Encounter (Signed)
Thank you :)

## 2022-09-09 NOTE — Telephone Encounter (Signed)
Pt called and asked about results. Provider reviewed and said it was ok to give her the information from the xray.  Patient was thankful for the information. Rebecca Robertson, CMA

## 2022-09-23 DIAGNOSIS — L259 Unspecified contact dermatitis, unspecified cause: Secondary | ICD-10-CM | POA: Diagnosis not present

## 2022-09-23 DIAGNOSIS — L309 Dermatitis, unspecified: Secondary | ICD-10-CM | POA: Diagnosis not present

## 2022-09-23 DIAGNOSIS — S30861A Insect bite (nonvenomous) of abdominal wall, initial encounter: Secondary | ICD-10-CM | POA: Diagnosis not present

## 2022-10-08 ENCOUNTER — Other Ambulatory Visit: Payer: Self-pay | Admitting: Nurse Practitioner

## 2022-10-08 DIAGNOSIS — Z1231 Encounter for screening mammogram for malignant neoplasm of breast: Secondary | ICD-10-CM

## 2022-10-20 ENCOUNTER — Ambulatory Visit (INDEPENDENT_AMBULATORY_CARE_PROVIDER_SITE_OTHER): Payer: Medicare (Managed Care) | Admitting: Nurse Practitioner

## 2022-10-20 ENCOUNTER — Encounter: Payer: Self-pay | Admitting: Nurse Practitioner

## 2022-10-20 VITALS — BP 112/73 | HR 70 | Ht 63.0 in | Wt 115.0 lb

## 2022-10-20 DIAGNOSIS — R059 Cough, unspecified: Secondary | ICD-10-CM | POA: Diagnosis not present

## 2022-10-20 DIAGNOSIS — J189 Pneumonia, unspecified organism: Secondary | ICD-10-CM

## 2022-10-20 DIAGNOSIS — L299 Pruritus, unspecified: Secondary | ICD-10-CM

## 2022-10-20 DIAGNOSIS — R229 Localized swelling, mass and lump, unspecified: Secondary | ICD-10-CM

## 2022-10-20 DIAGNOSIS — R21 Rash and other nonspecific skin eruption: Secondary | ICD-10-CM | POA: Diagnosis not present

## 2022-10-20 NOTE — Progress Notes (Signed)
Established patient visit   Patient: Rebecca Robertson   DOB: 07/20/1956   67 y.o. Female  MRN: [id number] Visit Date: 10/20/2022  Chief Complaint  Patient presents with   Groin Swelling   Subjective    HPI  Acute visit -persistent lymph node swelling in right groin. Became present during episodes of pneumonia at the endo of 2023 and beginning of 2024.  -continues to have cough.  -has discomfort in upper back area.  -currently taking doxycycline due to rash which developed.  -biopsies done per dermatology which were negative.  -has persistent lymph node enlargement in right groin.  --tender and getting larger.  --this is has been present since December with initial diagnosis if pneumonia   Medications: Outpatient Medications Prior to Visit  Medication Sig   acetaminophen (TYLENOL) 500 MG tablet Take 2 tablets (1,000 mg total) by mouth every 6 (six) hours as needed.   albuterol (VENTOLIN HFA) 108 (90 Base) MCG/ACT inhaler Inhale 2 puffs into the lungs every 6 (six) hours as needed for wheezing or shortness of breath.   azithromycin (ZITHROMAX) 250 MG tablet z-pack - take as directed for 5 days   BAYER CONTOUR NEXT TEST test strip Check blood sugar daily   cefUROXime (CEFTIN) 500 MG tablet Take 1 tablet (500 mg total) by mouth 2 (two) times daily with a meal.   citalopram (CELEXA) 20 MG tablet Take 1 tablet (20 mg total) by mouth daily.   CREON 36000-114000 units CPEP capsule Take by mouth.   dicyclomine (BENTYL) 20 MG tablet Take 20 mg by mouth 3 (three) times daily as needed.   fluconazole (DIFLUCAN) 150 MG tablet Take 1 tablet po once. May repeat dose in 3 days as needed for persistent symptoms.   Fluocinolone Acetonide Body 0.01 % OIL Apply 1 application topically 2 (two) times daily as needed.   hydrOXYzine (ATARAX) 10 MG tablet Take 1 tablet (10 mg total) by mouth at bedtime as needed.   lovastatin (MEVACOR) 40 MG tablet Take 1 tablet po QD with supper.   mupirocin cream  (BACTROBAN) 2 % Apply 1 application topically 2 (two) times daily.   nystatin ointment (MYCOSTATIN) Apply 1 application topically 2 (two) times daily.   ondansetron (ZOFRAN-ODT) 4 MG disintegrating tablet Take 1 tablet (4 mg total) by mouth every 8 (eight) hours as needed for nausea or vomiting.   oxybutynin (DITROPAN) 5 MG tablet Take 1 tablet (5 mg total) by mouth 2 (two) times daily.   predniSONE (STERAPRED UNI-PAK 21 TAB) 10 MG (21) TBPK tablet 6 day taper - take by mouth as directed for 6 days   Semaglutide,0.25 or 0.'5MG'$ /DOS, 2 MG/1.5ML SOPN Inject 0.25 mg into the skin every Sunday.   SYNJARDY 12.12-998 MG TABS Take 1 tablet 2 (two) times daily by mouth.    triamcinolone (KENALOG) 0.025 % cream Apply 1 Application topically 2 (two) times daily.   vitamin B-12 (CYANOCOBALAMIN) 1000 MCG tablet Take 1,000 mcg by mouth every other day.   XIFAXAN 550 MG TABS tablet Take 550 mg by mouth 3 (three) times daily.   TRULANCE 3 MG TABS Take 1 tablet by mouth daily. (Patient not taking: Reported on 10/20/2022)   No facility-administered medications prior to visit.    Review of Systems See HPI      Objective     Today's Vitals   10/20/22 1356  BP: 112/73  Pulse: 70  SpO2: 99%  Weight: 115 lb (52.2 kg)  Height: '5\' 3"'$  (1.6 m)  Body mass index is 20.37 kg/m.   Physical Exam Vitals and nursing note reviewed.  Constitutional:      Appearance: Normal appearance. She is well-developed.  HENT:     Head: Normocephalic.     Nose: Nose normal.     Mouth/Throat:     Mouth: Mucous membranes are moist.     Pharynx: Oropharynx is clear.  Eyes:     Extraocular Movements: Extraocular movements intact.     Conjunctiva/sclera: Conjunctivae normal.     Pupils: Pupils are equal, round, and reactive to light.  Cardiovascular:     Rate and Rhythm: Normal rate and regular rhythm.     Pulses: Normal pulses.     Heart sounds: Normal heart sounds.  Pulmonary:     Effort: Pulmonary effort is normal.      Breath sounds: Normal breath sounds.     Comments: Mild, non productive cough noted. Some soft, expiratory wheezing noted  Abdominal:     Palpations: Abdomen is soft.  Genitourinary:    Comments: Persistent right groin lymph node enlargement.  Slightly tender to palpate. Not fixed. Slightly larger than previous visit.  Musculoskeletal:        General: Normal range of motion.     Cervical back: Normal range of motion and neck supple.  Lymphadenopathy:     Cervical: No cervical adenopathy.  Skin:    General: Skin is warm and dry.     Capillary Refill: Capillary refill takes less than 2 seconds.     Comments: Small, red, circular lesions spread across the abdomen. Some lesions are scabbed. All are dry. No drainage noted at this time. No evidence of infection or cellulitis noted.   Neurological:     General: No focal deficit present.     Mental Status: She is alert and oriented to person, place, and time.  Psychiatric:        Mood and Affect: Mood normal.        Behavior: Behavior normal.        Thought Content: Thought content normal.        Judgment: Judgment normal.      Assessment & Plan     Local superficial swelling, mass or lump Assessment & Plan: Right groin swelling which has been persistent.  Will get ultrasound of the  groin for further evaluation. Will refer to  surgery as indicated.   Orders: -     US SOFT TISSUE LOWER EXTREMITY LIMITED RIGHT (NON-VASCULAR); Future  Rash and nonspecific skin eruption Assessment & Plan: Continue use of kenalog cream twice daily as needed   Orders: -     Ambulatory referral to Allergy  Pruritus Assessment & Plan: Take hydroxyzine as needed and as prescribed   Orders: -     Ambulatory referral to Allergy  Community acquired pneumonia of left lower lobe of lung Assessment & Plan: Will get Chest CT for further evaluation   Orders: -     CT CHEST WO CONTRAST; Future  Cough, unspecified type Assessment &  Plan: Persistent despite resolution of pneumonia. Refer to allergy/immunology for further evaluation and treatment.   Orders: -     CT CHEST WO CONTRAST; Future     Return for as scheduled. needs CBC in next few days i nAM. .        Carlean Jews, NP  [facility] at [facility] [phone] (phone) [phone] (fax)  [facility]

## 2022-10-21 ENCOUNTER — Other Ambulatory Visit: Payer: Medicare (Managed Care)

## 2022-10-21 DIAGNOSIS — K8681 Exocrine pancreatic insufficiency: Secondary | ICD-10-CM | POA: Diagnosis not present

## 2022-10-21 DIAGNOSIS — K5900 Constipation, unspecified: Secondary | ICD-10-CM | POA: Diagnosis not present

## 2022-10-21 DIAGNOSIS — R229 Localized swelling, mass and lump, unspecified: Secondary | ICD-10-CM

## 2022-10-21 DIAGNOSIS — K219 Gastro-esophageal reflux disease without esophagitis: Secondary | ICD-10-CM | POA: Diagnosis not present

## 2022-10-21 DIAGNOSIS — K589 Irritable bowel syndrome without diarrhea: Secondary | ICD-10-CM | POA: Diagnosis not present

## 2022-10-22 LAB — CBC WITH DIFFERENTIAL/PLATELET
Basophils Absolute: 0 10*3/uL (ref 0.0–0.2)
Basos: 1 %
EOS (ABSOLUTE): 0.1 10*3/uL (ref 0.0–0.4)
Eos: 1 %
Hematocrit: 42.7 % (ref 34.0–46.6)
Hemoglobin: 14 g/dL (ref 11.1–15.9)
Immature Grans (Abs): 0 10*3/uL (ref 0.0–0.1)
Immature Granulocytes: 0 %
Lymphocytes Absolute: 2.3 10*3/uL (ref 0.7–3.1)
Lymphs: 30 %
MCH: 29.3 pg (ref 26.6–33.0)
MCHC: 32.8 g/dL (ref 31.5–35.7)
MCV: 89 fL (ref 79–97)
Monocytes Absolute: 0.6 10*3/uL (ref 0.1–0.9)
Monocytes: 8 %
Neutrophils Absolute: 4.6 10*3/uL (ref 1.4–7.0)
Neutrophils: 60 %
Platelets: 268 10*3/uL (ref 150–450)
RBC: 4.78 x10E6/uL (ref 3.77–5.28)
RDW: 12.4 % (ref 11.7–15.4)
WBC: 7.5 10*3/uL (ref 3.4–10.8)

## 2022-11-03 ENCOUNTER — Ambulatory Visit
Admission: RE | Admit: 2022-11-03 | Discharge: 2022-11-03 | Disposition: A | Discharge status: Home / Self Care | Payer: Medicare (Managed Care) | Source: Ambulatory Visit | Attending: Nurse Practitioner | Admitting: Nurse Practitioner

## 2022-11-03 DIAGNOSIS — R229 Localized swelling, mass and lump, unspecified: Secondary | ICD-10-CM

## 2022-11-03 DIAGNOSIS — R590 Localized enlarged lymph nodes: Secondary | ICD-10-CM | POA: Diagnosis not present

## 2022-11-04 ENCOUNTER — Ambulatory Visit
Admission: RE | Admit: 2022-11-04 | Discharge: 2022-11-04 | Disposition: A | Discharge status: Home / Self Care | Payer: Medicare (Managed Care) | Source: Ambulatory Visit | Attending: Nurse Practitioner | Admitting: Nurse Practitioner

## 2022-11-04 DIAGNOSIS — J189 Pneumonia, unspecified organism: Secondary | ICD-10-CM | POA: Diagnosis not present

## 2022-11-04 DIAGNOSIS — J479 Bronchiectasis, uncomplicated: Secondary | ICD-10-CM | POA: Diagnosis not present

## 2022-11-04 DIAGNOSIS — R059 Cough, unspecified: Secondary | ICD-10-CM | POA: Diagnosis not present

## 2022-11-10 ENCOUNTER — Telehealth: Payer: Self-pay

## 2022-11-10 NOTE — Telephone Encounter (Signed)
Pt is calling to get the results from Imaging.

## 2022-11-11 ENCOUNTER — Other Ambulatory Visit: Payer: Self-pay | Admitting: Nurse Practitioner

## 2022-11-11 DIAGNOSIS — R229 Localized swelling, mass and lump, unspecified: Secondary | ICD-10-CM

## 2022-11-11 NOTE — Telephone Encounter (Signed)
I have placed referral to [place] [facility].

## 2022-11-11 NOTE — Telephone Encounter (Signed)
Please let the patient know that chest CT does show some scarring of the lungs and a stable pulmonary nodule. This is something we should monitor yearly with a repeat low dose CT scan.  The ultrasound of right groin did show a 2.5 cm solid mass. It is likely a lymph node. If she wants to, I can send her to surgery for further evaluation. I will do referral if she is agreeable.  Thanks  -HB

## 2022-11-11 NOTE — Progress Notes (Signed)
Will refer to surgery if patient is agreeable.

## 2022-11-11 NOTE — Progress Notes (Signed)
Report to patient about scarring and stable pulmonary nodule. Will discuss with her about bowel distribution at next visit.

## 2022-11-11 NOTE — Telephone Encounter (Signed)
Pt calling again stating that she would like the results from the 2 imaging appointments she had to see if there is any concern and next steps if any. Please contact pt with results as soon as you can. Ege Muckey Zimmerman Rumple, CMA

## 2022-11-11 NOTE — Progress Notes (Signed)
Blood count is normal

## 2022-11-11 NOTE — Telephone Encounter (Signed)
Pt informed of below and she is agreeable to go to general surgery for evaluation

## 2022-11-17 NOTE — Telephone Encounter (Signed)
Referral faxed to [facility].  °

## 2022-11-22 DIAGNOSIS — R229 Localized swelling, mass and lump, unspecified: Secondary | ICD-10-CM | POA: Insufficient documentation

## 2022-11-22 NOTE — Assessment & Plan Note (Signed)
Will get Chest CT for further evaluation

## 2022-11-22 NOTE — Assessment & Plan Note (Signed)
Persistent despite resolution of pneumonia. Refer to allergy/immunology for further evaluation and treatment.

## 2022-11-22 NOTE — Assessment & Plan Note (Signed)
Right groin swelling which has been persistent.  Will get ultrasound of the  groin for further evaluation. Will refer to  surgery as indicated.

## 2022-11-22 NOTE — Assessment & Plan Note (Signed)
Continue use of kenalog cream twice daily as needed

## 2022-11-22 NOTE — Assessment & Plan Note (Signed)
Take hydroxyzine as needed and as prescribed

## 2022-12-17 ENCOUNTER — Other Ambulatory Visit: Payer: Self-pay | Admitting: Nurse Practitioner

## 2022-12-17 DIAGNOSIS — E782 Mixed hyperlipidemia: Secondary | ICD-10-CM

## 2022-12-18 ENCOUNTER — Other Ambulatory Visit: Payer: Self-pay | Admitting: Nurse Practitioner

## 2022-12-18 DIAGNOSIS — E782 Mixed hyperlipidemia: Secondary | ICD-10-CM

## 2022-12-28 DIAGNOSIS — E119 Type 2 diabetes mellitus without complications: Secondary | ICD-10-CM | POA: Diagnosis not present

## 2022-12-28 DIAGNOSIS — K8681 Exocrine pancreatic insufficiency: Secondary | ICD-10-CM | POA: Diagnosis not present

## 2022-12-28 DIAGNOSIS — Z8639 Personal history of other endocrine, nutritional and metabolic disease: Secondary | ICD-10-CM | POA: Diagnosis not present

## 2023-01-14 ENCOUNTER — Encounter: Payer: Self-pay | Admitting: Nurse Practitioner

## 2023-01-14 ENCOUNTER — Ambulatory Visit: Payer: Medicare (Managed Care) | Admitting: Nurse Practitioner

## 2023-01-14 VITALS — BP 114/77 | HR 61 | Ht 63.0 in | Wt 119.4 lb

## 2023-01-14 DIAGNOSIS — Z0000 Encounter for general adult medical examination without abnormal findings: Secondary | ICD-10-CM | POA: Diagnosis not present

## 2023-01-14 DIAGNOSIS — Z13820 Encounter for screening for osteoporosis: Secondary | ICD-10-CM

## 2023-01-14 DIAGNOSIS — E2839 Other primary ovarian failure: Secondary | ICD-10-CM | POA: Diagnosis not present

## 2023-01-14 DIAGNOSIS — R9431 Abnormal electrocardiogram [ECG] [EKG]: Secondary | ICD-10-CM

## 2023-01-14 DIAGNOSIS — Z8249 Family history of ischemic heart disease and other diseases of the circulatory system: Secondary | ICD-10-CM | POA: Diagnosis not present

## 2023-01-14 DIAGNOSIS — F411 Generalized anxiety disorder: Secondary | ICD-10-CM | POA: Diagnosis not present

## 2023-01-14 MED ORDER — CITALOPRAM HYDROBROMIDE 20 MG PO TABS: 20.0000 mg | ORAL_TABLET | Freq: Every day | ORAL | 3 refills | Status: DC

## 2023-01-14 NOTE — Progress Notes (Signed)
Subjective:    Rebecca Robertson is a 67 y.o. female who presents for a Welcome to Medicare exam.   Review of Systems -She denies chest pain, chest pressure, or shortness of breath. She denies headaches or visual disturbances. She denies abdominal pain, nausea, vomiting, or changes in bowel or bladder habits.          Objective:    Today's Vitals   01/14/23 0828  BP: 114/77  Pulse: 61  SpO2: 100%  Weight: 119 lb 6.4 oz (54.2 kg)  Height: 5\' 3"  (1.6 m)  Body mass index is 21.15 kg/m.  Medications Outpatient Encounter Medications as of 01/14/2023  Medication Sig   acetaminophen (TYLENOL) 500 MG tablet Take 2 tablets (1,000 mg total) by mouth every 6 (six) hours as needed.   albuterol (VENTOLIN HFA) 108 (90 Base) MCG/ACT inhaler Inhale 2 puffs into the lungs every 6 (six) hours as needed for wheezing or shortness of breath.   azithromycin (ZITHROMAX) 250 MG tablet z-pack - take as directed for 5 days   BAYER CONTOUR NEXT TEST test strip Check blood sugar daily   cefUROXime (CEFTIN) 500 MG tablet Take 1 tablet (500 mg total) by mouth 2 (two) times daily with a meal.   CREON 36000-114000 units CPEP capsule Take by mouth.   dicyclomine (BENTYL) 20 MG tablet Take 20 mg by mouth 3 (three) times daily as needed.   fluconazole (DIFLUCAN) 150 MG tablet Take 1 tablet po once. May repeat dose in 3 days as needed for persistent symptoms.   Fluocinolone Acetonide Body 0.01 % OIL Apply 1 application topically 2 (two) times daily as needed.   hydrOXYzine (ATARAX) 10 MG tablet Take 1 tablet (10 mg total) by mouth at bedtime as needed.   lovastatin (MEVACOR) 40 MG tablet Take 1 tablet po QD with supper.   mupirocin cream (BACTROBAN) 2 % Apply 1 application topically 2 (two) times daily.   nystatin ointment (MYCOSTATIN) Apply 1 application topically 2 (two) times daily.   ondansetron (ZOFRAN-ODT) 4 MG disintegrating tablet Take 1 tablet (4 mg total) by mouth every 8 (eight) hours as needed for  nausea or vomiting.   predniSONE (STERAPRED UNI-PAK 21 TAB) 10 MG (21) TBPK tablet 6 day taper - take by mouth as directed for 6 days   Semaglutide,0.25 or 0.5MG /DOS, 2 MG/1.5ML SOPN Inject 0.25 mg into the skin every Sunday.   SYNJARDY 12.12-998 MG TABS Take 1 tablet 2 (two) times daily by mouth.    triamcinolone (KENALOG) 0.025 % cream Apply 1 Application topically 2 (two) times daily.   vitamin B-12 (CYANOCOBALAMIN) 1000 MCG tablet Take 1,000 mcg by mouth every other day.   XIFAXAN 550 MG TABS tablet Take 550 mg by mouth 3 (three) times daily.   [DISCONTINUED] citalopram (CELEXA) 20 MG tablet Take 1 tablet (20 mg total) by mouth daily.   [DISCONTINUED] oxybutynin (DITROPAN) 5 MG tablet Take 1 tablet (5 mg total) by mouth 2 (two) times daily.   citalopram (CELEXA) 20 MG tablet Take 1 tablet (20 mg total) by mouth daily.   TRULANCE 3 MG TABS Take 1 tablet by mouth daily. (Patient not taking: Reported on 10/20/2022)   No facility-administered encounter medications on file as of 01/14/2023.     History: Past Medical History:  Diagnosis Date   Anxiety    Diabetes mellitus    FHx: pancreatic disease    History of breast cancer ONCOLOGIST-- DR Welton Flakes--  NO RECURRENCE   S/P LEFT PARTIAL MASTECTOMY W/  SLN BX'S  12-02-2010---  DUCTAL CARCINOMA IN SITU--  S/P RADIATION THERAPY ENDED 02-17-2011   Hyperlipidemia    Personal history of radiation therapy 2012   Pneumonia    PONV (postoperative nausea and vomiting)    SUI (stress urinary incontinence, female)    Vitamin D deficiency    Vitamin D insufficiency 03/04/2016   Past Surgical History:  Procedure Laterality Date   BLADDER SUSPENSION N/A 02/03/2013   Procedure: SPARC PROCEDURE;  Surgeon: Martina Sinner, MD;  Location: [facility];  Service: Urology;  Laterality: N/A;   BREAST LUMPECTOMY Left 2012   BUNIONECTOMY     CESAREAN SECTION     X4   LEFT HEART CATH AND CORONARY ANGIOGRAPHY N/A 01/26/2018   Procedure: LEFT HEART  CATH AND CORONARY ANGIOGRAPHY;  Surgeon: Iran Ouch, MD;  Location: [facility];  Service: Cardiovascular;  Laterality: N/A;   MENISCUS REPAIR Left    PARTIAL MASTECTOMY WITH AXILLARY SENTINEL LYMPH NODE BIOPSY Left 12-02-2010    Family History  Problem Relation Age of Onset   Cancer Mother        lung   Heart attack Father    COPD Father    Heart disease Father    Social History   Occupational History   Not on file  Tobacco Use   Smoking status: Never   Smokeless tobacco: Never  Vaping Use   Vaping Use: Never used  Substance and Sexual Activity   Alcohol use: No   Drug use: No   Sexual activity: Not on file    Tobacco Counseling Counseling given: Not Answered   Immunizations and Health Maintenance Immunization History  Administered Date(s) Administered   Influenza,inj,Quad PF,6+ Mos 06/09/2018, 08/14/2021   Influenza-Unspecified 05/07/2016, 04/17/2017   PFIZER(Purple Top)SARS-COV-2 Vaccination 12/25/2019, 08/05/2020   PPD Test 05/04/2016   Pneumococcal Polysaccharide-23 07/01/2009   Tdap 08/18/2007, 12/09/2019   Health Maintenance Due  Topic Date Due   HIV Screening  Never done   Hepatitis C Screening  Never done   Zoster Vaccines- Shingrix (1 of 2) Never done   COVID-19 Vaccine (3 - Pfizer risk series) 09/02/2020   Pneumonia Vaccine 43+ Years old (2 of 2 - PCV) 06/24/2022   DEXA SCAN  Never done   Diabetic kidney evaluation - eGFR measurement  02/10/2023    Activities of Daily Living   Row Labels 08/13/2022    4:16 PM 02/09/2022    8:41 AM  In your present state of health, do you have any difficulty performing the following activities:   Section Header. No data exists in this row.    Hearing?   0 0  Vision?   0 0  Difficulty concentrating or making decisions?   0 0  Walking or climbing stairs?   0 0  Dressing or bathing?   0 0  Doing errands, shopping?   0 0    Physical Exam  (optional), or other factors deemed appropriate based on the  beneficiary's medical and social history and current clinical standards.  Physical Exam Vitals and nursing note reviewed.  Constitutional:      Appearance: Normal appearance. She is well-developed.  HENT:     Head: Normocephalic and atraumatic.     Right Ear: Tympanic membrane, ear canal and external ear normal.     Left Ear: Tympanic membrane, ear canal and external ear normal.     Nose: Nose normal.     Mouth/Throat:     Mouth: Mucous membranes  are moist.     Pharynx: Oropharynx is clear.  Eyes:     Extraocular Movements: Extraocular movements intact.     Conjunctiva/sclera: Conjunctivae normal.     Pupils: Pupils are equal, round, and reactive to light.  Neck:     Vascular: No carotid bruit.  Cardiovascular:     Rate and Rhythm: Normal rate and regular rhythm.     Pulses: Normal pulses.     Heart sounds: Normal heart sounds.     Comments: EKG is abnormal today, showing sinus bradycardia with possible anterior infarct and possible inferior infarct.  Pulmonary:     Effort: Pulmonary effort is normal.     Breath sounds: Normal breath sounds.  Abdominal:     General: Bowel sounds are normal. There is no distension.     Palpations: Abdomen is soft. There is no mass.     Tenderness: There is no abdominal tenderness. There is no right CVA tenderness, left CVA tenderness, guarding or rebound.     Hernia: No hernia is present.  Musculoskeletal:        General: Normal range of motion.     Cervical back: Normal range of motion and neck supple.  Lymphadenopathy:     Cervical: No cervical adenopathy.  Skin:    General: Skin is warm and dry.     Capillary Refill: Capillary refill takes less than 2 seconds.  Neurological:     General: No focal deficit present.     Mental Status: She is alert and oriented to person, place, and time.  Psychiatric:        Mood and Affect: Mood normal.        Behavior: Behavior normal.        Thought Content: Thought content normal.        Judgment:  Judgment normal.        Assessment:  Encounter for Medicare annual wellness exam -     EKG 12-Lead  Abnormal ECG Assessment & Plan: EKG is abnormal today, showing sinus bradycardia with possible anterior infarct and possible inferior infarct.  -family history  of CAD. -Refer to cardiology for further evaluation.  Orders: -     Ambulatory referral to Cardiology  Family history of coronary artery disease Assessment & Plan: EKG is abnormal today, showing sinus bradycardia with possible anterior infarct and possible inferior infarct.  -family history  of CAD. -Refer to cardiology for further evaluation.  Orders: -     Ambulatory referral to Cardiology  Generalized anxiety disorder Assessment & Plan: Stable. -Continue current medication.   Orders: -     Citalopram Hydrobromide; Take 1 tablet (20 mg total) by mouth daily.  Dispense: 90 tablet; Refill: 3  Estrogen deficiency Assessment & Plan: Baseline bone density test ordered today.  Orders: -     DG Bone Density; Future  Screening for osteoporosis -     DG Bone Density; Future     Vision/Hearing screen No results found.   Depression Screen   Row Labels 01/14/2023    8:30 AM 09/07/2022   10:44 AM 08/13/2022    4:15 PM 07/15/2022    1:31 PM  PHQ 2/9 Scores   Section Header. No data exists in this row.      PHQ - 2 Score   0 0 0 0  PHQ- 9 Score   1 0 0 0     Fall Risk   Row Labels 09/07/2022   10:45 AM  Fall Risk  Section Header. No data exists in this row.   Falls in the past year?   0  Number falls in past yr:   0  Injury with Fall?   0  Follow up   Falls evaluation completed      Patient Care Team: Carlean Jews, NP as PCP - General (Family Medicine) Dorena Cookey, MD (Inactive) as Consulting Physician (Gastroenterology) Talmage Coin, MD as Consulting Physician (Endocrinology) Cherlyn Roberts, MD as Consulting Physician (Dermatology) Serena Croissant, MD as Consulting Physician (Hematology  and Oncology) Alfredo Martinez, MD as Consulting Physician (Urology) Tobias Alexander, OD as Referring Physician (Optometry)     Plan:    I have personally reviewed and noted the following in the patient's chart:   Medical and social history Use of alcohol, tobacco or illicit drugs  Current medications and supplements Functional ability and status Nutritional status Physical activity Advanced directives List of other physicians Hospitalizations, surgeries, and ER visits in previous 12 months Vitals Screenings to include cognitive, depression, and falls Referrals and appointments  In addition, I have reviewed and discussed with patient certain preventive protocols, quality metrics, and best practice recommendations. A written personalized care plan for preventive services as well as general preventive health recommendations were provided to patient.     Carlean Jews, NP 01/31/2023  Return for as scheduled, FBW at time of visit.

## 2023-01-21 ENCOUNTER — Telehealth: Payer: Self-pay | Admitting: *Deleted

## 2023-01-21 ENCOUNTER — Other Ambulatory Visit: Payer: Self-pay

## 2023-01-21 DIAGNOSIS — R350 Frequency of micturition: Secondary | ICD-10-CM

## 2023-01-21 MED ORDER — OXYBUTYNIN CHLORIDE 5 MG PO TABS: 5.0000 mg | ORAL_TABLET | Freq: Two times a day (BID) | ORAL | 1 refills | Status: DC

## 2023-01-21 NOTE — Telephone Encounter (Signed)
Rx has been sent  

## 2023-01-21 NOTE — Telephone Encounter (Signed)
PT left message requesting a refill on below.     oxybutynin (DITROPAN) 5 MG tablet    [id number] - [place] ([place]), [place] - [address]

## 2023-01-28 ENCOUNTER — Other Ambulatory Visit: Payer: Self-pay | Admitting: Nurse Practitioner

## 2023-01-28 ENCOUNTER — Telehealth: Payer: Self-pay

## 2023-01-28 DIAGNOSIS — N644 Mastodynia: Secondary | ICD-10-CM

## 2023-01-28 DIAGNOSIS — Z6379 Other stressful life events affecting family and household: Secondary | ICD-10-CM | POA: Diagnosis not present

## 2023-01-28 DIAGNOSIS — F4323 Adjustment disorder with mixed anxiety and depressed mood: Secondary | ICD-10-CM | POA: Diagnosis not present

## 2023-01-28 DIAGNOSIS — Z853 Personal history of malignant neoplasm of breast: Secondary | ICD-10-CM

## 2023-01-28 NOTE — Telephone Encounter (Signed)
Pt is in need of Dx breast mammogram. Pt states she feeling like needles going through both breast. Pt had breast cancer in the past and is concern. She was scheduled for mammo next week but they want her to get order then bring her in sooner. She has noticed red dots on stomach as well not sure if they are related.   Please advise

## 2023-01-28 NOTE — Telephone Encounter (Signed)
I really am not sure about the red dots on her stomach. She may need to be seen for further evaluation.

## 2023-01-29 ENCOUNTER — Other Ambulatory Visit: Payer: Self-pay | Admitting: Nurse Practitioner

## 2023-01-29 DIAGNOSIS — Z853 Personal history of malignant neoplasm of breast: Secondary | ICD-10-CM

## 2023-01-29 DIAGNOSIS — N644 Mastodynia: Secondary | ICD-10-CM

## 2023-01-31 DIAGNOSIS — E2839 Other primary ovarian failure: Secondary | ICD-10-CM | POA: Insufficient documentation

## 2023-01-31 DIAGNOSIS — R9431 Abnormal electrocardiogram [ECG] [EKG]: Secondary | ICD-10-CM | POA: Insufficient documentation

## 2023-01-31 NOTE — Assessment & Plan Note (Signed)
EKG is abnormal today, showing sinus bradycardia with possible anterior infarct and possible inferior infarct.  -family history  of CAD. -Refer to cardiology for further evaluation.

## 2023-01-31 NOTE — Assessment & Plan Note (Signed)
Baseline bone density test ordered today.

## 2023-01-31 NOTE — Assessment & Plan Note (Signed)
EKG is abnormal today, showing sinus bradycardia with possible anterior infarct and possible inferior infarct.  -family history  of CAD. -Refer to cardiology for further evaluation. 

## 2023-01-31 NOTE — Assessment & Plan Note (Signed)
Stable.  Continue current medication

## 2023-02-01 ENCOUNTER — Ambulatory Visit: Payer: Medicare (Managed Care)

## 2023-02-01 ENCOUNTER — Ambulatory Visit
Admission: RE | Admit: 2023-02-01 | Discharge: 2023-02-01 | Disposition: A | Discharge status: Home / Self Care | Payer: Medicare (Managed Care) | Source: Ambulatory Visit | Attending: Nurse Practitioner | Admitting: Nurse Practitioner

## 2023-02-01 DIAGNOSIS — N644 Mastodynia: Secondary | ICD-10-CM | POA: Diagnosis not present

## 2023-02-01 NOTE — Progress Notes (Signed)
Benign mammogram. Repeat in one year

## 2023-02-04 ENCOUNTER — Ambulatory Visit: Payer: Medicare (Managed Care)

## 2023-02-10 ENCOUNTER — Encounter: Payer: Self-pay | Admitting: Internal Medicine

## 2023-02-10 ENCOUNTER — Ambulatory Visit: Payer: Medicare (Managed Care) | Attending: Internal Medicine | Admitting: Internal Medicine

## 2023-02-10 VITALS — BP 98/56 | HR 78 | Ht 63.0 in | Wt 118.2 lb

## 2023-02-10 DIAGNOSIS — Z79899 Other long term (current) drug therapy: Secondary | ICD-10-CM

## 2023-02-10 DIAGNOSIS — Z8249 Family history of ischemic heart disease and other diseases of the circulatory system: Secondary | ICD-10-CM

## 2023-02-10 DIAGNOSIS — Z6379 Other stressful life events affecting family and household: Secondary | ICD-10-CM | POA: Diagnosis not present

## 2023-02-10 DIAGNOSIS — E785 Hyperlipidemia, unspecified: Secondary | ICD-10-CM

## 2023-02-10 DIAGNOSIS — F4323 Adjustment disorder with mixed anxiety and depressed mood: Secondary | ICD-10-CM | POA: Diagnosis not present

## 2023-02-10 MED ORDER — ROSUVASTATIN CALCIUM 20 MG PO TABS: 20.0000 mg | ORAL_TABLET | Freq: Every day | ORAL | 3 refills | Status: DC

## 2023-02-10 NOTE — Patient Instructions (Addendum)
Medication Instructions:  Your physician has recommended you make the following change in your medication:  1) STOP lovastatin 2) START rosuvastatin (Crestor) 20mg  daily  *If you need a refill on your cardiac medications before your next appointment, please call your pharmacy*  Lab Work: In September 2024: NMR, ApoB, Lipoprotein A, Liver panel If you have labs (blood work) drawn today and your tests are completely normal, you will receive your results only by: MyChart Message (if you have MyChart) OR A paper copy in the mail If you have any lab test that is abnormal or we need to change your treatment, we will call you to review the results.  Testing/Procedures: None ordered today.  Follow-Up: At [facility], you and your health needs are our priority.  As part of our continuing mission to provide you with exceptional heart care, we have created designated Provider Care Teams.  These Care Teams include your primary Cardiologist (physician) and Advanced Practice Providers (APPs -  Physician Assistants and Nurse Practitioners) who all work together to provide you with the care you need, when you need it.  Your next appointment:   As needed  The format for your next appointment:   In Person  Provider:   Dietrich Pates, MD {

## 2023-02-10 NOTE — Progress Notes (Signed)
Cardiology Office Note   Date:  02/10/2023   ID:  Rebecca Robertson, Rebecca Robertson 1955/12/27, MRN [id number]  PCP:  Carlean Jews, NP  Cardiologist:   Dietrich Pates, MD       History of Present Illness: Rebecca Robertson is a 67 y.o. female with a history of DM, HL breat Ca (s/p L mastectomy with XRT)  Pt seen by Terrilee Files in the past for CP   Underwent evaluation (GXT, Myoview then cath)  Cath showed no significant CAD   The pt is followed in IM  Seen by H Boscia   EKG done   Shows Q wave in III, Possible AVF   Also in V1    Referred to cardiology     The pt deneis CP  Breathing is OK  No dizziness Not very active   Does not like to go outside   Does have a stationary bike at home   Doesn't use       Current Meds  Medication Sig   acetaminophen (TYLENOL) 500 MG tablet Take 2 tablets (1,000 mg total) by mouth every 6 (six) hours as needed.   BAYER CONTOUR NEXT TEST test strip Check blood sugar daily   citalopram (CELEXA) 20 MG tablet Take 1 tablet (20 mg total) by mouth daily.   CREON 36000-114000 units CPEP capsule Take by mouth 3 (three) times daily before meals.   dicyclomine (BENTYL) 20 MG tablet Take 20 mg by mouth 3 (three) times daily as needed.   Fluocinolone Acetonide Body 0.01 % OIL Apply 1 application topically 2 (two) times daily as needed.   lovastatin (MEVACOR) 40 MG tablet Take 1 tablet po QD with supper.   mupirocin cream (BACTROBAN) 2 % Apply 1 application topically 2 (two) times daily.   nystatin ointment (MYCOSTATIN) Apply 1 application topically 2 (two) times daily.   oxybutynin (DITROPAN) 5 MG tablet Take 1 tablet (5 mg total) by mouth 2 (two) times daily.   Semaglutide,0.25 or 0.5MG /DOS, 2 MG/1.5ML SOPN Inject 0.25 mg into the skin every Sunday.   SYNJARDY 12.12-998 MG TABS Take 1 tablet 2 (two) times daily by mouth.    triamcinolone (KENALOG) 0.025 % cream Apply 1 Application topically 2 (two) times daily.   TRULANCE 3 MG TABS Take 1 tablet by mouth daily.    vitamin B-12 (CYANOCOBALAMIN) 1000 MCG tablet Take 1,000 mcg by mouth every other day.     Allergies:   Compazine, Dulaglutide, Sitagliptin-metformin hcl, Tamoxifen citrate, Aspirin, Atorvastatin, Bydureon [exenatide], and Liraglutide   Past Medical History:  Diagnosis Date   Anxiety    Diabetes mellitus    FHx: pancreatic disease    History of breast cancer ONCOLOGIST-- DR Welton Flakes--  NO RECURRENCE   S/P LEFT PARTIAL MASTECTOMY W/ SLN BX'S  12-02-2010---  DUCTAL CARCINOMA IN SITU--  S/P RADIATION THERAPY ENDED 02-17-2011   Hyperlipidemia    Personal history of radiation therapy 2012   Pneumonia    PONV (postoperative nausea and vomiting)    SUI (stress urinary incontinence, female)    Vitamin D deficiency    Vitamin D insufficiency 03/04/2016    Past Surgical History:  Procedure Laterality Date   BLADDER SUSPENSION N/A 02/03/2013   Procedure: SPARC PROCEDURE;  Surgeon: Martina Sinner, MD;  Location: [facility];  Service: Urology;  Laterality: N/A;   BREAST LUMPECTOMY Left 2012   BUNIONECTOMY     CESAREAN SECTION     X4   LEFT HEART CATH AND  CORONARY ANGIOGRAPHY N/A 01/26/2018   Procedure: LEFT HEART CATH AND CORONARY ANGIOGRAPHY;  Surgeon: Iran Ouch, MD;  Location: [facility];  Service: Cardiovascular;  Laterality: N/A;   MENISCUS REPAIR Left    PARTIAL MASTECTOMY WITH AXILLARY SENTINEL LYMPH NODE BIOPSY Left 12-02-2010     Social History:  The patient  reports that she has never smoked. She has never used smokeless tobacco. She reports that she does not drink alcohol and does not use drugs.   Family History:  The patient's family history includes COPD in her father; Cancer in her mother; Heart attack in her father; Heart disease in her father.    ROS:  Please see the history of present illness. All other systems are reviewed and  Negative to the above problem except as noted.    PHYSICAL EXAM: VS:  BP (!) 98/56   Pulse 78   Ht 5\' 3"   (1.6 m)   Wt 118 lb 3.2 oz (53.6 kg)   SpO2 98%   BMI 20.94 kg/m   GEN: Thin 67 yo  in no acute distress  HEENT: normal  Neck: no JVD, carotid bruits Cardiac: RRR; no murmur  No LE edema  Respiratory:  clear to auscultation bilaterally GI: soft, nontender, nondistended, + BS  No hepatomegaly   EKG:  EKG is not ordered today.   Lipid Panel    Component Value Date/Time   CHOL 170 02/09/2022 0920   TRIG 228 (H) 02/09/2022 0920   HDL 41 02/09/2022 0920   CHOLHDL 4.1 02/09/2022 0920   CHOLHDL 2.2 05/28/2016 1140   VLDL 23 05/28/2016 1140   LDLCALC 91 02/09/2022 0920      Wt Readings from Last 3 Encounters:  02/10/23 118 lb 3.2 oz (53.6 kg)  01/14/23 119 lb 6.4 oz (54.2 kg)  10/20/22 115 lb (52.2 kg)      ASSESSMENT AND PLAN:  1  Abnormal EKG  Pt with inferior in III  QR in AVF  Q in V1   She has had this pattern on previous EKG    I think it is the way heart sitting in chest     cath in 2019 with irregularities   Clinically no events     Follow   Risk factor modify   Increase activity   2  HL    Will switcht to Crestor   Stop mevacor   Get lipomed, Lpa and Apo B in 8 wks with liver panel  3  Diabetes   Needs good dietary referral  for both pancreatic insuff and DM   Whole natural food  Lots of veggies if able     4  Metabolics  INcreae activity   Take 2000 Vit D   Current medicines are reviewed at length with the patient today.  The patient does not have concerns regarding medicines.  Signed, Dietrich Pates, MD  02/10/2023 9:43 AM    [facility] [address] [place], [place], [place]  [address] Phone: [phone]; Fax: [phone]

## 2023-02-11 ENCOUNTER — Other Ambulatory Visit (HOSPITAL_COMMUNITY)
Admission: RE | Admit: 2023-02-11 | Discharge: 2023-02-11 | Disposition: A | Discharge status: Home / Self Care | Payer: Medicare (Managed Care) | Source: Ambulatory Visit | Attending: Nurse Practitioner | Admitting: Nurse Practitioner

## 2023-02-11 ENCOUNTER — Ambulatory Visit: Payer: Medicare (Managed Care) | Admitting: Nurse Practitioner

## 2023-02-11 ENCOUNTER — Encounter: Payer: Self-pay | Admitting: Nurse Practitioner

## 2023-02-11 VITALS — BP 117/70 | HR 66 | Ht 63.0 in | Wt 118.0 lb

## 2023-02-11 DIAGNOSIS — Z01419 Encounter for gynecological examination (general) (routine) without abnormal findings: Secondary | ICD-10-CM

## 2023-02-11 DIAGNOSIS — Z1151 Encounter for screening for human papillomavirus (HPV): Secondary | ICD-10-CM | POA: Insufficient documentation

## 2023-02-11 DIAGNOSIS — E1169 Type 2 diabetes mellitus with other specified complication: Secondary | ICD-10-CM

## 2023-02-11 DIAGNOSIS — E785 Hyperlipidemia, unspecified: Secondary | ICD-10-CM

## 2023-02-11 DIAGNOSIS — K8681 Exocrine pancreatic insufficiency: Secondary | ICD-10-CM | POA: Diagnosis not present

## 2023-02-11 DIAGNOSIS — E0800 Diabetes mellitus due to underlying condition with hyperosmolarity without nonketotic hyperglycemic-hyperosmolar coma (NKHHC): Secondary | ICD-10-CM

## 2023-02-11 NOTE — Progress Notes (Signed)
Established patient visit   Patient: Rebecca Robertson   DOB: [birth year]   67 y.o. Female  MRN: [id number] Visit Date: 02/11/2023   Chief Complaint  Patient presents with   Gynecologic Exam   Subjective    HPI  Presents for Pap smear only. -With cardiology due to abnormal EKG and family history of CAD. She has no new concerns or complaints today. -She denies chest pain, chest pressure, or shortness of breath. She denies headaches or visual disturbances. She denies abdominal pain, nausea, vomiting, or changes in bowel or bladder habits.     Medications: Outpatient Medications Prior to Visit  Medication Sig   acetaminophen (TYLENOL) 500 MG tablet Take 2 tablets (1,000 mg total) by mouth every 6 (six) hours as needed.   BAYER CONTOUR NEXT TEST test strip Check blood sugar daily   citalopram (CELEXA) 20 MG tablet Take 1 tablet (20 mg total) by mouth daily.   CREON 36000-114000 units CPEP capsule Take by mouth 3 (three) times daily before meals.   dicyclomine (BENTYL) 20 MG tablet Take 20 mg by mouth 3 (three) times daily as needed.   Fluocinolone Acetonide Body 0.01 % OIL Apply 1 application topically 2 (two) times daily as needed.   mupirocin cream (BACTROBAN) 2 % Apply 1 application topically 2 (two) times daily.   nystatin ointment (MYCOSTATIN) Apply 1 application topically 2 (two) times daily.   oxybutynin (DITROPAN) 5 MG tablet Take 1 tablet (5 mg total) by mouth 2 (two) times daily.   rosuvastatin (CRESTOR) 20 MG tablet Take 1 tablet (20 mg total) by mouth daily.   Semaglutide,0.25 or 0.5MG /DOS, 2 MG/1.5ML SOPN Inject 0.25 mg into the skin every Sunday.   SYNJARDY 12.12-998 MG TABS Take 1 tablet 2 (two) times daily by mouth.    triamcinolone (KENALOG) 0.025 % cream Apply 1 Application topically 2 (two) times daily.   TRULANCE 3 MG TABS Take 1 tablet by mouth daily.   vitamin B-12 (CYANOCOBALAMIN) 1000 MCG tablet Take 1,000 mcg by mouth every other day.   No  facility-administered medications prior to visit.    Review of Systems See HPI    Last CBC Lab Results  Component Value Date   WBC 7.5 10/21/2022   HGB 14.0 10/21/2022   HCT 42.7 10/21/2022   MCV 89 10/21/2022   MCH 29.3 10/21/2022   RDW 12.4 10/21/2022   PLT 268 10/21/2022   Last metabolic panel Lab Results  Component Value Date   GLUCOSE 159 (H) 02/09/2022   NA 141 02/09/2022   K 4.5 02/09/2022   CL 106 02/09/2022   CO2 22 02/09/2022   BUN 11 02/09/2022   CREATININE 0.55 (L) 02/09/2022   EGFR 102 02/09/2022   CALCIUM 9.3 02/09/2022   PROT 7.0 02/09/2022   ALBUMIN 4.2 02/09/2022   LABGLOB 2.8 02/09/2022   AGRATIO 1.5 02/09/2022   BILITOT <0.2 02/09/2022   ALKPHOS 106 02/09/2022   AST 19 02/09/2022   ALT 17 02/09/2022   ANIONGAP 12 11/14/2021   Last lipids Lab Results  Component Value Date   CHOL 170 02/09/2022   HDL 41 02/09/2022   LDLCALC 91 02/09/2022   TRIG 228 (H) 02/09/2022   CHOLHDL 4.1 02/09/2022   Last hemoglobin A1c Lab Results  Component Value Date   HGBA1C 7.6 (A) 02/09/2022   Last thyroid functions Lab Results  Component Value Date   TSH 1.240 02/09/2022   Last vitamin D Lab Results  Component Value Date   VD25OH 37.3 02/09/2022  Objective     Today's Vitals   02/11/23 1314  BP: 117/70  Pulse: 66  SpO2: 98%  Weight: 118 lb (53.5 kg)  Height: 5\' 3"  (1.6 m)   Body mass index is 20.9 kg/m.  BP Readings from Last 3 Encounters:  02/11/23 117/70  02/10/23 (Abnormal) 98/56  01/14/23 114/77    Wt Readings from Last 3 Encounters:  02/11/23 118 lb (53.5 kg)  02/10/23 118 lb 3.2 oz (53.6 kg)  01/14/23 119 lb 6.4 oz (54.2 kg)    Physical Exam Vitals and nursing note reviewed. Exam conducted with a chaperone present.  Constitutional:      Appearance: Normal appearance. She is well-developed.  HENT:     Head: Normocephalic and atraumatic.     Nose: Nose normal.     Mouth/Throat:     Mouth: Mucous membranes are moist.      Pharynx: Oropharynx is clear.  Eyes:     Extraocular Movements: Extraocular movements intact.     Conjunctiva/sclera: Conjunctivae normal.     Pupils: Pupils are equal, round, and reactive to light.  Neck:     Vascular: No carotid bruit.  Cardiovascular:     Rate and Rhythm: Normal rate and regular rhythm.     Pulses: Normal pulses.     Heart sounds: Normal heart sounds.  Pulmonary:     Effort: Pulmonary effort is normal.     Breath sounds: Normal breath sounds.  Abdominal:     Palpations: Abdomen is soft.     Hernia: There is no hernia in the left inguinal area or right inguinal area.  Genitourinary:    General: Normal vulva.     Exam position: Supine.     Labia:        Right: No rash, tenderness, lesion or injury.        Left: No rash, tenderness, lesion or injury.      Vagina: Normal. No signs of injury and foreign body. No vaginal discharge, erythema, tenderness, bleeding, lesions or prolapsed vaginal walls.     Cervix: No cervical motion tenderness, discharge, friability, lesion, erythema, cervical bleeding or eversion.     Uterus: Not deviated, not enlarged, not fixed, not tender and no uterine prolapse.      Adnexa: Right adnexa normal and left adnexa normal.     Comments: No tenderness, masses, or organomeglay present during bimanual exam .   Musculoskeletal:        General: Normal range of motion.     Cervical back: Normal range of motion and neck supple.  Lymphadenopathy:     Cervical: No cervical adenopathy.     Lower Body: No right inguinal adenopathy. No left inguinal adenopathy.  Skin:    General: Skin is warm and dry.     Capillary Refill: Capillary refill takes less than 2 seconds.  Neurological:     General: No focal deficit present.     Mental Status: She is alert and oriented to person, place, and time.  Psychiatric:        Mood and Affect: Mood normal.        Behavior: Behavior normal.        Thought Content: Thought content normal.         Judgment: Judgment normal.      Results for orders placed or performed in visit on 02/11/23  Cytology - PAP( [facility])  Result Value Ref Range   High risk HPV Negative    Adequacy  Satisfactory for evaluation; transformation zone component ABSENT.   Diagnosis      - Negative for intraepithelial lesion or malignancy (NILM)   Comment Inflammation and atrophic changes are present.    Comment Normal Reference Range HPV - Negative     Assessment & Plan    Well woman exam -     Cytology - PAP  Hyperlipidemia associated with type 2 diabetes mellitus (HCC) Assessment & Plan: Most recent lipid panel -  Lipid Panel     Component Value Date/Time   CHOL 170 02/09/2022 0920   TRIG 228 (H) 02/09/2022 0920   HDL 41 02/09/2022 0920   CHOLHDL 4.1 02/09/2022 0920   CHOLHDL 2.2 05/28/2016 1140   VLDL 23 05/28/2016 1140   LDLCALC 91 02/09/2022 0920   LABVLDL 38 02/09/2022 0920  Follow-up with cardiology as scheduled.   Exocrine pancreatic insufficiency Assessment & Plan: Currently stable. Continue regular visits with endocrinology and GI as scheduled.   Diabetes mellitus due to underlying condition with hyperosmolarity without coma, without long-term current use of insulin [facility]) Assessment & Plan: Patient currently seeing endocrinology for management.      Return in about 6 months (around 08/13/2023) for blood pressure.         Carlean Jews, NP  [facility] [phone] (phone) [phone] (fax)  [facility]

## 2023-02-15 ENCOUNTER — Encounter: Payer: Self-pay | Admitting: Nurse Practitioner

## 2023-02-15 ENCOUNTER — Ambulatory Visit: Payer: Medicare (Managed Care) | Admitting: Family Medicine

## 2023-02-15 ENCOUNTER — Ambulatory Visit: Payer: Medicare (Managed Care) | Admitting: Nurse Practitioner

## 2023-02-15 LAB — CYTOLOGY - PAP
Adequacy: ABSENT
Comment: NEGATIVE
Diagnosis: NEGATIVE
High risk HPV: NEGATIVE

## 2023-02-15 NOTE — Assessment & Plan Note (Signed)
Most recent lipid panel -  Lipid Panel     Component Value Date/Time   CHOL 170 02/09/2022 0920   TRIG 228 (H) 02/09/2022 0920   HDL 41 02/09/2022 0920   CHOLHDL 4.1 02/09/2022 0920   CHOLHDL 2.2 05/28/2016 1140   VLDL 23 05/28/2016 1140   LDLCALC 91 02/09/2022 0920   LABVLDL 38 02/09/2022 0920  Follow-up with cardiology as scheduled.

## 2023-02-15 NOTE — Assessment & Plan Note (Signed)
Patient currently seeing endocrinology for management.

## 2023-02-15 NOTE — Assessment & Plan Note (Signed)
Currently stable. Continue regular visits with endocrinology and GI as scheduled.

## 2023-02-15 NOTE — Assessment & Plan Note (Deleted)
-  Most recent lipid panel as follows -  Lipid Panel     Component Value Date/Time   CHOL 170 02/09/2022 0920   TRIG 228 (H) 02/09/2022 0920   HDL 41 02/09/2022 0920   CHOLHDL 4.1 02/09/2022 0920   CHOLHDL 2.2 05/28/2016 1140   VLDL 23 05/28/2016 1140   LDLCALC 91 02/09/2022 0920   LABVLDL 38 02/09/2022 0920  Follow-up with cardiology for further evaluation.

## 2023-02-19 ENCOUNTER — Other Ambulatory Visit: Payer: Medicare (Managed Care)

## 2023-02-22 DIAGNOSIS — N390 Urinary tract infection, site not specified: Secondary | ICD-10-CM | POA: Diagnosis not present

## 2023-02-22 DIAGNOSIS — J069 Acute upper respiratory infection, unspecified: Secondary | ICD-10-CM | POA: Diagnosis not present

## 2023-02-22 DIAGNOSIS — C50919 Malignant neoplasm of unspecified site of unspecified female breast: Secondary | ICD-10-CM | POA: Insufficient documentation

## 2023-02-22 DIAGNOSIS — H1032 Unspecified acute conjunctivitis, left eye: Secondary | ICD-10-CM | POA: Diagnosis not present

## 2023-02-22 DIAGNOSIS — E1165 Type 2 diabetes mellitus with hyperglycemia: Secondary | ICD-10-CM | POA: Diagnosis not present

## 2023-02-22 DIAGNOSIS — R809 Proteinuria, unspecified: Secondary | ICD-10-CM | POA: Diagnosis not present

## 2023-02-23 ENCOUNTER — Ambulatory Visit: Payer: Medicare (Managed Care) | Admitting: Internal Medicine

## 2023-02-25 DIAGNOSIS — N390 Urinary tract infection, site not specified: Secondary | ICD-10-CM | POA: Insufficient documentation

## 2023-02-25 HISTORY — DX: Urinary tract infection, site not specified: N39.0

## 2023-02-26 DIAGNOSIS — J069 Acute upper respiratory infection, unspecified: Secondary | ICD-10-CM | POA: Diagnosis not present

## 2023-02-26 DIAGNOSIS — H109 Unspecified conjunctivitis: Secondary | ICD-10-CM | POA: Diagnosis not present

## 2023-03-03 DIAGNOSIS — E1165 Type 2 diabetes mellitus with hyperglycemia: Secondary | ICD-10-CM | POA: Diagnosis not present

## 2023-03-03 DIAGNOSIS — Z6379 Other stressful life events affecting family and household: Secondary | ICD-10-CM | POA: Diagnosis not present

## 2023-03-03 DIAGNOSIS — F4323 Adjustment disorder with mixed anxiety and depressed mood: Secondary | ICD-10-CM | POA: Diagnosis not present

## 2023-03-03 DIAGNOSIS — C50012 Malignant neoplasm of nipple and areola, left female breast: Secondary | ICD-10-CM | POA: Diagnosis not present

## 2023-03-16 ENCOUNTER — Telehealth: Payer: Self-pay | Admitting: *Deleted

## 2023-03-16 NOTE — Telephone Encounter (Signed)
LVM for pt to call office, wanting to check and see if patient ever saw [facility] from previous referral.

## 2023-03-22 ENCOUNTER — Other Ambulatory Visit: Payer: Self-pay

## 2023-03-22 NOTE — Telephone Encounter (Signed)
Pt states that she didn't go to [facility] as the lump/mass went away. Pt is asking that the referral be closed.

## 2023-03-23 ENCOUNTER — Ambulatory Visit: Payer: Medicare (Managed Care) | Admitting: Physician Assistant

## 2023-03-23 ENCOUNTER — Encounter: Payer: Self-pay | Admitting: Physician Assistant

## 2023-03-23 VITALS — BP 112/63 | HR 72 | Temp 98.0°F | Ht 63.0 in | Wt 125.0 lb

## 2023-03-23 DIAGNOSIS — K589 Irritable bowel syndrome without diarrhea: Secondary | ICD-10-CM

## 2023-03-23 DIAGNOSIS — R1084 Generalized abdominal pain: Secondary | ICD-10-CM | POA: Diagnosis not present

## 2023-03-23 DIAGNOSIS — K8681 Exocrine pancreatic insufficiency: Secondary | ICD-10-CM | POA: Diagnosis not present

## 2023-03-23 DIAGNOSIS — Z79899 Other long term (current) drug therapy: Secondary | ICD-10-CM | POA: Diagnosis not present

## 2023-03-23 NOTE — Progress Notes (Signed)
Celso Amy, PA-C [address]  [address]  [place], [place] [address]  Main: [phone]  Fax: [phone]   Gastroenterology Consultation  Referring Provider:     Carlean Jews, NP Primary Care Physician:  Melida Quitter, PA Primary Gastroenterologist:  Celso Amy, PA-C / Dr. Wyline Mood   Reason for Consultation:     Pancreatic insufficiency and irritable bowel syndrome        HPI:   Rebecca Robertson is a 67 y.o. y/o female referred for consultation & management  by Melida Quitter, PA.    Patient presents as a new patient to our office transferring her GI care from [facility] gastro[facility]ology.  She has history of exocrine pancreatic insufficiency, currently taking Creon 36,000 lipase units 2 with each meal along with each snack.    Also history of severe irritable bowel syndrome with episodes of diarrhea, constipation and abdominal pain.  Currently taking dicyclomine 20 mg 3 times daily as needed for abdominal cramping.  Symptoms currently stable and improved.  He takes Colace stool softener daily.  Did not tolerate MiraLAX.  Has taken OTC magnesium and citrate of magnesia for constipation.  Had a flareup of IBS 1 month ago which is currently improved.  Insurance did not cover Trulance.  She did not tolerate Linzess.  Colonoscopy done by Dr. Madilyn Fireman at [facility] 04/2014 showed no polyps.   Past Medical History:  Diagnosis Date   Anxiety    Diabetes mellitus    FHx: pancreatic disease    History of breast cancer ONCOLOGIST-- DR Welton Flakes--  NO RECURRENCE   S/P LEFT PARTIAL MASTECTOMY W/ SLN BX'S  12-02-2010---  DUCTAL CARCINOMA IN SITU--  S/P RADIATION THERAPY ENDED 02-17-2011   Hyperlipidemia    Personal history of radiation therapy 2012   Pneumonia    PONV (postoperative nausea and vomiting)    SUI (stress urinary incontinence, female)    Vitamin D deficiency    Vitamin D insufficiency 03/04/2016    Past Surgical History:  Procedure Laterality Date   BLADDER  SUSPENSION N/A 02/03/2013   Procedure: SPARC PROCEDURE;  Surgeon: Martina Sinner, MD;  Location: [facility];  Service: Urology;  Laterality: N/A;   BREAST LUMPECTOMY Left 2012   BUNIONECTOMY     CESAREAN SECTION     X4   LEFT HEART CATH AND CORONARY ANGIOGRAPHY N/A 01/26/2018   Procedure: LEFT HEART CATH AND CORONARY ANGIOGRAPHY;  Surgeon: Iran Ouch, MD;  Location: [facility];  Service: Cardiovascular;  Laterality: N/A;   MENISCUS REPAIR Left    PARTIAL MASTECTOMY WITH AXILLARY SENTINEL LYMPH NODE BIOPSY Left 12-02-2010    Prior to Admission medications   Medication Sig Start Date End Date Taking? Authorizing Provider  acetaminophen (TYLENOL) 500 MG tablet Take 2 tablets (1,000 mg total) by mouth every 6 (six) hours as needed. 11/14/21  Yes Fondaw, Rodrigo Ran, PA  BAYER CONTOUR NEXT TEST test strip Check blood sugar daily 01/18/16  Yes [provider]  citalopram (CELEXA) 20 MG tablet Take 1 tablet (20 mg total) by mouth daily. 01/14/23  Yes Carlean Jews, NP  CREON 36000-114000 units CPEP capsule Take by mouth 3 (three) times daily before meals. 08/31/22  Yes [provider]  dicyclomine (BENTYL) 20 MG tablet Take 20 mg by mouth 3 (three) times daily as needed. 11/28/21  Yes [provider]  Fluocinolone Acetonide Body 0.01 % OIL Apply 1 application topically 2 (two) times daily as  needed. 02/02/18  Yes Danford, Orpha Bur D, NP  mupirocin cream (BACTROBAN) 2 % Apply 1 application topically 2 (two) times daily. 10/07/21  Yes Boscia, Kathlynn Grate, NP  nystatin ointment (MYCOSTATIN) Apply 1 application topically 2 (two) times daily. 08/14/21  Yes Boscia, Kathlynn Grate, NP  oxybutynin (DITROPAN) 5 MG tablet Take 1 tablet (5 mg total) by mouth 2 (two) times daily. 01/21/23  Yes Boscia, Kathlynn Grate, NP  rosuvastatin (CRESTOR) 20 MG tablet Take 1 tablet (20 mg total) by mouth daily. 02/10/23  Yes Pricilla Riffle, MD  Semaglutide,0.25 or 0.5MG /DOS, 2 MG/1.5ML SOPN  Inject 0.25 mg into the skin every Sunday.   Yes [provider]  triamcinolone (KENALOG) 0.025 % cream Apply 1 Application topically 2 (two) times daily. 09/07/22  Yes Boscia, Heather E, NP  TRULANCE 3 MG TABS Take 1 tablet by mouth daily. 04/30/21  Yes [provider]  vitamin B-12 (CYANOCOBALAMIN) 1000 MCG tablet Take 1,000 mcg by mouth every other day.   Yes [provider]    Family History  Problem Relation Age of Onset   Cancer Mother        lung   Heart attack Father    COPD Father    Heart disease Father      Social History   Tobacco Use   Smoking status: Never   Smokeless tobacco: Never  Vaping Use   Vaping status: Never Used  Substance Use Topics   Alcohol use: No   Drug use: No    Allergies as of 03/23/2023 - Review Complete 03/23/2023  Allergen Reaction Noted   Compazine Other (See Comments) 01/29/2011   Dulaglutide Other (See Comments) 03/15/2020   Sitagliptin-metformin hcl Other (See Comments) 03/15/2020   Tamoxifen citrate Other (See Comments) 03/15/2020   Aspirin Other (See Comments) 03/15/2020   Atorvastatin Other (See Comments) 03/15/2020   Bydureon [exenatide]  11/09/2016   Liraglutide Other (See Comments) 03/15/2020    Review of Systems:    All systems reviewed and negative except where noted in HPI.   Physical Exam:  BP 112/63   Pulse 72   Temp 98 F (36.7 C)   Ht 5\' 3"  (1.6 m)   Wt 125 lb (56.7 kg)   BMI 22.14 kg/m  No LMP recorded. Patient is postmenopausal. Psych:  Alert and cooperative. Normal mood and affect. General:   Alert,  Well-developed, well-nourished, pleasant and cooperative in NAD Head:  Normocephalic and atraumatic. Lungs:  Respirations even and unlabored.  Clear throughout to auscultation.   No wheezes, crackles, or rhonchi. No acute distress. Heart:  Regular rate and rhythm; no murmurs, clicks, rubs, or gallops. Abdomen:  Normal bowel sounds.  No bruits.  Soft, and non-distended without masses,  hepatosplenomegaly or hernias noted.  Mild bilateral lower abdominal Tenderness, LLQ > RLQ.  No guarding or rebound tenderness.   No upper abdominal tenderness. Neurologic:  Alert and oriented x3;  grossly normal neurologically. Psych:  Alert and cooperative. Normal mood and affect.  Imaging Studies: No results found.  Assessment and Plan:   Mellonie Villwock is a 67 y.o. y/o female has been referred for   Irritable bowel syndrome with constipation Trulance was cost prohibitive. Continue OTC Colace stool softener and add Senokot 8.6 mg 2 to 4 tablets daily if needed for constipation. She could not tolerate MiraLAX. Continue Bentyl 20 Mg 3 times daily as needed for abdominal cramping.  2.  Pancreatic insufficiency  Continue Creon 36,000 lipase units 2 with each meal and  1 with each snack.  3.  Abdominal pain / Cramps  Labs: food allergy panel, alpha gal, Mg+  Continue treatment for IBS.  4.  Colon cancer screening  We are requesting GI records from [facility].  Negative colonoscopy in 04/2014.  10-year repeat colonoscopy will be due 04/2024.    Follow up 6 months with Kathreen Devoid, PA-C

## 2023-03-24 DIAGNOSIS — Z6379 Other stressful life events affecting family and household: Secondary | ICD-10-CM | POA: Diagnosis not present

## 2023-03-24 DIAGNOSIS — F4323 Adjustment disorder with mixed anxiety and depressed mood: Secondary | ICD-10-CM | POA: Diagnosis not present

## 2023-03-28 ENCOUNTER — Encounter: Payer: Self-pay | Admitting: Physician Assistant

## 2023-03-31 DIAGNOSIS — S93409A Sprain of unspecified ligament of unspecified ankle, initial encounter: Secondary | ICD-10-CM

## 2023-03-31 DIAGNOSIS — S93401A Sprain of unspecified ligament of right ankle, initial encounter: Secondary | ICD-10-CM | POA: Diagnosis not present

## 2023-03-31 HISTORY — DX: Sprain of unspecified ligament of unspecified ankle, initial encounter: S93.409A

## 2023-04-01 ENCOUNTER — Other Ambulatory Visit: Payer: Self-pay | Admitting: Nurse Practitioner

## 2023-04-01 DIAGNOSIS — Z13820 Encounter for screening for osteoporosis: Secondary | ICD-10-CM

## 2023-04-01 DIAGNOSIS — E2839 Other primary ovarian failure: Secondary | ICD-10-CM

## 2023-04-08 DIAGNOSIS — F4323 Adjustment disorder with mixed anxiety and depressed mood: Secondary | ICD-10-CM | POA: Diagnosis not present

## 2023-04-08 DIAGNOSIS — Z6379 Other stressful life events affecting family and household: Secondary | ICD-10-CM | POA: Diagnosis not present

## 2023-04-13 DIAGNOSIS — N3000 Acute cystitis without hematuria: Secondary | ICD-10-CM | POA: Diagnosis not present

## 2023-04-13 DIAGNOSIS — R350 Frequency of micturition: Secondary | ICD-10-CM | POA: Diagnosis not present

## 2023-04-13 DIAGNOSIS — R1031 Right lower quadrant pain: Secondary | ICD-10-CM | POA: Diagnosis not present

## 2023-04-27 DIAGNOSIS — N3000 Acute cystitis without hematuria: Secondary | ICD-10-CM | POA: Diagnosis not present

## 2023-04-27 DIAGNOSIS — N3942 Incontinence without sensory awareness: Secondary | ICD-10-CM | POA: Diagnosis not present

## 2023-04-28 DIAGNOSIS — Z6379 Other stressful life events affecting family and household: Secondary | ICD-10-CM | POA: Diagnosis not present

## 2023-04-28 DIAGNOSIS — F4323 Adjustment disorder with mixed anxiety and depressed mood: Secondary | ICD-10-CM | POA: Diagnosis not present

## 2023-05-13 DIAGNOSIS — F4323 Adjustment disorder with mixed anxiety and depressed mood: Secondary | ICD-10-CM | POA: Diagnosis not present

## 2023-05-13 DIAGNOSIS — Z6379 Other stressful life events affecting family and household: Secondary | ICD-10-CM | POA: Diagnosis not present

## 2023-05-26 ENCOUNTER — Encounter: Payer: Self-pay | Admitting: Physician Assistant

## 2023-05-26 ENCOUNTER — Telehealth: Payer: Self-pay

## 2023-05-26 NOTE — Telephone Encounter (Signed)
Please advise-- I have severe stomach pain. Some time I have pain on my left side that makes it difficult to breath.   You can call me if you want to. [phone].   Thank you,   Rebecca Robertson   Spoke with patient- a few days ago she was having abdominal pain- denies nausea and vomiting- just sick feeling in her stomach-she takes  dicyclomine-having bowel movements every other day-she takes stool softener- denies fever- she states she is having pain now-pain scale 8.- she is eating but it makes her hurt-

## 2023-05-27 ENCOUNTER — Telehealth: Payer: Self-pay

## 2023-05-27 ENCOUNTER — Telehealth: Payer: Self-pay | Admitting: Physician Assistant

## 2023-05-27 MED ORDER — LUBIPROSTONE 8 MCG PO CAPS: 8.0000 ug | ORAL_CAPSULE | Freq: Two times a day (BID) | ORAL | 2 refills | Status: DC

## 2023-05-27 NOTE — Telephone Encounter (Signed)
error 

## 2023-05-27 NOTE — Telephone Encounter (Signed)
Spoke with patient -Amitiza not covered -patients cost 400.00. Please advise.

## 2023-05-27 NOTE — Telephone Encounter (Signed)
Spoke with patient- having constipation- Amitiza sent to pharmacy.  I have 2 options to treat IBS flare:  1. Amitiza (lubiprostone) 8 mcg 1 capsule twice daily with food, #60, 2 RF (if constipated).  2. Xifaxan 550mg  1 tablet three times per day for 14 days, #42, No RF(if having diarrhea).

## 2023-05-27 NOTE — Telephone Encounter (Signed)
Pt requesting call back to discuss medications

## 2023-05-28 NOTE — Telephone Encounter (Signed)
Called and left a message for call back  

## 2023-05-31 ENCOUNTER — Telehealth: Payer: Self-pay

## 2023-05-31 MED ORDER — LACTULOSE 20 GM/30ML PO SOLN: 30.0000 mL | Freq: Every day | ORAL | 1 refills | Status: DC

## 2023-05-31 NOTE — Telephone Encounter (Signed)
Spoke with patient - she continues to go back and forth with diarrhea and constipation.   Amitiza and Trulance are cost prohibitive.  Patient did not tolerate Linzess in the past (diarrhea).  I recommend try Rx lactulose 30 mL once daily to help constipation.  Please call patient and ask her about her symptoms.  Is she having constipation?  Diarrhea?  Abdominal pain?  She has history of irritable bowel syndrome mixed. Celso Amy, PA-C

## 2023-06-01 ENCOUNTER — Telehealth: Payer: Self-pay

## 2023-06-01 ENCOUNTER — Encounter: Payer: Self-pay | Admitting: Physician Assistant

## 2023-06-01 NOTE — Telephone Encounter (Signed)
Patient taking Lactulose for constipation-taking 30 ml-may need to take half the dose and drink plenty of fluids.She will let us know how the half dose works.   About 24 hours after my first dose I started having bowel movements. I went 3 times and I'm not sure if it will stop. I'm weak now. I don't think I should take today's dose. Should I take a smaller dose tomorrow.    Thank you. Rebecca Robertson

## 2023-06-03 DIAGNOSIS — Z6379 Other stressful life events affecting family and household: Secondary | ICD-10-CM | POA: Diagnosis not present

## 2023-06-03 DIAGNOSIS — F4323 Adjustment disorder with mixed anxiety and depressed mood: Secondary | ICD-10-CM | POA: Diagnosis not present

## 2023-06-08 DIAGNOSIS — Z6379 Other stressful life events affecting family and household: Secondary | ICD-10-CM | POA: Diagnosis not present

## 2023-06-08 DIAGNOSIS — F4323 Adjustment disorder with mixed anxiety and depressed mood: Secondary | ICD-10-CM | POA: Diagnosis not present

## 2023-06-30 DIAGNOSIS — Z8639 Personal history of other endocrine, nutritional and metabolic disease: Secondary | ICD-10-CM | POA: Diagnosis not present

## 2023-06-30 DIAGNOSIS — K8681 Exocrine pancreatic insufficiency: Secondary | ICD-10-CM | POA: Diagnosis not present

## 2023-06-30 DIAGNOSIS — E119 Type 2 diabetes mellitus without complications: Secondary | ICD-10-CM | POA: Diagnosis not present

## 2023-09-23 ENCOUNTER — Encounter: Payer: Self-pay | Admitting: Family Medicine

## 2023-10-05 ENCOUNTER — Ambulatory Visit
Admission: RE | Admit: 2023-10-05 | Discharge: 2023-10-05 | Disposition: A | Discharge status: Home / Self Care | Payer: Medicare (Managed Care) | Source: Ambulatory Visit | Attending: Nurse Practitioner

## 2023-10-05 DIAGNOSIS — E2839 Other primary ovarian failure: Secondary | ICD-10-CM

## 2023-10-05 DIAGNOSIS — Z13820 Encounter for screening for osteoporosis: Secondary | ICD-10-CM

## 2023-10-07 ENCOUNTER — Encounter: Payer: Self-pay | Admitting: Family Medicine

## 2023-10-07 NOTE — Progress Notes (Signed)
Hey lady. You are listed as her PCP. She has osteopenia without osteoporosis.  Herbert Seta

## 2023-10-18 ENCOUNTER — Other Ambulatory Visit: Payer: Self-pay

## 2023-10-18 NOTE — Progress Notes (Unsigned)
 Rebecca Amy, PA-C [address]  [address]  [place], [place] [address]  Main: [phone]  Fax: [phone]   Primary Care Physician: Melida Quitter, PA  Primary Gastroenterologist:  Rebecca Amy, PA-C   CC: F/U IBS-C and EPI  HPI: Rebecca Robertson is a 68 y.o. female returns for 51-month follow-up of irritable bowel syndrome and exocrine pancreatic insufficiency.  Currently taking Creon 36,000 lipase units 1 capsule with each meal and each snack.  She only eats 2 small meals per day.  Does not really eat snacks.  She takes dicyclomine 20 Mg 3 times daily which greatly helps with her IBS and abdominal cramping.  She tried lactulose 30 mL which caused a lot of diarrhea and discontinued.  Recently she has been feeling a lot better.  Currently having bowel movement every 2 or 3 days.  She takes OTC Senokot as needed for constipation.  No recent episodes of diarrhea.  No recent episodes of abdominal pain.  Overall stable and improved.  Amitiza and Trulance were cost prohibitive.  She did not tolerate Linzess which caused diarrhea.   Colonoscopy done by Dr. Madilyn Fireman at [facility] 04/2014 showed no polyps.  Current Outpatient Medications  Medication Sig Dispense Refill   acetaminophen (TYLENOL) 500 MG tablet Take 2 tablets (1,000 mg total) by mouth every 6 (six) hours as needed. 30 tablet 0   BAYER CONTOUR NEXT TEST test strip Check blood sugar daily  5   CREON [id number] units CPEP capsule Take by mouth 3 (three) times daily before meals.     dicyclomine (BENTYL) 20 MG tablet Take 20 mg by mouth 3 (three) times daily as needed.     Fluocinolone Acetonide Body 0.01 % OIL Apply 1 application topically 2 (two) times daily as needed. 4 mL 2   Lactulose 20 GM/30ML SOLN Take 30 mLs (20 g total) by mouth daily. 450 mL 1   mupirocin cream (BACTROBAN) 2 % Apply 1 application topically 2 (two) times daily. 15 g 1   nystatin ointment (MYCOSTATIN) Apply 1 application topically 2 (two)  times daily. 30 g 2   oxybutynin (DITROPAN) 5 MG tablet Take 1 tablet (5 mg total) by mouth 2 (two) times daily. 180 tablet 1   rosuvastatin (CRESTOR) 20 MG tablet Take 1 tablet (20 mg total) by mouth daily. 90 tablet 3   Semaglutide,0.25 or 0.5MG /DOS, 2 MG/1.5ML SOPN Inject 0.25 mg into the skin every Sunday.     triamcinolone (KENALOG) 0.025 % cream Apply 1 Application topically 2 (two) times daily. 80 g 2   vitamin B-12 (CYANOCOBALAMIN) 1000 MCG tablet Take 1,000 mcg by mouth every other day.     No current facility-administered medications for this visit.    Allergies as of 10/19/2023 - Review Complete 10/19/2023  Allergen Reaction Noted   Compazine Other (See Comments) 01/29/2011   Dulaglutide Other (See Comments) 03/15/2020   Sitagliptin-metformin hcl Other (See Comments) 03/15/2020   Tamoxifen citrate Other (See Comments) 03/15/2020   Aspirin Other (See Comments) 03/15/2020   Atorvastatin Other (See Comments) 03/15/2020   Bydureon [exenatide]  11/09/2016   Liraglutide Other (See Comments) 03/15/2020    Past Medical History:  Diagnosis Date   Anxiety    Diabetes mellitus    FHx: pancreatic disease    History of breast cancer ONCOLOGIST-- DR Welton Flakes--  NO RECURRENCE   S/P LEFT PARTIAL MASTECTOMY W/ SLN BX'S  12-02-2010---  DUCTAL CARCINOMA IN SITU--  S/P RADIATION THERAPY ENDED 02-17-2011  Hyperlipidemia    Personal history of radiation therapy 2012   Pneumonia    PONV (postoperative nausea and vomiting)    Sprain of ankle 03/31/2023   SUI (stress urinary incontinence, female)    Urinary tract infection 02/25/2023   Vitamin D deficiency    Vitamin D insufficiency 03/04/2016    Past Surgical History:  Procedure Laterality Date   BLADDER SUSPENSION N/A 02/03/2013   Procedure: SPARC PROCEDURE;  Surgeon: Martina Sinner, MD;  Location: [facility];  Service: Urology;  Laterality: N/A;   BREAST LUMPECTOMY Left 2012   BUNIONECTOMY     CESAREAN SECTION      X4   LEFT HEART CATH AND CORONARY ANGIOGRAPHY N/A 01/26/2018   Procedure: LEFT HEART CATH AND CORONARY ANGIOGRAPHY;  Surgeon: Iran Ouch, MD;  Location: [facility];  Service: Cardiovascular;  Laterality: N/A;   MENISCUS REPAIR Left    PARTIAL MASTECTOMY WITH AXILLARY SENTINEL LYMPH NODE BIOPSY Left 12-02-2010    Review of Systems:    All systems reviewed and negative except where noted in HPI.   Physical Examination:   BP (!) 100/59   Pulse 67   Temp 97.9 F (36.6 C)   Ht 5\' 3"  (1.6 m)   Wt 126 lb 12.8 oz (57.5 kg)   BMI 22.46 kg/m   General: Well-nourished, well-developed in no acute distress.  Neuro: Alert and oriented x 3.  Grossly intact.  Psych: Alert and cooperative, normal mood and affect.   Imaging Studies: DG BONE DENSITY (DXA) Result Date: 10/06/2023 EXAM: DUAL X-RAY ABSORPTIOMETRY (DXA) FOR BONE MINERAL DENSITY IMPRESSION: Referring Physician:  Carlean Jews Your patient completed a bone mineral density test using GE Lunar iDXA system (analysis version: 16). Technologist: HPJ PATIENT: Name: Rebecca, Robertson Patient ID: [id number] Birth Date: 03-20-1956 Height: 63.0 in. Sex: Female Measured: 10/05/2023 Weight: 125.0 lbs. Indications: Caucasian, Estrogen Deficient, Postmenopausal Fractures: NONE Treatments: ASSESSMENT: The BMD measured at Femur Neck Left is 0.780 g/cm2 with a T-score of -1.9. This patient's diagnostic category is LOW BONE MASS/OSTEOPENIA according to World Health Organization [facility]) criteria. The quality of the exam is good. Site Region Measured Date Measured Age YA BMD Significant CHANGE T-score DualFemur Neck Left  10/05/2023    66.2         -1.9    0.780 g/cm2 AP Spine  L1-L4      10[age]05/2023    66.2         -0.8    1.080 g/cm2 DualFemur Total Mean 10[age]05/2023    66.2         -1.5    0.824 g/cm2 World Health Organization [facility]) criteria for post-menopausal, Caucasian Women: Normal       T-score at or above -1 SD Osteopenia   T-score between -1 and  -2.5 SD Osteoporosis T-score at or below -2.5 SD RECOMMENDATION: 1. All patients should optimize calcium and vitamin D intake. 2. Consider FDA-approved medical therapies in postmenopausal women and men aged 27 years and older, based on the following: a. A hip or vertebral (clinical or morphometric) fracture. b. T-score = -2.5 at the femoral neck or spine after appropriate evaluation to exclude secondary causes. c. Low bone mass (T-score between -1.0 and -2.5 at the femoral neck or spine) and a 10-year probability of a hip fracture = 3% or a 10-year probability of a major osteoporosis-related fracture = 20% based on the [place]-adapted WHO algorithm. d. Clinician judgment and/or patient preferences may indicate treatment for  people with 10-year fracture probabilities above or below these levels. FOLLOW-UP: Patients with diagnosis of osteoporosis or at high risk for fracture should have regular bone mineral density tests.? Patients eligible for Medicare are allowed routine testing every 2 years.? The testing frequency can be increased to one year for patients who have rapidly progressing disease, are receiving or discontinuing medical therapy to restore bone mass, or have additional risk factors. I have reviewed this study and agree with the findings. [facility] FRAX* 10-year Probability of Fracture Based on femoral neck BMD: DualFemur (Left) Major Osteoporotic Fracture: 9.8% Hip Fracture:                1.5% Population:                  [place] (Caucasian) Risk Factors:                None *FRAX is a Armed forces logistics/support/administrative officer of the [employer] for Metabolic Bone Disease, a World Science writer (WHO) [employer]. ASSESSMENT: The probability of a major osteoporotic fracture is 9.8% within the next ten years. The probability of a hip fracture is 1.5% within the next ten years. Electronically Signed   By: Harmon Pier M.D.   On: 10/06/2023 09:54    Assessment and Plan:   Zehava Turski is a 68 y.o. y/o female returns for follow-up of:  1.  Irritable bowel syndrome, constipation predominant -currently stable and improved.  Continue dicyclomine 20 Mg 1 tablet 3 times daily as needed cramping.  Take OTC Senokot or lactulose 1 teaspoon daily as needed constipation.  Continue low FODMAP diet.  Drink 64 ounces of fluids daily.  2.  Exocrine pancreatic insufficiency -currently stable and improved.  Continue Creon 36,000 lipase units 1 or 2 capsules with each meal.  Rebecca Amy, PA-C  Follow up in 1 year or sooner if worsening symptoms.

## 2023-10-19 ENCOUNTER — Ambulatory Visit: Payer: Medicare (Managed Care) | Admitting: Physician Assistant

## 2023-10-19 ENCOUNTER — Encounter: Payer: Self-pay | Admitting: Physician Assistant

## 2023-10-19 VITALS — BP 100/59 | HR 67 | Temp 97.9°F | Ht 63.0 in | Wt 126.8 lb

## 2023-10-19 DIAGNOSIS — K589 Irritable bowel syndrome without diarrhea: Secondary | ICD-10-CM

## 2023-10-19 DIAGNOSIS — K581 Irritable bowel syndrome with constipation: Secondary | ICD-10-CM | POA: Diagnosis not present

## 2023-10-19 DIAGNOSIS — K8681 Exocrine pancreatic insufficiency: Secondary | ICD-10-CM | POA: Diagnosis not present

## 2023-10-19 MED ORDER — CREON 36000-114000 UNITS PO CPEP: 72000.0000 [IU] | ORAL_CAPSULE | Freq: Three times a day (TID) | ORAL | 11 refills | Status: AC

## 2023-10-19 MED ORDER — DICYCLOMINE HCL 20 MG PO TABS: 20.0000 mg | ORAL_TABLET | Freq: Three times a day (TID) | ORAL | 3 refills | Status: AC

## 2023-10-28 ENCOUNTER — Encounter: Payer: Self-pay | Admitting: Physician Assistant

## 2023-10-29 ENCOUNTER — Telehealth: Payer: Self-pay

## 2023-10-29 NOTE — Telephone Encounter (Signed)
 Patient requesting Creon form be sent for her medication renewal- Creon form completed and faxed today.

## 2023-12-15 NOTE — Telephone Encounter (Signed)
 Received from creon - need to complete the Physicians portion as well as the patients portion -completed and faxed to [phone]

## 2023-12-23 NOTE — Telephone Encounter (Signed)
 Called patient to let her know that her medication was approved for one year. Patient stated that she she had also received a message but thank me for giving a call to make sure. Patient had no further questions.

## 2023-12-29 ENCOUNTER — Other Ambulatory Visit: Payer: Self-pay | Admitting: Nurse Practitioner

## 2023-12-29 DIAGNOSIS — Z1231 Encounter for screening mammogram for malignant neoplasm of breast: Secondary | ICD-10-CM

## 2024-01-09 ENCOUNTER — Other Ambulatory Visit: Payer: Self-pay | Admitting: Nurse Practitioner

## 2024-01-09 DIAGNOSIS — F411 Generalized anxiety disorder: Secondary | ICD-10-CM

## 2024-01-12 DIAGNOSIS — K5900 Constipation, unspecified: Secondary | ICD-10-CM | POA: Diagnosis not present

## 2024-01-12 DIAGNOSIS — K8681 Exocrine pancreatic insufficiency: Secondary | ICD-10-CM | POA: Diagnosis not present

## 2024-01-12 DIAGNOSIS — E119 Type 2 diabetes mellitus without complications: Secondary | ICD-10-CM | POA: Diagnosis not present

## 2024-01-12 DIAGNOSIS — Z8639 Personal history of other endocrine, nutritional and metabolic disease: Secondary | ICD-10-CM | POA: Diagnosis not present

## 2024-01-28 ENCOUNTER — Telehealth: Payer: Self-pay

## 2024-01-28 NOTE — Telephone Encounter (Signed)
 Copied from CRM [id number]. Topic: Appointments - Scheduling Inquiry for Clinic >> Jan 28, 2024 11:10 AM Rebecca Robertson wrote: Reason for CRM: PT called in to schedule yearly physical and pap with new provider Rebecca Acosta, PA    When attempting to scheudle PHYSICAL, keep getting oonly option for AWV. Pt would like her yearly physical with PAP to be schedule and done same day with new female provider.    Pls call pt to scheudle [phone]

## 2024-01-31 NOTE — Telephone Encounter (Signed)
 This has been scheduled

## 2024-02-01 ENCOUNTER — Ambulatory Visit
Admission: RE | Admit: 2024-02-01 | Discharge: 2024-02-01 | Disposition: A | Discharge status: Home / Self Care | Payer: Medicare (Managed Care) | Source: Ambulatory Visit | Attending: Nurse Practitioner | Admitting: Nurse Practitioner

## 2024-02-01 DIAGNOSIS — Z1231 Encounter for screening mammogram for malignant neoplasm of breast: Secondary | ICD-10-CM | POA: Diagnosis not present

## 2024-02-23 ENCOUNTER — Other Ambulatory Visit: Payer: Self-pay | Admitting: Internal Medicine

## 2024-03-09 ENCOUNTER — Other Ambulatory Visit: Payer: Self-pay

## 2024-03-09 DIAGNOSIS — E0800 Diabetes mellitus due to underlying condition with hyperosmolarity without nonketotic hyperglycemic-hyperosmolar coma (NKHHC): Secondary | ICD-10-CM

## 2024-03-09 DIAGNOSIS — Z130 Encounter for screening for diseases of the blood and blood-forming organs and certain disorders involving the immune mechanism: Secondary | ICD-10-CM

## 2024-03-09 DIAGNOSIS — Z1159 Encounter for screening for other viral diseases: Secondary | ICD-10-CM

## 2024-03-09 DIAGNOSIS — E1169 Type 2 diabetes mellitus with other specified complication: Secondary | ICD-10-CM

## 2024-03-14 ENCOUNTER — Other Ambulatory Visit: Payer: Medicare (Managed Care)

## 2024-03-14 DIAGNOSIS — Z130 Encounter for screening for diseases of the blood and blood-forming organs and certain disorders involving the immune mechanism: Secondary | ICD-10-CM

## 2024-03-14 DIAGNOSIS — E0800 Diabetes mellitus due to underlying condition with hyperosmolarity without nonketotic hyperglycemic-hyperosmolar coma (NKHHC): Secondary | ICD-10-CM

## 2024-03-14 DIAGNOSIS — E1169 Type 2 diabetes mellitus with other specified complication: Secondary | ICD-10-CM

## 2024-03-14 DIAGNOSIS — Z1159 Encounter for screening for other viral diseases: Secondary | ICD-10-CM

## 2024-03-14 NOTE — Addendum Note (Signed)
 Addended by: ZIMMERMAN RUMPLE, Kimela Malstrom D on: 03/14/2024 10:53 AM   Modules accepted: Orders

## 2024-03-14 NOTE — Addendum Note (Signed)
 Addended by: ZIMMERMAN RUMPLE, Lyndel Sarate D on: 03/14/2024 10:54 AM   Modules accepted: Orders

## 2024-03-15 ENCOUNTER — Other Ambulatory Visit: Payer: Self-pay | Admitting: *Deleted

## 2024-03-15 DIAGNOSIS — E0800 Diabetes mellitus due to underlying condition with hyperosmolarity without nonketotic hyperglycemic-hyperosmolar coma (NKHHC): Secondary | ICD-10-CM

## 2024-03-15 LAB — LIPID PANEL

## 2024-03-16 ENCOUNTER — Ambulatory Visit: Payer: Self-pay

## 2024-03-16 LAB — MICROALBUMIN / CREATININE URINE RATIO
Creatinine, Urine: 134.9 mg/dL
Microalb/Creat Ratio: 7 mg/g{creat} (ref 0–29)
Microalbumin, Urine: 9.8 ug/mL

## 2024-03-17 LAB — CBC WITH DIFFERENTIAL/PLATELET
Basophils Absolute: 0.1 x10E3/uL (ref 0.0–0.2)
Basos: 1 %
EOS (ABSOLUTE): 0.1 x10E3/uL (ref 0.0–0.4)
Eos: 2 %
Hematocrit: 33.4 % — ABNORMAL LOW (ref 34.0–46.6)
Hemoglobin: 10.7 g/dL — ABNORMAL LOW (ref 11.1–15.9)
Immature Grans (Abs): 0 x10E3/uL (ref 0.0–0.1)
Immature Granulocytes: 0 %
Lymphocytes Absolute: 2 x10E3/uL (ref 0.7–3.1)
Lymphs: 30 %
MCH: 28.7 pg (ref 26.6–33.0)
MCHC: 32 g/dL (ref 31.5–35.7)
MCV: 90 fL (ref 79–97)
Monocytes Absolute: 0.5 x10E3/uL (ref 0.1–0.9)
Monocytes: 7 %
Neutrophils Absolute: 4.2 x10E3/uL (ref 1.4–7.0)
Neutrophils: 60 %
Platelets: 249 x10E3/uL (ref 150–450)
RBC: 3.73 x10E6/uL — ABNORMAL LOW (ref 3.77–5.28)
RDW: 12.9 % (ref 11.7–15.4)
WBC: 6.8 x10E3/uL (ref 3.4–10.8)

## 2024-03-17 LAB — HEMOGLOBIN A1C
Est. average glucose Bld gHb Est-mCnc: 180 mg/dL
Hgb A1c MFr Bld: 7.9 % — ABNORMAL HIGH (ref 4.8–5.6)

## 2024-03-17 LAB — COMPREHENSIVE METABOLIC PANEL WITH GFR
ALT: 23 IU/L (ref 0–32)
AST: 28 IU/L (ref 0–40)
Albumin: 4.3 g/dL (ref 3.9–4.9)
Alkaline Phosphatase: 94 IU/L (ref 44–121)
BUN/Creatinine Ratio: 20 (ref 12–28)
BUN: 14 mg/dL (ref 8–27)
Bilirubin Total: 0.2 mg/dL (ref 0.0–1.2)
CO2: 19 mmol/L — AB (ref 20–29)
Calcium: 9.4 mg/dL (ref 8.7–10.3)
Chloride: 104 mmol/L (ref 96–106)
Creatinine, Ser: 0.69 mg/dL (ref 0.57–1.00)
Globulin, Total: 2.9 g/dL (ref 1.5–4.5)
Glucose: 148 mg/dL — AB (ref 70–99)
Potassium: 4 mmol/L (ref 3.5–5.2)
Sodium: 138 mmol/L (ref 134–144)
Total Protein: 7.2 g/dL (ref 6.0–8.5)
eGFR: 96 mL/min/1.73 (ref >59)

## 2024-03-17 LAB — LIPID PANEL
Cholesterol, Total: 123 mg/dL (ref 100–199)
HDL: 40 mg/dL (ref >39)
LDL CALC COMMENT:: 3.1 ratio (ref 0.0–4.4)
LDL Chol Calc (NIH): 52 mg/dL (ref 0–99)
Triglycerides: 189 mg/dL — AB (ref 0–149)
VLDL Cholesterol Cal: 31 mg/dL (ref 5–40)

## 2024-03-17 LAB — HCV RNA QUANT RFLX ULTRA OR GENOTYP: HCV Quant Baseline: NOT DETECTED [IU]/mL

## 2024-03-17 LAB — MICROALBUMIN / CREATININE URINE RATIO

## 2024-03-17 LAB — TSH: TSH: 1.89 u[IU]/mL (ref 0.450–4.500)

## 2024-03-17 LAB — VITAMIN D 25 HYDROXY (VIT D DEFICIENCY, FRACTURES): Vit D, 25-Hydroxy: 38.4 ng/mL (ref 30.0–100.0)

## 2024-03-21 ENCOUNTER — Ambulatory Visit (INDEPENDENT_AMBULATORY_CARE_PROVIDER_SITE_OTHER): Payer: Medicare (Managed Care)

## 2024-03-21 ENCOUNTER — Other Ambulatory Visit (HOSPITAL_COMMUNITY)
Admission: RE | Admit: 2024-03-21 | Discharge: 2024-03-21 | Disposition: A | Discharge status: Home / Self Care | Payer: Medicare (Managed Care) | Source: Ambulatory Visit

## 2024-03-21 VITALS — BP 102/67 | HR 68 | Temp 97.6°F | Ht 63.0 in | Wt 123.1 lb

## 2024-03-21 DIAGNOSIS — E785 Hyperlipidemia, unspecified: Secondary | ICD-10-CM | POA: Diagnosis not present

## 2024-03-21 DIAGNOSIS — Z01419 Encounter for gynecological examination (general) (routine) without abnormal findings: Secondary | ICD-10-CM

## 2024-03-21 DIAGNOSIS — Z1151 Encounter for screening for human papillomavirus (HPV): Secondary | ICD-10-CM | POA: Diagnosis not present

## 2024-03-21 DIAGNOSIS — C50912 Malignant neoplasm of unspecified site of left female breast: Secondary | ICD-10-CM | POA: Diagnosis not present

## 2024-03-21 DIAGNOSIS — E0800 Diabetes mellitus due to underlying condition with hyperosmolarity without nonketotic hyperglycemic-hyperosmolar coma (NKHHC): Secondary | ICD-10-CM

## 2024-03-21 DIAGNOSIS — F32A Depression, unspecified: Secondary | ICD-10-CM | POA: Diagnosis not present

## 2024-03-21 DIAGNOSIS — Z780 Asymptomatic menopausal state: Secondary | ICD-10-CM | POA: Diagnosis not present

## 2024-03-21 DIAGNOSIS — Z124 Encounter for screening for malignant neoplasm of cervix: Secondary | ICD-10-CM | POA: Diagnosis not present

## 2024-03-21 DIAGNOSIS — E1169 Type 2 diabetes mellitus with other specified complication: Secondary | ICD-10-CM

## 2024-03-21 MED ORDER — ROSUVASTATIN CALCIUM 20 MG PO TABS: 20.0000 mg | ORAL_TABLET | Freq: Every day | ORAL | 2 refills | Status: AC

## 2024-03-21 NOTE — Assessment & Plan Note (Signed)
 Continue follow up with oncology as scheduled. Mammo and pap up-to-date as of today.

## 2024-03-21 NOTE — Assessment & Plan Note (Signed)
 Last lipid panel: LDL 52, HDL 40, Trig 189. LDL at goal with mildly elevated triglycerides. The ASCVD Risk score (Arnett DK, et al., 2019) failed to calculate for the following reasons:   The valid total cholesterol range is 130 to 320 mg/dL Continue rosuvastatin  20 mg daily. If triglycerides increase further, may consider adding fenofibrate .

## 2024-03-21 NOTE — Patient Instructions (Signed)
 It was nice to see you today!  As we discussed in clinic:  -All of your lab work was normal except for elevated A1c at 7.9 and mild anemia.  -I suspect that with the increased dose of Ozempic, we will see the A1c improve.  -For the mild anemia, I would recommend an over-the-counter iron supplement taken with vitamin C every other day for 3-4 weeks to replenish iron levels. -Please schedule your diabetic eye exam within the calendar year -You will hear from me via MyChart with the results of your pap smear this week.   I will plan on seeing you back in 1 year for your next physical! It was nice to meet you!  If you have any problems before your next visit feel free to message me via MyChart (minor issues or questions) or call the office, otherwise you may reach out to schedule an office visit.  Thank you! Saddie Sacks, PA-C

## 2024-03-21 NOTE — Progress Notes (Signed)
 Complete physical exam  Patient: Rebecca Robertson   DOB: 28-Jan-1956   68 y.o. Female  MRN: [id number]  Subjective:    Chief Complaint  Patient presents with   Annual Exam    Physical    Rebecca Robertson is a 68 y.o. female who presents today for a complete physical exam. She reports consuming a general diet. Exercises at tolerated. She generally feels well. She reports sleeping well. She does not have additional problems to discuss today.   She is also here for annual pap smear. Patient does not have hx of abnormal pap smear but prefers to have them done yearly due to her hx of breast cancer.  Most recent fall risk assessment:    03/21/2024    9:23 AM  Fall Risk   Falls in the past year? 0  Risk for fall due to : No Fall Risks  Follow up Falls evaluation completed     Most recent depression screenings:    03/21/2024    9:24 AM 02/11/2023    1:15 PM  PHQ 2/9 Scores  PHQ - 2 Score 0 0  PHQ- 9 Score  0    Vision:Not within last year  and Dental: No current dental problems and Receives regular dental care    Patient Care Team: Gayle Saddie JULIANNA DEVONNA as PCP - General (Physician Assistant) Okey Vina GAILS, MD as PCP - Cardiology (Cardiology) Dyane Rush, MD (Inactive) as Consulting Physician (Gastroenterology) Faythe Purchase, MD as Consulting Physician (Endocrinology) Ivin Kocher, MD as Consulting Physician (Dermatology) Odean Potts, MD as Consulting Physician (Hematology and Oncology) Gaston Hamilton, MD as Consulting Physician (Urology) Cammie Batters, OD as Referring Physician (Optometry)   Outpatient Medications Prior to Visit  Medication Sig   BAYER CONTOUR NEXT TEST test strip Check blood sugar daily   citalopram  (CELEXA ) 20 MG tablet Take 1 tablet by mouth once daily   CREON  36000-114000 units CPEP capsule Take 2 capsules (72,000 Units total) by mouth 3 (three) times daily before meals.   dicyclomine  (BENTYL ) 20 MG tablet Take 1 tablet (20 mg total) by  mouth 3 (three) times daily before meals.   Lactulose  20 GM/30ML SOLN Take 30 mLs (20 g total) by mouth daily.   oxybutynin  (DITROPAN ) 5 MG tablet Take 1 tablet (5 mg total) by mouth 2 (two) times daily.   Semaglutide,0.25 or 0.5MG /DOS, 2 MG/1.5ML SOPN Inject 1 mg into the skin every Sunday.   triamcinolone  (KENALOG ) 0.025 % cream Apply 1 Application topically 2 (two) times daily.   vitamin B-12 (CYANOCOBALAMIN ) 1000 MCG tablet Take 1,000 mcg by mouth every other day.   [DISCONTINUED] acetaminophen  (TYLENOL ) 500 MG tablet Take 2 tablets (1,000 mg total) by mouth every 6 (six) hours as needed.   [DISCONTINUED] Fluocinolone  Acetonide Body 0.01 % OIL Apply 1 application topically 2 (two) times daily as needed.   [DISCONTINUED] mupirocin  cream (BACTROBAN ) 2 % Apply 1 application topically 2 (two) times daily.   [DISCONTINUED] nystatin  ointment (MYCOSTATIN ) Apply 1 application topically 2 (two) times daily.   [DISCONTINUED] rosuvastatin  (CRESTOR ) 20 MG tablet Take 1 tablet by mouth once daily   No facility-administered medications prior to visit.    ROS  Per HPI      Objective:     BP 102/67   Pulse 68   Temp 97.6 F (36.4 C) (Oral)   Ht 5' 3 (1.6 m)   Wt 123 lb 1.9 oz (55.8 kg)   SpO2 99%   BMI 21.81 kg/m  Physical Exam Exam conducted with a chaperone present.  Constitutional:      General: She is not in acute distress.    Appearance: Normal appearance.  HENT:     Right Ear: Tympanic membrane normal.     Left Ear: Tympanic membrane normal.     Mouth/Throat:     Mouth: Mucous membranes are moist.     Pharynx: Oropharynx is clear.  Eyes:     Pupils: Pupils are equal, round, and reactive to light.  Cardiovascular:     Rate and Rhythm: Normal rate and regular rhythm.     Heart sounds: Normal heart sounds. No murmur heard.    No friction rub. No gallop.  Pulmonary:     Effort: Pulmonary effort is normal. No respiratory distress.     Breath sounds: Normal breath sounds.   Abdominal:     General: Abdomen is flat. Bowel sounds are normal.     Palpations: Abdomen is soft.  Genitourinary:    General: Normal vulva.     Cervix: Normal. No discharge, friability, lesion or cervical bleeding.     Adnexa: Right adnexa normal and left adnexa normal.  Musculoskeletal:        General: No swelling.     Cervical back: Normal range of motion.  Lymphadenopathy:     Cervical: No cervical adenopathy.  Skin:    General: Skin is warm and dry.  Neurological:     General: No focal deficit present.     Mental Status: She is alert.  Psychiatric:        Mood and Affect: Mood normal.        Behavior: Behavior normal.        Thought Content: Thought content normal.       No results found for any visits on 03/21/24. Last CBC Lab Results  Component Value Date   WBC 6.8 03/14/2024   HGB 10.7 (L) 03/14/2024   HCT 33.4 (L) 03/14/2024   MCV 90 03/14/2024   MCH 28.7 03/14/2024   RDW 12.9 03/14/2024   PLT 249 03/14/2024   Last metabolic panel Lab Results  Component Value Date   GLUCOSE 148 (H) 03/14/2024   NA 138 03/14/2024   K 4.0 03/14/2024   CL 104 03/14/2024   CO2 19 (L) 03/14/2024   BUN 14 03/14/2024   CREATININE 0.69 03/14/2024   EGFR 96 03/14/2024   CALCIUM  9.4 03/14/2024   PROT 7.2 03/14/2024   ALBUMIN 4.3 03/14/2024   LABGLOB 2.9 03/14/2024   AGRATIO 1.5 02/09/2022   BILITOT 0.2 03/14/2024   ALKPHOS 94 03/14/2024   AST 28 03/14/2024   ALT 23 03/14/2024   ANIONGAP 12 11/14/2021   Last lipids Lab Results  Component Value Date   CHOL 123 03/14/2024   HDL 40 03/14/2024   LDLCALC 52 03/14/2024   TRIG 189 (H) 03/14/2024   CHOLHDL 3.1 03/14/2024   Last hemoglobin A1c Lab Results  Component Value Date   HGBA1C 7.9 (H) 03/14/2024   Last thyroid  functions Lab Results  Component Value Date   TSH 1.890 03/14/2024   Last vitamin D  Lab Results  Component Value Date   VD25OH 38.4 03/14/2024        Assessment & Plan:    Routine Health  Maintenance and Physical Exam  Health Maintenance  Topic Date Due   Zoster (Shingles) Vaccine (1 of 2) Never done   Pneumococcal Vaccine for age over 57 (2 of 2 - PCV) 07/01/2010   Eye exam for  diabetics  08/24/2020   COVID-19 Vaccine (3 - Pfizer risk series) 09/02/2020   Complete foot exam   12/28/2023   Medicare Annual Wellness Visit  01/14/2024   Flu Shot  03/17/2024   Hemoglobin A1C  09/14/2024   Yearly kidney function blood test for diabetes  03/14/2025   Yearly kidney health urinalysis for diabetes  03/15/2025   Mammogram  01/31/2026   Colon Cancer Screening  12/21/2027   DTaP/Tdap/Td vaccine (3 - Td or Tdap) 12/08/2029   DEXA scan (bone density measurement)  Completed   Hepatitis C Screening  Completed   Hepatitis B Vaccine  Aged Out   HPV Vaccine  Aged Out   Meningitis B Vaccine  Aged Out    Discussed health benefits of physical activity, and encouraged her to engage in regular exercise appropriate for her age and condition.  Encounter for Papanicolaou smear of cervix -     Cytology - PAP  Malignant neoplasm of left female breast, unspecified estrogen receptor status, unspecified site of breast [facility]) Assessment & Plan: Continue follow up with oncology as scheduled. Mammo and pap up-to-date as of today.   Hyperlipidemia associated with type 2 diabetes mellitus (HCC) Assessment & Plan: Last lipid panel: LDL 52, HDL 40, Trig 189. LDL at goal with mildly elevated triglycerides. The ASCVD Risk score (Arnett DK, et al., 2019) failed to calculate for the following reasons:   The valid total cholesterol range is 130 to 320 mg/dL Continue rosuvastatin  20 mg daily. If triglycerides increase further, may consider adding fenofibrate .    Cervical smear, as part of routine gynecological examination Assessment & Plan: PAP completed-chaperone in room. Will call when results are available. Continue regular f/u with OB/GYN.   Depression, unspecified depression type Assessment &  Plan: Mood stable on Celexa  20 mg every day. Continue regular follow up with psych   Diabetes mellitus due to underlying condition with hyperosmolarity without coma, without long-term current use of insulin [facility]) Assessment & Plan: Follows with endocrinology (Dr. Faythe) every 6 months. Most recent A1c at 7.9, above goal of <7%. Patient did just increase her Ozempic to 1 mg about 4 weeks ago at her follow up with Dr. Faythe. Foot exam UTD. Patient has eye exam scheduled for this year. UACR UTD. If A1c does not improve at follow up, consider increasing Ozempic or adding Jardiance if affordable.   Other orders -     Rosuvastatin  Calcium ; Take 1 tablet (20 mg total) by mouth daily.  Dispense: 90 tablet; Refill: 2       Return in about 1 year (around 03/21/2025) for Physical and pap if needed.     Saddie JULIANNA Sacks, PA-C

## 2024-03-21 NOTE — Assessment & Plan Note (Signed)
 Mood stable on Celexa  20 mg every day. Continue regular follow up with psych

## 2024-03-21 NOTE — Assessment & Plan Note (Signed)
PAP completed-chaperone in room. Will call when results are available. Continue regular f/u with OB/GYN.

## 2024-03-21 NOTE — Assessment & Plan Note (Signed)
 Follows with endocrinology (Dr. Faythe) every 6 months. Most recent A1c at 7.9, above goal of <7%. Patient did just increase her Ozempic to 1 mg about 4 weeks ago at her follow up with Dr. Faythe. Foot exam UTD. Patient has eye exam scheduled for this year. UACR UTD. If A1c does not improve at follow up, consider increasing Ozempic or adding Jardiance if affordable.

## 2024-03-27 ENCOUNTER — Ambulatory Visit: Payer: Self-pay

## 2024-03-27 LAB — CYTOLOGY - PAP
Chlamydia: NEGATIVE
Comment: NEGATIVE
Comment: NEGATIVE
Comment: NEGATIVE
Comment: NORMAL
Diagnosis: NEGATIVE
High risk HPV: NEGATIVE
Neisseria Gonorrhea: NEGATIVE
Trichomonas: NEGATIVE

## 2024-04-10 ENCOUNTER — Other Ambulatory Visit: Payer: Self-pay | Admitting: Hematology and Oncology

## 2024-04-10 DIAGNOSIS — F411 Generalized anxiety disorder: Secondary | ICD-10-CM

## 2024-04-12 ENCOUNTER — Other Ambulatory Visit: Payer: Self-pay | Admitting: Hematology and Oncology

## 2024-04-12 DIAGNOSIS — F411 Generalized anxiety disorder: Secondary | ICD-10-CM

## 2024-04-13 NOTE — Telephone Encounter (Signed)
 Order by another MD 3 days ago

## 2024-05-05 DIAGNOSIS — R630 Anorexia: Secondary | ICD-10-CM | POA: Diagnosis not present

## 2024-05-05 DIAGNOSIS — E119 Type 2 diabetes mellitus without complications: Secondary | ICD-10-CM | POA: Diagnosis not present

## 2024-05-05 DIAGNOSIS — K8681 Exocrine pancreatic insufficiency: Secondary | ICD-10-CM | POA: Diagnosis not present

## 2024-05-05 DIAGNOSIS — Z8639 Personal history of other endocrine, nutritional and metabolic disease: Secondary | ICD-10-CM | POA: Diagnosis not present

## 2024-05-08 ENCOUNTER — Telehealth: Payer: Self-pay | Admitting: Gastroenterology

## 2024-05-08 NOTE — Telephone Encounter (Signed)
 Good Afternoon Rebecca Robertson,  I received a call from this patient stating that she was once under your care over at [facility] and is now requesting to transfer her care of to you her at [facility]. Patient is currently on Creon  due to her having pancreatic insuffiencey. Patient records can be viewed on EPIC. Would you please advise on scheduling this patient.   Thank you.

## 2024-05-09 ENCOUNTER — Encounter: Payer: Self-pay | Admitting: Physician Assistant

## 2024-05-09 DIAGNOSIS — Z8481 Family history of carrier of genetic disease: Secondary | ICD-10-CM | POA: Diagnosis not present

## 2024-05-09 DIAGNOSIS — Z1589 Genetic susceptibility to other disease: Secondary | ICD-10-CM | POA: Diagnosis not present

## 2024-05-09 DIAGNOSIS — Z1371 Encounter for nonprocreative screening for genetic disease carrier status: Secondary | ICD-10-CM | POA: Diagnosis not present

## 2024-05-09 DIAGNOSIS — Z822 Family history of deafness and hearing loss: Secondary | ICD-10-CM | POA: Diagnosis not present

## 2024-05-22 ENCOUNTER — Other Ambulatory Visit: Payer: Self-pay

## 2024-05-22 DIAGNOSIS — R350 Frequency of micturition: Secondary | ICD-10-CM

## 2024-05-22 MED ORDER — OXYBUTYNIN CHLORIDE 5 MG PO TABS: 5.0000 mg | ORAL_TABLET | Freq: Two times a day (BID) | ORAL | 1 refills | Status: DC

## 2024-05-22 NOTE — Telephone Encounter (Signed)
 Copied from CRM [id number]. Topic: Clinical - Medication Refill >> May 22, 2024  2:02 PM Mia F wrote: Medication: oxybutynin  (DITROPAN ) 5 MG tablet   Has the patient contacted their pharmacy? Yes (Agent: If no, request that the patient contact the pharmacy for the refill. If patient does not wish to contact the pharmacy document the reason why and proceed with request.) (Agent: If yes, when and what did the pharmacy advise?)  This is the patient's preferred pharmacy:  [facility] [address] ([address]), [place] - [address] [place] ([place]) [place] [address] Phone: [phone] Fax: [phone]  Is this the correct pharmacy for this prescription? Yes If no, delete pharmacy and type the correct one.   Has the prescription been filled recently? No  Is the patient out of the medication? Yes  Has the patient been seen for an appointment in the last year OR does the patient have an upcoming appointment? Yes  Can we respond through MyChart? Yes  Agent: Please be advised that Rx refills may take up to 3 business days. We ask that you follow-up with your pharmacy.

## 2024-05-24 ENCOUNTER — Ambulatory Visit: Payer: Self-pay

## 2024-05-24 NOTE — Telephone Encounter (Signed)
 Called and spoke with patient. Patient is schedule for OV tomorrow at 10:10.

## 2024-05-24 NOTE — Telephone Encounter (Signed)
 Copied from CRM [id number]. Topic: Clinical - Red Word Triage >> May 24, 2024  1:15 PM Kevelyn M wrote: Patient has had UTI symptoms and her   FYI Only or Action Required?: Action required by provider: request for appointment.  Patient was last seen in primary care on 03/21/2024 by Gayle Saddie FALCON, PA-C.  Called Nurse Triage reporting Dysuria.  Symptoms began x couple of weeks.  Interventions attempted: Nothing.  Symptoms are: gradually worsening.  Triage Disposition: See Physician Within 24 Hours  Patient/caregiver understands and will follow disposition?: No, wishes to speak with PCP   endocrinologist started her on a new medication. The medication is making the UTI symptoms worse. At the end of urination she is in severe pain with frequent urination. Reason for Disposition  Unusual vaginal discharge (e.g., bad smelling, yellow, green, or foamy-white)  Answer Assessment - Initial Assessment Questions 1. SEVERITY: How bad is the pain?  (e.g., Scale 1-10; mild, moderate, or severe)     8/10 2. FREQUENCY: How many times have you had painful urination today?      Frequency, urgency with each urine 3. PATTERN: Is pain present every time you urinate or just sometimes?      Only with urination 4. ONSET: When did the painful urination start?      X couple weeks 5. FEVER: Do you have a fever? If Yes, ask: What is your temperature, how was it measured, and when did it start?     no 6. PAST UTI: Have you had a urine infection before? If Yes, ask: When was the last time? and What happened that time?      Yes and had to get ABX 7. CAUSE: What do you think is causing the painful urination?  (e.g., UTI, scratch, Herpes sore)     unsure 8. OTHER SYMPTOMS: Do you have any other symptoms? (e.g., blood in urine, flank pain, genital sores, urgency, vaginal discharge)     Feel bad, shaky, doesn't want to go anywhere 9. PREGNANCY: Is there any chance you are pregnant? When was  your last menstrual period?     na   end of urination she is in severe pain with frequent urination.  Pt states this pain is different from previous UTI.  endocrinologist started her on a new medication. The medication is making the UTI symptoms worse.  Leaking urine.  No appts available with PCP: pt requested a message be sent to her PCP to see if pt can be worked into schedule,.  Pt only wants to be seen by a female provider.  Protocols used: Urination Pain - Female-A-AH

## 2024-05-25 ENCOUNTER — Ambulatory Visit: Payer: Medicare (Managed Care)

## 2024-05-25 VITALS — BP 129/79 | HR 56 | Temp 97.9°F | Ht 63.0 in | Wt 124.1 lb

## 2024-05-25 DIAGNOSIS — R300 Dysuria: Secondary | ICD-10-CM | POA: Diagnosis not present

## 2024-05-25 DIAGNOSIS — L309 Dermatitis, unspecified: Secondary | ICD-10-CM | POA: Diagnosis not present

## 2024-05-25 LAB — POCT URINALYSIS DIP (CLINITEK)
Bilirubin, UA: NEGATIVE
Glucose, UA: >=1000 mg/dL — AB
Ketones, POC UA: NEGATIVE mg/dL
Nitrite, UA: NEGATIVE
Spec Grav, UA: 1.01 (ref 1.010–1.025)
Urobilinogen, UA: 0.2 U/dL
pH, UA: 5.5 (ref 5.0–8.0)

## 2024-05-25 MED ORDER — PHENAZOPYRIDINE HCL 100 MG PO TABS: 100.0000 mg | ORAL_TABLET | Freq: Three times a day (TID) | ORAL | 0 refills | Status: DC | PRN

## 2024-05-25 MED ORDER — CEPHALEXIN 500 MG PO CAPS: 500.0000 mg | ORAL_CAPSULE | Freq: Three times a day (TID) | ORAL | 0 refills | Status: DC

## 2024-05-25 NOTE — Progress Notes (Signed)
 Established Patient Office Visit  Subjective   Patient ID: Rebecca Robertson, female    DOB: 1955-09-18  Age: 68 y.o. MRN: [id number]  Chief Complaint  Patient presents with   Urinary Tract Infection    Onset:     HPI   History of Present Illness   Rebecca Robertson is a 68 year old female who presents with bladder pain and urinary frequency.  Bladder pain and urinary frequency - Bladder pain and urinary frequency for the past two weeks - Urination occurs every 1.5 hours - Significant pain at the end of urination - Soreness from frequent urination - Inquires about external treatments for discomfort  Recent medication changes and adverse effects - Started Jardiance last Friday - Since starting Jardiance, increased urinary frequency and feeling miserable - Previously on Ozempic, which caused significant side effects including shaking  History of urologic procedures and urinary tract infections - Sling procedure performed one year ago for prior urinary issues - Questions if current symptoms could be related to the sling procedure - History of urinary tract infections, last treated in 2018 with Keflex  after urine culture - Past use of Cipro  for urinary tract infection - Difficulty following up with urologist due to insurance and scheduling issues. Requesting referral to new urologist that will accept her insurance. Previously was being seen at [facility].  Cutaneous symptoms - Rash on back of thighs, described as flaky and itchy, resembling eczema - Dermatologist-prescribed cream alleviates itching but does not resolve the rash        ROS Per HPI.    Objective:     BP 129/79   Pulse (!) 56   Temp 97.9 F (36.6 C) (Oral)   Ht 5' 3 (1.6 m)   Wt 124 lb 1.9 oz (56.3 kg)   SpO2 100%   BMI 21.99 kg/m    Physical Exam Constitutional:      General: She is not in acute distress.    Appearance: Normal appearance.  Cardiovascular:     Rate and  Rhythm: Normal rate and regular rhythm.     Heart sounds: Normal heart sounds. No murmur heard.    No friction rub. No gallop.  Pulmonary:     Effort: Pulmonary effort is normal. No respiratory distress.     Breath sounds: Normal breath sounds.  Abdominal:     Tenderness: There is abdominal tenderness in the suprapubic area. There is no right CVA tenderness or left CVA tenderness.  Musculoskeletal:        General: No swelling.  Skin:    General: Skin is warm and dry.  Neurological:     General: No focal deficit present.     Mental Status: She is alert.  Psychiatric:        Mood and Affect: Mood normal.        Behavior: Behavior normal.        Thought Content: Thought content normal.      Results for orders placed or performed in visit on 05/25/24  POCT URINALYSIS DIP (CLINITEK)  Result Value Ref Range   Color, UA yellow (A) yellow   Clarity, UA cloudy (A) clear   Glucose, UA >=1,000 (A) negative mg/dL   Bilirubin, UA negative negative   Ketones, POC UA negative negative mg/dL   Spec Grav, UA 8.989 8.989 - 1.025   Blood, UA small (A) negative   pH, UA 5.5 5.0 - 8.0   POC PROTEIN,UA trace negative, trace   Urobilinogen, UA  0.2 0.2 or 1.0 E.U./dL   Nitrite, UA Negative Negative   Leukocytes, UA Small (1+) (A) Negative      The ASCVD Risk score (Arnett DK, et al., 2019) failed to calculate for the following reasons:   The valid total cholesterol range is 130 to 320 mg/dL    Assessment & Plan:   Dysuria Assessment & Plan: Symptoms worsened after starting Jardiance, which can exacerbate UTI symptoms. POCT urinalysis suggests UTI with small leuks and blood. Past cultures showing sensitivity to Keflex . Jardiance beneficial for A1c control. - Prescribed Keflex  three times a day for five days with food. - Sent urine for culture to confirm antibiotic sensitivity; adjust treatment if resistance to Keflex . - Prescribed Pyridium  for bladder pain, up to three times a day for two  to three days. - Recommended 1% hydrocortisone cream for external vaginal soreness, once daily for three to four days. Advised not to do this for longer than this recommended time period to prevent skin thinning and worsening atrophy.  - Continue Jardiance for diabetes management.  Orders: -     Ambulatory referral to Urology -     Urine Culture -     POCT URINALYSIS DIP (CLINITEK)  Eczema, unspecified type Assessment & Plan: Eczema with itchy, flaky rash on thumbs and other areas. Current cream alleviates itching but not rash. - Continue using the steroid cream prescribed by the dermatologist. - Recommend Eucerin or CeraVe moisturizing cream with the steroid cream.    Other orders -     Cephalexin ; Take 1 capsule (500 mg total) by mouth 3 (three) times daily.  Dispense: 15 capsule; Refill: 0 -     Phenazopyridine  HCl; Take 1 tablet (100 mg total) by mouth 3 (three) times daily as needed for pain.  Dispense: 10 tablet; Refill: 0     Return if symptoms worsen or fail to improve.    Saddie JULIANNA Sacks, PA-C

## 2024-05-25 NOTE — Assessment & Plan Note (Signed)
 Eczema with itchy, flaky rash on thumbs and other areas. Current cream alleviates itching but not rash. - Continue using the steroid cream prescribed by the dermatologist. - Recommend Eucerin or CeraVe moisturizing cream with the steroid cream.

## 2024-05-25 NOTE — Patient Instructions (Signed)
 VISIT SUMMARY: Today, we addressed your bladder pain and urinary frequency, which have worsened since starting Jardiance. We also discussed your ongoing eczema symptoms.  YOUR PLAN: URINARY TRACT INFECTION: Your symptoms worsened after starting Jardiance, which can make UTI symptoms worse. We are treating you for a possible UTI. -Take Keflex  three times a day for five days with food. -We sent your urine for culture to confirm the right antibiotic; we will adjust your treatment if needed. -Take Pyridium  for bladder pain, up to three times a day for two to three days. -Apply 1% hydrocortisone cream for external soreness, once daily for three to four days. -Continue taking Jardiance for diabetes management.  ECZEMA: You have an itchy, flaky rash on your thumbs and other areas that resembles eczema. -Continue using the steroid cream prescribed by your dermatologist. -Use Eucerin or CeraVe moisturizing cream along with the steroid cream.    If you have any problems before your next visit feel free to message me via MyChart (minor issues or questions) or call the office, otherwise you may reach out to schedule an office visit.  Thank you! Saddie Sacks, PA-C

## 2024-05-25 NOTE — Assessment & Plan Note (Signed)
 Symptoms worsened after starting Jardiance, which can exacerbate UTI symptoms. POCT urinalysis suggests UTI with small leuks and blood. Past cultures showing sensitivity to Keflex . Jardiance beneficial for A1c control. - Prescribed Keflex  three times a day for five days with food. - Sent urine for culture to confirm antibiotic sensitivity; adjust treatment if resistance to Keflex . - Prescribed Pyridium  for bladder pain, up to three times a day for two to three days. - Recommended 1% hydrocortisone cream for external vaginal soreness, once daily for three to four days. Advised not to do this for longer than this recommended time period to prevent skin thinning and worsening atrophy.  - Continue Jardiance for diabetes management.

## 2024-05-29 LAB — URINE CULTURE

## 2024-05-30 ENCOUNTER — Ambulatory Visit: Payer: Self-pay

## 2024-05-30 ENCOUNTER — Telehealth: Payer: Self-pay

## 2024-05-30 NOTE — Progress Notes (Unsigned)
 05/31/2024 9:18 PM   Rebecca Robertson [birth year] [id number]  Referring provider: Gayle Saddie FALCON, PA-C [address] [place],  [place] [address]  Urological history: 1. SUI - SPARC sling (2014)   2. rUTI's - May 25, 2024 - Klebsiella pneumoniae  No chief complaint on file.  HPI: Rebecca Robertson is a 68 y.o. woman who is a patient of Dr. Thane, but they no longer take her insurance, who presents for dysuria.   Previous records reviewed.  UA ***  PMH: Past Medical History:  Diagnosis Date   Anxiety    Diabetes mellitus    FHx: pancreatic disease    History of breast cancer ONCOLOGIST-- DR FERNAND--  NO RECURRENCE   S/P LEFT PARTIAL MASTECTOMY W/ SLN BX'S  12-02-2010---  DUCTAL CARCINOMA IN SITU--  S/P RADIATION THERAPY ENDED 02-17-2011   Hyperlipidemia    Personal history of radiation therapy 2012   Pneumonia    PONV (postoperative nausea and vomiting)    Sprain of ankle 03/31/2023   SUI (stress urinary incontinence, female)    Urinary tract infection 02/25/2023   Vitamin D  deficiency    Vitamin D  insufficiency 03/04/2016    Surgical History: Past Surgical History:  Procedure Laterality Date   BLADDER SUSPENSION N/A 02/03/2013   Procedure: SPARC PROCEDURE;  Surgeon: Glendia DELENA Elizabeth, MD;  Location: [facility];  Service: Urology;  Laterality: N/A;   BREAST BIOPSY Right 2014   BREAST LUMPECTOMY Left 2012   BUNIONECTOMY     CESAREAN SECTION     X4   LEFT HEART CATH AND CORONARY ANGIOGRAPHY N/A 01/26/2018   Procedure: LEFT HEART CATH AND CORONARY ANGIOGRAPHY;  Surgeon: Darron Deatrice DELENA, MD;  Location: [facility];  Service: Cardiovascular;  Laterality: N/A;   MENISCUS REPAIR Left    PARTIAL MASTECTOMY WITH AXILLARY SENTINEL LYMPH NODE BIOPSY Left 12/02/2010    Home Medications:  Allergies as of 05/31/2024       Reactions   Compazine Other (See Comments)   Possible seizure, unable to talk, eyes rolled  toward back of head, mouth became dry.   Dulaglutide Other (See Comments)   GI upset   Sitagliptin Phos-metformin Hcl Other (See Comments)   GI upset   Tamoxifen Citrate Other (See Comments)   Aspirin  Other (See Comments)   bruising   Atorvastatin  Other (See Comments)   fatigue   Bydureon [exenatide]    Severe burning with injection   Liraglutide Other (See Comments)   GI upset        Medication List        Accurate as of May 30, 2024  9:18 PM. If you have any questions, ask your nurse or doctor.          Bayer Contour Next Test test strip Generic drug: glucose blood Check blood sugar daily   cephALEXin  500 MG capsule Commonly known as: KEFLEX  Take 1 capsule (500 mg total) by mouth 3 (three) times daily.   citalopram  20 MG tablet Commonly known as: CELEXA  Take 1 tablet by mouth once daily   Creon  36000-114000 units Cpep capsule Generic drug: lipase/protease/amylase Take 2 capsules (72,000 Units total) by mouth 3 (three) times daily before meals.   cyanocobalamin  1000 MCG tablet Commonly known as: VITAMIN B12 Take 1,000 mcg by mouth every other day.   dicyclomine  20 MG tablet Commonly known as: BENTYL  Take 1 tablet (20 mg total) by mouth 3 (three) times daily before meals.  Lactulose  20 GM/30ML Soln Take 30 mLs (20 g total) by mouth daily.   oxybutynin  5 MG tablet Commonly known as: DITROPAN  Take 1 tablet (5 mg total) by mouth 2 (two) times daily.   phenazopyridine  100 MG tablet Commonly known as: Pyridium  Take 1 tablet (100 mg total) by mouth 3 (three) times daily as needed for pain.   rosuvastatin  20 MG tablet Commonly known as: CRESTOR  Take 1 tablet (20 mg total) by mouth daily.   Semaglutide(0.25 or 0.5MG /DOS) 2 MG/1.5ML Sopn Inject 1 mg into the skin every Sunday.   triamcinolone  0.025 % cream Commonly known as: KENALOG  Apply 1 Application topically 2 (two) times daily.        Allergies:  Allergies  Allergen Reactions    Compazine Other (See Comments)    Possible seizure, unable to talk, eyes rolled toward back of head, mouth became dry.   Dulaglutide Other (See Comments)    GI upset   Sitagliptin Phos-Metformin Hcl Other (See Comments)    GI upset   Tamoxifen Citrate Other (See Comments)   Aspirin  Other (See Comments)    bruising   Atorvastatin  Other (See Comments)    fatigue   Bydureon [Exenatide]     Severe burning with injection   Liraglutide Other (See Comments)    GI upset    Family History: Family History  Problem Relation Age of Onset   Cancer Mother        lung   Heart attack Father    COPD Father    Heart disease Father     Social History:  reports that she has never smoked. She has never used smokeless tobacco. She reports that she does not drink alcohol and does not use drugs.  ROS: Pertinent ROS in HPI  Physical Exam: There were no vitals taken for this visit.  Constitutional:  Well nourished. Alert and oriented, No acute distress. HEENT: [place] AT, moist mucus membranes.  Trachea midline, no masses. Cardiovascular: No clubbing, cyanosis, or edema. Respiratory: Normal respiratory effort, no increased work of breathing. GU: No CVA tenderness.  No bladder fullness or masses.  Recession of labia minora, dry, pale vulvar vaginal mucosa and loss of mucosal ridges and folds.  Normal urethral meatus, no lesions, no prolapse, no discharge.   No urethral masses, tenderness and/or tenderness. No bladder fullness, tenderness or masses. *** vagina mucosa, *** estrogen effect, no discharge, no lesions, *** pelvic support, *** cystocele and *** rectocele noted.  No cervical motion tenderness.  Uterus is freely mobile and non-fixed.  No adnexal/parametria masses or tenderness noted.  Anus and perineum are without rashes or lesions.   ***  Neurologic: Grossly intact, no focal deficits, moving all 4 extremities. Psychiatric: Normal mood and affect.    Laboratory Data: See Epic and HPI   I have  reviewed the labs.   Pertinent Imaging: N/A  Assessment & Plan:  ***  1. Dysuria - UA ***  2. GSM - ***  No follow-ups on file.  These notes generated with voice recognition software. I apologize for typographical errors.  CLOTILDA HELON RIGGERS  [facility] [address]  [address] [place], [place] [address] [phone]

## 2024-05-30 NOTE — Telephone Encounter (Signed)
 Called and spoke with patient/ related message from provider.

## 2024-05-31 ENCOUNTER — Encounter: Payer: Self-pay | Admitting: Urology

## 2024-05-31 ENCOUNTER — Ambulatory Visit: Payer: Medicare (Managed Care) | Admitting: Urology

## 2024-05-31 VITALS — BP 133/77 | HR 59 | Ht 63.0 in | Wt 121.0 lb

## 2024-05-31 DIAGNOSIS — N952 Postmenopausal atrophic vaginitis: Secondary | ICD-10-CM | POA: Diagnosis not present

## 2024-05-31 DIAGNOSIS — R350 Frequency of micturition: Secondary | ICD-10-CM | POA: Diagnosis not present

## 2024-05-31 DIAGNOSIS — N390 Urinary tract infection, site not specified: Secondary | ICD-10-CM

## 2024-05-31 LAB — URINALYSIS, COMPLETE
Bilirubin, UA: NEGATIVE
Ketones, UA: NEGATIVE
Leukocytes,UA: NEGATIVE
Nitrite, UA: NEGATIVE
Protein,UA: NEGATIVE
Specific Gravity, UA: 1.01 (ref 1.005–1.030)
Urobilinogen, Ur: 0.2 mg/dL (ref 0.2–1.0)
pH, UA: 6 (ref 5.0–7.5)

## 2024-05-31 LAB — MICROSCOPIC EXAMINATION

## 2024-05-31 LAB — BLADDER SCAN AMB NON-IMAGING: Scan Result: 0

## 2024-05-31 MED ORDER — AMOXICILLIN-POT CLAVULANATE 875-125 MG PO TABS: 1.0000 | ORAL_TABLET | Freq: Two times a day (BID) | ORAL | 0 refills | Status: DC

## 2024-05-31 MED ORDER — ESTRADIOL 0.01 % VA CREA: TOPICAL_CREAM | VAGINAL | 12 refills | Status: DC

## 2024-05-31 MED ORDER — GEMTESA 75 MG PO TABS: 75.0000 mg | ORAL_TABLET | Freq: Every day | ORAL | Status: DC

## 2024-06-05 ENCOUNTER — Telehealth: Payer: Self-pay

## 2024-06-05 MED ORDER — FLUCONAZOLE 150 MG PO TABS: 150.0000 mg | ORAL_TABLET | Freq: Once | ORAL | 0 refills | Status: AC

## 2024-06-05 NOTE — Telephone Encounter (Signed)
 Pt complaining of itching and burning and hurts with wiping and hurts in and outside of vagina and rectum

## 2024-06-09 LAB — CULTURE, URINE COMPREHENSIVE

## 2024-06-11 ENCOUNTER — Ambulatory Visit: Payer: Self-pay | Admitting: Urology

## 2024-06-12 NOTE — Progress Notes (Signed)
 Called and left a voicemail to call back.

## 2024-06-19 ENCOUNTER — Telehealth: Payer: Self-pay

## 2024-06-19 DIAGNOSIS — N952 Postmenopausal atrophic vaginitis: Secondary | ICD-10-CM

## 2024-06-19 MED ORDER — FLUCONAZOLE 150 MG PO TABS: 150.0000 mg | ORAL_TABLET | Freq: Once | ORAL | 0 refills | Status: AC

## 2024-06-19 NOTE — Telephone Encounter (Signed)
 Pt called in with c/o of itching of vagina after having been on 2 rounds of antibiotics. Pt states the first pill of difulcan worked for a couple days but she's still having the itching bad. I instructed pt on getting some over the counter cream to help with a yeast infections. We are calling in another difulcan for her and she was instructed to use the cream as well to help while the difulcan has time to take effect. Pt voiced understanding.

## 2024-06-29 NOTE — Progress Notes (Signed)
 "     Rebecca Jolliffe, PA-C [address] [place], [place]  [address] Phone: [phone]   Primary Care Physician: Gayle Saddie FALCON, [place]  Primary Gastroenterologist:  Ellouise Console, PA-C / Glendia Holt, MD   Chief Complaint:  F/U IBS and EPI       HPI:   Discussed the use of AI scribe software for clinical note transcription with the patient, who gave verbal consent to proceed.  68 year old female returns for 32-month follow-up of irritable bowel syndrome with constipation and exocrine pancreatic insufficiency.  I last saw her at [facility] 10/2023.  She is currently taking Creon  36,000 lipase units 1 capsule with each meal and snack.  Dicyclomine  20 mg 3 times daily as needed.  OTC Senokot as needed for constipation.  Also uses low FODMAP diet.  Her symptoms have been stable and improved on this treatment.  She did not tolerate lactulose  which caused diarrhea.  Amitiza  and Trulance were cost prohibitive.  Linzess caused diarrhea.  03/14/2024 labs through her PCP significant for low hemoglobin 10.7, hematocrit 33, MCV 90.  Baseline hemoglobin has been between 13 and 14 for the past few years.  01/2020 EGD by Dr. Burnette: Normal esophagus, stomach, and duodenum.  Duodenal biopsies negative for celiac.  12/2017 colonoscopy by Dr. Burnette: Small internal hemorrhoids, otherwise normal.  No polyps.  Terminal ileum normal.  Good prep.  Biopsies negative for microscopic colitis.  10-year repeat.  04/2014 colonoscopy by Dr. Dyane at [facility]: No polyps.  Colon biopsies negative for microscopic colitis or IBD.    03/2023: Negative alpha gal panel.  Negative food allergy  panel.  03/14/2024 labs: Hemoglobin 10.7, hematocrit 33, MCV 90, WBC 6.8.  Glucose 148, otherwise normal CMP.  A1c 7.9.  Previous labs 1 year ago showed baseline hemoglobin 14G.  She was started on OTC iron tablet once daily and also takes vitamin B12, 3 days/week.  She does donate blood at the [facility] and has donated blood in the  past 6 months. History of Present Illness She is followed by endocrinologist Dr. Faythe.  In the past few months was taken off Ozempic due to adverse side effects.  Was switched to Jardiance and is tolerating that better.  Many of her GI symptoms have resolved since stopping Ozempic.  She denies abdominal pain, heartburn, dysphagia, nausea, vomiting, or diarrhea.  She has bowel movement daily or every other day with mild occasional constipation.  She has occasional mild bright red blood on the tissue which she attributes to hemorrhoids after having hard bowel movement.  PMH: Diabetes, EPI, GERD, IBS, dyslipidemia, history of breast cancer.    Current Outpatient Medications  Medication Sig Dispense Refill   BAYER CONTOUR NEXT TEST test strip Check blood sugar daily  5   citalopram  (CELEXA ) 20 MG tablet Take 1 tablet by mouth once daily 90 tablet 0   CREON  36000-114000 units CPEP capsule Take 2 capsules (72,000 Units total) by mouth 3 (three) times daily before meals. 180 capsule 11   dicyclomine  (BENTYL ) 20 MG tablet Take 1 tablet (20 mg total) by mouth 3 (three) times daily before meals. 270 tablet 3   EMBECTA PEN NEEDLE NANO 2 GEN 32G X 4 MM MISC Once a day; Duration: 90 days     estradiol  (ESTRACE ) 0.01 % CREA vaginal cream Apply one pea-sized amount around the opening of the urethra daily for 30 days, then 3 times weekly moving forward. 42.5 g 12   Lactulose  20 GM/30ML SOLN  Take 30 mLs (20 g total) by mouth daily. 450 mL 1   LANTUS SOLOSTAR 100 UNIT/ML Solostar Pen Inject 14 Units into the skin daily.     rosuvastatin  (CRESTOR ) 20 MG tablet Take 1 tablet (20 mg total) by mouth daily. 90 tablet 2   triamcinolone  (KENALOG ) 0.025 % cream Apply 1 Application topically 2 (two) times daily. 80 g 2   Vibegron  (GEMTESA ) 75 MG TABS Take 1 tablet (75 mg total) by mouth daily.     vitamin B-12 (CYANOCOBALAMIN ) 1000 MCG tablet Take 1,000 mcg by mouth every other day.     No current facility-administered  medications for this visit.    Allergies as of 06/30/2024 - Review Complete 06/30/2024  Allergen Reaction Noted   Compazine Other (See Comments) 01/29/2011   Dulaglutide Other (See Comments) 03/15/2020   Sitagliptin phos-metformin hcl Other (See Comments) 03/15/2020   Other Other (See Comments) 06/30/2024   Tamoxifen citrate Other (See Comments) 03/15/2020   Aspirin  Other (See Comments) 03/15/2020   Atorvastatin  Other (See Comments) 03/15/2020   Bydureon [exenatide]  11/09/2016   Liraglutide Other (See Comments) 03/15/2020    Past Medical History:  Diagnosis Date   Anxiety    Diabetes mellitus    FHx: pancreatic disease    History of breast cancer ONCOLOGIST-- DR FERNAND--  NO RECURRENCE   S/P LEFT PARTIAL MASTECTOMY W/ SLN BX'S  12-02-2010---  DUCTAL CARCINOMA IN SITU--  S/P RADIATION THERAPY ENDED 02-17-2011   Hyperlipidemia    Personal history of radiation therapy 2012   Pneumonia    PONV (postoperative nausea and vomiting)    Sprain of ankle 03/31/2023   SUI (stress urinary incontinence, female)    Urinary tract infection 02/25/2023   Vitamin D  deficiency    Vitamin D  insufficiency 03/04/2016    Past Surgical History:  Procedure Laterality Date   BLADDER SUSPENSION N/A 02/03/2013   Procedure: SPARC PROCEDURE;  Surgeon: Glendia DELENA Elizabeth, MD;  Location: [facility];  Service: Urology;  Laterality: N/A;   BREAST BIOPSY Right 2014   BREAST LUMPECTOMY Left 2012   BUNIONECTOMY     CESAREAN SECTION     X4   LEFT HEART CATH AND CORONARY ANGIOGRAPHY N/A 01/26/2018   Procedure: LEFT HEART CATH AND CORONARY ANGIOGRAPHY;  Surgeon: Darron Deatrice DELENA, MD;  [facility];  Service: Cardiovascular;  Laterality: N/A;   MENISCUS REPAIR Left    PARTIAL MASTECTOMY WITH AXILLARY SENTINEL LYMPH NODE BIOPSY Left 12/02/2010    Review of Systems:    All systems reviewed and negative except where noted in HPI.    Physical Exam:  BP 110/68   Pulse 68   Ht  5' 3 (1.6 m)   Wt 137 lb 9.6 oz (62.4 kg)   BMI 24.37 kg/m  No LMP recorded. Patient is postmenopausal.  General: Well-nourished, well-developed in no acute distress.  Lungs: Clear to auscultation bilaterally. Non-labored. Heart: Regular rate and rhythm, no murmurs rubs or gallops.  Abdomen: Bowel sounds are normal; Abdomen is Soft; No hepatosplenomegaly, masses or hernias;  No Abdominal Tenderness; No guarding or rebound tenderness. Neuro: Alert and oriented x 3.  Grossly intact.  Psych: Alert and cooperative, anxious mood and affect.   Imaging Studies: No results found.  Labs: CBC    Component Value Date/Time   WBC 6.0 06/30/2024 1056   RBC 4.32 06/30/2024 1056   HGB 12.4 06/30/2024 1056   HGB 10.7 (L) 03/14/2024 0932   HGB 13.5 09/10/2015 0755  HCT 37.7 06/30/2024 1056   HCT 33.4 (L) 03/14/2024 0932   HCT 40.9 09/10/2015 0755   PLT 209.0 06/30/2024 1056   PLT 249 03/14/2024 0932   MCV 87.4 06/30/2024 1056   MCV 90 03/14/2024 0932   MCV 90.9 09/10/2015 0755   MCH 28.7 03/14/2024 0932   MCH 28.3 11/14/2021 1225   MCHC 32.8 06/30/2024 1056   RDW 15.0 06/30/2024 1056   RDW 12.9 03/14/2024 0932   RDW 13.0 09/10/2015 0755   LYMPHSABS 1.5 06/30/2024 1056   LYMPHSABS 2.0 03/14/2024 0932   LYMPHSABS 2.1 09/10/2015 0755   MONOABS 0.4 06/30/2024 1056   MONOABS 0.5 09/10/2015 0755   EOSABS 0.1 06/30/2024 1056   EOSABS 0.1 03/14/2024 0932   EOSABS 0.1 03/19/2011 1504   BASOSABS 0.0 06/30/2024 1056   BASOSABS 0.1 03/14/2024 0932   BASOSABS 0.0 09/10/2015 0755    CMP     Component Value Date/Time   NA 138 03/14/2024 0932   NA 141 09/10/2015 0755   K 4.0 03/14/2024 0932   K 4.0 09/10/2015 0755   CL 104 03/14/2024 0932   CL 105 12/30/2012 1426   CO2 19 (L) 03/14/2024 0932   CO2 25 09/10/2015 0755   GLUCOSE 148 (H) 03/14/2024 0932   GLUCOSE 177 (H) 11/14/2021 1225   GLUCOSE 144 (H) 09/10/2015 0755   GLUCOSE 122 (H) 12/30/2012 1426   BUN 14 03/14/2024 0932   BUN  19.1 09/10/2015 0755   CREATININE 0.69 03/14/2024 0932   CREATININE 0.52 05/28/2016 1140   CREATININE 0.8 09/10/2015 0755   CALCIUM  9.4 03/14/2024 0932   CALCIUM  9.7 09/10/2015 0755   PROT 7.2 03/14/2024 0932   PROT 7.6 09/10/2015 0755   ALBUMIN 4.3 03/14/2024 0932   ALBUMIN 4.2 09/10/2015 0755   AST 28 03/14/2024 0932   AST 19 09/10/2015 0755   ALT 23 03/14/2024 0932   ALT 21 09/10/2015 0755   ALKPHOS 94 03/14/2024 0932   ALKPHOS 42 09/10/2015 0755   BILITOT 0.2 03/14/2024 0932   BILITOT 0.30 09/10/2015 0755   GFRNONAA >60 11/14/2021 1225   GFRAA 117 08/07/2020 0843    Assessment and Plan:   Rebecca Robertson is a 68 y.o. y/o female presents for:  1.  Irritable bowel syndrome, constipation predominant: Stable and symptoms have improved off Ozempic GLP-1 medication.  Abdominal pain has resolved and she is not taking dicyclomine . - Continue OTC Senokot - Recommend OTC MiraLAX if needed. - Recommend High Fiber diet with fruits, vegetables, and whole grains. - Drink 64 ounces of Fluids Daily.   2.  Exocrine pancreatic insufficiency - Continue Creon  36,000 lipase units with meals and snacks.  3.  New onset anemia.  Lab 03/14/2024 showed Hgb 10.7, HCT 33, MCV 90.  Baseline hemoglobin between 13 and 14 g.  Likely due to donating blood at the [facility].  Currently tolerating oral iron.  - Advised against blood donation. - Continue oral iron until levels normalize. - Labs: CBC, iron panel, ferritin, B12, folate. - If labs show iron deficiency, then repeat EGD and colonoscopy would be the next step.  4.  Colon cancer screening - 10-year repeat screening colonoscopy due 12/2027.  Ellouise Console, PA-C  Follow up in 6 months.  Also follow-up based on lab results.   "

## 2024-06-30 ENCOUNTER — Other Ambulatory Visit: Payer: Medicare (Managed Care)

## 2024-06-30 ENCOUNTER — Ambulatory Visit: Payer: Medicare (Managed Care) | Admitting: Physician Assistant

## 2024-06-30 ENCOUNTER — Encounter: Payer: Self-pay | Admitting: Physician Assistant

## 2024-06-30 VITALS — BP 110/68 | HR 68 | Ht 63.0 in | Wt 137.6 lb

## 2024-06-30 DIAGNOSIS — K8681 Exocrine pancreatic insufficiency: Secondary | ICD-10-CM | POA: Diagnosis not present

## 2024-06-30 DIAGNOSIS — D649 Anemia, unspecified: Secondary | ICD-10-CM

## 2024-06-30 DIAGNOSIS — K581 Irritable bowel syndrome with constipation: Secondary | ICD-10-CM | POA: Diagnosis not present

## 2024-06-30 DIAGNOSIS — K589 Irritable bowel syndrome without diarrhea: Secondary | ICD-10-CM

## 2024-06-30 LAB — CBC WITH DIFFERENTIAL/PLATELET
Basophils Absolute: 0 K/uL (ref 0.0–0.1)
Basophils Relative: 0.5 % (ref 0.0–3.0)
Eosinophils Absolute: 0.1 K/uL (ref 0.0–0.7)
Eosinophils Relative: 1.5 % (ref 0.0–5.0)
HCT: 37.7 % (ref 36.0–46.0)
Hemoglobin: 12.4 g/dL (ref 12.0–15.0)
Lymphocytes Relative: 25.4 % (ref 12.0–46.0)
Lymphs Abs: 1.5 K/uL (ref 0.7–4.0)
MCHC: 32.8 g/dL (ref 30.0–36.0)
MCV: 87.4 fl (ref 78.0–100.0)
Monocytes Absolute: 0.4 K/uL (ref 0.1–1.0)
Monocytes Relative: 7.3 % (ref 3.0–12.0)
Neutro Abs: 3.9 K/uL (ref 1.4–7.7)
Neutrophils Relative %: 65.3 % (ref 43.0–77.0)
Platelets: 209 K/uL (ref 150.0–400.0)
RBC: 4.32 Mil/uL (ref 3.87–5.11)
RDW: 15 % (ref 11.5–15.5)
WBC: 6 K/uL (ref 4.0–10.5)

## 2024-06-30 LAB — B12 AND FOLATE PANEL
Folate: 8.1 ng/mL (ref >5.9)
Vitamin B-12: 884 pg/mL (ref 211–911)

## 2024-06-30 NOTE — Patient Instructions (Signed)
 Your provider has requested that you go to the basement level for lab work before leaving today. Press B on the elevator. The lab is located at the first door on the left as you exit the elevator.  Please follow up sooner if symptoms increase or worsen  Due to recent changes in healthcare laws, you may see the results of your imaging and laboratory studies on MyChart before your provider has had a chance to review them.  We understand that in some cases there may be results that are confusing or concerning to you. Not all laboratory results come back in the same time frame and the provider may be waiting for multiple results in order to interpret others.  Please give us  48 hours in order for your provider to thoroughly review all the results before contacting the office for clarification of your results.   Thank you for trusting me with your gastrointestinal care!   Ellouise Console, PA-C _______________________________________________________  If your blood pressure at your visit was 140/90 or greater, please contact your primary care physician to follow up on this.  _______________________________________________________  If you are age 70 or older, your body mass index should be between 23-30. Your Body mass index is 24.37 kg/m. If this is out of the aforementioned range listed, please consider follow up with your Primary Care Provider.  If you are age 49 or younger, your body mass index should be between 19-25. Your Body mass index is 24.37 kg/m. If this is out of the aformentioned range listed, please consider follow up with your Primary Care Provider.   ________________________________________________________  The [facility] providers would like to encourage you to use MYCHART to communicate with providers for non-urgent requests or questions.  Due to long hold times on the telephone, sending your provider a message by [facility] may be a faster and more efficient way to get a response.   Please allow 48 business hours for a response.  Please remember that this is for non-urgent requests.  _______________________________________________________

## 2024-07-01 LAB — IRON,TIBC AND FERRITIN PANEL
%SAT: 15 % — ABNORMAL LOW (ref 16–45)
Ferritin: 15 ng/mL — ABNORMAL LOW (ref 16–288)
Iron: 54 ug/dL (ref 45–160)
TIBC: 351 ug/dL (ref 250–450)

## 2024-07-03 ENCOUNTER — Ambulatory Visit: Payer: Self-pay | Admitting: Physician Assistant

## 2024-07-03 DIAGNOSIS — D509 Iron deficiency anemia, unspecified: Secondary | ICD-10-CM

## 2024-07-08 ENCOUNTER — Other Ambulatory Visit: Payer: Self-pay | Admitting: Family Medicine

## 2024-07-08 DIAGNOSIS — F411 Generalized anxiety disorder: Secondary | ICD-10-CM

## 2024-07-13 NOTE — Progress Notes (Signed)
 Agree with the assessment and plan as outlined by Brigitte Canard, PA-C.

## 2024-07-16 ENCOUNTER — Other Ambulatory Visit: Payer: Self-pay | Admitting: Family Medicine

## 2024-07-16 DIAGNOSIS — F411 Generalized anxiety disorder: Secondary | ICD-10-CM

## 2024-08-14 ENCOUNTER — Ambulatory Visit: Payer: Medicare (Managed Care) | Admitting: Urology

## 2024-08-14 VITALS — BP 115/69 | HR 67 | Ht 63.0 in | Wt 137.0 lb

## 2024-08-14 DIAGNOSIS — N390 Urinary tract infection, site not specified: Secondary | ICD-10-CM

## 2024-08-14 LAB — URINALYSIS, COMPLETE
Bilirubin, UA: NEGATIVE
Ketones, UA: NEGATIVE
Nitrite, UA: NEGATIVE
Protein,UA: NEGATIVE
RBC, UA: NEGATIVE
Specific Gravity, UA: 1.03 (ref 1.005–1.030)
Urobilinogen, Ur: 0.2 mg/dL (ref 0.2–1.0)
pH, UA: 6 (ref 5.0–7.5)

## 2024-08-14 LAB — MICROSCOPIC EXAMINATION: Epithelial Cells (non renal): >10 /HPF — AB (ref 0–10)

## 2024-08-14 MED ORDER — OXYBUTYNIN CHLORIDE ER 10 MG PO TB24: 10.0000 mg | ORAL_TABLET | Freq: Every day | ORAL | 11 refills | Status: AC

## 2024-08-14 NOTE — Addendum Note (Signed)
 Addended by: CLYDELL GLATTER A on: 08/14/2024 10:40 AM   Modules accepted: Orders

## 2024-08-14 NOTE — Progress Notes (Signed)
 "  08/14/2024 10:05 AM   Rebecca Robertson 1955/10/17 [id number]  Referring provider: Gayle Saddie FALCON, PA-C [address] [place],  [place] [address]  Chief Complaint  Patient presents with   Follow-up    HPI: The patient was clinically doing very well on daily trimethoprim .  No infections by history.  In the last 10 days or so she was having some burning with intercourse and then some pink when she would wipe afterwards.  She has breast cancer and cannot use estrogen cream.  She uses K-Y jelly for vaginal dryness.  She does not have a history of gross hematuria.  She is on Omnicef for a skin rash.  She is continent.  When I examined her in the past she did not have a sling extrusion.  She never had cystoscopy because of mild meatal stenosis.  Clinically she did not have mesh erosion in the bladder or urethra.  Patient had a sling by myself in 2015.   Pelvic examination the patient has a narrow introitus.  She has no prolapse.  She had grade 1 hypermobility of the bladder neck and negative cough test.  I could examine her thoroughly and could not feel or see any sling extrusion.  She is very petite.  I could feel beneath the vaginal epithelium mesh at the left urethrovesical angle but with a Q-tip I could not see any or feel any extrusion.  I used a Q-tip.  I was very pleased with the examination.    Reassurance given.  She is petite.  She has some vaginal dryness.  I think this is the cause of the pink following intercourse.  She was having some burning.  I believe the symptoms associated with the same diagnosis.  Her breast cancer was in 2012.  Recommended to see her gynecologist for possible vaginal rejuvenation treatments that are not estrogen.  Perhaps she could speak to her medical oncologist regarding receptor type and safety of estrogen.  Burning and pink is only after intercourse.  No microscopic hematuria today   August 14, 2024 I have not seen the patient here for a  number of years.  Apparently [facility] does not take her insurance any longer.  She saw our extender recently and a positive urinary tract infection she just finished antibiotics.  She was having frequency and urge incontinence.  Was given Gemtesa  samples.  She was asked to stop oxybutynin .  Prescribed vaginal estrogen cream and given Augmentin .  Urine culture was negative.  She did not take the estrogen cream due to concern with her breast cancer diagnosis  Patient currently has urge incontinence.  She does not leak with coughing sneezing bending lifting.  She has moderate to severe bedwetting.  Incontinence has worsened over 6 to 12 months.  She still on oxybutynin  and never tried the Gemtesa .  She had been on the oxybutynin  for years.  She wears 3 pads a day that can be quite wet  She voids every 2 hours gets up 3 times at night.  Flow was good.  She feels empty  She has loose bowel movements from pancreatic issues.  She has not had a hysterectomy  She was cleared in the past for vaginal atrophy with no mesh erosion with some spotting with intercourse many years ago  She is on oral hypoglycemics and now a type of insulin.  Importantly her sugars apparently have been over 300 and they have been changing her diabetic medications a lot including Jardiance  On pelvic examination she had a narrow introitus and actually look quite healthy.  Her tissues had minimal atrophy.  She had a well supported bladder neck..  No stress incontinence with a moderate cough.   PMH: Past Medical History:  Diagnosis Date   Anxiety    Diabetes mellitus    FHx: pancreatic disease    History of breast cancer ONCOLOGIST-- DR FERNAND--  NO RECURRENCE   S/P LEFT PARTIAL MASTECTOMY W/ SLN BX'S  12-02-2010---  DUCTAL CARCINOMA IN SITU--  S/P RADIATION THERAPY ENDED 02-17-2011   Hyperlipidemia    Personal history of radiation therapy 2012   Pneumonia    PONV (postoperative nausea and vomiting)    Sprain of ankle  03/31/2023   SUI (stress urinary incontinence, female)    Urinary tract infection 02/25/2023   Vitamin D  deficiency    Vitamin D  insufficiency 03/04/2016    Surgical History: Past Surgical History:  Procedure Laterality Date   BLADDER SUSPENSION N/A 02/03/2013   Procedure: SPARC PROCEDURE;  Surgeon: Glendia DELENA Elizabeth, MD;  Location: [facility];  Service: Urology;  Laterality: N/A;   BREAST BIOPSY Right 2014   BREAST LUMPECTOMY Left 2012   BUNIONECTOMY     CESAREAN SECTION     X4   LEFT HEART CATH AND CORONARY ANGIOGRAPHY N/A 01/26/2018   Procedure: LEFT HEART CATH AND CORONARY ANGIOGRAPHY;  Surgeon: Darron Deatrice DELENA, MD;  Location: [facility];  Service: Cardiovascular;  Laterality: N/A;   MENISCUS REPAIR Left    PARTIAL MASTECTOMY WITH AXILLARY SENTINEL LYMPH NODE BIOPSY Left 12/02/2010    Home Medications:  Allergies as of 08/14/2024       Reactions   Compazine Other (See Comments)   Possible seizure, unable to talk, eyes rolled toward back of head, mouth became dry.   Dulaglutide Other (See Comments)   GI upset   Sitagliptin Phos-metformin Hcl Other (See Comments)   GI upset   Other Other (See Comments)   Tamoxifen Citrate Other (See Comments)   Aspirin  Other (See Comments)   bruising   Atorvastatin  Other (See Comments)   fatigue   Bydureon [exenatide]    Severe burning with injection   Liraglutide Other (See Comments)   GI upset        Medication List        Accurate as of August 14, 2024 10:05 AM. If you have any questions, ask your nurse or doctor.          Bayer Contour Next Test test strip Generic drug: glucose blood Check blood sugar daily   citalopram  20 MG tablet Commonly known as: CELEXA  Take 1 tablet by mouth once daily   Creon  36000-114000 units Cpep capsule Generic drug: lipase/protease/amylase Take 2 capsules (72,000 Units total) by mouth 3 (three) times daily before meals.   cyanocobalamin  1000 MCG  tablet Commonly known as: VITAMIN B12 Take 1,000 mcg by mouth every other day.   dicyclomine  20 MG tablet Commonly known as: BENTYL  Take 1 tablet (20 mg total) by mouth 3 (three) times daily before meals.   Embecta Pen Needle Nano 2 Gen 32G X 4 MM Misc Generic drug: Insulin Pen Needle Once a day; Duration: 90 days   estradiol  0.01 % Crea vaginal cream Commonly known as: ESTRACE  Apply one pea-sized amount around the opening of the urethra daily for 30 days, then 3 times weekly moving forward.   Gemtesa  75 MG Tabs Generic drug: Vibegron  Take 1 tablet (75 mg total) by mouth  daily.   Lactulose  20 GM/30ML Soln Take 30 mLs (20 g total) by mouth daily.   Lantus SoloStar 100 UNIT/ML Solostar Pen Generic drug: insulin glargine Inject 14 Units into the skin daily.   rosuvastatin  20 MG tablet Commonly known as: CRESTOR  Take 1 tablet (20 mg total) by mouth daily.   triamcinolone  0.025 % cream Commonly known as: KENALOG  Apply 1 Application topically 2 (two) times daily.        Allergies: Allergies[1]  Family History: Family History  Problem Relation Age of Onset   Cancer Mother        lung   Heart attack Father    COPD Father    Heart disease Father     Social History:  reports that she has never smoked. She has never used smokeless tobacco. She reports that she does not drink alcohol and does not use drugs.  ROS:                                        Physical Exam: There were no vitals taken for this visit.  Constitutional:  Alert and oriented, No acute distress. HEENT: [place] AT, moist mucus membranes.  Trachea midline, no masses.   Laboratory Data: Lab Results  Component Value Date   WBC 6.0 06/30/2024   HGB 12.4 06/30/2024   HCT 37.7 06/30/2024   MCV 87.4 06/30/2024   PLT 209.0 06/30/2024    Lab Results  Component Value Date   CREATININE 0.69 03/14/2024    No results found for: PSA  No results found for:  TESTOSTERONE  Lab Results  Component Value Date   HGBA1C 7.9 (H) 03/14/2024    Urinalysis    Component Value Date/Time   COLORURINE YELLOW 11/14/2021 1430   APPEARANCEUR Clear 05/31/2024 1046   LABSPEC >1.046 (H) 11/14/2021 1430   PHURINE 5.5 11/14/2021 1430   GLUCOSEU 3+ (A) 05/31/2024 1046   HGBUR NEGATIVE 11/14/2021 1430   BILIRUBINUR Negative 05/31/2024 1046   KETONESUR negative 05/25/2024 1103   KETONESUR NEGATIVE 11/14/2021 1430   PROTEINUR Negative 05/31/2024 1046   PROTEINUR TRACE (A) 11/14/2021 1430   UROBILINOGEN 0.2 05/25/2024 1103   NITRITE Negative 05/31/2024 1046   NITRITE NEGATIVE 11/14/2021 1430   LEUKOCYTESUR Negative 05/31/2024 1046   LEUKOCYTESUR NEGATIVE 11/14/2021 1430    Pertinent Imaging: Urine reviewed and sent for culture.  No blood in urine.  Assessment & Plan: Patient primarily has urge incontinence bedwetting.  She has had a previous sling.  Call if urine culture positive.  Role of urodynamics and cystoscopy discussed.  Role of getting her sugars under control discussed and this may be the primary issue.  Patient is going to try to get her sugars under control.  If this fails she will call and I will order urodynamics in [facility] due to insurance.  She agreed with the plan.  I do believe that the diabetes issue is likely overwhelmed her overactive bladder  1. Recurrent UTI (Primary)  - Urinalysis, Complete - Urinalysis, Complete   No follow-ups on file.  Glendia DELENA Elizabeth, MD  [facility] [address] [place], [place] [address] [phone]     [1]  Allergies Allergen Reactions   Compazine Other (See Comments)    Possible seizure, unable to talk, eyes rolled toward back of head, mouth became dry.   Dulaglutide Other (See Comments)    GI  upset   Sitagliptin Phos-Metformin Hcl Other (See Comments)    GI upset   Other Other (See Comments)   Tamoxifen Citrate Other (See Comments)    Aspirin  Other (See Comments)    bruising   Atorvastatin  Other (See Comments)    fatigue   Bydureon [Exenatide]     Severe burning with injection   Liraglutide Other (See Comments)    GI upset   "

## 2024-08-18 ENCOUNTER — Other Ambulatory Visit: Payer: Medicare (Managed Care)

## 2024-08-18 DIAGNOSIS — D509 Iron deficiency anemia, unspecified: Secondary | ICD-10-CM

## 2024-08-18 LAB — CBC WITH DIFFERENTIAL/PLATELET
Basophils Absolute: 0 K/uL (ref 0.0–0.1)
Basophils Relative: 0.4 % (ref 0.0–3.0)
Eosinophils Absolute: 0.1 K/uL (ref 0.0–0.7)
Eosinophils Relative: 1.6 % (ref 0.0–5.0)
HCT: 34.9 % — ABNORMAL LOW (ref 36.0–46.0)
Hemoglobin: 11.6 g/dL — ABNORMAL LOW (ref 12.0–15.0)
Lymphocytes Relative: 28.2 % (ref 12.0–46.0)
Lymphs Abs: 1.7 K/uL (ref 0.7–4.0)
MCHC: 33.4 g/dL (ref 30.0–36.0)
MCV: 88.2 fl (ref 78.0–100.0)
Monocytes Absolute: 0.5 K/uL (ref 0.1–1.0)
Monocytes Relative: 8 % (ref 3.0–12.0)
Neutro Abs: 3.8 K/uL (ref 1.4–7.7)
Neutrophils Relative %: 61.8 % (ref 43.0–77.0)
Platelets: 175 K/uL (ref 150.0–400.0)
RBC: 3.95 Mil/uL (ref 3.87–5.11)
RDW: 13.6 % (ref 11.5–15.5)
WBC: 6.2 K/uL (ref 4.0–10.5)

## 2024-08-19 LAB — IRON,TIBC AND FERRITIN PANEL
%SAT: 22 % (ref 16–45)
Ferritin: 10 ng/mL — ABNORMAL LOW (ref 16–288)
Iron: 72 ug/dL (ref 45–160)
TIBC: 328 ug/dL (ref 250–450)

## 2024-08-21 ENCOUNTER — Telehealth: Payer: Self-pay

## 2024-08-21 DIAGNOSIS — D509 Iron deficiency anemia, unspecified: Secondary | ICD-10-CM

## 2024-08-21 NOTE — Progress Notes (Signed)
-   I sent patient lab results through MyChart. - Please call patient and schedule upper endoscopy and colonoscopy to further evaluate iron deficiency anemia.  Any MD in pod A (except not Dr. Abran). Ellouise Console, PA-C

## 2024-08-21 NOTE — Telephone Encounter (Signed)
 Pt was informed that UA only had epithelial cells so no antibiotic needed. Pt was informed if she develops any Uti symptoms she can let us  know and she can do a UA drop off per Dr.MacDiarmid. pt voiced understanding.

## 2024-08-22 MED ORDER — NA SULFATE-K SULFATE-MG SULF 17.5-3.13-1.6 GM/177ML PO SOLN: ORAL | 0 refills | Status: DC

## 2024-08-22 NOTE — Telephone Encounter (Signed)
 Spoke to patient to advise of the recommendations to move forward with endoscopy & colonoscopy for further evaluation of IDA. Patient is in agreement with this. Patient has scheduled endo/colon in [facility] on 09/13/24 at @ 2 PM. Patient has been advised of time/date/location for upcoming procedure and has been given generalized verbal prep instructions. Discussed that a care partner 18 years or older should bring her, stay for the procedure and drive home due to sedation. Written instructions have been made available to the patient for additional review in MyChart.

## 2024-08-22 NOTE — Telephone Encounter (Signed)
 Patient returned call, please advise.

## 2024-08-23 MED ORDER — NA SULFATE-K SULFATE-MG SULF 17.5-3.13-1.6 GM/177ML PO SOLN: ORAL | 0 refills | Status: DC

## 2024-08-23 NOTE — Telephone Encounter (Signed)
 PT is calling back in reference to the price of the prep medication and wants to know if we have any samples or coupons. Please advise.

## 2024-08-23 NOTE — Addendum Note (Signed)
 Addended by: Nikki Glanzer D on: 08/23/2024 03:12 PM   Modules accepted: Orders

## 2024-08-23 NOTE — Telephone Encounter (Signed)
 Patient went to pick up the Suprep however it was $52. After reviewing the [facility] is ~$[facility]. [facility] [address]. Patient will have her husband pull up [facility] on her phone. Prescription sent. Patient will call back if any issues.

## 2024-09-13 ENCOUNTER — Ambulatory Visit: Payer: Medicare (Managed Care) | Admitting: Gastroenterology

## 2024-09-13 ENCOUNTER — Encounter: Payer: Self-pay | Admitting: Gastroenterology

## 2024-09-13 VITALS — BP 164/86 | HR 54 | Temp 97.2°F | Resp 16 | Ht 63.0 in | Wt 137.0 lb

## 2024-09-13 DIAGNOSIS — D509 Iron deficiency anemia, unspecified: Secondary | ICD-10-CM | POA: Diagnosis not present

## 2024-09-13 DIAGNOSIS — K449 Diaphragmatic hernia without obstruction or gangrene: Secondary | ICD-10-CM | POA: Diagnosis not present

## 2024-09-13 DIAGNOSIS — K2950 Unspecified chronic gastritis without bleeding: Secondary | ICD-10-CM | POA: Diagnosis not present

## 2024-09-13 MED ORDER — SODIUM CHLORIDE 0.9 % IV SOLN: 500.0000 mL | Freq: Once | INTRAVENOUS | Status: DC

## 2024-09-13 NOTE — Progress Notes (Signed)
 [facility] History and Physical   Primary Care Physician:  Gayle Saddie FALCON, PA-C   Reason for Procedure:   Iron deficiency anemia  Plan:    EGD, colonoscopy  The patient was provided an opportunity to ask questions and all were answered. The patient agreed with the plan    HPI: Rebecca Robertson is a 69 y.o. female undergoing EGD and colonoscopy to evaluate new iron deficiency anemia.  She has no family history of colon cancer.  She denies any overt GI Bleeding.  She has IBS-C which is currently well controlled.    Past Medical History:  Diagnosis Date   Anxiety    Cancer (HCC)    Diabetes mellitus    FHx: pancreatic disease    History of breast cancer ONCOLOGIST-- DR FERNAND--  NO RECURRENCE   S/P LEFT PARTIAL MASTECTOMY W/ SLN BX'S  12-02-2010---  DUCTAL CARCINOMA IN SITU--  S/P RADIATION THERAPY ENDED 02-17-2011   Hyperlipidemia    Personal history of radiation therapy 2012   Pneumonia    PONV (postoperative nausea and vomiting)    Sprain of ankle 03/31/2023   SUI (stress urinary incontinence, female)    Urinary tract infection 02/25/2023   Vitamin D  deficiency    Vitamin D  insufficiency 03/04/2016    Past Surgical History:  Procedure Laterality Date   BLADDER SUSPENSION N/A 02/03/2013   Procedure: SPARC PROCEDURE;  Surgeon: Glendia DELENA Elizabeth, MD;  Location: [facility];  Service: Urology;  Laterality: N/A;   BREAST BIOPSY Right 2014   BREAST LUMPECTOMY Left 2012   BUNIONECTOMY     CESAREAN SECTION     X4   LEFT HEART CATH AND CORONARY ANGIOGRAPHY N/A 01/26/2018   Procedure: LEFT HEART CATH AND CORONARY ANGIOGRAPHY;  Surgeon: Darron Deatrice DELENA, MD;  Location: [facility];  Service: Cardiovascular;  Laterality: N/A;   MENISCUS REPAIR Left    PARTIAL MASTECTOMY WITH AXILLARY SENTINEL LYMPH NODE BIOPSY Left 12/02/2010    Prior to Admission medications  Medication Sig Start Date End Date Taking? Authorizing Provider  BAYER CONTOUR  NEXT TEST test strip Check blood sugar daily 01/18/16  Yes [provider]  citalopram  (CELEXA ) 20 MG tablet Take 1 tablet by mouth once daily 07/10/24  Yes Clapp, Kara F, PA-C  CREON  36000-114000 units CPEP capsule Take 2 capsules (72,000 Units total) by mouth 3 (three) times daily before meals. 10/19/23 10/13/24 Yes Honora Ellouise, PA-C  dicyclomine  (BENTYL ) 20 MG tablet Take 1 tablet (20 mg total) by mouth 3 (three) times daily before meals. 10/19/23 10/13/24 Yes Honora Ellouise, PA-C  EMBECTA PEN NEEDLE NANO 2 GEN 32G X 4 MM MISC Once a day; Duration: 90 days 06/20/24  Yes [provider]  LANTUS SOLOSTAR 100 UNIT/ML Solostar Pen Inject 14 Units into the skin daily. 06/20/24  Yes [provider]  oxybutynin  (DITROPAN -XL) 10 MG 24 hr tablet Take 1 tablet (10 mg total) by mouth daily. 08/14/24   Elizabeth Glendia, MD  rosuvastatin  (CRESTOR ) 20 MG tablet Take 1 tablet (20 mg total) by mouth daily. 03/21/24   Gayle Saddie FALCON, PA-C  triamcinolone  (KENALOG ) 0.025 % cream Apply 1 Application topically 2 (two) times daily. 09/07/22   Boscia, Heather E, NP  vitamin B-12 (CYANOCOBALAMIN ) 1000 MCG tablet Take 1,000 mcg by mouth every other day.    [provider]    Current Outpatient Medications  Medication Sig Dispense Refill   BAYER CONTOUR NEXT TEST test strip Check blood sugar daily  5   citalopram  (CELEXA ) 20 MG tablet Take 1 tablet by mouth once daily 90 tablet 0   CREON  36000-114000 units CPEP capsule Take 2 capsules (72,000 Units total) by mouth 3 (three) times daily before meals. 180 capsule 11   dicyclomine  (BENTYL ) 20 MG tablet Take 1 tablet (20 mg total) by mouth 3 (three) times daily before meals. 270 tablet 3   EMBECTA PEN NEEDLE NANO 2 GEN 32G X 4 MM MISC Once a day; Duration: 90 days     LANTUS SOLOSTAR 100 UNIT/ML Solostar Pen Inject 14 Units into the skin daily.     oxybutynin  (DITROPAN -XL) 10 MG 24 hr tablet Take 1 tablet (10 mg total) by mouth daily. 30 tablet 11    rosuvastatin  (CRESTOR ) 20 MG tablet Take 1 tablet (20 mg total) by mouth daily. 90 tablet 2   triamcinolone  (KENALOG ) 0.025 % cream Apply 1 Application topically 2 (two) times daily. 80 g 2   vitamin B-12 (CYANOCOBALAMIN ) 1000 MCG tablet Take 1,000 mcg by mouth every other day.     Current Facility-Administered Medications  Medication Dose Route Frequency Provider Last Rate Last Admin   0.9 %  sodium chloride  infusion  500 mL Intravenous Once Stacia Glendia BRAVO, MD        Allergies as of 09/13/2024 - Review Complete 09/13/2024  Allergen Reaction Noted   Compazine Other (See Comments) 01/29/2011   Dulaglutide Other (See Comments) 03/15/2020   Sitagliptin phos-metformin hcl Other (See Comments) 03/15/2020   Other Other (See Comments) 06/30/2024   Aspirin  Other (See Comments) 03/15/2020   Atorvastatin  Other (See Comments) 03/15/2020   Bydureon [exenatide]  11/09/2016   Liraglutide Other (See Comments) 03/15/2020   Tamoxifen citrate Other (See Comments) 03/15/2020    Family History  Problem Relation Age of Onset   Cancer Mother        lung   Heart attack Father    COPD Father    Heart disease Father     Social History   Socioeconomic History   Marital status: Married    Spouse name: Not on file   Number of children: Not on file   Years of education: Not on file   Highest education level: Not on file  Occupational History   Not on file  Tobacco Use   Smoking status: Never   Smokeless tobacco: Never  Vaping Use   Vaping status: Never Used  Substance and Sexual Activity   Alcohol use: No   Drug use: No   Sexual activity: Not on file  Other Topics Concern   Not on file  Social History Narrative   Not on file   Social Drivers of Health   Tobacco Use: Low Risk (09/13/2024)   Patient History    Smoking Tobacco Use: Never    Smokeless Tobacco Use: Never    Passive Exposure: Not on file  Financial Resource Strain: Not on file  Food Insecurity: No Food Insecurity  (05/25/2024)   Epic    Worried About Programme Researcher, Broadcasting/film/video in the Last Year: Never true    Ran Out of Food in the Last Year: Never true  Transportation Needs: No Transportation Needs (05/25/2024)   Epic    Lack of Transportation (Medical): No    Lack of Transportation (Non-Medical): No  Physical Activity: Not on file  Stress: Not on file  Social Connections: Not on file  Intimate Partner Violence: Not At Risk (05/25/2024)   Epic    Fear of Current or Ex-Partner:  No    Emotionally Abused: No    Physically Abused: No    Sexually Abused: No  Depression (PHQ2-9): Low Risk (03/21/2024)   Depression (PHQ2-9)    PHQ-2 Score: 0  Alcohol Screen: Not on file  Housing: Unknown (05/25/2024)   Epic    Unable to Pay for Housing in the Last Year: No    Number of Times Moved in the Last Year: Not on file    Homeless in the Last Year: No  Utilities: Not At Risk (05/25/2024)   Epic    Threatened with loss of utilities: No  Health Literacy: Not on file    Review of Systems:  All other review of systems negative except as mentioned in the HPI.  Physical Exam: Vital signs BP 113/60   Pulse 60   Temp (!) 97.2 F (36.2 C)   Ht 5' 3 (1.6 m)   Wt 137 lb (62.1 kg)   SpO2 100%   BMI 24.27 kg/m   General:   Alert,  Well-developed, well-nourished, pleasant and cooperative in NAD Airway:  Mallampati 2 Lungs:  Clear throughout to auscultation.   Heart:  Regular rate and rhythm; no murmurs, clicks, rubs,  or gallops. Abdomen:  Soft, nontender and nondistended. Normal bowel sounds.   Neuro/Psych:  Normal mood and affect. A and O x 3   Danah Reinecke E. Stacia, MD [facility]

## 2024-09-13 NOTE — Progress Notes (Signed)
 Called to room to assist during endoscopic procedure.  Patient ID and intended procedure confirmed with present staff. Received instructions for my participation in the procedure from the performing physician.

## 2024-09-13 NOTE — Op Note (Signed)
 [facility] Patient Name: Rebecca Robertson Procedure Date: 09/13/2024 3:17 PM MRN: [id number] Endoscopist: Glendia E. Stacia , MD, [id number] Age: 69 Referring MD:  Date of Birth: 04-06-1956 Gender: Female Account #: [id number] Procedure:                Upper GI endoscopy Indications:              Iron deficiency anemia Medicines:                Monitored Anesthesia Care Procedure:                Pre-Anesthesia Assessment:                           - Prior to the procedure, a History and Physical                            was performed, and patient medications and                            allergies were reviewed. The patient's tolerance of                            previous anesthesia was also reviewed. The risks                            and benefits of the procedure and the sedation                            options and risks were discussed with the patient.                            All questions were answered, and informed consent                            was obtained. Prior Anticoagulants: The patient has                            taken no anticoagulant or antiplatelet agents. ASA                            Grade Assessment: II - A patient with mild systemic                            disease. After reviewing the risks and benefits,                            the patient was deemed in satisfactory condition to                            undergo the procedure.                           After obtaining informed consent, the endoscope was  passed under direct vision. Throughout the                            procedure, the patient's blood pressure, pulse, and                            oxygen saturations were monitored continuously. The                            Olympus scope [id number] was introduced through the                            mouth, and advanced to the second part of duodenum.                            The upper GI endoscopy  was accomplished without                            difficulty. The patient tolerated the procedure                            fairly well, although she experienced transient                            laryngospasm resulting in low oxygen saturations                            that prompted withdrawal of the endoscope.                            Laryngospasm resolved with suction/jaw                            thrust/additional sedation and the patient's                            oxygenation normalized within seconds and the                            endoscope was reinserted and the procedure was                            completed. Scope In: Scope Out: Findings:                 The examined portions of the nasopharynx,                            oropharynx and larynx were normal.                           The examined esophagus was normal.                           A 6 cm hiatal hernia was present.  The entire examined stomach was normal. Biopsies                            were taken with a cold forceps for Helicobacter                            pylori testing. Estimated blood loss was minimal.                           The examined duodenum was normal. Biopsies for                            histology were taken with a cold forceps for                            evaluation of celiac disease. Estimated blood loss                            was minimal. Complications:            No immediate complications. Estimated Blood Loss:     Estimated blood loss was minimal. Impression:               - The examined portions of the nasopharynx,                            oropharynx and larynx were normal.                           - Normal esophagus.                           - 6 cm hiatal hernia. Although no Cameron's lesions                            seen on today's exam, this could be a possible                            source of transient blood loss                            - Normal stomach. Biopsied.                           - Normal examined duodenum. Biopsied. Recommendation:           - Patient has a contact number available for                            emergencies. The signs and symptoms of potential                            delayed complications were discussed with the                            patient. Return to normal activities  tomorrow.                            Written discharge instructions were provided to the                            patient.                           - Resume previous diet.                           - Continue present medications.                           - Await pathology results. Helen Cuff E. Stacia, MD 09/13/2024 4:15:00 PM This report has been signed electronically.

## 2024-09-13 NOTE — Patient Instructions (Signed)

## 2024-09-13 NOTE — Op Note (Signed)
 [facility] Patient Name: Rebecca Robertson Procedure Date: 09/13/2024 3:16 PM MRN: [id number] Endoscopist: Glendia E. Stacia , MD, [id number] Age: 69 Referring MD:  Date of Birth: September 12, 1955 Gender: Female Account #: [id number] Procedure:                Colonoscopy Indications:              Iron deficiency anemia Medicines:                Monitored Anesthesia Care Procedure:                Pre-Anesthesia Assessment:                           - Prior to the procedure, a History and Physical                            was performed, and patient medications and                            allergies were reviewed. The patient's tolerance of                            previous anesthesia was also reviewed. The risks                            and benefits of the procedure and the sedation                            options and risks were discussed with the patient.                            All questions were answered, and informed consent                            was obtained. Prior Anticoagulants: The patient has                            taken no anticoagulant or antiplatelet agents. ASA                            Grade Assessment: II - A patient with mild systemic                            disease. After reviewing the risks and benefits,                            the patient was deemed in satisfactory condition to                            undergo the procedure.                           After obtaining informed consent, the colonoscope  was passed under direct vision. Throughout the                            procedure, the patient's blood pressure, pulse, and                            oxygen saturations were monitored continuously. The                            Olympus Scope [id number] was introduced through the                            anus and advanced to the the terminal ileum, with                            identification of the  appendiceal orifice and IC                            valve. The colonoscopy was performed without                            difficulty. The patient tolerated the procedure                            well. The quality of the bowel preparation was                            adequate. There was extensive of amount liquid                            brown stool which required copious lavage and                            suction, but adequate visualization was achieved.                            The terminal ileum, ileocecal valve, appendiceal                            orifice, and rectum were photographed. Scope In: 3:43:49 PM Scope Out: 4:06:55 PM Scope Withdrawal Time: 0 hours 12 minutes 24 seconds  Total Procedure Duration: 0 hours 23 minutes 6 seconds  Findings:                 The perianal and digital rectal examinations were                            normal. Pertinent negatives include normal                            sphincter tone and no palpable rectal lesions.                           The colon (entire examined portion) appeared normal.  The terminal ileum appeared normal.                           The retroflexed view of the distal rectum and anal                            verge was normal and showed no anal or rectal                            abnormalities. Complications:            No immediate complications. Estimated Blood Loss:     Estimated blood loss: none. Impression:               - The entire examined colon is normal.                           - The examined portion of the ileum was normal.                           - The distal rectum and anal verge are normal on                            retroflexion view.                           - No specimens collected.                           - No abnormalities to explain iron deficiency                            anemia. Recommendation:           - Patient has a contact number available for                             emergencies. The signs and symptoms of potential                            delayed complications were discussed with the                            patient. Return to normal activities tomorrow.                            Written discharge instructions were provided to the                            patient.                           - Resume previous diet.                           - Continue present medications.                           -  Recommend against further colon cancer screening                            given patient's age and lack of polyps. Rebecca Robertson E. Stacia, MD 09/13/2024 4:18:58 PM This report has been signed electronically.

## 2024-09-13 NOTE — Progress Notes (Signed)
 Transferred to PACU via stretcher.  Not responding to stimulation at this time.  VSS upon leaving procedure room.

## 2024-09-14 ENCOUNTER — Telehealth: Payer: Self-pay

## 2024-09-14 NOTE — Telephone Encounter (Signed)
" °  Follow up Call-     09/13/2024    2:41 PM  Call back number  Post procedure Call Back phone  # [phone]  Permission to leave phone message Yes     Patient questions:  Do you have a fever, pain , or abdominal swelling? No. Pain Score  0 *  Have you tolerated food without any problems? Yes.    Have you been able to return to your normal activities? Yes.    Do you have any questions about your discharge instructions: Diet   No. Medications  No. Follow up visit  No.  Do you have questions or concerns about your Care? No.  Actions: * If pain score is 4 or above: No action needed, pain <4.   "

## 2024-09-18 LAB — SURGICAL PATHOLOGY

## 2024-09-19 ENCOUNTER — Ambulatory Visit: Payer: Self-pay | Admitting: Gastroenterology

## 2024-10-05 ENCOUNTER — Ambulatory Visit: Payer: Medicare (Managed Care)

## 2025-03-15 ENCOUNTER — Other Ambulatory Visit: Payer: Medicare (Managed Care)

## 2025-08-13 ENCOUNTER — Ambulatory Visit: Payer: Medicare (Managed Care) | Admitting: Urology
